# Patient Record
Sex: Female | Born: 1950
Health system: Southern US, Community
[De-identification: ages and names within clinical notes are randomized; demographics above are authoritative.]

## PROBLEM LIST (undated history)

## (undated) DIAGNOSIS — K5792 Diverticulitis of intestine, part unspecified, without perforation or abscess without bleeding: Secondary | ICD-10-CM

## (undated) DIAGNOSIS — M1990 Unspecified osteoarthritis, unspecified site: Secondary | ICD-10-CM

## (undated) DIAGNOSIS — R079 Chest pain, unspecified: Secondary | ICD-10-CM

## (undated) DIAGNOSIS — I447 Left bundle-branch block, unspecified: Secondary | ICD-10-CM

## (undated) DIAGNOSIS — T7840XA Allergy, unspecified, initial encounter: Secondary | ICD-10-CM

## (undated) DIAGNOSIS — M549 Dorsalgia, unspecified: Secondary | ICD-10-CM

## (undated) DIAGNOSIS — I2510 Atherosclerotic heart disease of native coronary artery without angina pectoris: Secondary | ICD-10-CM

## (undated) DIAGNOSIS — M160 Bilateral primary osteoarthritis of hip: Secondary | ICD-10-CM

## (undated) DIAGNOSIS — M2550 Pain in unspecified joint: Secondary | ICD-10-CM

## (undated) DIAGNOSIS — E7800 Pure hypercholesterolemia, unspecified: Secondary | ICD-10-CM

## (undated) DIAGNOSIS — I10 Essential (primary) hypertension: Secondary | ICD-10-CM

## (undated) DIAGNOSIS — Z8719 Personal history of other diseases of the digestive system: Secondary | ICD-10-CM

## (undated) HISTORY — PX: PARTIAL HYSTERECTOMY: SHX80

## (undated) HISTORY — DX: Allergy, unspecified, initial encounter: T78.40XA

## (undated) HISTORY — DX: Pure hypercholesterolemia, unspecified: E78.00

## (undated) HISTORY — DX: Atherosclerotic heart disease of native coronary artery without angina pectoris: I25.10

## (undated) HISTORY — PX: ROTATOR CUFF REPAIR: SHX139

## (undated) HISTORY — PX: FRACTURE SURGERY: SHX138

## (undated) HISTORY — DX: Chest pain, unspecified: R07.9

## (undated) HISTORY — PX: CHOLECYSTECTOMY: SHX55

## (undated) HISTORY — DX: Essential (primary) hypertension: I10

## (undated) HISTORY — DX: Pain in unspecified joint: M25.50

## (undated) HISTORY — DX: Unspecified osteoarthritis, unspecified site: M19.90

## (undated) HISTORY — DX: Dorsalgia, unspecified: M54.9

## (undated) HISTORY — PX: TONSILLECTOMY: SUR1361

## (undated) HISTORY — DX: Diverticulitis of intestine, part unspecified, without perforation or abscess without bleeding: K57.92

---

## 2000-12-26 ENCOUNTER — Encounter: Admission: RE | Admit: 2000-12-26 | Discharge: 2001-03-26 | Payer: Self-pay | Admitting: Family Medicine

## 2004-04-06 ENCOUNTER — Encounter: Admission: RE | Admit: 2004-04-06 | Discharge: 2004-04-06 | Payer: Self-pay | Admitting: Family Medicine

## 2005-02-05 ENCOUNTER — Ambulatory Visit: Payer: Self-pay | Admitting: Internal Medicine

## 2005-02-09 ENCOUNTER — Ambulatory Visit: Payer: Self-pay | Admitting: Family Medicine

## 2005-02-14 ENCOUNTER — Ambulatory Visit: Payer: Self-pay | Admitting: Family Medicine

## 2007-03-13 ENCOUNTER — Ambulatory Visit: Payer: Self-pay | Admitting: Family Medicine

## 2007-03-14 ENCOUNTER — Ambulatory Visit: Payer: Self-pay | Admitting: Internal Medicine

## 2007-03-14 ENCOUNTER — Ambulatory Visit: Payer: Self-pay | Admitting: Family Medicine

## 2007-03-14 LAB — CONVERTED CEMR LAB
ALT: 21 units/L (ref 0–35)
AST: 20 units/L (ref 0–37)
CO2: 30 meq/L (ref 19–32)
Chloride: 107 meq/L (ref 96–112)
Cholesterol: 180 mg/dL (ref 0–200)
Creatinine, Ser: 0.6 mg/dL (ref 0.4–1.2)
Eosinophils Absolute: 0.1 10*3/uL (ref 0.0–0.6)
Hemoglobin: 13.4 g/dL (ref 12.0–15.0)
Ketones, ur: NEGATIVE mg/dL
MCHC: 34.7 g/dL (ref 30.0–36.0)
Monocytes Absolute: 0.7 10*3/uL (ref 0.2–0.7)
Monocytes Relative: 13.9 % — ABNORMAL HIGH (ref 3.0–11.0)
Nitrite: NEGATIVE
Platelets: 163 10*3/uL (ref 150–400)
Potassium: 3.9 meq/L (ref 3.5–5.1)
RDW: 12 % (ref 11.5–14.6)
Specific Gravity, Urine: 1.01 (ref 1.000–1.03)
TSH: 0.96 microintl units/mL (ref 0.35–5.50)
Total Bilirubin: 1.2 mg/dL (ref 0.3–1.2)
Total Protein, Urine: NEGATIVE mg/dL
Triglycerides: 69 mg/dL (ref 0–149)
Urobilinogen, UA: 0.2 (ref 0.0–1.0)
WBC: 5.2 10*3/uL (ref 4.5–10.5)

## 2007-03-17 ENCOUNTER — Ambulatory Visit: Payer: Self-pay | Admitting: Family Medicine

## 2007-03-17 DIAGNOSIS — K5730 Diverticulosis of large intestine without perforation or abscess without bleeding: Secondary | ICD-10-CM

## 2007-03-24 DIAGNOSIS — I10 Essential (primary) hypertension: Secondary | ICD-10-CM | POA: Insufficient documentation

## 2007-04-01 ENCOUNTER — Ambulatory Visit: Payer: Self-pay | Admitting: Family Medicine

## 2007-04-01 DIAGNOSIS — R1011 Right upper quadrant pain: Secondary | ICD-10-CM

## 2007-04-16 ENCOUNTER — Telehealth (INDEPENDENT_AMBULATORY_CARE_PROVIDER_SITE_OTHER): Payer: Self-pay | Admitting: *Deleted

## 2007-04-16 ENCOUNTER — Telehealth: Payer: Self-pay | Admitting: Family Medicine

## 2007-04-21 ENCOUNTER — Telehealth: Payer: Self-pay | Admitting: Family Medicine

## 2007-06-27 ENCOUNTER — Encounter: Payer: Self-pay | Admitting: Family Medicine

## 2007-12-01 ENCOUNTER — Telehealth (INDEPENDENT_AMBULATORY_CARE_PROVIDER_SITE_OTHER): Payer: Self-pay | Admitting: *Deleted

## 2008-02-25 ENCOUNTER — Ambulatory Visit: Payer: Self-pay | Admitting: Family Medicine

## 2008-02-25 LAB — CONVERTED CEMR LAB
ALT: 22 units/L (ref 0–35)
AST: 20 units/L (ref 0–37)
Albumin: 4.3 g/dL (ref 3.5–5.2)
Alkaline Phosphatase: 107 units/L (ref 39–117)
BUN: 19 mg/dL (ref 6–23)
Basophils Relative: 0.3 % (ref 0.0–1.0)
Bilirubin, Direct: 0.1 mg/dL (ref 0.0–0.3)
CO2: 28 meq/L (ref 19–32)
Calcium: 9.4 mg/dL (ref 8.4–10.5)
Creatinine, Ser: 0.8 mg/dL (ref 0.4–1.2)
Eosinophils Absolute: 0 10*3/uL (ref 0.0–0.7)
GFR calc non Af Amer: 79 mL/min
HDL: 47.8 mg/dL (ref >39.0)
Hemoglobin: 13.9 g/dL (ref 12.0–15.0)
Nitrite: NEGATIVE
Platelets: 160 10*3/uL (ref 150–400)
Protein, U semiquant: NEGATIVE
RBC: 4.41 M/uL (ref 3.87–5.11)
Specific Gravity, Urine: 1.025
Total Bilirubin: 1 mg/dL (ref 0.3–1.2)
Urobilinogen, UA: 0.2
VLDL: 13 mg/dL (ref 0–40)
pH: 5

## 2008-03-09 ENCOUNTER — Ambulatory Visit: Payer: Self-pay | Admitting: Family Medicine

## 2008-07-28 ENCOUNTER — Telehealth: Payer: Self-pay | Admitting: Family Medicine

## 2009-01-06 ENCOUNTER — Telehealth: Payer: Self-pay | Admitting: Family Medicine

## 2009-03-04 ENCOUNTER — Ambulatory Visit: Payer: Self-pay | Admitting: Family Medicine

## 2009-03-04 LAB — CONVERTED CEMR LAB
ALT: 20 units/L (ref 0–35)
Albumin: 4 g/dL (ref 3.5–5.2)
BUN: 13 mg/dL (ref 6–23)
Basophils Relative: 0.4 % (ref 0.0–3.0)
Bilirubin Urine: NEGATIVE
Blood in Urine, dipstick: NEGATIVE
Direct LDL: 127.3 mg/dL
Eosinophils Absolute: 0 10*3/uL (ref 0.0–0.7)
Eosinophils Relative: 1.1 % (ref 0.0–5.0)
GFR calc non Af Amer: 91.58 mL/min (ref >60)
Glucose, Bld: 89 mg/dL (ref 70–99)
Glucose, Urine, Semiquant: NEGATIVE
HDL: 53.4 mg/dL (ref >39.00)
Lymphocytes Relative: 48.6 % — ABNORMAL HIGH (ref 12.0–46.0)
Lymphs Abs: 1.8 10*3/uL (ref 0.7–4.0)
MCHC: 35 g/dL (ref 30.0–36.0)
Monocytes Relative: 12.7 % — ABNORMAL HIGH (ref 3.0–12.0)
Neutrophils Relative %: 37.2 % — ABNORMAL LOW (ref 43.0–77.0)
Nitrite: NEGATIVE
RDW: 11.7 % (ref 11.5–14.6)
TSH: 0.59 microintl units/mL (ref 0.35–5.50)
Total Bilirubin: 0.9 mg/dL (ref 0.3–1.2)
Total CHOL/HDL Ratio: 4
Urobilinogen, UA: 0.2

## 2009-03-11 ENCOUNTER — Ambulatory Visit: Payer: Self-pay | Admitting: Family Medicine

## 2009-03-28 ENCOUNTER — Encounter: Payer: Self-pay | Admitting: Family Medicine

## 2010-03-10 ENCOUNTER — Ambulatory Visit: Payer: Self-pay | Admitting: Family Medicine

## 2010-03-10 LAB — CONVERTED CEMR LAB
ALT: 20 units/L (ref 0–35)
Albumin: 4.3 g/dL (ref 3.5–5.2)
Bilirubin, Direct: 0.1 mg/dL (ref 0.0–0.3)
CO2: 29 meq/L (ref 19–32)
Calcium: 9.2 mg/dL (ref 8.4–10.5)
Cholesterol: 203 mg/dL — ABNORMAL HIGH (ref 0–200)
Eosinophils Absolute: 0.1 10*3/uL (ref 0.0–0.7)
GFR calc non Af Amer: 115.67 mL/min (ref >60)
Glucose, Bld: 92 mg/dL (ref 70–99)
Ketones, urine, test strip: NEGATIVE
Lymphs Abs: 2.3 10*3/uL (ref 0.7–4.0)
MCV: 91.2 fL (ref 78.0–100.0)
Monocytes Absolute: 0.5 10*3/uL (ref 0.1–1.0)
Platelets: 146 10*3/uL — ABNORMAL LOW (ref 150.0–400.0)
Potassium: 4.3 meq/L (ref 3.5–5.1)
Total Bilirubin: 0.6 mg/dL (ref 0.3–1.2)
Total CHOL/HDL Ratio: 4
Total Protein: 7 g/dL (ref 6.0–8.3)
VLDL: 28.6 mg/dL (ref 0.0–40.0)
WBC: 4.4 10*3/uL — ABNORMAL LOW (ref 4.5–10.5)
pH: 6.5

## 2010-03-17 ENCOUNTER — Ambulatory Visit: Payer: Self-pay | Admitting: Family Medicine

## 2010-03-23 ENCOUNTER — Ambulatory Visit: Payer: Self-pay | Admitting: Internal Medicine

## 2010-03-23 ENCOUNTER — Encounter: Payer: Self-pay | Admitting: Family Medicine

## 2010-04-18 ENCOUNTER — Encounter: Payer: Self-pay | Admitting: Family Medicine

## 2010-04-20 ENCOUNTER — Encounter: Payer: Self-pay | Admitting: Family Medicine

## 2010-06-06 ENCOUNTER — Telehealth: Payer: Self-pay | Admitting: Family Medicine

## 2010-06-14 ENCOUNTER — Telehealth: Payer: Self-pay | Admitting: Family Medicine

## 2010-09-17 ENCOUNTER — Encounter: Payer: Self-pay | Admitting: Family Medicine

## 2010-09-26 NOTE — Progress Notes (Signed)
Summary: Refills?  Phone Note Outgoing Call Call back at [facility] [phone] Call back at Work Phone [phone]   Call placed by: Mervin Hack CMA Duncan Dull),  June 06, 2010 2:21 PM Call placed to: Patient Summary of Call: Calling pt to advise that she got refills on 04/10/10 and shouldn't need refills. Rec'd [facility] refills today for Amlodipine Besylate, Ramipril, & potassium.  Initial call taken by: Mervin Hack CMA Duncan Dull),  June 06, 2010 2:23 PM  Follow-up for Phone Call        left message on machine at home for patient to return my call. left message at work number for patient to return my call  DeShannon Katrinka Blazing CMA Duncan Dull)  June 06, 2010 2:23 PM    spoke with pt and per her insurance, she must get her long term medication refills thru [facility], only short term medications can go locally. Ok to refill? DeShannon Katrinka Blazing CMA Duncan Dull)  June 06, 2010 5:17 PM  Follow-up by: Roderick Pee MD,  June 06, 2010 5:36 PM  Additional Follow-up for Phone Call Additional follow up Details #1::        ok Additional Follow-up by: Roderick Pee MD,  June 06, 2010 5:36 PM    Prescriptions: POTASSIUM CHLORIDE 20 MEQ  PACK (POTASSIUM CHLORIDE) Take 1 tablet by mouth every morning  #100 Each x 2   Entered by:   Lynann Beaver CMA   Authorized by:   Roderick Pee MD   Signed by:   Lynann Beaver CMA on 06/08/2010   Method used:   Faxed to ...       [facility] (mail-order)             , [place]         Ph: [phone]       Fax: [phone]   RxID:   [id number] NORVASC 5 MG  TABS (AMLODIPINE BESYLATE) Take one tablet by mouth daily  #100 Each x 2   Entered by:   Lynann Beaver CMA   Authorized by:   Roderick Pee MD   Signed by:   Lynann Beaver CMA on 06/08/2010   Method used:   Faxed to ...       [facility] (mail-order)             , [place]         Ph: [phone]       Fax: [phone]   RxID:   [id number] RAMIPRIL 10 MG CAPS (RAMIPRIL) take one tab by mouth once daily   #100 x 3   Entered by:   Lynann Beaver CMA   Authorized by:   Roderick Pee MD   Signed by:   Lynann Beaver CMA on 06/08/2010   Method used:   Faxed to ...       [facility] (mail-order)             , [place]         Ph: [phone]       Fax: [phone]   RxID:   [id number]

## 2010-09-26 NOTE — Assessment & Plan Note (Signed)
Summary: cpx/pap/njr   Vital Signs:  Patient profile:   60 year old female Menstrual status:  hysterectomy Height:      60.75 inches Weight:      156 pounds BMI:     29.83 Temp:     98.3 degrees F oral BP sitting:   124 / 80  (left arm) Cuff size:   regular  Vitals Entered By: Kern Reap CMA Duncan Dull) (March 17, 2010 11:24 AM) CC: cpx   CC:  cpx.  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  Her hypertension is treated with Norvasc 5 mg daily, Altace, 10 mg daily, potassium 20 mEq daily, BP 124/80.  She uses Premarin vaginal cream p.r.n. for vaginal dryness.  Advised her to use it once or twice weekly.  She also takes Zovirax p.r.n. for flareup of HSV.  She gets routine eye care, dental care, does BSE monthly, annual mammography, colonoscopy, normal, tetanus, 2009, seasonal flu 2010.  She had surgery on her left shoulder last year at rotator cuff doing well.  Allergies: 1)  ! Pcn  Past History:  Past medical, surgical, family and social histories (including risk factors) reviewed, and no changes noted (except as noted below).  Past Medical History: Reviewed history from 03/09/2008 and no changes required. Hypertension C-sections x 2 uterus removed, and right ovary removed for nonmalignant reasons .  Septoplastyas an outpatient migraine headaches gone now seasonal allergic rhinitis stress fracture, right foot, 2008  Past Surgical History: Reviewed history from 03/24/2007 and no changes required. CB x 2 TAH-Rt overy Colonoscopy-2005  Family History: Reviewed history from 03/24/2007 and no changes required. Family History Depression Family History Diabetes 1st degree relative Family History Hypertension Family History Other cancer-Breast  Social History: Reviewed history from 03/09/2008 and no changes required. Never  Smoked Occupation:  Charity fundraiser Married Alcohol use-no Drug use-no Regular exercise-yes  Review of Systems      See HPI  Physical Exam  General:  Well-developed,well-nourished,in no acute distress; alert,appropriate and cooperative throughout examination Head:  Normocephalic and atraumatic without obvious abnormalities. No apparent alopecia or balding. Eyes:  No corneal or conjunctival inflammation noted. EOMI. Perrla. Funduscopic exam benign, without hemorrhages, exudates or papilledema. Vision grossly normal. Ears:  External ear exam shows no significant lesions or deformities.  Otoscopic examination reveals clear canals, tympanic membranes are intact bilaterally without bulging, retraction, inflammation or discharge. Hearing is grossly normal bilaterally. Nose:  External nasal examination shows no deformity or inflammation. Nasal mucosa are pink and moist without lesions or exudates. Mouth:  Oral mucosa and oropharynx without lesions or exudates.  Teeth in good repair. Neck:  No deformities, masses, or tenderness noted. Chest Wall:  No deformities, masses, or tenderness noted. Breasts:  No mass, nodules, thickening, tenderness, bulging, retraction, inflamation, nipple discharge or skin changes noted.   Lungs:  Normal respiratory effort, chest expands symmetrically. Lungs are clear to auscultation, no crackles or wheezes. Heart:  Normal rate and regular rhythm. S1 and S2 normal without gallop, murmur, click, rub or other extra sounds. Abdomen:  Bowel sounds positive,abdomen soft and non-tender without masses, organomegaly or hernias noted. Rectal:  No external abnormalities noted. Normal sphincter tone. No rectal masses or tenderness. Genitalia:  Pelvic Exam:        External: normal female genitalia without lesions or masses        Vagina: normal without lesions or masses  Cervix: normal without lesions or masses        Adnexa: normal bimanual exam without masses or fullness         Uterus: normal by palpation        Pap smear: not performed Msk:  No deformity or scoliosis noted of thoracic or lumbar spine.   Pulses:  R and L carotid,radial,femoral,dorsalis pedis and posterior tibial pulses are full and equal bilaterally Extremities:  No clubbing, cyanosis, edema, or deformity noted with normal full range of motion of all joints.   Neurologic:  No cranial nerve deficits noted. Station and gait are normal. Plantar reflexes are down-going bilaterally. DTRs are symmetrical throughout. Sensory, motor and coordinative functions appear intact. Skin:  Intact without suspicious lesions or rashes Cervical Nodes:  No lymphadenopathy noted Axillary Nodes:  No palpable lymphadenopathy Inguinal Nodes:  No significant adenopathy Psych:  Cognition and judgment appear intact. Alert and cooperative with normal attention span and concentration. No apparent delusions, illusions, hallucinations   Impression & Recommendations:  Problem # 1:  PHYSICAL EXAMINATION (ICD-V70.0) Assessment Unchanged  Orders: Prescription Created Electronically [phone]) EKG w/ Interpretation (93000)  Problem # 2:  HYPERTENSION (ICD-401.9) Assessment: Improved  Her updated medication list for this problem includes:    Norvasc 5 Mg Tabs (Amlodipine besylate) .Marland Kitchen... Take one tablet by mouth daily    Altace 10 Mg Tabs (Ramipril) .Marland Kitchen... Takeone tablet by mouth daily  Orders: Prescription Created Electronically [phone]) EKG w/ Interpretation (93000)  Complete Medication List: 1)  Norvasc 5 Mg Tabs (Amlodipine besylate) .... Take one tablet by mouth daily 2)  Altace 10 Mg Tabs (Ramipril) .... Takeone tablet by mouth daily 3)  Adult Aspirin Ec Low Strength 81 Mg Tbec (Aspirin) .... Take one tablet by mouth daily 4)  Zyrtec Allergy 10 Mg Tabs (Cetirizine hcl) .... Take one tablet by mouth q 12 hours prn 5)  Calcium 600/vitamin D 600-200 Mg-unit Tabs (Calcium carbonate-vitamin d) .... One by mouth daily 6)   Omega-3 350 Mg Caps (Omega-3 fatty acids) .... Once daily 7)  Premarin 0.625 Mg/gm Crea (Estrogens, conjugated) .... Apply weekly 8)  Potassium Chloride 20 Meq Pack (Potassium chloride) .... Take 1 tablet by mouth every morning 9)  Valtrex 1 Gm Tabs (Valacyclovir hcl) .... Take one tab every morning or as directed by your doctor 10)  Zovirax 200 Mg Caps (Acyclovir) .... 3 in am, 2 in pm as needed  Other Orders: T-Bone Densitometry (62130)  Patient Instructions: 1)  Please schedule a follow-up appointment in 1 year. 2)  It is important that you exercise regularly at least 20 minutes 5 times a week. If you develop chest pain, have severe difficulty breathing, or feel very tired , stop exercising immediately and seek medical attention. 3)  Schedule your mammogram. 4)  Schedule a colonoscopy/sigmoidoscopy to help detect colon cancer. 5)  Take calcium +Vitamin D daily. 6)  Take an Aspirin every day. Prescriptions: PREMARIN 0.625 MG/GM  CREA (ESTROGENS, CONJUGATED) apply weekly  #3 tubes x 6   Entered and Authorized by:   Roderick Pee MD   Signed by:   Roderick Pee MD on 03/17/2010   Method used:   Electronically to        [facility] [address] [phone]* (retail)       [address]       [place], [place]  [address]       Ph: [phone] or [phone]  Fax: [phone]   RxID:   [id number] ALTACE 10 MG  TABS (RAMIPRIL) Takeone tablet by mouth daily  #100.0 Each x 3   Entered and Authorized by:   Roderick Pee MD   Signed by:   Roderick Pee MD on 03/17/2010   Method used:   Electronically to        [facility] [address] [phone]* (retail)       [address]       [place], [place]  [address]       Ph: [phone] or [phone]       Fax: [phone]   RxID:   [id number] NORVASC 5 MG  TABS (AMLODIPINE BESYLATE) Take one tablet by mouth daily  #100.0 Each x 3   Entered and Authorized by:   Roderick Pee MD   Signed by:    Roderick Pee MD on 03/17/2010   Method used:   Electronically to        [facility] [address] [phone]* (retail)       [address]       [place], [place]  [address]       Ph: [phone] or [phone]       Fax: [phone]   RxID:   [id number]    Immunization History:  Influenza Immunization History:    Influenza:  historical (05/27/2009)

## 2010-09-26 NOTE — Miscellaneous (Signed)
Summary: BONE DENSITY  Clinical Lists Changes  Orders: Added new Test order of T-Lumbar Vertebral Assessment (77082) - Signed 

## 2010-09-26 NOTE — Miscellaneous (Signed)
Summary: mammogram update   Clinical Lists Changes  Observations: Added new observation of MAMMO DUE: 04/2011 (04/20/2010 11:22) Added new observation of MAMMOGRAM: normal (04/18/2010 11:23)      Preventive Care Screening  Mammogram:    Date:  04/18/2010    Next Due:  04/2011    Results:  normal

## 2010-09-26 NOTE — Progress Notes (Signed)
Summary: please resend rxs to [facility]  Phone Note Refill Request Call back at Work Phone [phone] Message from:  Cheryl Barnes---voice mail  Refills Requested: Medication #1:  POTASSIUM CHLORIDE 20 MEQ  PACK Take 1 tablet by mouth every morning  Medication #2:  RAMIPRIL 10 MG CAPS take one tab by mouth once daily.   Brand Name Necessary? Yes  Medication #3:  NORVASC 5 MG  TABS Take one tablet by mouth daily   Brand Name Necessary? Yes  resend to [facility].  Initial call taken by: Warnell Forester,  June 14, 2010 10:22 AM Caller: Cheryl Barnes---voice mail    Prescriptions: RAMIPRIL 10 MG CAPS (RAMIPRIL) take one tab by mouth once daily  #100 x 2   Entered by:   Kern Reap CMA (AAMA)   Authorized by:   Roderick Pee MD   Signed by:   Kern Reap CMA (AAMA) on 06/14/2010   Method used:   Faxed to ...       [facility] (mail-[facility]             , [place]         Ph: [phone]       Fax: [phone]   RxID:   [id number] POTASSIUM CHLORIDE 20 MEQ  PACK (POTASSIUM CHLORIDE) Take 1 tablet by mouth every morning  #100 Each x 2   Entered by:   Kern Reap CMA (AAMA)   Authorized by:   Roderick Pee MD   Signed by:   Kern Reap CMA (AAMA) on 06/14/2010   Method used:   Faxed to ...       [facility] (mail-order)             , [place]         Ph: [phone]       Fax: [phone]   RxID:   [id number] NORVASC 5 MG  TABS (AMLODIPINE BESYLATE) Take one tablet by mouth daily  #100 Each x 2   Entered by:   Kern Reap CMA (AAMA)   Authorized by:   Roderick Pee MD   Signed by:   Kern Reap CMA (AAMA) on 06/14/2010   Method used:   Faxed to ...       [facility] (mail-order)             , [place]         Ph: [phone]       Fax: [phone]   RxID:   [id number]

## 2011-03-31 ENCOUNTER — Other Ambulatory Visit: Payer: Self-pay | Admitting: Family Medicine

## 2011-08-13 ENCOUNTER — Telehealth: Payer: Self-pay | Admitting: Family Medicine

## 2011-08-13 NOTE — Telephone Encounter (Signed)
Left message on machine for patient to let us know what labs she is requesting

## 2011-08-13 NOTE — Telephone Encounter (Signed)
Does pt need to come in one week prior for lab work for her med check scheduled for 09/04/11? Please advise. If so what blood work

## 2011-08-13 NOTE — Telephone Encounter (Signed)
Cheryl Barnes please call to find out what the issue is

## 2011-08-14 NOTE — Telephone Encounter (Signed)
Pt will come in for a cpx instead. Thanks.

## 2011-08-17 ENCOUNTER — Other Ambulatory Visit: Payer: Self-pay | Admitting: Family Medicine

## 2011-08-22 ENCOUNTER — Other Ambulatory Visit (INDEPENDENT_AMBULATORY_CARE_PROVIDER_SITE_OTHER): Payer: BC Managed Care – PPO

## 2011-08-22 DIAGNOSIS — Z0000 Encounter for general adult medical examination without abnormal findings: Secondary | ICD-10-CM

## 2011-08-22 LAB — CBC WITH DIFFERENTIAL/PLATELET
Basophils Absolute: 0 10*3/uL (ref 0.0–0.1)
Eosinophils Absolute: 0 10*3/uL (ref 0.0–0.7)
Eosinophils Relative: 0.9 % (ref 0.0–5.0)
Lymphocytes Relative: 53.1 % — ABNORMAL HIGH (ref 12.0–46.0)
MCHC: 34.5 g/dL (ref 30.0–36.0)
MCV: 90.4 fl (ref 78.0–100.0)
RBC: 4.51 Mil/uL (ref 3.87–5.11)
WBC: 4.7 10*3/uL (ref 4.5–10.5)

## 2011-08-22 LAB — HEPATIC FUNCTION PANEL
AST: 20 U/L (ref 0–37)
Alkaline Phosphatase: 93 U/L (ref 39–117)
Bilirubin, Direct: 0 mg/dL (ref 0.0–0.3)
Total Bilirubin: 0.6 mg/dL (ref 0.3–1.2)

## 2011-08-22 LAB — POCT URINALYSIS DIPSTICK
Blood, UA: NEGATIVE
Ketones, UA: NEGATIVE
Protein, UA: NEGATIVE

## 2011-08-22 LAB — LIPID PANEL
HDL: 58.6 mg/dL (ref >39.00)
VLDL: 16.8 mg/dL (ref 0.0–40.0)

## 2011-08-22 LAB — LDL CHOLESTEROL, DIRECT: Direct LDL: 120.6 mg/dL

## 2011-08-22 LAB — BASIC METABOLIC PANEL
BUN: 15 mg/dL (ref 6–23)
Calcium: 9.1 mg/dL (ref 8.4–10.5)
Creatinine, Ser: 0.7 mg/dL (ref 0.4–1.2)
Potassium: 4 mEq/L (ref 3.5–5.1)
Sodium: 143 mEq/L (ref 135–145)

## 2011-08-22 LAB — TSH: TSH: 0.37 u[IU]/mL (ref 0.35–5.50)

## 2011-09-01 ENCOUNTER — Other Ambulatory Visit: Payer: Self-pay | Admitting: Family Medicine

## 2011-09-04 ENCOUNTER — Ambulatory Visit: Payer: Self-pay | Admitting: Family Medicine

## 2011-09-06 ENCOUNTER — Other Ambulatory Visit: Payer: Self-pay | Admitting: Family Medicine

## 2011-10-09 ENCOUNTER — Encounter: Payer: Self-pay | Admitting: Family Medicine

## 2011-10-19 ENCOUNTER — Encounter: Payer: Self-pay | Admitting: Family Medicine

## 2011-10-22 ENCOUNTER — Other Ambulatory Visit (HOSPITAL_COMMUNITY)
Admission: RE | Admit: 2011-10-22 | Discharge: 2011-10-22 | Disposition: A | Discharge status: Home / Self Care | Payer: BC Managed Care – PPO | Source: Ambulatory Visit | Attending: Family Medicine | Admitting: Family Medicine

## 2011-10-22 ENCOUNTER — Ambulatory Visit (INDEPENDENT_AMBULATORY_CARE_PROVIDER_SITE_OTHER): Payer: BC Managed Care – PPO | Admitting: Family Medicine

## 2011-10-22 ENCOUNTER — Encounter: Payer: Self-pay | Admitting: Family Medicine

## 2011-10-22 DIAGNOSIS — Z124 Encounter for screening for malignant neoplasm of cervix: Secondary | ICD-10-CM | POA: Insufficient documentation

## 2011-10-22 DIAGNOSIS — Z01419 Encounter for gynecological examination (general) (routine) without abnormal findings: Secondary | ICD-10-CM

## 2011-10-22 DIAGNOSIS — I10 Essential (primary) hypertension: Secondary | ICD-10-CM

## 2011-10-22 MED ORDER — AMLODIPINE BESYLATE 5 MG PO TABS: 5.0000 mg | ORAL_TABLET | Freq: Every day | ORAL | Status: DC

## 2011-10-22 MED ORDER — POTASSIUM CHLORIDE CRYS ER 20 MEQ PO TBCR: 20.0000 meq | EXTENDED_RELEASE_TABLET | Freq: Every day | ORAL | Status: DC

## 2011-10-22 MED ORDER — RAMIPRIL 10 MG PO CAPS: 10.0000 mg | ORAL_CAPSULE | Freq: Every day | ORAL | Status: DC

## 2011-10-22 NOTE — Patient Instructions (Signed)
Continue your current medication  Followup in 1 year sooner if any problems 

## 2011-10-22 NOTE — Progress Notes (Signed)
  Subjective:    Patient ID: Cheryl Barnes, female    DOB: 1950-09-21, 61 y.o.   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 She does have light skin and light eyes and will do a thorough skin exam. Seasonal flu shot 2012    Review of Systems  Constitutional: Negative.   HENT: Negative.   Eyes: Negative.   Respiratory: Negative.   Cardiovascular: Negative.   Gastrointestinal: Negative.   Genitourinary: Negative.   Musculoskeletal: Negative.   Neurological: Negative.   Hematological: Negative.   Psychiatric/Behavioral: Negative.        Objective:   Physical Exam  Constitutional: She appears well-developed and well-nourished.  HENT:  Head: Normocephalic and atraumatic.  Right Ear: External ear normal.  Left Ear: External ear normal.  Nose: Nose normal.  Mouth/Throat: Oropharynx is clear and moist.  Eyes: EOM are normal. Pupils are equal, round, and reactive to light.  Neck: Normal range of motion. Neck supple. No thyromegaly present.  Cardiovascular: Normal rate, regular rhythm, normal heart sounds and intact distal pulses.  Exam reveals no gallop and no friction rub.   No murmur heard. Pulmonary/Chest: Effort normal and breath sounds normal.  Abdominal: Soft. Bowel sounds are normal. She exhibits no distension and no mass. There is no tenderness. There is no rebound.  Genitourinary: Vagina normal and uterus normal. Guaiac negative stool. No vaginal discharge found.       Bilateral breast exam normal  Musculoskeletal: Normal range of motion.  Lymphadenopathy:    She has no cervical adenopathy.  Neurological: She is alert. She  has normal reflexes. No cranial nerve deficit. She exhibits normal muscle tone. Coordination normal.  Skin: Skin is warm and dry.       Total body skin exam normal except for a red lesion in her left groin. She thinks it's a hair follicle the got inflamed return if the redness does not abate  Psychiatric: She has a normal mood and affect. Her behavior is normal. Judgment and thought content normal.          Assessment & Plan:  Healthy female  Hypertension continue current meds followup in 1 year sooner if any problems

## 2012-01-11 ENCOUNTER — Other Ambulatory Visit: Payer: Self-pay | Admitting: Family Medicine

## 2012-04-26 ENCOUNTER — Other Ambulatory Visit: Payer: Self-pay | Admitting: Family Medicine

## 2012-04-29 ENCOUNTER — Other Ambulatory Visit: Payer: Self-pay | Admitting: *Deleted

## 2012-04-29 DIAGNOSIS — I10 Essential (primary) hypertension: Secondary | ICD-10-CM

## 2012-04-29 MED ORDER — POTASSIUM CHLORIDE CRYS ER 20 MEQ PO TBCR: 20.0000 meq | EXTENDED_RELEASE_TABLET | Freq: Every day | ORAL | Status: DC

## 2012-12-10 ENCOUNTER — Telehealth: Payer: Self-pay | Admitting: Family Medicine

## 2012-12-10 DIAGNOSIS — I10 Essential (primary) hypertension: Secondary | ICD-10-CM

## 2012-12-10 MED ORDER — POTASSIUM CHLORIDE CRYS ER 20 MEQ PO TBCR: EXTENDED_RELEASE_TABLET | ORAL | Status: DC

## 2012-12-10 MED ORDER — RAMIPRIL 10 MG PO CAPS: 10.0000 mg | ORAL_CAPSULE | Freq: Every day | ORAL | Status: DC

## 2012-12-10 MED ORDER — AMLODIPINE BESYLATE 5 MG PO TABS: 5.0000 mg | ORAL_TABLET | Freq: Every day | ORAL | Status: DC

## 2012-12-10 MED ORDER — VALACYCLOVIR HCL 1 G PO TABS: ORAL_TABLET | ORAL | Status: DC

## 2012-12-10 NOTE — Telephone Encounter (Signed)
Pt has cpx labs and cpx on 6/30 & 7/7. She needs refills, however. She will be out of her medicines within 1 week. Please refill the following:  amLODipine (NORVASC) 5 MG tablet ramipril (ALTACE) 10 MG capsule valACYclovir (VALTREX) 1000 MG tablet  Pt has enough of the K+ to last until cpx.  Please send above rx to [facility] on [address]. When she comes in for cpx, will resume mail order pharm at that time.

## 2012-12-10 NOTE — Telephone Encounter (Signed)
Rx sent to pharmacy   

## 2013-02-23 ENCOUNTER — Other Ambulatory Visit (INDEPENDENT_AMBULATORY_CARE_PROVIDER_SITE_OTHER): Payer: BC Managed Care – PPO

## 2013-02-23 ENCOUNTER — Encounter: Payer: Self-pay | Admitting: Family Medicine

## 2013-02-23 DIAGNOSIS — Z0000 Encounter for general adult medical examination without abnormal findings: Secondary | ICD-10-CM

## 2013-02-23 LAB — LIPID PANEL
HDL: 48.7 mg/dL (ref >39.00)
LDL Cholesterol: 116 mg/dL — ABNORMAL HIGH (ref 0–99)
Total CHOL/HDL Ratio: 4
Triglycerides: 103 mg/dL (ref 0.0–149.0)

## 2013-02-23 LAB — CBC WITH DIFFERENTIAL/PLATELET
Basophils Absolute: 0 10*3/uL (ref 0.0–0.1)
Hemoglobin: 13.9 g/dL (ref 12.0–15.0)
Lymphocytes Relative: 14.4 % (ref 12.0–46.0)
Monocytes Relative: 9 % (ref 3.0–12.0)
Neutro Abs: 6.9 10*3/uL (ref 1.4–7.7)
RBC: 4.43 Mil/uL (ref 3.87–5.11)
RDW: 12.9 % (ref 11.5–14.6)
WBC: 9.2 10*3/uL (ref 4.5–10.5)

## 2013-02-23 LAB — BASIC METABOLIC PANEL
CO2: 26 mEq/L (ref 19–32)
Chloride: 112 mEq/L (ref 96–112)
Creatinine, Ser: 0.6 mg/dL (ref 0.4–1.2)

## 2013-02-23 LAB — HEPATIC FUNCTION PANEL
Albumin: 4.1 g/dL (ref 3.5–5.2)
Total Bilirubin: 0.7 mg/dL (ref 0.3–1.2)

## 2013-02-23 LAB — POCT URINALYSIS DIPSTICK
Blood, UA: NEGATIVE
Glucose, UA: NEGATIVE
Nitrite, UA: NEGATIVE
Protein, UA: NEGATIVE
Urobilinogen, UA: 0.2
pH, UA: 5

## 2013-02-23 LAB — TSH: TSH: 0.54 u[IU]/mL (ref 0.35–5.50)

## 2013-03-02 ENCOUNTER — Ambulatory Visit (INDEPENDENT_AMBULATORY_CARE_PROVIDER_SITE_OTHER): Payer: BC Managed Care – PPO | Admitting: Family Medicine

## 2013-03-02 ENCOUNTER — Encounter: Payer: Self-pay | Admitting: Family Medicine

## 2013-03-02 VITALS — BP 120/80 | Temp 98.2°F | Ht 61.0 in | Wt 159.0 lb

## 2013-03-02 DIAGNOSIS — B009 Herpesviral infection, unspecified: Secondary | ICD-10-CM

## 2013-03-02 DIAGNOSIS — R32 Unspecified urinary incontinence: Secondary | ICD-10-CM | POA: Insufficient documentation

## 2013-03-02 DIAGNOSIS — I10 Essential (primary) hypertension: Secondary | ICD-10-CM

## 2013-03-02 DIAGNOSIS — K5732 Diverticulitis of large intestine without perforation or abscess without bleeding: Secondary | ICD-10-CM

## 2013-03-02 DIAGNOSIS — N952 Postmenopausal atrophic vaginitis: Secondary | ICD-10-CM

## 2013-03-02 DIAGNOSIS — R1011 Right upper quadrant pain: Secondary | ICD-10-CM

## 2013-03-02 MED ORDER — POTASSIUM CHLORIDE CRYS ER 20 MEQ PO TBCR: EXTENDED_RELEASE_TABLET | ORAL | Status: DC

## 2013-03-02 MED ORDER — ESTRADIOL 0.1 MG/GM VA CREA: 2.0000 g | TOPICAL_CREAM | Freq: Every day | VAGINAL | Status: DC

## 2013-03-02 MED ORDER — VALACYCLOVIR HCL 1 G PO TABS: ORAL_TABLET | ORAL | Status: DC

## 2013-03-02 MED ORDER — AMLODIPINE BESYLATE 5 MG PO TABS: 5.0000 mg | ORAL_TABLET | Freq: Every day | ORAL | Status: DC

## 2013-03-02 MED ORDER — RAMIPRIL 10 MG PO CAPS: 10.0000 mg | ORAL_CAPSULE | Freq: Every day | ORAL | Status: DC

## 2013-03-02 NOTE — Progress Notes (Signed)
  Subjective:    Patient ID: Cheryl Barnes, female    DOB: April 26, 1951, 62 y.o.   MRN: [id number]  HPI  Cheryl Barnes is a 62 year old married female nonsmoker who comes in today for general physical examination because of a history of hypertension, occasional HSV 1,,,,,,,, only once this year,,,,,,, and a new problem of increasing urinary incontinence  She gets routine eye care, dental care, BSE monthly, and you mammography, colonoscopy and GI shows diverticuli.  Vaccinations up-to-date except she still shingles vexing  Her urinary continence seems to be getting worse. She had her uterus removed mid 30s for bleeding and one ovary. The left ovary which was left intact has served her well through the years. She has no menopausal symptoms when she went through menopause around age 11. She's noticed increasing vaginal dryness and urinary leakage. At one time she did use some hormonal cream it helped but she was concerned about using it because her mother had a history of breast cancer. However her mother had been on long-term oral hormones for many many years  Review of Systems  Constitutional: Negative.   HENT: Negative.   Eyes: Negative.   Respiratory: Negative.   Cardiovascular: Negative.   Gastrointestinal: Negative.   Genitourinary: Negative.   Musculoskeletal: Negative.   Neurological: Negative.   Psychiatric/Behavioral: Negative.        Objective:   Physical Exam  Constitutional: She appears well-developed and well-nourished.  HENT:  Head: Normocephalic and atraumatic.  Right Ear: External ear normal.  Left Ear: External ear normal.  Nose: Nose normal.  Mouth/Throat: Oropharynx is clear and moist.  Eyes: EOM are normal. Pupils are equal, round, and reactive to light.  Neck: Normal range of motion. Neck supple. No thyromegaly present.  Cardiovascular: Normal rate, regular rhythm, normal heart sounds and intact distal pulses.  Exam reveals no gallop and no friction rub.   No murmur  heard. No carotid or aortic bruits peripheral pulses 2+ and symmetrical  Pulmonary/Chest: Effort normal and breath sounds normal.  Abdominal: Soft. Bowel sounds are normal. She exhibits no distension and no mass. There is no tenderness. There is no rebound.  Genitourinary: Vagina normal. Guaiac negative stool. No vaginal discharge found.  Bilateral breast exam normal no evidence of any bladder prolapse no palpable pelvic masses  Musculoskeletal: Normal range of motion.  Lymphadenopathy:    She has no cervical adenopathy.  Neurological: She is alert. She has normal reflexes. No cranial nerve deficit. She exhibits normal muscle tone. Coordination normal.  Skin: Skin is warm and dry.  Total body skin exam normal  Psychiatric: She has a normal mood and affect. Her behavior is normal. Judgment and thought content normal.          Assessment & Plan:  Healthy female  Hypertension at goal continue current therapy  6 history of occasional HSV 1 Valtrex when necessary  Urinary incontinence begin exercise program and hormonal cream

## 2013-03-02 NOTE — Patient Instructions (Signed)
Use small amounts of the hormonal cream 3 times weekly  Cagle exercises 50 twice daily  Continue other medications  Return in one year sooner if any problems

## 2013-05-26 ENCOUNTER — Other Ambulatory Visit: Payer: Self-pay | Admitting: Family Medicine

## 2013-07-02 ENCOUNTER — Other Ambulatory Visit: Payer: Self-pay

## 2013-09-30 ENCOUNTER — Emergency Department (HOSPITAL_COMMUNITY)
Admission: EM | Admit: 2013-09-30 | Discharge: 2013-09-30 | Disposition: A | Discharge status: Home / Self Care | Payer: BC Managed Care – PPO | Attending: Emergency Medicine | Admitting: Emergency Medicine

## 2013-09-30 ENCOUNTER — Emergency Department (HOSPITAL_COMMUNITY): Payer: BC Managed Care – PPO

## 2013-09-30 ENCOUNTER — Encounter (HOSPITAL_COMMUNITY): Payer: Self-pay | Admitting: Emergency Medicine

## 2013-09-30 DIAGNOSIS — Z7982 Long term (current) use of aspirin: Secondary | ICD-10-CM | POA: Insufficient documentation

## 2013-09-30 DIAGNOSIS — R0602 Shortness of breath: Secondary | ICD-10-CM | POA: Insufficient documentation

## 2013-09-30 DIAGNOSIS — R1011 Right upper quadrant pain: Secondary | ICD-10-CM | POA: Insufficient documentation

## 2013-09-30 DIAGNOSIS — R195 Other fecal abnormalities: Secondary | ICD-10-CM | POA: Insufficient documentation

## 2013-09-30 DIAGNOSIS — R110 Nausea: Secondary | ICD-10-CM | POA: Insufficient documentation

## 2013-09-30 DIAGNOSIS — I10 Essential (primary) hypertension: Secondary | ICD-10-CM | POA: Insufficient documentation

## 2013-09-30 DIAGNOSIS — Z91040 Latex allergy status: Secondary | ICD-10-CM | POA: Insufficient documentation

## 2013-09-30 DIAGNOSIS — R109 Unspecified abdominal pain: Secondary | ICD-10-CM

## 2013-09-30 DIAGNOSIS — Z880 Allergy status to penicillin: Secondary | ICD-10-CM | POA: Insufficient documentation

## 2013-09-30 DIAGNOSIS — Z79899 Other long term (current) drug therapy: Secondary | ICD-10-CM | POA: Insufficient documentation

## 2013-09-30 LAB — CBC WITH DIFFERENTIAL/PLATELET
BASOS ABS: 0 10*3/uL (ref 0.0–0.1)
Basophils Relative: 0 % (ref 0–1)
Eosinophils Absolute: 0 10*3/uL (ref 0.0–0.7)
Eosinophils Relative: 1 % (ref 0–5)
HCT: 39.6 % (ref 36.0–46.0)
HEMOGLOBIN: 13.4 g/dL (ref 12.0–15.0)
Lymphocytes Relative: 44 % (ref 12–46)
Lymphs Abs: 2.3 10*3/uL (ref 0.7–4.0)
MCH: 30.6 pg (ref 26.0–34.0)
MCHC: 33.8 g/dL (ref 30.0–36.0)
MCV: 90.4 fL (ref 78.0–100.0)
Monocytes Absolute: 0.8 10*3/uL (ref 0.1–1.0)
Monocytes Relative: 15 % — ABNORMAL HIGH (ref 3–12)
NEUTROS ABS: 2 10*3/uL (ref 1.7–7.7)
Neutrophils Relative %: 40 % — ABNORMAL LOW (ref 43–77)
PLATELETS: 162 10*3/uL (ref 150–400)
RBC: 4.38 MIL/uL (ref 3.87–5.11)
RDW: 13 % (ref 11.5–15.5)
WBC: 5.1 10*3/uL (ref 4.0–10.5)

## 2013-09-30 LAB — BASIC METABOLIC PANEL
BUN: 17 mg/dL (ref 6–23)
CALCIUM: 9 mg/dL (ref 8.4–10.5)
CO2: 24 mEq/L (ref 19–32)
CREATININE: 0.63 mg/dL (ref 0.50–1.10)
Chloride: 106 mEq/L (ref 96–112)
GLUCOSE: 91 mg/dL (ref 70–99)
POTASSIUM: 3.7 meq/L (ref 3.7–5.3)
Sodium: 142 mEq/L (ref 137–147)

## 2013-09-30 LAB — HEPATIC FUNCTION PANEL
ALBUMIN: 3.9 g/dL (ref 3.5–5.2)
ALK PHOS: 114 U/L (ref 39–117)
ALT: 18 U/L (ref 0–35)
AST: 18 U/L (ref 0–37)
Bilirubin, Direct: <0.2 mg/dL (ref 0.0–0.3)
TOTAL PROTEIN: 7.3 g/dL (ref 6.0–8.3)
Total Bilirubin: 0.2 mg/dL — ABNORMAL LOW (ref 0.3–1.2)

## 2013-09-30 LAB — LIPASE, BLOOD: LIPASE: 41 U/L (ref 11–59)

## 2013-09-30 MED ORDER — HYDROMORPHONE HCL PF 1 MG/ML IJ SOLN
0.5000 mg | Freq: Once | INTRAMUSCULAR | Status: AC
  Administered 2013-09-30: 0.5 mg via INTRAVENOUS
  Filled 2013-09-30: qty 1

## 2013-09-30 MED ORDER — HYDROCODONE-ACETAMINOPHEN 5-325 MG PO TABS: 1.0000 | ORAL_TABLET | Freq: Four times a day (QID) | ORAL | Status: DC | PRN

## 2013-09-30 MED ORDER — ONDANSETRON HCL 4 MG/2ML IJ SOLN
4.0000 mg | Freq: Once | INTRAMUSCULAR | Status: AC
  Administered 2013-09-30: 4 mg via INTRAVENOUS
  Filled 2013-09-30: qty 2

## 2013-09-30 MED ORDER — ONDANSETRON HCL 4 MG PO TABS: 4.0000 mg | ORAL_TABLET | Freq: Three times a day (TID) | ORAL | Status: DC | PRN

## 2013-09-30 NOTE — ED Notes (Signed)
Patient transported to Ultrasound 

## 2013-09-30 NOTE — Discharge Instructions (Signed)
Your pain is likely from your gallbladder, there is no evidence of infection currently. Please see your doctor in two days. If you have worsening symptoms return to the ED. Do not drive or work while taking Norco  Abdominal Pain, Adult Many things can cause abdominal pain. Usually, abdominal pain is not caused by a disease and will improve without treatment. It can often be observed and treated at home. Your health care provider will do a physical exam and possibly order blood tests and X-rays to help determine the seriousness of your pain. However, in many cases, more time must pass before a clear cause of the pain can be found. Before that point, your health care provider may not know if you need more testing or further treatment. HOME CARE INSTRUCTIONS  Monitor your abdominal pain for any changes. The following actions may help to alleviate any discomfort you are experiencing:  Only take over-the-counter or prescription medicines as directed by your health care provider.  Do not take laxatives unless directed to do so by your health care provider.  Try a clear liquid diet (broth, tea, or water) as directed by your health care provider. Slowly move to a bland diet as tolerated. SEEK MEDICAL CARE IF:  You have unexplained abdominal pain.  You have abdominal pain associated with nausea or diarrhea.  You have pain when you urinate or have a bowel movement.  You experience abdominal pain that wakes you in the night.  You have abdominal pain that is worsened or improved by eating food.  You have abdominal pain that is worsened with eating fatty foods. SEEK IMMEDIATE MEDICAL CARE IF:   Your pain does not go away within 2 hours.  You have a fever.  You keep throwing up (vomiting).  Your pain is felt only in portions of the abdomen, such as the right side or the left lower portion of the abdomen.  You pass bloody or black tarry stools. MAKE SURE YOU:  Understand these instructions.    Will watch your condition.   Will get help right away if you are not doing well or get worse.  Document Released: 05/23/2005 Document Revised: 06/03/2013 Document Reviewed: 04/22/2013 [facility] Patient Information 2014 [place].

## 2013-09-30 NOTE — ED Provider Notes (Signed)
CSN: [id number]     Arrival date & time 09/30/13  1354 History   First MD Initiated Contact with Patient 09/30/13 1617     Chief Complaint  Patient presents with  . Abdominal Pain    HPI: Cheryl Barnes is a 63 yo F with history of HTN who presents with RUQ pain for the last week. Her pain was initially intermittent, occuring after meals, associated with bloating. Over the last 3 days her pain has become more constant. Her pain is described as cramping and burning, radiates to her back and right shoulder. Associated with nausea but no vomiting or diarrhea. Her stools have been lighter. Pain currently 5/10. No SOB, lower abdominal pain, vaginal bleeding, discharge, urinary symptoms, cough or respiratory symptoms. She has not had fever. She had similar pain 7-8 months ago, was worked up with [place] and HIDA scan which was normal. Her pain resolved and did not return until one week ago.    Past Medical History  Diagnosis Date  . Hypertension   . Migraine     history  . Allergy    Past Surgical History  Procedure Laterality Date  . Cesarean section    . Fracture surgery     Family History  Problem Relation Age of Onset  . Depression Other   . Diabetes Other   . Hypertension Other   . Cancer Other     breast   History  Substance Use Topics  . Smoking status: Never Smoker   . Smokeless tobacco: Not on file  . Alcohol Use: No   OB History   Grav Para Term Preterm Abortions TAB SAB Ect Mult Living                 Review of Systems  Constitutional: Negative for fever, chills, appetite change and fatigue.  Eyes: Negative for photophobia and visual disturbance.  Respiratory: Positive for shortness of breath. Negative for cough.   Cardiovascular: Negative for chest pain and leg swelling.  Gastrointestinal: Positive for nausea. Negative for vomiting, abdominal pain, diarrhea and constipation.       + Light colored stools   Genitourinary: Negative for dysuria, frequency and decreased urine  volume.  Musculoskeletal: Negative for arthralgias, back pain, gait problem and myalgias.  Skin: Negative for color change and wound.  Neurological: Negative for dizziness, syncope, light-headedness and headaches.  Psychiatric/Behavioral: Negative for confusion and agitation.  All other systems reviewed and are negative.    Allergies  Latex and Penicillins  Home Medications   Current Outpatient Rx  Name  Route  Sig  Dispense  Refill  . acetaminophen (TYLENOL) 500 MG tablet   Oral   Take 1,000 mg by mouth every 6 (six) hours as needed for moderate pain.         Marland Kitchen amLODipine (NORVASC) 5 MG tablet   Oral   Take 1 tablet (5 mg total) by mouth daily.   100 tablet   3   . aspirin 81 MG tablet   Oral   Take 81 mg by mouth daily.         . calcium-vitamin D 250-100 MG-UNIT per tablet   Oral   Take 1 tablet by mouth daily.         Marland Kitchen estradiol (ESTRACE VAGINAL) 0.1 MG/GM vaginal cream   Vaginal   Place [id number] Applicatorfuls vaginally daily.   42.5 g   12   . fish oil-omega-3 fatty acids 1000 MG capsule   Oral   Take  1 g by mouth daily.         . potassium chloride SA (K-DUR,KLOR-CON) 20 MEQ tablet   Oral   Take 20 mEq by mouth daily.         . ramipril (ALTACE) 10 MG capsule   Oral   Take 1 capsule (10 mg total) by mouth daily.   100 capsule   3   . valACYclovir (VALTREX) 1000 MG tablet   Oral   Take 1,000 mg by mouth as needed (for flareup).          BP 108/69  Pulse 72  Temp(Src) 97.9 F (36.6 C) (Oral)  Resp 18  SpO2 99% Physical Exam  Nursing note and vitals reviewed. Constitutional: She is oriented to person, place, and time. She appears well-developed and well-nourished. No distress.  HENT:  Head: Normocephalic and atraumatic.  Mouth/Throat: Oropharynx is clear and moist.  Eyes: Conjunctivae and EOM are normal. Pupils are equal, round, and reactive to light.  Neck: Normal range of motion. Neck supple.  Cardiovascular: Normal rate, regular  rhythm, normal heart sounds and intact distal pulses.   Pulmonary/Chest: Effort normal and breath sounds normal. No respiratory distress.  Abdominal: Soft. Bowel sounds are normal. There is tenderness (RUQ, negative Murphy's). There is no rebound and no guarding.  Musculoskeletal: Normal range of motion. She exhibits no edema and no tenderness.  Neurological: She is alert and oriented to person, place, and time. No cranial nerve deficit. Coordination normal.  Skin: Skin is warm and dry. No rash noted.  Psychiatric: She has a normal mood and affect. Her behavior is normal.    ED Course  Procedures (including critical care time) Labs Review Labs Reviewed  CBC WITH DIFFERENTIAL - Abnormal; Notable for the following:    Neutrophils Relative % 40 (*)    Monocytes Relative 15 (*)    All other components within normal limits  HEPATIC FUNCTION PANEL - Abnormal; Notable for the following:    Total Bilirubin 0.2 (*)    All other components within normal limits  BASIC METABOLIC PANEL  LIPASE, BLOOD   Imaging Review US Abdomen Limited  09/30/2013   CLINICAL DATA:  Right upper abdominal pain  EXAM: US ABDOMEN LIMITED - RIGHT UPPER QUADRANT  COMPARISON:  CT 03/14/2007  FINDINGS: Gallbladder:  There is no evidence for gallstones, gallbladder wall thickening or pericholecystic fluid. The sonographer reports no sonographic Murphy's sign.  Common bile duct:  Diameter: 3.1 mm diameter, unremarkable  Liver:  Homogeneous hepatic echotexture. No discrete hepatic lesions. No definite evidence of intrahepatic biliary ductal dilatation. No ascites.  IMPRESSION: Negative.  Normal gallbladder.   Electronically Signed   By: Arne Cleveland M.D.   On: 09/30/2013 17:35    EKG Interpretation   None       MDM  63 yo F with history of HTN who presents with RUQ pain and nausea. Similar symptoms several months ago, negative biliary w/u at that time. Pain is typical of biliary colic. Low suspicion for ACS given  location of pain, no SOB, pain with meals, pain reproducible on exam. Doubt PE as she is not SOB, not tachycardic or hypoxic. No evidence of biliary obstruction on labs. Lipase normal. CBC normal. Obtained RUQ [place] which was negative for stones or cholecystitis. Unsure what is causing her pain, doubt an emergent or urgent cause. Will provide small amount of pain medication. Advised PCP f/u in two days. Return sooner for new or worsening symptoms. The patient was in agreement  with plan,.   Reviewed imaging, labs and previous medical records, utilized in MDM  Discussed case with Dr. Tomi Bamberger  Clinical Impression 1. Abdominal pain      Louretta Shorten, MD 09/30/13 1840

## 2013-09-30 NOTE — ED Provider Notes (Signed)
I saw and evaluated the patient, reviewed the resident's note and I agree with the findings and plan.  Pt with recurrent upper abdominal pain.   LAbs and US unremarkable.    Pt comfortable with outpatient follow up.  Kathalene Frames, MD 09/30/13 [phone]

## 2013-09-30 NOTE — ED Notes (Signed)
Pt reports RUQ pain radiating to back. States pain worse after eating. States had pain 7-8 months ago, pain resolved, had testing. The last 4 days pain returned, not going away.

## 2013-10-06 ENCOUNTER — Encounter: Payer: Self-pay | Admitting: Family Medicine

## 2013-10-06 ENCOUNTER — Ambulatory Visit (INDEPENDENT_AMBULATORY_CARE_PROVIDER_SITE_OTHER): Payer: BC Managed Care – PPO | Admitting: Family Medicine

## 2013-10-06 VITALS — BP 130/80 | Temp 98.5°F | Wt 162.0 lb

## 2013-10-06 DIAGNOSIS — J189 Pneumonia, unspecified organism: Secondary | ICD-10-CM

## 2013-10-06 DIAGNOSIS — J181 Lobar pneumonia, unspecified organism: Principal | ICD-10-CM

## 2013-10-06 MED ORDER — CLARITHROMYCIN 500 MG PO TABS: 500.0000 mg | ORAL_TABLET | Freq: Two times a day (BID) | ORAL | Status: DC

## 2013-10-06 MED ORDER — HYDROCODONE-HOMATROPINE 5-1.5 MG/5ML PO SYRP: 5.0000 mL | ORAL_SOLUTION | Freq: Three times a day (TID) | ORAL | Status: DC | PRN

## 2013-10-06 NOTE — Progress Notes (Signed)
   Subjective:    Patient ID: Cheryl Barnes, female    DOB: [birth year], 63 y.o.   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 She's had this problem in the past and had a negative workup for GI disease and gallbladder disease. She said the pain can be provoked from Jell-O or fatty foods it doesn't seem to make a lot of difference. In the emergency room her studies were normal. She's seen GI in the past and I recommend she see them again for reconsultation  Since she was in the emergency room 6 days ago she's had a fever headache and cough. Fever last Friday return when away. The cough has persisted. No vomiting diarrhea etc.   Review of Systems Review of systems negative    Objective:   Physical Exam  Well-developed and nourished female no acute distress vital signs stable she is afebrile ENT negative neck was supple no adenopathy lungs shows crackles left base      Assessment & Plan:  Left lower lobe pneumonia probable mycoplasma versus viral treat with erythromycin

## 2013-10-06 NOTE — Patient Instructions (Signed)
Drink lots of water  Biaxin 500 mg............ one twice daily  Hydromet.......Marland Kitchen 1/2-1 teaspoon 3 times daily. For cough  Return when necessary

## 2013-10-06 NOTE — Progress Notes (Signed)
Pre visit review using our clinic review tool, if applicable. No additional management support is needed unless otherwise documented below in the visit note. 

## 2013-10-13 ENCOUNTER — Other Ambulatory Visit: Payer: Self-pay | Admitting: Family Medicine

## 2013-11-18 ENCOUNTER — Encounter: Payer: Self-pay | Admitting: Internal Medicine

## 2014-02-16 ENCOUNTER — Other Ambulatory Visit: Payer: Self-pay | Admitting: Family Medicine

## 2014-06-04 ENCOUNTER — Telehealth: Payer: Self-pay | Admitting: Family Medicine

## 2014-06-04 DIAGNOSIS — J984 Other disorders of lung: Secondary | ICD-10-CM

## 2014-06-04 NOTE — Telephone Encounter (Signed)
Pt had a ct scan and they found a spot on ler lung. Dr Rainey Pines will be sending dr todd a report. They want to know who pt will be referred to.

## 2014-06-07 NOTE — Telephone Encounter (Signed)
Per Dr Sherren Mocha refer to pulmonary.  Left message for pt to call back

## 2014-06-08 NOTE — Telephone Encounter (Signed)
Referral placed.

## 2014-06-09 ENCOUNTER — Telehealth: Payer: Self-pay | Admitting: Pulmonary Disease

## 2014-06-09 ENCOUNTER — Ambulatory Visit (INDEPENDENT_AMBULATORY_CARE_PROVIDER_SITE_OTHER): Payer: BC Managed Care – PPO | Admitting: Pulmonary Disease

## 2014-06-09 ENCOUNTER — Ambulatory Visit (INDEPENDENT_AMBULATORY_CARE_PROVIDER_SITE_OTHER)
Admission: RE | Admit: 2014-06-09 | Discharge: 2014-06-09 | Disposition: A | Discharge status: Home / Self Care | Payer: BC Managed Care – PPO | Source: Ambulatory Visit | Attending: Pulmonary Disease | Admitting: Pulmonary Disease

## 2014-06-09 ENCOUNTER — Encounter: Payer: Self-pay | Admitting: Pulmonary Disease

## 2014-06-09 VITALS — BP 122/82 | HR 79 | Temp 99.1°F | Ht 61.0 in | Wt 164.6 lb

## 2014-06-09 DIAGNOSIS — R911 Solitary pulmonary nodule: Secondary | ICD-10-CM | POA: Insufficient documentation

## 2014-06-09 NOTE — Patient Instructions (Signed)
Will check a chest xray to make sure no other abnormalities in the rest of the chest. You are very low risk for cancer, and would recommend a followup chest scan in one year.  Please call in a year so we can set up.

## 2014-06-09 NOTE — Assessment & Plan Note (Signed)
The patient has been found to have an incidental 5 mm pulmonary nodule in the right lower lobe. She has no history of cancer, has never lived in areas known for fungal diseases, and has no history of tuberculosis.  She is a total nonsmoker, and tells me that her health maintenance is up-to-date. She is in a very low risk group, and by Fleischner criteria will need a followup scan in one year. If this is unchanged, no further followup is required. She has not had a complete chest CT, but because she is at very low-risk, will do a plain chest radiograph to make sure there are no other significant abnormalities in the upper lung fields. The patient is agreeable to this approach.

## 2014-06-09 NOTE — Progress Notes (Signed)
   Subjective:    Patient ID: Cheryl Barnes, female    DOB: 1950/12/24, 63 y.o.   MRN: [id number]  HPI The patient is a 63 year old female who I've been asked to see for management of a solitary pulmonary nodule. It was incidentally found on a CT abdomen and pelvis, and is 5 mm in the right lower lobe. No other abnormalities were found in the lower lung fields. The patient has never smoked, and has no personal history of cancer. She has never lived in the [place] or [facility], and has always had a negative PPD. She denies having direct exposure to a person with tuberculosis. However, she does work in [employer]. She denies any shortness of breath, cough, or mucus production. She tells me that her health maintenance is completely up-to-date.   Review of Systems  Constitutional: Negative for fever and unexpected weight change.  HENT: Negative for congestion, dental problem, ear pain, nosebleeds, postnasal drip, rhinorrhea, sinus pressure, sneezing, sore throat and trouble swallowing.        Allergies- treated with Zyrtec  Eyes: Negative for redness and itching.  Respiratory: Positive for wheezing ( ocassional). Negative for cough, chest tightness and shortness of breath.   Cardiovascular: Positive for chest pain ( ocassional with exertion). Negative for palpitations and leg swelling.  Gastrointestinal: Positive for nausea and vomiting ( rare-- RUQ pain).  Genitourinary: Negative for dysuria.  Musculoskeletal: Negative for joint swelling.  Skin: Negative for rash.  Neurological: Negative for headaches.  Hematological: Does not bruise/bleed easily.  Psychiatric/Behavioral: Negative for dysphoric mood. The patient is not nervous/anxious.        Objective:   Physical Exam Constitutional:  Well developed, no acute distress  HENT:  Nares patent without discharge  Oropharynx without exudate, palate and uvula are normal  Eyes:  Perrla, eomi, no scleral icterus  Neck:  No  JVD, no TMG  Cardiovascular:  Normal rate, regular rhythm, no rubs or gallops.  No murmurs        Intact distal pulses  Pulmonary :  Normal breath sounds, no stridor or respiratory distress   No rales, rhonchi, or wheezing  Abdominal:  Soft, nondistended, bowel sounds present.  No tenderness noted.   Musculoskeletal:  No lower extremity edema noted.  Lymph Nodes:  No cervical lymphadenopathy noted  Skin:  No cyanosis noted  Neurologic:  Alert, appropriate, moves all 4 extremities without obvious deficit.         Assessment & Plan:

## 2014-06-10 NOTE — Telephone Encounter (Signed)
I spoke with patient about results and she verbalized understanding and had no questions 

## 2014-06-10 NOTE — Telephone Encounter (Signed)
lmtcb x2 

## 2014-06-10 NOTE — Telephone Encounter (Signed)
Result Note     Let pt know that her cxr is clear. No change from plan discussed   Called pt and LMTCB x1

## 2014-06-19 ENCOUNTER — Emergency Department (HOSPITAL_COMMUNITY): Payer: BC Managed Care – PPO

## 2014-06-19 ENCOUNTER — Emergency Department (HOSPITAL_COMMUNITY)
Admission: EM | Admit: 2014-06-19 | Discharge: 2014-06-19 | Disposition: A | Discharge status: Home / Self Care | Payer: BC Managed Care – PPO | Attending: Emergency Medicine | Admitting: Emergency Medicine

## 2014-06-19 ENCOUNTER — Encounter (HOSPITAL_COMMUNITY): Payer: Self-pay | Admitting: Emergency Medicine

## 2014-06-19 DIAGNOSIS — G43909 Migraine, unspecified, not intractable, without status migrainosus: Secondary | ICD-10-CM | POA: Insufficient documentation

## 2014-06-19 DIAGNOSIS — Z79899 Other long term (current) drug therapy: Secondary | ICD-10-CM | POA: Insufficient documentation

## 2014-06-19 DIAGNOSIS — R1011 Right upper quadrant pain: Secondary | ICD-10-CM | POA: Insufficient documentation

## 2014-06-19 DIAGNOSIS — R197 Diarrhea, unspecified: Secondary | ICD-10-CM | POA: Insufficient documentation

## 2014-06-19 DIAGNOSIS — Z9889 Other specified postprocedural states: Secondary | ICD-10-CM | POA: Insufficient documentation

## 2014-06-19 DIAGNOSIS — Z90710 Acquired absence of both cervix and uterus: Secondary | ICD-10-CM | POA: Diagnosis not present

## 2014-06-19 DIAGNOSIS — R000 Tachycardia, unspecified: Secondary | ICD-10-CM | POA: Insufficient documentation

## 2014-06-19 DIAGNOSIS — Z880 Allergy status to penicillin: Secondary | ICD-10-CM | POA: Insufficient documentation

## 2014-06-19 DIAGNOSIS — R509 Fever, unspecified: Secondary | ICD-10-CM | POA: Insufficient documentation

## 2014-06-19 DIAGNOSIS — I10 Essential (primary) hypertension: Secondary | ICD-10-CM | POA: Diagnosis not present

## 2014-06-19 DIAGNOSIS — Z9049 Acquired absence of other specified parts of digestive tract: Secondary | ICD-10-CM | POA: Diagnosis not present

## 2014-06-19 DIAGNOSIS — K5792 Diverticulitis of intestine, part unspecified, without perforation or abscess without bleeding: Secondary | ICD-10-CM

## 2014-06-19 DIAGNOSIS — Z8719 Personal history of other diseases of the digestive system: Secondary | ICD-10-CM | POA: Insufficient documentation

## 2014-06-19 DIAGNOSIS — Z91040 Latex allergy status: Secondary | ICD-10-CM | POA: Diagnosis not present

## 2014-06-19 DIAGNOSIS — Z7982 Long term (current) use of aspirin: Secondary | ICD-10-CM | POA: Diagnosis not present

## 2014-06-19 DIAGNOSIS — R1032 Left lower quadrant pain: Secondary | ICD-10-CM | POA: Insufficient documentation

## 2014-06-19 DIAGNOSIS — R1031 Right lower quadrant pain: Secondary | ICD-10-CM | POA: Diagnosis not present

## 2014-06-19 DIAGNOSIS — R112 Nausea with vomiting, unspecified: Secondary | ICD-10-CM | POA: Insufficient documentation

## 2014-06-19 LAB — COMPREHENSIVE METABOLIC PANEL
ALBUMIN: 4.1 g/dL (ref 3.5–5.2)
ALK PHOS: 115 U/L (ref 39–117)
ALT: 21 U/L (ref 0–35)
ANION GAP: 12 (ref 5–15)
AST: 17 U/L (ref 0–37)
BUN: 13 mg/dL (ref 6–23)
CALCIUM: 9.3 mg/dL (ref 8.4–10.5)
CO2: 23 mEq/L (ref 19–32)
CREATININE: 0.74 mg/dL (ref 0.50–1.10)
Chloride: 102 mEq/L (ref 96–112)
GFR calc Af Amer: >90 mL/min (ref >90)
GFR calc non Af Amer: 89 mL/min — ABNORMAL LOW (ref >90)
Glucose, Bld: 122 mg/dL — ABNORMAL HIGH (ref 70–99)
POTASSIUM: 4.1 meq/L (ref 3.7–5.3)
Sodium: 137 mEq/L (ref 137–147)
TOTAL PROTEIN: 7.6 g/dL (ref 6.0–8.3)
Total Bilirubin: 0.5 mg/dL (ref 0.3–1.2)

## 2014-06-19 LAB — CBC WITH DIFFERENTIAL/PLATELET
BASOS PCT: 0 % (ref 0–1)
Basophils Absolute: 0 10*3/uL (ref 0.0–0.1)
EOS ABS: 0 10*3/uL (ref 0.0–0.7)
Eosinophils Relative: 0 % (ref 0–5)
HCT: 42.4 % (ref 36.0–46.0)
HEMOGLOBIN: 14.7 g/dL (ref 12.0–15.0)
Lymphocytes Relative: 13 % (ref 12–46)
Lymphs Abs: 1.2 10*3/uL (ref 0.7–4.0)
MCH: 31.1 pg (ref 26.0–34.0)
MCHC: 34.7 g/dL (ref 30.0–36.0)
MCV: 89.6 fL (ref 78.0–100.0)
MONOS PCT: 9 % (ref 3–12)
Monocytes Absolute: 0.8 10*3/uL (ref 0.1–1.0)
NEUTROS PCT: 78 % — AB (ref 43–77)
Neutro Abs: 7.3 10*3/uL (ref 1.7–7.7)
PLATELETS: 168 10*3/uL (ref 150–400)
RBC: 4.73 MIL/uL (ref 3.87–5.11)
RDW: 12.8 % (ref 11.5–15.5)
WBC: 9.4 10*3/uL (ref 4.0–10.5)

## 2014-06-19 LAB — LIPASE, BLOOD: LIPASE: 34 U/L (ref 11–59)

## 2014-06-19 MED ORDER — SODIUM CHLORIDE 0.9 % IV BOLUS (SEPSIS)
500.0000 mL | Freq: Once | INTRAVENOUS | Status: AC
  Administered 2014-06-19: 500 mL via INTRAVENOUS

## 2014-06-19 MED ORDER — IOHEXOL 300 MG/ML  SOLN
100.0000 mL | Freq: Once | INTRAMUSCULAR | Status: AC | PRN
  Administered 2014-06-19: 100 mL via INTRAVENOUS

## 2014-06-19 MED ORDER — MORPHINE SULFATE 4 MG/ML IJ SOLN
4.0000 mg | Freq: Once | INTRAMUSCULAR | Status: AC
  Administered 2014-06-19: 4 mg via INTRAVENOUS
  Filled 2014-06-19: qty 1

## 2014-06-19 MED ORDER — ONDANSETRON HCL 4 MG/2ML IJ SOLN
4.0000 mg | Freq: Once | INTRAMUSCULAR | Status: AC
  Administered 2014-06-19: 4 mg via INTRAVENOUS
  Filled 2014-06-19: qty 2

## 2014-06-19 MED ORDER — ONDANSETRON 8 MG PO TBDP: 8.0000 mg | ORAL_TABLET | Freq: Three times a day (TID) | ORAL | Status: DC | PRN

## 2014-06-19 MED ORDER — IOHEXOL 300 MG/ML  SOLN
25.0000 mL | INTRAMUSCULAR | Status: DC | PRN
  Administered 2014-06-19: 25 mL via ORAL

## 2014-06-19 MED ORDER — HYDROMORPHONE HCL 1 MG/ML IJ SOLN
0.5000 mg | Freq: Once | INTRAMUSCULAR | Status: AC
  Administered 2014-06-19: 0.5 mg via INTRAMUSCULAR
  Filled 2014-06-19: qty 1

## 2014-06-19 MED ORDER — OXYCODONE-ACETAMINOPHEN 5-325 MG PO TABS: 1.0000 | ORAL_TABLET | Freq: Four times a day (QID) | ORAL | Status: DC | PRN

## 2014-06-19 NOTE — Discharge Instructions (Signed)

## 2014-06-19 NOTE — ED Notes (Signed)
Patient returned from CT

## 2014-06-19 NOTE — ED Provider Notes (Signed)
CSN: [id number]     Arrival date & time 06/19/14  0940 History   First MD Initiated Contact with Patient 06/19/14 [phone]     Chief Complaint  Patient presents with  . Abdominal Pain     (Consider location/radiation/quality/duration/timing/severity/associated sxs/prior Treatment) Patient is a 63 y.o. female presenting with abdominal pain. The history is provided by the patient.  Abdominal Pain Associated symptoms: diarrhea, fever, nausea and vomiting   Associated symptoms: no chest pain and no shortness of breath   patient presents with worsening left-sided lower abdominal pain. She had a CT scan 18 days ago showing diverticulitis. She has been on Cipro and Flagyl. She states she finished up around 4 days ago. She states she was doing well for 2 days and began to have nausea vomiting diarrhea and fevers again. She states the pain has returned and is more severe. She states she has an appetite but when she eats she throws up again. no dysuria.she states initially she had right upper quadrant pain that then became left lower quadrant. She says she now feels as if she is having pain on the right side also.  Past Medical History  Diagnosis Date  . Hypertension   . Migraine     history  . Allergy   . Diverticulitis    Past Surgical History  Procedure Laterality Date  . Cesarean section    . Fracture surgery    . Partial hysterectomy      Abdominal  . Cholecystectomy    . Rotator cuff repair Left    Family History  Problem Relation Age of Onset  . Hypertension Father   . Diabetes Father     adult onset  . Hypertension Mother   . Breast cancer Mother     breast  . Colon cancer Father   . Asthma Maternal Grandfather    History  Substance Use Topics  . Smoking status: Never Smoker   . Smokeless tobacco: Not on file  . Alcohol Use: No   OB History   Grav Para Term Preterm Abortions TAB SAB Ect Mult Living                 Review of Systems  Constitutional: Positive for  fever. Negative for activity change and appetite change.  Eyes: Negative for pain.  Respiratory: Negative for chest tightness and shortness of breath.   Cardiovascular: Negative for chest pain and leg swelling.  Gastrointestinal: Positive for nausea, vomiting, abdominal pain and diarrhea.  Genitourinary: Negative for flank pain.  Musculoskeletal: Negative for back pain and neck stiffness.  Skin: Negative for rash.  Neurological: Negative for weakness, numbness and headaches.  Psychiatric/Behavioral: Negative for behavioral problems.      Allergies  Latex and Penicillins  Home Medications   Prior to Admission medications   Medication Sig Start Date End Date Taking? Authorizing Provider  amLODipine (NORVASC) 5 MG tablet TAKE 1 TABLET DAILY 02/16/14  Yes Dorena Cookey, MD  aspirin 81 MG tablet Take 81 mg by mouth daily.   Yes Historical Provider, MD  ibuprofen (ADVIL,MOTRIN) 200 MG tablet Take 400 mg by mouth every 6 (six) hours as needed (for pain).   Yes Historical Provider, MD  KLOR-CON M20 20 MEQ tablet TAKE 1 TABLET EVERY MORNING 02/16/14  Yes Dorena Cookey, MD  MELATONIN PO Take 1 tablet by mouth at bedtime as needed (for pain).   Yes Historical Provider, MD  ramipril (ALTACE) 10 MG capsule TAKE 1 CAPSULE DAILY 02/16/14  Yes Dorena Cookey, MD  ondansetron (ZOFRAN-ODT) 8 MG disintegrating tablet Take 1 tablet (8 mg total) by mouth every 8 (eight) hours as needed for nausea or vomiting. 06/19/14   Jasper Riling. Annalina Needles, MD  oxyCODONE-acetaminophen (PERCOCET/ROXICET) 5-325 MG per tablet Take 1-2 tablets by mouth every 6 (six) hours as needed for severe pain. 06/19/14   Jasper Riling. Nizhoni Parlow, MD   BP 114/69  Pulse 95  Temp(Src) 97.8 F (36.6 C) (Oral)  Resp 20  SpO2 100% Physical Exam  Nursing note and vitals reviewed. Constitutional: She is oriented to person, place, and time. She appears well-developed.  Patient appears uncomfortable  HENT:  Head: Normocephalic and atraumatic.   Eyes: EOM are normal. Pupils are equal, round, and reactive to light.  Neck: Normal range of motion. Neck supple.  Cardiovascular: Regular rhythm and normal heart sounds.   No murmur heard. Mild tachycardia.  Pulmonary/Chest: Effort normal and breath sounds normal. No respiratory distress. She has no wheezes. She has no rales.  Abdominal: Soft. She exhibits no distension. There is tenderness. There is no rebound and no guarding.  On moderate tenderness left lower quadrant with some rebound. mild tenderness in right lower quadrant, but states the pain is more in the left lower abdomen. some less severe tenderness in right upper quadrant.  Musculoskeletal: Normal range of motion.  Neurological: She is alert and oriented to person, place, and time. No cranial nerve deficit.  Skin: Skin is warm and dry.  Psychiatric: She has a normal mood and affect. Her speech is normal.    ED Course  Procedures (including critical care time) Labs Review Labs Reviewed  CBC WITH DIFFERENTIAL - Abnormal; Notable for the following:    Neutrophils Relative % 78 (*)    All other components within normal limits  COMPREHENSIVE METABOLIC PANEL - Abnormal; Notable for the following:    Glucose, Bld 122 (*)    GFR calc non Af Amer 89 (*)    All other components within normal limits  LIPASE, BLOOD    Imaging Review Ct Abdomen Pelvis W Contrast  06/19/2014   CLINICAL DATA:  Recent diagnosis of diverticulitis, treated with antibiotics. Patient has become acutely worse with more severe left lower quadrant pain and left lower pelvic pain. Acutely nauseated. Multiple episodes of vomiting.  EXAM: CT ABDOMEN AND PELVIS WITH CONTRAST  TECHNIQUE: Multidetector CT imaging of the abdomen and pelvis was performed using the standard protocol following bolus administration of intravenous contrast.  CONTRAST:  166mL OMNIPAQUE IOHEXOL 300 MG/ML  SOLN  COMPARISON:  03/14/2007.  FINDINGS: There are multiple sigmoid and lower  descending colon diverticula, but no CT evidence of diverticulitis. There are no colonic inflammatory changes.  Normal small bowel.  Normal appendix is visualized.  3.9 cm cyst arises from the medial left kidney. No other renal masses. No stones. No hydronephrosis. Ureters are normal course and in caliber. Normal bladder.  Uterus is surgically absent.  No pelvic masses  Minor subsegmental atelectasis at the lung bases. Heart is normal in size.  Mild fatty infiltration of the liver. Liver otherwise unremarkable. Spleen and pancreas are unremarkable. No bile duct dilation. Status post cholecystectomy. No adrenal masses.  No pathologically enlarged lymph nodes. No abnormal fluid collections. No mesenteric inflammation.  No osteoblastic or osteolytic lesions.  IMPRESSION: 1. No CT evidence of diverticulitis. There are multiple lower descending colon and sigmoid colon diverticula. No evidence of an abscess. No extraluminal or free air. 2. No acute findings. No findings to explain  patient's severe left lower quadrant pain. 3. Left renal cyst, increased in size from the prior study, but still with simple cyst imaging characteristics. 4. Mild hepatic steatosis.   Electronically Signed   By: Lajean Manes M.D.   On: 06/19/2014 12:36     EKG Interpretation None      MDM   Final diagnoses:  Left lower quadrant pain  Nausea vomiting and diarrhea    Patient with abdominal pain. Recent CT proven diverticultis. Recently finished abx. CT redone without diverticulitis or abscess. Has tolerated orals. Will give antiemetics and pain control. C-diff considered, but felt less likely. Will d/c to follow with PCP    Jasper Riling. Alvino Chapel, MD 06/19/14 1346

## 2014-06-19 NOTE — ED Notes (Signed)
Patient transported to CT 

## 2014-06-19 NOTE — ED Notes (Signed)
Pt in c/o lower abd pain with n/v/d, states she just finished a course of antibiotics for diverticulitis, states the symptoms feel the same and are not improving

## 2014-06-19 NOTE — ED Notes (Signed)
Notified CT finished with PO contrast

## 2014-07-23 ENCOUNTER — Other Ambulatory Visit: Payer: Self-pay | Admitting: Family Medicine

## 2014-08-18 LAB — HM MAMMOGRAPHY: HM MAMMO: NORMAL

## 2014-08-19 ENCOUNTER — Encounter: Payer: Self-pay | Admitting: Family Medicine

## 2014-08-23 ENCOUNTER — Encounter: Payer: Self-pay | Admitting: Family Medicine

## 2014-08-23 LAB — HM DEXA SCAN

## 2014-10-21 ENCOUNTER — Other Ambulatory Visit: Payer: Self-pay | Admitting: Family Medicine

## 2014-10-29 ENCOUNTER — Other Ambulatory Visit: Payer: Self-pay | Admitting: Family Medicine

## 2014-12-18 ENCOUNTER — Inpatient Hospital Stay (HOSPITAL_COMMUNITY)
Admission: EM | Admit: 2014-12-18 | Discharge: 2014-12-23 | DRG: 392 | Disposition: A | Discharge status: Home / Self Care | Payer: BLUE CROSS/BLUE SHIELD | Attending: Internal Medicine | Admitting: Internal Medicine

## 2014-12-18 ENCOUNTER — Encounter (HOSPITAL_COMMUNITY): Payer: Self-pay | Admitting: *Deleted

## 2014-12-18 ENCOUNTER — Emergency Department (HOSPITAL_COMMUNITY): Payer: BLUE CROSS/BLUE SHIELD

## 2014-12-18 DIAGNOSIS — R1032 Left lower quadrant pain: Secondary | ICD-10-CM | POA: Diagnosis not present

## 2014-12-18 DIAGNOSIS — Z8249 Family history of ischemic heart disease and other diseases of the circulatory system: Secondary | ICD-10-CM | POA: Diagnosis not present

## 2014-12-18 DIAGNOSIS — G43909 Migraine, unspecified, not intractable, without status migrainosus: Secondary | ICD-10-CM | POA: Diagnosis present

## 2014-12-18 DIAGNOSIS — E876 Hypokalemia: Secondary | ICD-10-CM | POA: Diagnosis not present

## 2014-12-18 DIAGNOSIS — R079 Chest pain, unspecified: Secondary | ICD-10-CM | POA: Diagnosis present

## 2014-12-18 DIAGNOSIS — K921 Melena: Secondary | ICD-10-CM | POA: Diagnosis present

## 2014-12-18 DIAGNOSIS — Z7982 Long term (current) use of aspirin: Secondary | ICD-10-CM | POA: Diagnosis not present

## 2014-12-18 DIAGNOSIS — R109 Unspecified abdominal pain: Secondary | ICD-10-CM | POA: Diagnosis present

## 2014-12-18 DIAGNOSIS — K5792 Diverticulitis of intestine, part unspecified, without perforation or abscess without bleeding: Secondary | ICD-10-CM | POA: Diagnosis present

## 2014-12-18 DIAGNOSIS — I209 Angina pectoris, unspecified: Secondary | ICD-10-CM | POA: Diagnosis not present

## 2014-12-18 DIAGNOSIS — Z90711 Acquired absence of uterus with remaining cervical stump: Secondary | ICD-10-CM | POA: Diagnosis present

## 2014-12-18 DIAGNOSIS — K5730 Diverticulosis of large intestine without perforation or abscess without bleeding: Secondary | ICD-10-CM | POA: Diagnosis present

## 2014-12-18 DIAGNOSIS — Z880 Allergy status to penicillin: Secondary | ICD-10-CM

## 2014-12-18 DIAGNOSIS — I447 Left bundle-branch block, unspecified: Secondary | ICD-10-CM | POA: Diagnosis present

## 2014-12-18 DIAGNOSIS — K5732 Diverticulitis of large intestine without perforation or abscess without bleeding: Secondary | ICD-10-CM | POA: Diagnosis present

## 2014-12-18 DIAGNOSIS — Z91040 Latex allergy status: Secondary | ICD-10-CM

## 2014-12-18 DIAGNOSIS — Z9049 Acquired absence of other specified parts of digestive tract: Secondary | ICD-10-CM | POA: Diagnosis present

## 2014-12-18 DIAGNOSIS — R0789 Other chest pain: Secondary | ICD-10-CM | POA: Diagnosis not present

## 2014-12-18 DIAGNOSIS — I10 Essential (primary) hypertension: Secondary | ICD-10-CM | POA: Diagnosis present

## 2014-12-18 LAB — COMPREHENSIVE METABOLIC PANEL
ALT: 18 U/L (ref 0–35)
AST: 20 U/L (ref 0–37)
Albumin: 4.6 g/dL (ref 3.5–5.2)
Alkaline Phosphatase: 92 U/L (ref 39–117)
Anion gap: 8 (ref 5–15)
BUN: 11 mg/dL (ref 6–23)
CALCIUM: 9.2 mg/dL (ref 8.4–10.5)
CO2: 24 mmol/L (ref 19–32)
CREATININE: 0.63 mg/dL (ref 0.50–1.10)
Chloride: 106 mmol/L (ref 96–112)
GFR calc non Af Amer: >90 mL/min (ref >90)
GLUCOSE: 122 mg/dL — AB (ref 70–99)
Potassium: 4 mmol/L (ref 3.5–5.1)
SODIUM: 138 mmol/L (ref 135–145)
Total Bilirubin: 0.8 mg/dL (ref 0.3–1.2)
Total Protein: 8 g/dL (ref 6.0–8.3)

## 2014-12-18 LAB — CBC WITH DIFFERENTIAL/PLATELET
BASOS PCT: 0 % (ref 0–1)
Basophils Absolute: 0 10*3/uL (ref 0.0–0.1)
EOS ABS: 0 10*3/uL (ref 0.0–0.7)
Eosinophils Relative: 0 % (ref 0–5)
HCT: 42.1 % (ref 36.0–46.0)
Hemoglobin: 14.1 g/dL (ref 12.0–15.0)
LYMPHS ABS: 1.7 10*3/uL (ref 0.7–4.0)
Lymphocytes Relative: 16 % (ref 12–46)
MCH: 30.9 pg (ref 26.0–34.0)
MCHC: 33.5 g/dL (ref 30.0–36.0)
MCV: 92.3 fL (ref 78.0–100.0)
Monocytes Absolute: 1.2 10*3/uL — ABNORMAL HIGH (ref 0.1–1.0)
Monocytes Relative: 11 % (ref 3–12)
NEUTROS PCT: 73 % (ref 43–77)
Neutro Abs: 7.7 10*3/uL (ref 1.7–7.7)
PLATELETS: 164 10*3/uL (ref 150–400)
RBC: 4.56 MIL/uL (ref 3.87–5.11)
RDW: 12.9 % (ref 11.5–15.5)
WBC: 10.6 10*3/uL — ABNORMAL HIGH (ref 4.0–10.5)

## 2014-12-18 LAB — URINALYSIS, ROUTINE W REFLEX MICROSCOPIC
Bilirubin Urine: NEGATIVE
GLUCOSE, UA: NEGATIVE mg/dL
Hgb urine dipstick: NEGATIVE
KETONES UR: 15 mg/dL — AB
Leukocytes, UA: NEGATIVE
Nitrite: NEGATIVE
PH: 7.5 (ref 5.0–8.0)
Protein, ur: NEGATIVE mg/dL
SPECIFIC GRAVITY, URINE: 1.015 (ref 1.005–1.030)
Urobilinogen, UA: 0.2 mg/dL (ref 0.0–1.0)

## 2014-12-18 LAB — LIPASE, BLOOD: Lipase: 25 U/L (ref 11–59)

## 2014-12-18 MED ORDER — SODIUM CHLORIDE 0.9 % IV BOLUS (SEPSIS)
1000.0000 mL | Freq: Once | INTRAVENOUS | Status: AC
  Administered 2014-12-18: 1000 mL via INTRAVENOUS

## 2014-12-18 MED ORDER — ONDANSETRON HCL 4 MG/2ML IJ SOLN
4.0000 mg | Freq: Once | INTRAMUSCULAR | Status: AC
  Administered 2014-12-18: 4 mg via INTRAVENOUS
  Filled 2014-12-18: qty 2

## 2014-12-18 MED ORDER — MORPHINE SULFATE 4 MG/ML IJ SOLN
4.0000 mg | Freq: Once | INTRAMUSCULAR | Status: AC
  Administered 2014-12-18: 4 mg via INTRAVENOUS
  Filled 2014-12-18: qty 1

## 2014-12-18 MED ORDER — PROMETHAZINE HCL 25 MG/ML IJ SOLN
25.0000 mg | Freq: Once | INTRAMUSCULAR | Status: AC
  Administered 2014-12-18: 25 mg via INTRAVENOUS
  Filled 2014-12-18: qty 1

## 2014-12-18 MED ORDER — METRONIDAZOLE IN NACL 5-0.79 MG/ML-% IV SOLN
500.0000 mg | Freq: Once | INTRAVENOUS | Status: AC
  Administered 2014-12-18: 500 mg via INTRAVENOUS
  Filled 2014-12-18: qty 100

## 2014-12-18 MED ORDER — HYDROMORPHONE HCL 1 MG/ML IJ SOLN
1.0000 mg | Freq: Once | INTRAMUSCULAR | Status: AC
  Administered 2014-12-18: 1 mg via INTRAVENOUS
  Filled 2014-12-18: qty 1

## 2014-12-18 MED ORDER — IOHEXOL 300 MG/ML  SOLN
100.0000 mL | Freq: Once | INTRAMUSCULAR | Status: AC | PRN
  Administered 2014-12-18: 100 mL via INTRAVENOUS

## 2014-12-18 MED ORDER — IOHEXOL 300 MG/ML  SOLN
50.0000 mL | Freq: Once | INTRAMUSCULAR | Status: AC | PRN
  Administered 2014-12-18: 50 mL via ORAL

## 2014-12-18 MED ORDER — CIPROFLOXACIN IN D5W 400 MG/200ML IV SOLN
400.0000 mg | Freq: Once | INTRAVENOUS | Status: AC
  Administered 2014-12-18: 400 mg via INTRAVENOUS
  Filled 2014-12-18: qty 200

## 2014-12-18 NOTE — ED Provider Notes (Signed)
CSN: [id number]     Arrival date & time 12/18/14  1531 History   First MD Initiated Contact with Patient 12/18/14 1558     Chief Complaint  Patient presents with  . Abdominal Pain     (Consider location/radiation/quality/duration/timing/severity/associated sxs/prior Treatment) HPI Patient presents to the emergency department with left-sided lower abdominal pain that started 5 days ago..  The patient states that she started having abdominal pain that was worsening over the last 5 days.  Patient, states she has also had nausea and vomiting.  Patient states she has had a history of diverticulitis and this feels similar.  The patient states over the last several months.  She has had intermittent chest heaviness but is currently not having any chest pain.  Patient denies headache, blurred vision, weakness, numbness, dizziness, back pain, neck pain, dysuria, incontinence, fever, shortness of breath, diaphoresis, or syncope.  Patient states she did not take any medications prior to arrival.  Palpation makes the pain worse Past Medical History  Diagnosis Date  . Hypertension   . Migraine     history  . Allergy   . Diverticulitis    Past Surgical History  Procedure Laterality Date  . Cesarean section    . Fracture surgery    . Partial hysterectomy      Abdominal  . Cholecystectomy    . Rotator cuff repair Left    Family History  Problem Relation Age of Onset  . Hypertension Father   . Diabetes Father     adult onset  . Hypertension Mother   . Breast cancer Mother     breast  . Colon cancer Father   . Asthma Maternal Grandfather    History  Substance Use Topics  . Smoking status: Never Smoker   . Smokeless tobacco: Not on file  . Alcohol Use: No   OB History    No data available     Review of Systems All other systems negative except as documented in the HPI. All pertinent positives and negatives as reviewed in the HPI.  Allergies  Latex and Penicillins  Home Medications    Prior to Admission medications   Medication Sig Start Date End Date Taking? Authorizing Provider  amLODipine (NORVASC) 5 MG tablet TAKE 1 TABLET DAILY 10/21/14   Dorena Cookey, MD  aspirin 81 MG tablet Take 81 mg by mouth daily.    Historical Provider, MD  ibuprofen (ADVIL,MOTRIN) 200 MG tablet Take 400 mg by mouth every 6 (six) hours as needed (for pain).    Historical Provider, MD  MELATONIN PO Take 1 tablet by mouth at bedtime as needed (for pain).    Historical Provider, MD  ondansetron (ZOFRAN-ODT) 8 MG disintegrating tablet Take 1 tablet (8 mg total) by mouth every 8 (eight) hours as needed for nausea or vomiting. 06/19/14   Davonna Belling, MD  oxyCODONE-acetaminophen (PERCOCET/ROXICET) 5-325 MG per tablet Take 1-2 tablets by mouth every 6 (six) hours as needed for severe pain. 06/19/14   Davonna Belling, MD  potassium chloride SA (K-DUR,KLOR-CON) 20 MEQ tablet TAKE 1 TABLET EVERY MORNING 10/21/14   Dorena Cookey, MD  ramipril (ALTACE) 10 MG capsule TAKE 1 CAPSULE DAILY 10/21/14   Dorena Cookey, MD   BP 131/80 mmHg  Pulse 102  Temp(Src) 99.3 F (37.4 C) (Oral)  Resp 16  SpO2 93% Physical Exam  Constitutional: She is oriented to person, place, and time. She appears well-developed and well-nourished. No distress.  HENT:  Head: Normocephalic and  atraumatic.  Mouth/Throat: Oropharynx is clear and moist.  Eyes: Pupils are equal, round, and reactive to light.  Neck: Normal range of motion. Neck supple.  Cardiovascular: Normal rate, regular rhythm and normal heart sounds.  Exam reveals no gallop and no friction rub.   No murmur heard. Pulmonary/Chest: Effort normal and breath sounds normal. No respiratory distress.  Abdominal: Soft. Bowel sounds are normal. She exhibits no distension. There is tenderness. There is guarding. There is no rebound.  Musculoskeletal: She exhibits no edema.  Neurological: She is alert and oriented to person, place, and time. She exhibits normal muscle  tone. Coordination normal.  Skin: Skin is warm and dry. No rash noted. No erythema.  Nursing note and vitals reviewed.   ED Course  Procedures (including critical care time) Labs Review Labs Reviewed  CBC WITH DIFFERENTIAL/PLATELET - Abnormal; Notable for the following:    WBC 10.6 (*)    Monocytes Absolute 1.2 (*)    All other components within normal limits  COMPREHENSIVE METABOLIC PANEL - Abnormal; Notable for the following:    Glucose, Bld 122 (*)    All other components within normal limits  URINALYSIS, ROUTINE W REFLEX MICROSCOPIC - Abnormal; Notable for the following:    Ketones, ur 15 (*)    All other components within normal limits  LIPASE, BLOOD  TROPONIN I  TROPONIN I  TROPONIN I  MAGNESIUM  PHOSPHORUS  TSH  COMPREHENSIVE METABOLIC PANEL  CBC  TROPONIN I  TROPONIN I  HEMOGLOBIN A1C    Imaging Review Ct Abdomen Pelvis W Contrast  12/18/2014   CLINICAL DATA:  Mid abdominal pain and nausea for 5 days  EXAM: CT ABDOMEN AND PELVIS WITH CONTRAST  TECHNIQUE: Multidetector CT imaging of the abdomen and pelvis was performed using the standard protocol following bolus administration of intravenous contrast.  CONTRAST:  45mL OMNIPAQUE IOHEXOL 300 MG/ML SOLN, 154mL OMNIPAQUE IOHEXOL 300 MG/ML SOLN  COMPARISON:  06/19/2014  FINDINGS: Lower chest: Minimal dependent bibasilar atelectasis. Lung bases are otherwise clear.  Hepatobiliary: Evidence of presumed cholecystectomy. Mild central intrahepatic ductal prominence noted. Liver is unremarkable otherwise.  Pancreas: Normal  Spleen: 5 mm hypodense lesion at the mid spleen image 21 is reidentified, statistically a benign finding such as lymphangioma or hemangioma.  Adrenals/Urinary Tract: Adrenal glands are unremarkable. Left upper renal pole cortical cyst measuring 3.9 cm image 31 reidentified. No hydroureteronephrosis. No radiopaque renal, ureteral, or bladder calculus.  Stomach/Bowel: Stomach appears normal. There is short segmental  descending colonic wall thickening image 59 centered around a diverticulum with surrounding pericolonic stranding but no rim enhancing fluid collection to suggest abscess formation. Trace fluid in the left pericolic gutter is noted.  Appendix appears normal.  Small bowel is unremarkable.  Vascular/Lymphatic: No lymphadenopathy.  No aortic aneurysm.  Reproductive: Left ovary appears normal. Uterus presumed surgically absent. Right ovary not visualized, no adnexal mass is seen.  Other: No free air.  Musculoskeletal: No acute osseous abnormality.  IMPRESSION: Short segmental mid descending colonic wall thickening centered around a diverticulum, most compatible with diverticulitis. Trace pericolonic stranding but no fluid collection to suggest abscess formation. No free air.  If the patient has not had routine recent screening colonoscopy, this would be recommended when the patient symptoms resolve to exclude a possible underlying neoplasm but could appear similar.   Electronically Signed   By: Conchita Paris M.D.   On: 12/18/2014 18:29  patient be admitted to the hospital for further evaluation and care of her diverticulitis.  The patient  has had continued persistent significant pain with nausea and vomiting here in the emergency department.  She is given IV antibiotics.    Dalia Heading, PA-C 12/19/14 0130  Lacretia Leigh, MD 12/19/14 [phone]

## 2014-12-18 NOTE — ED Notes (Signed)
Pt reports abd pain with nausea since Monday.  Pt reports hx of diverticulitis.  Reports pain has gotten worse gradually.  Pt also reports chest heaviness while waiting in triage.  States "I think it's anxiety."  Has had this same chest heaviness earlier today as well.

## 2014-12-18 NOTE — H&P (Signed)
PCP: Joycelyn Man, MD  [facility]  Referring provider Irena Cords   Chief Complaint:   Abdominal pain  HPI: Cheryl Barnes is a 64 y.o. female   has a past medical history of Hypertension; Migraine; Allergy; and Diverticulitis.   Presented with  A she apparently recently was diagnosed with diverticulitis by [place] doctor in [facility] and has finished her antibiotic course 4 days ago. She has been treated recently with ciprofloxacin and Flagyl. Patient's symptoms of nausea vomiting, diarrhea and abdominal pain has persisted. She presented to emergency department. Patient denies any chest pain no shortness of breath. She reports some fevers at home. Decreased appetite. In emergency department she had a CT scan done showing short segmental mid descending colonic wall thickening compatible with diverticulitis no abscess.  Patient reports having some intermittent Chest pain first episode was 1.5 weeks ago lasting 2 min, today she felt some chest pressure as well and felt slight chest pain lasting about 5 min. She is concerned given that her mother had an MI at he age of 75.  Patient reports sometimes when she does yard work she gets some chest pain.  ECG showing LBBB nw from prior, no current chest pain  Of note she has hx of pulmonary nodule followed by Dr. Gwenette Greet  Hospitalist was called for admission for diverticulitis  Review of Systems:    Pertinent positives include: Fevers, chills, fatigue,  abdominal pain, nausea, vomiting, diarrhea, chest pain  Constitutional:  No weight loss, night sweats, weight loss  HEENT:  No headaches, Difficulty swallowing,Tooth/dental problems,Sore throat,  No sneezing, itching, ear ache, nasal congestion, post nasal drip,  Cardio-vascular:    Orthopnea, PND, anasarca, dizziness, palpitations.no Bilateral lower extremity swelling  GI:  No heartburn, indigestion,, change in bowel  habits, loss of appetite, melena, blood in stool, hematemesis Resp:  no shortness of breath at rest. No dyspnea on exertion, No excess mucus, no productive cough, No non-productive cough, No coughing up of blood.No change in color of mucus.No wheezing. Skin:  no rash or lesions. No jaundice GU:  no dysuria, change in color of urine, no urgency or frequency. No straining to urinate.  No flank pain.  Musculoskeletal:  No joint pain or no joint swelling. No decreased range of motion. No back pain.  Psych:  No change in mood or affect. No depression or anxiety. No memory loss.  Neuro: no localizing neurological complaints, no tingling, no weakness, no double vision, no gait abnormality, no slurred speech, no confusion  Otherwise ROS are negative except for above, 10 systems were reviewed  Past Medical History: Past Medical History  Diagnosis Date  . Hypertension   . Migraine     history  . Allergy   . Diverticulitis    Past Surgical History  Procedure Laterality Date  . Cesarean section    . Fracture surgery    . Partial hysterectomy      Abdominal  . Cholecystectomy    . Rotator cuff repair Left      Medications: Prior to Admission medications   Medication Sig Start Date End Date Taking? Authorizing Provider  amLODipine (NORVASC) 5 MG tablet TAKE 1 TABLET DAILY 10/21/14  Yes Dorena Cookey, MD  MAGNESIUM PO Take 1 tablet by mouth daily.   Yes Historical Provider, MD  MELATONIN PO Take 1 tablet by mouth at bedtime as needed (for pain).   Yes Historical Provider, MD  potassium chloride SA (  K-DUR,KLOR-CON) 20 MEQ tablet TAKE 1 TABLET EVERY MORNING 10/21/14  Yes Dorena Cookey, MD  ramipril (ALTACE) 10 MG capsule TAKE 1 CAPSULE DAILY 10/21/14  Yes Dorena Cookey, MD  VITAMIN E PO Take 1 tablet by mouth daily.   Yes Historical Provider, MD  ondansetron (ZOFRAN-ODT) 8 MG disintegrating tablet Take 1 tablet (8 mg total) by mouth every 8 (eight) hours as needed for nausea or  vomiting. Patient not taking: Reported on 12/18/2014 06/19/14   Davonna Belling, MD  oxyCODONE-acetaminophen (PERCOCET/ROXICET) 5-325 MG per tablet Take 1-2 tablets by mouth every 6 (six) hours as needed for severe pain. Patient not taking: Reported on 12/18/2014 06/19/14   Davonna Belling, MD    Allergies:   Allergies  Allergen Reactions  . Latex Itching and Rash  . Penicillins Itching and Rash    Social History:  Ambulatory  Independently  Lives at home alone,       reports that she has never smoked. She does not have any smokeless tobacco history on file. She reports that she does not drink alcohol or use illicit drugs.    Family History: family history includes Asthma in her maternal grandfather; Breast cancer in her mother; Colon cancer in her father; Diabetes in her father; Hypertension in her father and mother.    Physical Exam: Patient Vitals for the past 24 hrs:  BP Temp Temp src Pulse Resp SpO2  12/18/14 2200 - - - 74 21 96 %  12/18/14 2130 - - - 72 18 100 %  12/18/14 2115 - - - 78 17 98 %  12/18/14 2100 - - - 81 17 96 %  12/18/14 2045 - - - 83 17 96 %  12/18/14 2030 - - - 83 16 97 %  12/18/14 2015 - - - 87 20 96 %  12/18/14 2000 - - - 79 17 97 %  12/18/14 1945 - - - 86 18 97 %  12/18/14 1930 - - - 79 20 97 %  12/18/14 1918 126/67 mmHg - - 84 23 93 %  12/18/14 1915 - - - 85 21 93 %  12/18/14 1649 126/66 mmHg - - 84 26 96 %  12/18/14 1538 131/80 mmHg 99.3 F (37.4 C) Oral 102 16 93 %    1. General:  in No Acute distress 2. Psychological: Alert and Oriented 3. Head/ENT:  Dry Mucous Membranes                          Head Non traumatic, neck supple                          Normal Dentition 4. SKIN:  decreased Skin turgor,  Skin clean Dry and intact no rash 5. Heart: Regular rate and rhythm no Murmur, Rub or gallop 6. Lungs: Clear to auscultation bilaterally, no wheezes or crackles   7. Abdomen: Soft,  tender, Non distended 8. Lower extremities: no  clubbing, cyanosis, or edema 9. Neurologically Grossly intact, moving all 4 extremities equally 10. MSK: Normal range of motion  body mass index is unknown because there is no weight on file.   Labs on Admission:   Results for orders placed or performed during the hospital encounter of 12/18/14 (from the past 24 hour(s))  CBC with Differential     Status: Abnormal   Collection Time: 12/18/14  4:05 PM  Result Value Ref Range   WBC 10.6 (H)  4.0 - 10.5 K/uL   RBC 4.56 3.87 - 5.11 MIL/uL   Hemoglobin 14.1 12.0 - 15.0 g/dL   HCT 42.1 36.0 - 46.0 %   MCV 92.3 78.0 - 100.0 fL   MCH 30.9 26.0 - 34.0 pg   MCHC 33.5 30.0 - 36.0 g/dL   RDW 12.9 11.5 - 15.5 %   Platelets 164 150 - 400 K/uL   Neutrophils Relative % 73 43 - 77 %   Neutro Abs 7.7 1.7 - 7.7 K/uL   Lymphocytes Relative 16 12 - 46 %   Lymphs Abs 1.7 0.7 - 4.0 K/uL   Monocytes Relative 11 3 - 12 %   Monocytes Absolute 1.2 (H) 0.1 - 1.0 K/uL   Eosinophils Relative 0 0 - 5 %   Eosinophils Absolute 0.0 0.0 - 0.7 K/uL   Basophils Relative 0 0 - 1 %   Basophils Absolute 0.0 0.0 - 0.1 K/uL  Comprehensive metabolic panel     Status: Abnormal   Collection Time: 12/18/14  4:05 PM  Result Value Ref Range   Sodium 138 135 - 145 mmol/L   Potassium 4.0 3.5 - 5.1 mmol/L   Chloride 106 96 - 112 mmol/L   CO2 24 19 - 32 mmol/L   Glucose, Bld 122 (H) 70 - 99 mg/dL   BUN 11 6 - 23 mg/dL   Creatinine, Ser 0.63 0.50 - 1.10 mg/dL   Calcium 9.2 8.4 - 10.5 mg/dL   Total Protein 8.0 6.0 - 8.3 g/dL   Albumin 4.6 3.5 - 5.2 g/dL   AST 20 0 - 37 U/L   ALT 18 0 - 35 U/L   Alkaline Phosphatase 92 39 - 117 U/L   Total Bilirubin 0.8 0.3 - 1.2 mg/dL   GFR calc non Af Amer >90 >90 mL/min   GFR calc Af Amer >90 >90 mL/min   Anion gap 8 5 - 15  Lipase, blood     Status: None   Collection Time: 12/18/14  4:05 PM  Result Value Ref Range   Lipase 25 11 - 59 U/L  Urinalysis, Routine w reflex microscopic     Status: Abnormal   Collection Time: 12/18/14   5:08 PM  Result Value Ref Range   Color, Urine YELLOW YELLOW   APPearance CLEAR CLEAR   Specific Gravity, Urine 1.015 1.005 - 1.030   pH 7.5 5.0 - 8.0   Glucose, UA NEGATIVE NEGATIVE mg/dL   Hgb urine dipstick NEGATIVE NEGATIVE   Bilirubin Urine NEGATIVE NEGATIVE   Ketones, ur 15 (A) NEGATIVE mg/dL   Protein, ur NEGATIVE NEGATIVE mg/dL   Urobilinogen, UA 0.2 0.0 - 1.0 mg/dL   Nitrite NEGATIVE NEGATIVE   Leukocytes, UA NEGATIVE NEGATIVE    UA 15 ketones otherwise no evidence of UTI  No results found for: HGBA1C  CrCl cannot be calculated (Unknown ideal weight.).  BNP (last 3 results) No results for input(s): PROBNP in the last 8760 hours.  Other results:  I have pearsonaly reviewed this: ECG REPORT  Rate: 91  Rhythm: Left bundle-branch block ST&T Change: N/A QTC 487  There were no vitals filed for this visit.   Cultures: No results found for: SDES, SPECREQUEST, CULT, REPTSTATUS   Radiological Exams on Admission: Ct Abdomen Pelvis W Contrast  12/18/2014   CLINICAL DATA:  Mid abdominal pain and nausea for 5 days  EXAM: CT ABDOMEN AND PELVIS WITH CONTRAST  TECHNIQUE: Multidetector CT imaging of the abdomen and pelvis was performed using the standard protocol  following bolus administration of intravenous contrast.  CONTRAST:  39mL OMNIPAQUE IOHEXOL 300 MG/ML SOLN, 111mL OMNIPAQUE IOHEXOL 300 MG/ML SOLN  COMPARISON:  06/19/2014  FINDINGS: Lower chest: Minimal dependent bibasilar atelectasis. Lung bases are otherwise clear.  Hepatobiliary: Evidence of presumed cholecystectomy. Mild central intrahepatic ductal prominence noted. Liver is unremarkable otherwise.  Pancreas: Normal  Spleen: 5 mm hypodense lesion at the mid spleen image 21 is reidentified, statistically a benign finding such as lymphangioma or hemangioma.  Adrenals/Urinary Tract: Adrenal glands are unremarkable. Left upper renal pole cortical cyst measuring 3.9 cm image 31 reidentified. No hydroureteronephrosis. No  radiopaque renal, ureteral, or bladder calculus.  Stomach/Bowel: Stomach appears normal. There is short segmental descending colonic wall thickening image 59 centered around a diverticulum with surrounding pericolonic stranding but no rim enhancing fluid collection to suggest abscess formation. Trace fluid in the left pericolic gutter is noted.  Appendix appears normal.  Small bowel is unremarkable.  Vascular/Lymphatic: No lymphadenopathy.  No aortic aneurysm.  Reproductive: Left ovary appears normal. Uterus presumed surgically absent. Right ovary not visualized, no adnexal mass is seen.  Other: No free air.  Musculoskeletal: No acute osseous abnormality.  IMPRESSION: Short segmental mid descending colonic wall thickening centered around a diverticulum, most compatible with diverticulitis. Trace pericolonic stranding but no fluid collection to suggest abscess formation. No free air.  If the patient has not had routine recent screening colonoscopy, this would be recommended when the patient symptoms resolve to exclude a possible underlying neoplasm but could appear similar.   Electronically Signed   By: Conchita Paris M.D.   On: 12/18/2014 18:29    Chart has been reviewed  Family  at  Bedside  plan of care was discussed with  Kaydence Menard [phone] Assessment/Plan  64 yo F with hx of HTN here with diverticulitis and atypical chest pain with new LBBB   Present on Admission:  . Diverticulitis of colon - IV flagyl and cipro . Essential hypertension, soft BP in ER down to 80's likely due to morphine but will  Hold home meds Atypical chest pain will cycle CE, LBBB  Prophylaxis:   Lovenox   CODE STATUS:  FULL CODE   as per patient    Disposition:  To home once workup is complete and patient is stable  Other plan as per orders.  I have spent a total of 55 min on this admission  Eira Alpert 12/18/2014, 11:09 PM  [facility]  Pager [phone]   after 2 AM please page floor  coverage PA If 7AM-7PM, please contact the day team taking care of the patient  [email or web address]  Password [email or web address]

## 2014-12-19 DIAGNOSIS — R0789 Other chest pain: Secondary | ICD-10-CM

## 2014-12-19 DIAGNOSIS — R079 Chest pain, unspecified: Secondary | ICD-10-CM | POA: Diagnosis present

## 2014-12-19 LAB — MAGNESIUM: Magnesium: 2 mg/dL (ref 1.5–2.5)

## 2014-12-19 LAB — CBC
HCT: 35.4 % — ABNORMAL LOW (ref 36.0–46.0)
HEMOGLOBIN: 11.7 g/dL — AB (ref 12.0–15.0)
MCH: 30.8 pg (ref 26.0–34.0)
MCHC: 33.1 g/dL (ref 30.0–36.0)
MCV: 93.2 fL (ref 78.0–100.0)
PLATELETS: 140 10*3/uL — AB (ref 150–400)
RBC: 3.8 MIL/uL — AB (ref 3.87–5.11)
RDW: 13.2 % (ref 11.5–15.5)
WBC: 6.5 10*3/uL (ref 4.0–10.5)

## 2014-12-19 LAB — COMPREHENSIVE METABOLIC PANEL
ALBUMIN: 3.5 g/dL (ref 3.5–5.2)
ALT: 15 U/L (ref 0–35)
AST: 14 U/L (ref 0–37)
Alkaline Phosphatase: 65 U/L (ref 39–117)
Anion gap: 4 — ABNORMAL LOW (ref 5–15)
BUN: 11 mg/dL (ref 6–23)
CO2: 26 mmol/L (ref 19–32)
CREATININE: 0.7 mg/dL (ref 0.50–1.10)
Calcium: 8.1 mg/dL — ABNORMAL LOW (ref 8.4–10.5)
Chloride: 110 mmol/L (ref 96–112)
Glucose, Bld: 90 mg/dL (ref 70–99)
Potassium: 3.7 mmol/L (ref 3.5–5.1)
Sodium: 140 mmol/L (ref 135–145)
Total Bilirubin: 0.7 mg/dL (ref 0.3–1.2)
Total Protein: 6.3 g/dL (ref 6.0–8.3)

## 2014-12-19 LAB — TSH: TSH: 0.918 u[IU]/mL (ref 0.350–4.500)

## 2014-12-19 LAB — TROPONIN I
Troponin I: <0.03 ng/mL (ref <0.031)
Troponin I: <0.03 ng/mL (ref <0.031)

## 2014-12-19 LAB — PHOSPHORUS: Phosphorus: 2.4 mg/dL (ref 2.3–4.6)

## 2014-12-19 MED ORDER — SODIUM CHLORIDE 0.9 % IJ SOLN
3.0000 mL | Freq: Two times a day (BID) | INTRAMUSCULAR | Status: DC
  Administered 2014-12-19 – 2014-12-22 (×5): 3 mL via INTRAVENOUS

## 2014-12-19 MED ORDER — CETYLPYRIDINIUM CHLORIDE 0.05 % MT LIQD
7.0000 mL | Freq: Two times a day (BID) | OROMUCOSAL | Status: DC
  Administered 2014-12-19 – 2014-12-20 (×2): 7 mL via OROMUCOSAL

## 2014-12-19 MED ORDER — CIPROFLOXACIN IN D5W 400 MG/200ML IV SOLN
400.0000 mg | Freq: Two times a day (BID) | INTRAVENOUS | Status: DC
  Administered 2014-12-19 – 2014-12-22 (×7): 400 mg via INTRAVENOUS
  Filled 2014-12-19 (×8): qty 200

## 2014-12-19 MED ORDER — ENOXAPARIN SODIUM 40 MG/0.4ML ~~LOC~~ SOLN
40.0000 mg | Freq: Every day | SUBCUTANEOUS | Status: DC
  Administered 2014-12-19 – 2014-12-23 (×5): 40 mg via SUBCUTANEOUS
  Filled 2014-12-19 (×5): qty 0.4

## 2014-12-19 MED ORDER — SODIUM CHLORIDE 0.9 % IV SOLN
INTRAVENOUS | Status: AC
  Administered 2014-12-19 – 2014-12-20 (×2): via INTRAVENOUS

## 2014-12-19 MED ORDER — METRONIDAZOLE IN NACL 5-0.79 MG/ML-% IV SOLN
500.0000 mg | Freq: Three times a day (TID) | INTRAVENOUS | Status: DC
  Administered 2014-12-19 – 2014-12-22 (×11): 500 mg via INTRAVENOUS
  Filled 2014-12-19 (×12): qty 100

## 2014-12-19 MED ORDER — ONDANSETRON HCL 4 MG/2ML IJ SOLN
4.0000 mg | Freq: Four times a day (QID) | INTRAMUSCULAR | Status: DC | PRN
  Administered 2014-12-19 – 2014-12-20 (×3): 4 mg via INTRAVENOUS
  Filled 2014-12-19 (×3): qty 2

## 2014-12-19 MED ORDER — HYDROMORPHONE HCL 1 MG/ML IJ SOLN
1.0000 mg | INTRAMUSCULAR | Status: DC | PRN
  Administered 2014-12-19 – 2014-12-20 (×6): 1 mg via INTRAVENOUS
  Filled 2014-12-19 (×6): qty 1

## 2014-12-19 MED ORDER — POTASSIUM CHLORIDE CRYS ER 20 MEQ PO TBCR
20.0000 meq | EXTENDED_RELEASE_TABLET | Freq: Every morning | ORAL | Status: DC
  Administered 2014-12-19 – 2014-12-23 (×5): 20 meq via ORAL
  Filled 2014-12-19 (×5): qty 1

## 2014-12-19 MED ORDER — CHLORHEXIDINE GLUCONATE 0.12 % MT SOLN
15.0000 mL | Freq: Two times a day (BID) | OROMUCOSAL | Status: DC
  Administered 2014-12-19 – 2014-12-23 (×6): 15 mL via OROMUCOSAL
  Filled 2014-12-19 (×10): qty 15

## 2014-12-19 MED ORDER — ACETAMINOPHEN 650 MG RE SUPP: 650.0000 mg | Freq: Four times a day (QID) | RECTAL | Status: DC | PRN

## 2014-12-19 MED ORDER — HYDROCODONE-ACETAMINOPHEN 5-325 MG PO TABS
1.0000 | ORAL_TABLET | ORAL | Status: DC | PRN
  Administered 2014-12-20 (×2): 1 via ORAL
  Administered 2014-12-21 – 2014-12-22 (×2): 2 via ORAL
  Filled 2014-12-19 (×2): qty 2
  Filled 2014-12-19 (×2): qty 1

## 2014-12-19 MED ORDER — ACETAMINOPHEN 325 MG PO TABS
650.0000 mg | ORAL_TABLET | Freq: Four times a day (QID) | ORAL | Status: DC | PRN
  Filled 2014-12-19: qty 2

## 2014-12-19 MED ORDER — ONDANSETRON HCL 4 MG PO TABS
4.0000 mg | ORAL_TABLET | Freq: Four times a day (QID) | ORAL | Status: DC | PRN
  Administered 2014-12-21: 4 mg via ORAL
  Filled 2014-12-19: qty 1

## 2014-12-19 MED ORDER — ONDANSETRON HCL 4 MG/2ML IJ SOLN
4.0000 mg | Freq: Once | INTRAMUSCULAR | Status: AC
  Administered 2014-12-19: 4 mg via INTRAVENOUS
  Filled 2014-12-19: qty 2

## 2014-12-19 NOTE — Progress Notes (Signed)
Pt arrived to floor room 1515 via stretcher/ alert and oriented X4, gait steady, general weakness. VS  Taken and pt oriented to room and callbell with no complications. Pain 5/10.initial assessment completed. Will continue to monitor throughout shift.

## 2014-12-19 NOTE — Progress Notes (Signed)
[facility] PROGRESS NOTE  Cheryl Barnes [id number]:[id number] DOB: [birth year] DOA: 12/18/2014  PCP: Joycelyn Man, MD  Brief HPI: 64 year old Caucasian female recently finished a course of antibiotics for diverticulitis. She presented with abdominal pain, nausea, vomiting. She underwent a CT scan which shows diverticulitis in the descending colon.  Past medical history:  Past Medical History  Diagnosis Date  . Hypertension   . Migraine     history  . Allergy   . Diverticulitis     Consultants: None  Procedures: None  Antibiotics: Ciprofloxacin and Flagyl 4/23  Subjective: Patient continues to have abdominal pain. 6 out of 10 in intensity. Mostly in the left lower side. No nausea or vomiting this morning. Denies any chest pain.    Objective: Vital Signs  Filed Vitals:   12/18/14 2130 12/18/14 2200 12/19/14 0035 12/19/14 0545  BP:   115/66 112/68  Pulse: 72 74 75 78  Temp:   98.1 F (36.7 C) 98 F (36.7 C)  TempSrc:   Oral Oral  Resp: 18 21 18 18   Height:   5\' 1"  (1.549 m)   Weight:   70.7 kg (155 lb 13.8 oz)   SpO2: 100% 96% 96% 98%    Intake/Output Summary (Last 24 hours) at 12/19/14 3664 Last data filed at 12/19/14 4034  Gross per 24 hour  Intake      0 ml  Output      0 ml  Net      0 ml   Filed Weights   12/19/14 0035  Weight: 70.7 kg (155 lb 13.8 oz)    General appearance: alert, cooperative, appears stated age and no distress Resp: clear to auscultation bilaterally Cardio: regular rate and rhythm, S1, S2 normal, no murmur, click, rub or gallop GI: Abdomen is soft. Tender diffusely, but mainly in the left lower quadrant with some guarding but no rebound, rigidity. Bowel sounds present. No masses or organomegaly. Extremities: extremities normal, atraumatic, no cyanosis or edema Skin: Skin color, texture, turgor normal. No rashes or lesions Neurologic: No focal deficits.  Lab Results:  Basic Metabolic Panel:  Recent Labs Lab  12/18/14 1605 12/19/14 0636  NA 138 140  K 4.0 3.7  CL 106 110  CO2 24 26  GLUCOSE 122* 90  BUN 11 11  CREATININE 0.63 0.70  CALCIUM 9.2 8.1*  MG  --  2.0  PHOS  --  2.4   Liver Function Tests:  Recent Labs Lab 12/18/14 1605 12/19/14 0636  AST 20 14  ALT 18 15  ALKPHOS 92 65  BILITOT 0.8 0.7  PROT 8.0 6.3  ALBUMIN 4.6 3.5    Recent Labs Lab 12/18/14 1605  LIPASE 25   CBC:  Recent Labs Lab 12/18/14 1605 12/19/14 0636  WBC 10.6* 6.5  NEUTROABS 7.7  --   HGB 14.1 11.7*  HCT 42.1 35.4*  MCV 92.3 93.2  PLT 164 140*   Cardiac Enzymes:  Recent Labs Lab 12/18/14 2353 12/19/14 0636  TROPONINI <0.03 <0.03    Studies/Results: Ct Abdomen Pelvis W Contrast  12/18/2014   CLINICAL DATA:  Mid abdominal pain and nausea for 5 days  EXAM: CT ABDOMEN AND PELVIS WITH CONTRAST  TECHNIQUE: Multidetector CT imaging of the abdomen and pelvis was performed using the standard protocol following bolus administration of intravenous contrast.  CONTRAST:  86mL OMNIPAQUE IOHEXOL 300 MG/ML SOLN, 12mL OMNIPAQUE IOHEXOL 300 MG/ML SOLN  COMPARISON:  06/19/2014  FINDINGS: Lower chest: Minimal dependent bibasilar atelectasis. Lung bases are otherwise  clear.  Hepatobiliary: Evidence of presumed cholecystectomy. Mild central intrahepatic ductal prominence noted. Liver is unremarkable otherwise.  Pancreas: Normal  Spleen: 5 mm hypodense lesion at the mid spleen image 21 is reidentified, statistically a benign finding such as lymphangioma or hemangioma.  Adrenals/Urinary Tract: Adrenal glands are unremarkable. Left upper renal pole cortical cyst measuring 3.9 cm image 31 reidentified. No hydroureteronephrosis. No radiopaque renal, ureteral, or bladder calculus.  Stomach/Bowel: Stomach appears normal. There is short segmental descending colonic wall thickening image 59 centered around a diverticulum with surrounding pericolonic stranding but no rim enhancing fluid collection to suggest abscess  formation. Trace fluid in the left pericolic gutter is noted.  Appendix appears normal.  Small bowel is unremarkable.  Vascular/Lymphatic: No lymphadenopathy.  No aortic aneurysm.  Reproductive: Left ovary appears normal. Uterus presumed surgically absent. Right ovary not visualized, no adnexal mass is seen.  Other: No free air.  Musculoskeletal: No acute osseous abnormality.  IMPRESSION: Short segmental mid descending colonic wall thickening centered around a diverticulum, most compatible with diverticulitis. Trace pericolonic stranding but no fluid collection to suggest abscess formation. No free air.  If the patient has not had routine recent screening colonoscopy, this would be recommended when the patient symptoms resolve to exclude a possible underlying neoplasm but could appear similar.   Electronically Signed   By: Conchita Paris M.D.   On: 12/18/2014 18:29    Medications:  Scheduled: . ciprofloxacin  400 mg Intravenous Q12H  . enoxaparin (LOVENOX) injection  40 mg Subcutaneous Daily  . metronidazole  500 mg Intravenous Q8H  . potassium chloride SA  20 mEq Oral q morning - 10a  . sodium chloride  3 mL Intravenous Q12H   Continuous: . sodium chloride 75 mL/hr at 12/19/14 0120   [id number] **OR** acetaminophen, HYDROcodone-acetaminophen, HYDROmorphone (DILAUDID) injection, ondansetron **OR** ondansetron (ZOFRAN) IV  Assessment/Plan:  Active Problems:   Essential hypertension   Diverticulitis of colon   Diverticulitis   Pain in the chest    Acute diverticulitis involving the descending colon Continue with Cipro and Flagyl. Keep her nothing by mouth. This is apparently her third episode within the last few months. She is followed by a gastroenterologist in [place], Dr. Sherrell Puller. Pain management.  Chest pain with new LBBB on EKG Troponins are negative. Chest pain has resolved. Await echocardiogram. She will require cardiac workup, but timing will depend on echocardiogram  report.  History of essential hypertension Blood pressure is reasonably well controlled. Continue to monitor  DVT Prophylaxis: Lovenox    Code Status: Full code  Family Communication: Discussed with the patient  Disposition Plan: Not ready for discharge.   Follow-up Appointment?: Will need to follow-up with her gastroenterologist and with cardiology depending on echocardiogram report.    LOS: 1 day   [facility] Pager [phone] 12/19/2014, 9:29 AM  If 7PM-7AM, please contact night-coverage at [email or web address], password [facility]

## 2014-12-20 DIAGNOSIS — I10 Essential (primary) hypertension: Secondary | ICD-10-CM

## 2014-12-20 LAB — BASIC METABOLIC PANEL
ANION GAP: 6 (ref 5–15)
BUN: 10 mg/dL (ref 6–23)
CHLORIDE: 108 mmol/L (ref 96–112)
CO2: 25 mmol/L (ref 19–32)
Calcium: 8.3 mg/dL — ABNORMAL LOW (ref 8.4–10.5)
Creatinine, Ser: 0.69 mg/dL (ref 0.50–1.10)
GFR calc Af Amer: >90 mL/min (ref >90)
GFR calc non Af Amer: >90 mL/min (ref >90)
Glucose, Bld: 76 mg/dL (ref 70–99)
Potassium: 4.1 mmol/L (ref 3.5–5.1)
SODIUM: 139 mmol/L (ref 135–145)

## 2014-12-20 LAB — HEMOGLOBIN A1C
Hgb A1c MFr Bld: 5.5 % (ref 4.8–5.6)
Mean Plasma Glucose: 111 mg/dL

## 2014-12-20 LAB — CBC
HCT: 39.3 % (ref 36.0–46.0)
HEMATOCRIT: 35.3 % — AB (ref 36.0–46.0)
HEMOGLOBIN: 12.9 g/dL (ref 12.0–15.0)
Hemoglobin: 11.8 g/dL — ABNORMAL LOW (ref 12.0–15.0)
MCH: 31.1 pg (ref 26.0–34.0)
MCH: 30.1 pg (ref 26.0–34.0)
MCHC: 33.4 g/dL (ref 30.0–36.0)
MCHC: 32.8 g/dL (ref 30.0–36.0)
MCV: 93.1 fL (ref 78.0–100.0)
MCV: 91.6 fL (ref 78.0–100.0)
Platelets: 142 10*3/uL — ABNORMAL LOW (ref 150–400)
Platelets: 151 10*3/uL (ref 150–400)
RBC: 3.79 MIL/uL — ABNORMAL LOW (ref 3.87–5.11)
RBC: 4.29 MIL/uL (ref 3.87–5.11)
RDW: 12.7 % (ref 11.5–15.5)
RDW: 12.4 % (ref 11.5–15.5)
WBC: 5.2 10*3/uL (ref 4.0–10.5)
WBC: 5.3 10*3/uL (ref 4.0–10.5)

## 2014-12-20 LAB — OCCULT BLOOD X 1 CARD TO LAB, STOOL: Fecal Occult Bld: NEGATIVE

## 2014-12-20 MED ORDER — PROMETHAZINE HCL 25 MG/ML IJ SOLN
12.5000 mg | Freq: Once | INTRAMUSCULAR | Status: AC
  Administered 2014-12-20: 12.5 mg via INTRAVENOUS
  Filled 2014-12-20: qty 1

## 2014-12-20 NOTE — Progress Notes (Signed)
  Echocardiogram 2D Echocardiogram has been performed.  Cheryl Barnes 12/20/2014, 2:51 PM

## 2014-12-20 NOTE — Progress Notes (Signed)
[facility] PROGRESS NOTE  Cheryl Barnes HLK:[id number] DOB: [birth year] DOA: 12/18/2014  PCP: Joycelyn Man, MD  Brief HPI: 64 year old Caucasian female recently finished a course of antibiotics for diverticulitis. She presented with abdominal pain, nausea, vomiting. She underwent a CT scan which showed diverticulitis in the descending colon.  Past medical history:  Past Medical History  Diagnosis Date  . Hypertension   . Migraine     history  . Allergy   . Diverticulitis     Consultants: None  Procedures: None  Antibiotics: Ciprofloxacin and Flagyl 4/23  Subjective: Patient feels better this morning. Pain is present but improved. No nausea, vomiting. Denies any diarrhea. No chest pain. Requesting something to eat.   Objective: Vital Signs  Filed Vitals:   12/19/14 1328 12/19/14 1939 12/19/14 2354 12/20/14 0325  BP: 96/62 112/64 113/58 109/57  Pulse: 68 77 81 71  Temp: 98 F (36.7 C) 99.3 F (37.4 C) 99.1 F (37.3 C) 99.1 F (37.3 C)  TempSrc: Oral Oral Oral Oral  Resp: 18 17 19 16   Height:      Weight:      SpO2: 94% 97% 92% 91%    Intake/Output Summary (Last 24 hours) at 12/20/14 0820 Last data filed at 12/20/14 0148  Gross per 24 hour  Intake   1250 ml  Output    125 ml  Net   1125 ml   Filed Weights   12/19/14 0035  Weight: 70.7 kg (155 lb 13.8 oz)    General appearance: alert, cooperative, appears stated age and no distress Resp: clear to auscultation bilaterally Cardio: regular rate and rhythm, S1, S2 normal, no murmur, click, rub or gallop GI: Abdomen is soft. Continues to be tender in the left lower quadrant. No rebound, rigidity. No masses or organomegaly. Bowel sounds present.  Extremities: extremities normal, atraumatic, no cyanosis or edema Neurologic: No focal deficits.  Lab Results:  Basic Metabolic Panel:  Recent Labs Lab 12/18/14 1605 12/19/14 0636 12/20/14 0558  NA 138 140 139  K 4.0 3.7 4.1  CL 106 110 108    CO2 24 26 25   GLUCOSE 122* 90 76  BUN 11 11 10   CREATININE 0.63 0.70 0.69  CALCIUM 9.2 8.1* 8.3*  MG  --  2.0  --   PHOS  --  2.4  --    Liver Function Tests:  Recent Labs Lab 12/18/14 1605 12/19/14 0636  AST 20 14  ALT 18 15  ALKPHOS 92 65  BILITOT 0.8 0.7  PROT 8.0 6.3  ALBUMIN 4.6 3.5    Recent Labs Lab 12/18/14 1605  LIPASE 25   CBC:  Recent Labs Lab 12/18/14 1605 12/19/14 0636 12/20/14 0558  WBC 10.6* 6.5 5.2  NEUTROABS 7.7  --   --   HGB 14.1 11.7* 11.8*  HCT 42.1 35.4* 35.3*  MCV 92.3 93.2 93.1  PLT 164 140* 142*   Cardiac Enzymes:  Recent Labs Lab 12/18/14 2353 12/19/14 0636 12/19/14 1150  TROPONINI <0.03 <0.03 <0.03    Studies/Results: Ct Abdomen Pelvis W Contrast  12/18/2014   CLINICAL DATA:  Mid abdominal pain and nausea for 5 days  EXAM: CT ABDOMEN AND PELVIS WITH CONTRAST  TECHNIQUE: Multidetector CT imaging of the abdomen and pelvis was performed using the standard protocol following bolus administration of intravenous contrast.  CONTRAST:  35mL OMNIPAQUE IOHEXOL 300 MG/ML SOLN, 151mL OMNIPAQUE IOHEXOL 300 MG/ML SOLN  COMPARISON:  06/19/2014  FINDINGS: Lower chest: Minimal dependent bibasilar atelectasis. Lung bases are  otherwise clear.  Hepatobiliary: Evidence of presumed cholecystectomy. Mild central intrahepatic ductal prominence noted. Liver is unremarkable otherwise.  Pancreas: Normal  Spleen: 5 mm hypodense lesion at the mid spleen image 21 is reidentified, statistically a benign finding such as lymphangioma or hemangioma.  Adrenals/Urinary Tract: Adrenal glands are unremarkable. Left upper renal pole cortical cyst measuring 3.9 cm image 31 reidentified. No hydroureteronephrosis. No radiopaque renal, ureteral, or bladder calculus.  Stomach/Bowel: Stomach appears normal. There is short segmental descending colonic wall thickening image 59 centered around a diverticulum with surrounding pericolonic stranding but no rim enhancing fluid  collection to suggest abscess formation. Trace fluid in the left pericolic gutter is noted.  Appendix appears normal.  Small bowel is unremarkable.  Vascular/Lymphatic: No lymphadenopathy.  No aortic aneurysm.  Reproductive: Left ovary appears normal. Uterus presumed surgically absent. Right ovary not visualized, no adnexal mass is seen.  Other: No free air.  Musculoskeletal: No acute osseous abnormality.  IMPRESSION: Short segmental mid descending colonic wall thickening centered around a diverticulum, most compatible with diverticulitis. Trace pericolonic stranding but no fluid collection to suggest abscess formation. No free air.  If the patient has not had routine recent screening colonoscopy, this would be recommended when the patient symptoms resolve to exclude a possible underlying neoplasm but could appear similar.   Electronically Signed   By: Conchita Paris M.D.   On: 12/18/2014 18:29    Medications:  Scheduled: . antiseptic oral rinse  7 mL Mouth Rinse q12n4p  . chlorhexidine  15 mL Mouth Rinse BID  . ciprofloxacin  400 mg Intravenous Q12H  . enoxaparin (LOVENOX) injection  40 mg Subcutaneous Daily  . metronidazole  500 mg Intravenous Q8H  . potassium chloride SA  20 mEq Oral q morning - 10a  . sodium chloride  3 mL Intravenous Q12H   Continuous: . sodium chloride 75 mL/hr at 12/20/14 0227   [id number] **OR** acetaminophen, HYDROcodone-acetaminophen, HYDROmorphone (DILAUDID) injection, ondansetron **OR** ondansetron (ZOFRAN) IV  Assessment/Plan:  Active Problems:   Essential hypertension   Diverticulitis of colon   Diverticulitis   Pain in the chest    Acute diverticulitis involving the descending colon Slowly improving. Start clear liquids. If her symptoms get worse, we may have to back off. CT scan was done which did not show any complicating features. Continue with Cipro and Flagyl. This is apparently her third episode within the last few months. She is followed by  a gastroenterologist in [place], Dr. Sherrell Puller. He will need to see him in follow-up. Pain management.  Chest pain with new LBBB on EKG Troponins are negative. Chest pain has resolved. Await echocardiogram. She will require cardiac workup, but timing will depend on echocardiogram report.  History of essential hypertension Blood pressure is reasonably well controlled. Continue to monitor  DVT Prophylaxis: Lovenox    Code Status: Full code  Family Communication: Discussed with the patient  Disposition Plan: Not ready for discharge.   Follow-up Appointment?: Will need to follow-up with her gastroenterologist and with cardiology depending on echocardiogram report.    LOS: 2 days   [facility] Pager [phone] 12/20/2014, 8:20 AM  If 7PM-7AM, please contact night-coverage at [email or web address], password [facility]

## 2014-12-21 ENCOUNTER — Inpatient Hospital Stay (HOSPITAL_COMMUNITY): Payer: BLUE CROSS/BLUE SHIELD

## 2014-12-21 DIAGNOSIS — I447 Left bundle-branch block, unspecified: Secondary | ICD-10-CM

## 2014-12-21 DIAGNOSIS — I209 Angina pectoris, unspecified: Secondary | ICD-10-CM

## 2014-12-21 DIAGNOSIS — Z8249 Family history of ischemic heart disease and other diseases of the circulatory system: Secondary | ICD-10-CM

## 2014-12-21 LAB — BASIC METABOLIC PANEL
Anion gap: 8 (ref 5–15)
BUN: 6 mg/dL (ref 6–23)
CALCIUM: 8.8 mg/dL (ref 8.4–10.5)
CHLORIDE: 108 mmol/L (ref 96–112)
CO2: 25 mmol/L (ref 19–32)
CREATININE: 0.72 mg/dL (ref 0.50–1.10)
GFR, EST NON AFRICAN AMERICAN: 89 mL/min — AB (ref >90)
Glucose, Bld: 92 mg/dL (ref 70–99)
Potassium: 3.6 mmol/L (ref 3.5–5.1)
SODIUM: 141 mmol/L (ref 135–145)

## 2014-12-21 LAB — CBC
HCT: 40.1 % (ref 36.0–46.0)
HEMOGLOBIN: 13.3 g/dL (ref 12.0–15.0)
MCH: 30.1 pg (ref 26.0–34.0)
MCHC: 33.2 g/dL (ref 30.0–36.0)
MCV: 90.7 fL (ref 78.0–100.0)
PLATELETS: 159 10*3/uL (ref 150–400)
RBC: 4.42 MIL/uL (ref 3.87–5.11)
RDW: 12.3 % (ref 11.5–15.5)
WBC: 4.2 10*3/uL (ref 4.0–10.5)

## 2014-12-21 LAB — TROPONIN I: Troponin I: <0.03 ng/mL (ref <0.031)

## 2014-12-21 MED ORDER — SODIUM CHLORIDE 0.9 % IV SOLN
INTRAVENOUS | Status: DC
  Administered 2014-12-21: 09:00:00 via INTRAVENOUS

## 2014-12-21 MED ORDER — NITROGLYCERIN 0.3 MG SL SUBL: 0.3000 mg | SUBLINGUAL_TABLET | SUBLINGUAL | Status: DC | PRN

## 2014-12-21 MED ORDER — NITROGLYCERIN 0.4 MG SL SUBL: 0.4000 mg | SUBLINGUAL_TABLET | SUBLINGUAL | Status: DC | PRN

## 2014-12-21 MED ORDER — CARVEDILOL 3.125 MG PO TABS
3.1250 mg | ORAL_TABLET | Freq: Two times a day (BID) | ORAL | Status: DC
Start: 2014-12-21 — End: 2014-12-23
  Filled 2014-12-21 (×6): qty 1

## 2014-12-21 NOTE — Progress Notes (Addendum)
[facility] PROGRESS NOTE  Cheryl Barnes LZJ:[id number] DOB: 04-15-1951 DOA: 12/18/2014  PCP: Joycelyn Man, MD  Brief HPI: 64 year old Caucasian female recently finished a course of antibiotics for diverticulitis. She presented with abdominal pain, nausea, vomiting. She underwent a CT scan which showed diverticulitis in the descending colon. Her chest pain had resolved, but then she had a brief episode this morning.  Past medical history:  Past Medical History  Diagnosis Date  . Hypertension   . Migraine     history  . Allergy   . Diverticulitis     Consultants: Cardiology  Procedures:  2D ECHO Study Conclusions - Left ventricle: The cavity size was normal. Wall thickness wasnormal. Systolic function was normal. The estimated ejection fraction was in the range of 50% to 55%. Wall motion was normal;there were no regional wall motion abnormalities. Doppler parameters are consistent with abnormal left ventricularrelaxation (grade 1 diastolic dysfunction). - Ventricular septum: Septal motion showed abnormal function anddyssynergy. Impressions: - Technically difficult; septal dyssynergy makes EF difficult toquantitate; overall low normal with apical hypokinesis; grade 1diastolic dysfunction; trace MR and TR.  Antibiotics: Ciprofloxacin and Flagyl 4/23  Subjective: Patient states that her pain is better. She tolerated a liquid diet. But feels weak this morning. She had a few episodes 'blood' in her stool yesterday. RN wasn't completely sure if it was blood or not. She had one small episode this morning. Had a transient episode of chest tightness this morning, which resolved after a few seconds.  Objective: Vital Signs  Filed Vitals:   12/20/14 0325 12/20/14 1515 12/20/14 2150 12/21/14 0551  BP: 109/57 142/69 144/71 139/75  Pulse: 71 74 80 65  Temp: 99.1 F (37.3 C) 98.4 F (36.9 C) 98.1 F (36.7 C) 98.1 F (36.7 C)  TempSrc: Oral Oral  Oral  Resp:  16 18 16 16   Height:      Weight:      SpO2: 91% 95% 96% 95%    Intake/Output Summary (Last 24 hours) at 12/21/14 0911 Last data filed at 12/20/14 2100  Gross per 24 hour  Intake   1640 ml  Output      0 ml  Net   1640 ml   Filed Weights   12/19/14 0035  Weight: 70.7 kg (155 lb 13.8 oz)    General appearance: alert, cooperative, appears stated age and no distress Resp: clear to auscultation bilaterally Cardio: regular rate and rhythm, S1, S2 normal, no murmur, click, rub or gallop GI: Abdomen is soft. Remains quite tender in the left lower quadrant. But no rebound, rigidity. No masses or organomegaly. Bowel sounds present.  Extremities: extremities normal, atraumatic, no cyanosis or edema Neurologic: No focal deficits.  Lab Results:  Basic Metabolic Panel:  Recent Labs Lab 12/18/14 1605 12/19/14 0636 12/20/14 0558 12/21/14 0540  NA 138 140 139 141  K 4.0 3.7 4.1 3.6  CL 106 110 108 108  CO2 24 26 25 25   GLUCOSE 122* 90 76 92  BUN 11 11 10 6   CREATININE 0.63 0.70 0.69 0.72  CALCIUM 9.2 8.1* 8.3* 8.8  MG  --  2.0  --   --   PHOS  --  2.4  --   --    Liver Function Tests:  Recent Labs Lab 12/18/14 1605 12/19/14 0636  AST 20 14  ALT 18 15  ALKPHOS 92 65  BILITOT 0.8 0.7  PROT 8.0 6.3  ALBUMIN 4.6 3.5    Recent Labs Lab 12/18/14 1605  LIPASE  25   CBC:  Recent Labs Lab 12/18/14 1605 12/19/14 0636 12/20/14 0558 12/20/14 1539 12/21/14 0540  WBC 10.6* 6.5 5.2 5.3 4.2  NEUTROABS 7.7  --   --   --   --   HGB 14.1 11.7* 11.8* 12.9 13.3  HCT 42.1 35.4* 35.3* 39.3 40.1  MCV 92.3 93.2 93.1 91.6 90.7  PLT 164 140* 142* 151 159   Cardiac Enzymes:  Recent Labs Lab 12/18/14 2353 12/19/14 0636 12/19/14 1150  TROPONINI <0.03 <0.03 <0.03    Studies/Results: No results found.  Medications:  Scheduled: . antiseptic oral rinse  7 mL Mouth Rinse q12n4p  . chlorhexidine  15 mL Mouth Rinse BID  . ciprofloxacin  400 mg Intravenous Q12H  .  enoxaparin (LOVENOX) injection  40 mg Subcutaneous Daily  . metronidazole  500 mg Intravenous Q8H  . potassium chloride SA  20 mEq Oral q morning - 10a  . sodium chloride  3 mL Intravenous Q12H   Continuous: . sodium chloride     RXY:[id number] **OR** acetaminophen, HYDROcodone-acetaminophen, HYDROmorphone (DILAUDID) injection, ondansetron **OR** ondansetron (ZOFRAN) IV  Assessment/Plan:  Active Problems:   Essential hypertension   Diverticulitis of colon   Diverticulitis   Pain in the chest    Acute diverticulitis involving the descending colon/questionable hematochezia Remains quite tender although pain is better. We will order an abdominal x-ray. No advancement in diet today. CT scan was done which did not show any complicating features. Continue with Cipro and Flagyl. This is apparently her third episode within the last few months. She is followed by a gastroenterologist in [place], Dr. Sherrell Puller. She will need to see him in follow-up. Pain management. She reports blood in the stool. Hemoccult was negative. Hemoglobin is stable. Continue to monitor.  Chest pain with new LBBB on EKG Troponins are negative. Chest pain had resolved, but had a brief episode this morning. Troponin will be repeated. Echocardiogram report has been reviewed. Apical hypokinesis was seen. Since she had another episode of chest tightness we will go ahead and consult cardiology. She will need ischemic workup at some point.  History of essential hypertension Blood pressure is reasonably well controlled. Continue to monitor  DVT Prophylaxis: Lovenox    Code Status: Full code  Family Communication: Discussed with the patient  Disposition Plan: Not ready for discharge.   Follow-up Appointment?: Will need to follow-up with her gastroenterologist    LOS: 3 days   [facility] Pager [phone] 12/21/2014, 9:11 AM  If 7PM-7AM, please contact night-coverage at [email or web address],  password [facility]

## 2014-12-21 NOTE — Consult Note (Addendum)
CARDIOLOGY CONSULT NOTE   Patient ID: BIJOU EASLER MRN: [id number], DOB/AGE: 1950-11-21   Admit date: 12/18/2014 Date of Consult: 12/21/2014  Primary Physician: Joycelyn Man, MD Primary Cardiologist: None  Reason for consult:  Chest pain  Problem List  Past Medical History  Diagnosis Date  . Hypertension   . Migraine     history  . Allergy   . Diverticulitis     Past Surgical History  Procedure Laterality Date  . Cesarean section    . Fracture surgery    . Partial hysterectomy      Abdominal  . Cholecystectomy    . Rotator cuff repair Left      Allergies  Allergies  Allergen Reactions  . Latex Itching and Rash  . Penicillins Itching and Rash    HPI   64 year old female with admitted with diverticulitis, treated recently with ciprofloxacin and Flagy as outpatient and with worsening symptoms.  Patient's symptoms of nausea vomiting, diarrhea and abdominal pain has persisted. She presented to emergency department. In emergency department she had a CT scan done showing short segmental mid descending colonic wall thickening compatible with diverticulitis no abscess.  Patient reports having exertional chest pain with moderate activity in the last 7 months, the last week she had a significant pressure like retrosternal chest pain associated with diaphoresis and SOB.  While she is in the hospital she has had chest pain, the last episode yesterday, radiating to her left arm.   Inpatient Medications  . antiseptic oral rinse  7 mL Mouth Rinse q12n4p  . chlorhexidine  15 mL Mouth Rinse BID  . ciprofloxacin  400 mg Intravenous Q12H  . enoxaparin (LOVENOX) injection  40 mg Subcutaneous Daily  . metronidazole  500 mg Intravenous Q8H  . potassium chloride SA  20 mEq Oral q morning - 10a  . sodium chloride  3 mL Intravenous Q12H    Family History Family History  Problem Relation Age of Onset  . Hypertension Father   . Diabetes Father     adult onset  .  Hypertension Mother   . Breast cancer Mother     breast  . Colon cancer Father   . Asthma Maternal Grandfather      Social History History   Social History  . Marital Status: Single    Spouse Name: N/A  . Number of Children: N/A  . Years of Education: N/A   Occupational History  . ophthalmology @ [employer]    Social History Main Topics  . Smoking status: Never Smoker   . Smokeless tobacco: Not on file  . Alcohol Use: No  . Drug Use: No  . Sexual Activity: Not on file   Other Topics Concern  . Not on file   Social History Narrative     Review of Systems  General:  No chills, fever, night sweats or weight changes.  Cardiovascular:  No chest pain, dyspnea on exertion, edema, orthopnea, palpitations, paroxysmal nocturnal dyspnea. Dermatological: No rash, lesions/masses Respiratory: No cough, dyspnea Urologic: No hematuria, dysuria Abdominal:   No nausea, vomiting, diarrhea, bright red blood per rectum, melena, or hematemesis Neurologic:  No visual changes, wkns, changes in mental status. All other systems reviewed and are otherwise negative except as noted above.  Physical Exam  Blood pressure 139/75, pulse 65, temperature 98.1 F (36.7 C), temperature source Oral, resp. rate 16, height 5\' 1"  (1.549 m), weight 155 lb 13.8 oz (70.7 kg), SpO2 95 %.  General: Pleasant, NAD Psych:  Normal affect. Neuro: Alert and oriented X 3. Moves all extremities spontaneously. HEENT: Normal  Neck: Supple without bruits or JVD. Lungs:  Resp regular and unlabored, CTA. Heart: RRR no s3, s4, or murmurs. Abdomen: Soft, non-tender, non-distended, BS + x 4.  Extremities: No clubbing, cyanosis or edema. DP/PT/Radials 2+ and equal bilaterally.  Labs   Recent Labs  12/18/14 2353 12/19/14 0636 12/19/14 1150  TROPONINI <0.03 <0.03 <0.03   Lab Results  Component Value Date   WBC 4.2 12/21/2014   HGB 13.3 12/21/2014   HCT 40.1 12/21/2014   MCV 90.7 12/21/2014   PLT 159 12/21/2014      Recent Labs Lab 12/19/14 0636  12/21/14 0540  NA 140  < > 141  K 3.7  < > 3.6  CL 110  < > 108  CO2 26  < > 25  BUN 11  < > 6  CREATININE 0.70  < > 0.72  CALCIUM 8.1*  < > 8.8  PROT 6.3  --   --   BILITOT 0.7  --   --   ALKPHOS 65  --   --   ALT 15  --   --   AST 14  --   --   GLUCOSE 90  < > 92  < > = values in this interval not displayed. Lab Results  Component Value Date   CHOL 185 02/23/2013   HDL 48.70 02/23/2013   LDLCALC 116* 02/23/2013   TRIG 103.0 02/23/2013   Radiology/Studies  Ct Abdomen Pelvis W Contrast  12/18/2014   CLINICAL DATA:  Mid abdominal pain and nausea for 5 days   IMPRESSION: Short segmental mid descending colonic wall thickening centered around a diverticulum, most compatible with diverticulitis. Trace pericolonic stranding but no fluid collection to suggest abscess formation. No free air.  If the patient has not had routine recent screening colonoscopy, this would be recommended when the patient symptoms resolve to exclude a possible underlying neoplasm but could appear similar.     Echocardiogram - 12/20/2014  Left ventricle: The cavity size was normal. Wall thickness was normal. Systolic function was normal. The estimated ejection fraction was in the range of 50% to 55%. Wall motion was normal; there were no regional wall motion abnormalities. Doppler parameters are consistent with abnormal left ventricular relaxation (grade 1 diastolic dysfunction). - Ventricular septum: Septal motion showed abnormal function and dyssynergy.  Impressions: - Technically difficult; septal dyssynergy makes EF difficult to quantitate; overall low normal with apical hypokinesis; grade 1 diastolic dysfunction; trace MR and TR.  ECG: SR, LBBB    ASSESSMENT AND PLAN  64 year old female  1. Typical exertional chest pain - with findings of new LBBB on ECG, previous ECG from 2013 showed findings suspicious for anterior ischemia.  This patient  also has significant family h/o premature CAD (in her father), she is considered a high risk. I would proceed directly with left cardiac catheterization instead of stress test. LVEF is preserved on echo performed yesterday.  Timing for the procedure will need to be discussed with the primary team, we don't want to it at the time of an acute infection, but don't want to wait too long considering the patient is symptomatic with on and off chest pain with minimal activities.  I would suggest to do cath on Thursday if ok with the primary team. Discussed with Dr Maryland Pink, she hasn't been improving as fast as expected and are planning to do repeat abdominal X ray. She has  had some blood in the stool, so this needs to be resolved if we are considering cath.  In case she is not improving we would consider a stress test to rule out obstructive CAD prior to discharge.   I would start low dose aspirin 81 mg po daily, hold beta-blockers as she is bradycardic, and hold statins in the settings of acute diverticulitis. If evidence of CAD on cath, we will start statins.   Signed, Dorothy Spark, MD, [facility] 12/21/2014, 10:04 AM

## 2014-12-22 DIAGNOSIS — R109 Unspecified abdominal pain: Secondary | ICD-10-CM | POA: Diagnosis present

## 2014-12-22 DIAGNOSIS — R1032 Left lower quadrant pain: Secondary | ICD-10-CM

## 2014-12-22 DIAGNOSIS — K5732 Diverticulitis of large intestine without perforation or abscess without bleeding: Secondary | ICD-10-CM | POA: Diagnosis present

## 2014-12-22 LAB — CBC
HCT: 40.9 % (ref 36.0–46.0)
Hemoglobin: 13.7 g/dL (ref 12.0–15.0)
MCH: 30.2 pg (ref 26.0–34.0)
MCHC: 33.5 g/dL (ref 30.0–36.0)
MCV: 90.1 fL (ref 78.0–100.0)
PLATELETS: 184 10*3/uL (ref 150–400)
RBC: 4.54 MIL/uL (ref 3.87–5.11)
RDW: 12.5 % (ref 11.5–15.5)
WBC: 4.2 10*3/uL (ref 4.0–10.5)

## 2014-12-22 LAB — BASIC METABOLIC PANEL
ANION GAP: 10 (ref 5–15)
BUN: 9 mg/dL (ref 6–23)
CO2: 24 mmol/L (ref 19–32)
Calcium: 9 mg/dL (ref 8.4–10.5)
Chloride: 107 mmol/L (ref 96–112)
Creatinine, Ser: 0.74 mg/dL (ref 0.50–1.10)
GFR calc non Af Amer: 89 mL/min — ABNORMAL LOW (ref >90)
Glucose, Bld: 97 mg/dL (ref 70–99)
POTASSIUM: 3.4 mmol/L — AB (ref 3.5–5.1)
Sodium: 141 mmol/L (ref 135–145)

## 2014-12-22 LAB — LIPID PANEL
Cholesterol: 131 mg/dL (ref 0–200)
HDL: 37 mg/dL — ABNORMAL LOW (ref >39)
LDL Cholesterol: 75 mg/dL (ref 0–99)
Total CHOL/HDL Ratio: 3.5 RATIO
Triglycerides: 96 mg/dL (ref <150)
VLDL: 19 mg/dL (ref 0–40)

## 2014-12-22 MED ORDER — METRONIDAZOLE 500 MG PO TABS
500.0000 mg | ORAL_TABLET | Freq: Three times a day (TID) | ORAL | Status: DC
  Administered 2014-12-22 – 2014-12-23 (×2): 500 mg via ORAL
  Filled 2014-12-22 (×5): qty 1

## 2014-12-22 MED ORDER — CIPROFLOXACIN HCL 500 MG PO TABS
500.0000 mg | ORAL_TABLET | Freq: Two times a day (BID) | ORAL | Status: DC
  Administered 2014-12-22 – 2014-12-23 (×2): 500 mg via ORAL
  Filled 2014-12-22 (×4): qty 1

## 2014-12-22 NOTE — Progress Notes (Signed)
[facility] PROGRESS NOTE   DONNICA JARNAGIN EYC:[id number] DOB: 1951/01/18 DOA: 12/18/2014 PCP: Joycelyn Man, MD  HPI/Subjective: Pt is doing well this morning. Continuing to have L sided abdominal pain but denies any nausea or vomiting currently. Had a small episode of CP yesterday while at rest, lasted only a few seconds and has not had any since. No other complaints at this time.   Assessment/Plan: Active Problems:   Essential hypertension   Diverticulitis of colon   Diverticulitis   Pain in the chest  Primary Problem: Diverticulitis of Descending Colon: Chronic problem for this pt, finished course of abx 4 days prior to admission but states during that time she continued to have abdominal pain, nausea and vomiting. Pt continues to have L sided abdominal pain. CT abdomen shows diverticulitis of the descending colon, no other complications. Abdominal films are negative for any obstruction. Pt is on IV Cipro and Flagyl currently. Pt has been having acute episodes more frequently recently, 3rd episode within the last few months. Her gastroenterologist is Dr. Sherrell Puller in [facility]. Pt is on clear liquids currently, will transition her to thin fluids tonight with PO abx. If she is able to tolerate this then possibly discharge tomorrow pending approval from cardiology. Regardless of when she is discharged, will have her follow up with Dr. Sherrell Puller regarding her worsening condition.   Active Problems: CP, new LBBB on EKG: Pt reports worsening exertional cp that has now become intermittent cp while at rest. States the pain will only last for a few seconds when she does feel it. Upon admission she was found to have a new LBBB per EKG that was not there on last EKG in 2014. Pt has no cardiac hx prior to this other than the previously stated CP. Troponins have been negative and echo shows EF of 50-55% with grade 1 diastolic dysfunction. Cardiology was consulted and recommends cardiac cath  once diverticulitis stabilizes as pt's condition does not seem acute. See their note for further details.   Hematochezia: Pt reports small amount of what she thought was blood in her stool, no frank blood. Hemoccult was negative and Hgb levels have been stable. Will continue to monitor.   Essential HTN: BP's stable, pt taking coreg. Continue.   Hypokalemia: Pt's K WNL upon arrival and now 3.4, likely due to significant IV hydration. Pt given PO KCl, continue to monitor.   Code Status: Full Code Family Communication: Plan discussed with the patient. Disposition Plan: Remains inpatient, possible discharge tomorrow pending cardiology approval and ability to tolerate increased diet.  Diet: Diet Heart Room service appropriate?: Yes; Fluid consistency:: Thin  Consultants:  Cardiology  Procedures:  None  Antibiotics: Ciprofloxacin and Flagyl IV, start 4/24  Objective: Filed Vitals:   12/22/14 0530  BP: 105/69  Pulse: 78  Temp: 98 F (36.7 C)  Resp: 16    Intake/Output Summary (Last 24 hours) at 12/22/14 1208 Last data filed at 12/22/14 0600  Gross per 24 hour  Intake 1708.33 ml  Output      0 ml  Net 1708.33 ml   Filed Weights   12/19/14 0035  Weight: 70.7 kg (155 lb 13.8 oz)    Exam: General: Alert and awake, oriented x3, not in any acute distress. HEENT: anicteric sclera,  EOMI CVS: S1-S2 clear, no murmur rubs or gallops Chest: clear to auscultation bilaterally, no wheezing, rales or rhonchi Abdomen: exquisite tenderness of L side and suprapubic region. +guarding. soft, nondistended, normal bowel sounds, no organomegaly Extremities:  no cyanosis, clubbing or edema noted bilaterally Neuro: Cranial nerves II-XII intact, no focal neurological deficits  Data Reviewed: Basic Metabolic Panel:  Recent Labs Lab 12/18/14 1605 12/19/14 0636 12/20/14 0558 12/21/14 0540 12/22/14 0545  NA 138 140 139 141 141  K 4.0 3.7 4.1 3.6 3.4*  CL 106 110 108 108 107  CO2 24 26  25 25 24   GLUCOSE 122* 90 76 92 97  BUN 11 11 10 6 9   CREATININE 0.63 0.70 0.69 0.72 0.74  CALCIUM 9.2 8.1* 8.3* 8.8 9.0  MG  --  2.0  --   --   --   PHOS  --  2.4  --   --   --    Liver Function Tests:  Recent Labs Lab 12/18/14 1605 12/19/14 0636  AST 20 14  ALT 18 15  ALKPHOS 92 65  BILITOT 0.8 0.7  PROT 8.0 6.3  ALBUMIN 4.6 3.5    Recent Labs Lab 12/18/14 1605  LIPASE 25   No results for input(s): AMMONIA in the last 168 hours. CBC:  Recent Labs Lab 12/18/14 1605 12/19/14 0636 12/20/14 0558 12/20/14 1539 12/21/14 0540 12/22/14 0545  WBC 10.6* 6.5 5.2 5.3 4.2 4.2  NEUTROABS 7.7  --   --   --   --   --   HGB 14.1 11.7* 11.8* 12.9 13.3 13.7  HCT 42.1 35.4* 35.3* 39.3 40.1 40.9  MCV 92.3 93.2 93.1 91.6 90.7 90.1  PLT 164 140* 142* 151 159 184   Cardiac Enzymes:  Recent Labs Lab 12/18/14 2353 12/19/14 0636 12/19/14 1150 12/21/14 1040  TROPONINI <0.03 <0.03 <0.03 <0.03   BNP (last 3 results) No results for input(s): BNP in the last 8760 hours.  ProBNP (last 3 results) No results for input(s): PROBNP in the last 8760 hours.  CBG: No results for input(s): GLUCAP in the last 168 hours.  Micro No results found for this or any previous visit (from the past 240 hour(s)).   Studies: Dg Abd 2 Views  12/21/2014   CLINICAL DATA:  Right upper and left lower quadrant pain. Nausea. Blood in stool. History of diverticulitis.  EXAM: ABDOMEN - 2 VIEW  COMPARISON:  CT 12/18/2014  FINDINGS: Air is present throughout the colon. There are a few scattered air-fluid levels predominately over the colon. No dilated small bowel loops. No free peritoneal air. Minimal degenerative change of the spine and hips.  IMPRESSION: Nonobstructive bowel gas pattern.   Electronically Signed   By: Marin Olp M.D.   On: 12/21/2014 14:42    Scheduled Meds: . antiseptic oral rinse  7 mL Mouth Rinse q12n4p  . carvedilol  3.125 mg Oral BID WC  . chlorhexidine  15 mL Mouth Rinse BID  .  ciprofloxacin  400 mg Intravenous Q12H  . enoxaparin (LOVENOX) injection  40 mg Subcutaneous Daily  . metronidazole  500 mg Intravenous Q8H  . potassium chloride SA  20 mEq Oral q morning - 10a  . sodium chloride  3 mL Intravenous Q12H   Continuous Infusions: . sodium chloride 50 mL/hr at 12/21/14 0915   Time spent: 35 minutes  Damaris Hippo, PA-S Verlee Monte, MD  [facility] Pager [phone] If 7PM-7AM, please contact night-coverage at [email or web address], password [email or web address] 12/22/2014, 12:08 PM  LOS: 4 days      Addendum  Patient seen and examined, chart and data base reviewed.  I agree with the above assessment and plan.  For full details please see Mr. Damaris Hippo, PA-S note.  I reviewed and amended the above note as appropriate.   Birdie Hopes, MD [facility] Pager: [phone] 12/22/2014, 3:59 PM

## 2014-12-22 NOTE — Progress Notes (Signed)
Patient Name: Cheryl Barnes Date of Encounter: 12/22/2014  Active Problems:   Essential hypertension   Diverticulitis of colon   Diverticulitis   Pain in the chest   Length of Stay: 4  SUBJECTIVE  She feels better today, had another retrosternal chest pain yesterday, continues to have blood in every bowel movement.   CURRENT MEDS . antiseptic oral rinse  7 mL Mouth Rinse q12n4p  . carvedilol  3.125 mg Oral BID WC  . chlorhexidine  15 mL Mouth Rinse BID  . ciprofloxacin  400 mg Intravenous Q12H  . enoxaparin (LOVENOX) injection  40 mg Subcutaneous Daily  . metronidazole  500 mg Intravenous Q8H  . potassium chloride SA  20 mEq Oral q morning - 10a  . sodium chloride  3 mL Intravenous Q12H    OBJECTIVE  Filed Vitals:   12/21/14 0551 12/21/14 1430 12/21/14 2122 12/22/14 0530  BP: 139/75 140/74 117/69 105/69  Pulse: 65 70 79 78  Temp: 98.1 F (36.7 C) 98.3 F (36.8 C) 98.4 F (36.9 C) 98 F (36.7 C)  TempSrc: Oral Oral Oral Oral  Resp: 16 18 18 16   Height:      Weight:      SpO2: 95% 98% 96% 98%    Intake/Output Summary (Last 24 hours) at 12/22/14 0836 Last data filed at 12/22/14 0600  Gross per 24 hour  Intake 1708.33 ml  Output      0 ml  Net 1708.33 ml   Filed Weights   12/19/14 0035  Weight: 155 lb 13.8 oz (70.7 kg)    PHYSICAL EXAM  General: Pleasant, NAD. Neuro: Alert and oriented X 3. Moves all extremities spontaneously. Psych: Normal affect. HEENT:  Normal  Neck: Supple without bruits or JVD. Lungs:  Resp regular and unlabored, CTA. Heart: RRR no s3, s4, or murmurs. Abdomen: Soft, non-tender, non-distended, BS + x 4.  Extremities: No clubbing, cyanosis or edema. DP/PT/Radials 2+ and equal bilaterally.  Accessory Clinical Findings  CBC  Recent Labs  12/21/14 0540 12/22/14 0545  WBC 4.2 4.2  HGB 13.3 13.7  HCT 40.1 40.9  MCV 90.7 90.1  PLT 159 361   Basic Metabolic Panel  Recent Labs  12/21/14 0540 12/22/14 0545  NA 141  141  K 3.6 3.4*  CL 108 107  CO2 25 24  GLUCOSE 92 97  BUN 6 9  CREATININE 0.72 0.74  CALCIUM 8.8 9.0   Cardiac Enzymes  Recent Labs  12/19/14 1150 12/21/14 1040  TROPONINI <0.03 <0.03    Recent Labs  12/22/14 0005  CHOL 131  HDL 37*  LDLCALC 75  TRIG 96  CHOLHDL 3.5    Radiology/Studies  Dg Abd 2 Views  12/21/2014   CLINICAL DATA:  Right upper and left lower quadrant pain. Nausea. Blood in stool. History of diverticulitis.  EXAM: ABDOMEN - 2 VIEW  COMPARISON:  CT 12/18/2014  FINDINGS: Air is present throughout the colon. There are a few scattered air-fluid levels predominately over the colon. No dilated small bowel loops. No free peritoneal air. Minimal degenerative change of the spine and hips.  IMPRESSION: Nonobstructive bowel gas pattern.   Electronically Signed   By: Marin Olp M.D.   On: 12/21/2014 14:42    TELE:     ASSESSMENT AND PLAN  64 year old female  1. Typical exertional chest pain - with findings of new LBBB on ECG, previous ECG from 2013 showed findings suspicious for anterior ischemia.  This patient also has  significant family h/o premature CAD (in her father), she is considered a high risk. I would proceed directly with left cardiac catheterization instead of stress test. LVEF is preserved on echo performed yesterday.  Timing for the procedure will need to be discussed with the primary team, we don't want to it at the time of an acute infection, but don't want to wait too long considering the patient is symptomatic with on and off chest pain with minimal activities.  I would suggest to do cath on Thursday if ok with the primary team. Discussed with Dr Maryland Pink, she hasn't been improving as fast as expected, repeat abdominal X ray showed no obstruction and no perforation. She has had some blood in the stool, so this needs to be resolved if we are considering cath.  In case she is not improving we would consider a stress test to rule out  obstructive CAD prior to discharge.   I would start low dose aspirin 81 mg po daily, hold beta-blockers as she is bradycardic, and hold statins in the settings of acute diverticulitis. If evidence of CAD on cath, we will start statins.   Telemetry can be discontinued.  Signed, Dorothy Spark MD, [facility] 12/22/2014

## 2014-12-23 ENCOUNTER — Telehealth: Payer: Self-pay | Admitting: Physician Assistant

## 2014-12-23 DIAGNOSIS — R109 Unspecified abdominal pain: Secondary | ICD-10-CM

## 2014-12-23 DIAGNOSIS — E876 Hypokalemia: Secondary | ICD-10-CM | POA: Diagnosis not present

## 2014-12-23 DIAGNOSIS — I447 Left bundle-branch block, unspecified: Secondary | ICD-10-CM | POA: Diagnosis present

## 2014-12-23 DIAGNOSIS — K921 Melena: Secondary | ICD-10-CM

## 2014-12-23 MED ORDER — HYDROCODONE-ACETAMINOPHEN 5-325 MG PO TABS: 1.0000 | ORAL_TABLET | Freq: Four times a day (QID) | ORAL | Status: DC | PRN

## 2014-12-23 MED ORDER — METRONIDAZOLE 500 MG PO TABS: 500.0000 mg | ORAL_TABLET | Freq: Three times a day (TID) | ORAL | Status: DC

## 2014-12-23 MED ORDER — CIPROFLOXACIN HCL 500 MG PO TABS: 500.0000 mg | ORAL_TABLET | Freq: Two times a day (BID) | ORAL | Status: DC

## 2014-12-23 MED ORDER — CARVEDILOL 3.125 MG PO TABS: 3.1250 mg | ORAL_TABLET | Freq: Two times a day (BID) | ORAL | Status: DC

## 2014-12-23 NOTE — Progress Notes (Signed)
Patient Name: Cheryl Barnes Date of Encounter: 12/23/2014  Principal Problem:   Diverticulitis of large intestine without perforation or abscess without bleeding Active Problems:   Essential hypertension   Diverticulitis of colon   Diverticulitis   Pain in the chest   Abdominal pain   LBBB (left bundle branch block)   Hypokalemia   Hematochezia   Length of Stay: 5  SUBJECTIVE  She feels better today, had another retrosternal chest pain yesterday, continues to have blood in every bowel movement.   CURRENT MEDS   OBJECTIVE  Filed Vitals:   12/22/14 0530 12/22/14 1400 12/22/14 2120 12/23/14 0546  BP: 105/69 119/72 110/70 123/74  Pulse: 78 75 73 72  Temp: 98 F (36.7 C) 97.9 F (36.6 C) 98.3 F (36.8 C) 97.9 F (36.6 C)  TempSrc: Oral Oral Oral Oral  Resp: 16 18 18 16   Height:      Weight:      SpO2: 98% 100% 100% 95%    Intake/Output Summary (Last 24 hours) at 12/23/14 1435 Last data filed at 12/23/14 0931  Gross per 24 hour  Intake    480 ml  Output      0 ml  Net    480 ml   Filed Weights   12/19/14 0035  Weight: 155 lb 13.8 oz (70.7 kg)    PHYSICAL EXAM  General: Pleasant, NAD. Neuro: Alert and oriented X 3. Moves all extremities spontaneously. Psych: Normal affect. HEENT:  Normal  Neck: Supple without bruits or JVD. Lungs:  Resp regular and unlabored, CTA. Heart: RRR no s3, s4, or murmurs. Abdomen: Soft, non-tender, non-distended, BS + x 4.  Extremities: No clubbing, cyanosis or edema. DP/PT/Radials 2+ and equal bilaterally.  Accessory Clinical Findings  CBC  Recent Labs  12/21/14 0540 12/22/14 0545  WBC 4.2 4.2  HGB 13.3 13.7  HCT 40.1 40.9  MCV 90.7 90.1  PLT 159 937   Basic Metabolic Panel  Recent Labs  12/21/14 0540 12/22/14 0545  NA 141 141  K 3.6 3.4*  CL 108 107  CO2 25 24  GLUCOSE 92 97  BUN 6 9  CREATININE 0.72 0.74  CALCIUM 8.8 9.0   Cardiac Enzymes  Recent Labs  12/21/14 1040  TROPONINI <0.03     Recent Labs  12/22/14 0005  CHOL 131  HDL 37*  LDLCALC 75  TRIG 96  CHOLHDL 3.5    Radiology/Studies  Dg Abd 2 Views  12/21/2014   CLINICAL DATA:  Right upper and left lower quadrant pain. Nausea. Blood in stool. History of diverticulitis.  EXAM: ABDOMEN - 2 VIEW  COMPARISON:  CT 12/18/2014  FINDINGS: Air is present throughout the colon. There are a few scattered air-fluid levels predominately over the colon. No dilated small bowel loops. No free peritoneal air. Minimal degenerative change of the spine and hips.  IMPRESSION: Nonobstructive bowel gas pattern.   Electronically Signed   By: Marin Olp M.D.   On: 12/21/2014 14:42    TELE:     ASSESSMENT AND PLAN  64 year old female  1. Typical exertional chest pain - with findings of new LBBB on ECG, previous ECG from 2013 showed findings suspicious for anterior ischemia.  This patient also has significant family h/o premature CAD (in her father), she is considered a high risk. I would proceed directly with left cardiac catheterization instead of stress test. LVEF is preserved on echo performed yesterday.  Timing for the procedure was discussed with the primary  team, acute diverticulitis is resolving, however prolonged course with ongoing blood in the stool.  We will discharge with ASA 81 mg po daily, no BB as she is bradycardic, follow up in the clinic in 1 week, if resolved bleeding, we will schedule for a left sided cath.     Signed, Dorothy Spark MD, [facility] 12/23/2014

## 2014-12-23 NOTE — Telephone Encounter (Signed)
New message      TCM appt on 12-31-14 per [facility].

## 2014-12-23 NOTE — Discharge Summary (Signed)
HOSPITAL DISCHARGE SUMMARY  Cheryl Barnes  MRN: [id number]  DOB:August 12, 1951  Date of Admission: 12/18/2014 Date of Discharge: 12/23/2014         LOS: 5 days   Attending Physician:  Verlee Monte A  Patient's PCP:  Joycelyn Man, MD  Consults:   Cardiology  Discharge Diagnoses:  Diverticulitis of colon Essential hypertension LBBB Grade 1 diastolic dysfunction Hypokalemia Hematochezia  Present on Admission:  . Diverticulitis of colon . Essential hypertension . Diverticulitis . Pain in the chest . Abdominal pain . Diverticulitis of large intestine without perforation or abscess without bleeding . LBBB (left bundle branch block) . Hematochezia     Medication List    STOP taking these medications        amLODipine 5 MG tablet  Commonly known as:  NORVASC     ondansetron 8 MG disintegrating tablet  Commonly known as:  ZOFRAN-ODT     oxyCODONE-acetaminophen 5-325 MG per tablet  Commonly known as:  PERCOCET/ROXICET      TAKE these medications        carvedilol 3.125 MG tablet  Commonly known as:  COREG  Take 1 tablet (3.125 mg total) by mouth 2 (two) times daily with a meal.     ciprofloxacin 500 MG tablet  Commonly known as:  CIPRO  Take 1 tablet (500 mg total) by mouth 2 (two) times daily.     HYDROcodone-acetaminophen 5-325 MG per tablet  Commonly known as:  NORCO/VICODIN  Take 1-2 tablets by mouth every 6 (six) hours as needed for moderate pain.     MAGNESIUM PO  Take 1 tablet by mouth daily.     MELATONIN PO  Take 1 tablet by mouth at bedtime as needed (for pain).     metroNIDAZOLE 500 MG tablet  Commonly known as:  FLAGYL  Take 1 tablet (500 mg total) by mouth every 8 (eight) hours.     potassium chloride SA 20 MEQ tablet  Commonly known as:  K-DUR,KLOR-CON  TAKE 1 TABLET EVERY MORNING     ramipril 10 MG capsule  Commonly known as:  ALTACE  TAKE 1 CAPSULE DAILY     VITAMIN E PO  Take 1 tablet by mouth daily.         Brief  Admission History: Pt presented to the ED on 4/23 with 5 day hx of LLQ abdominal pain, nausea and vomiting. She has a significant hx for diverticulitis and that this feels the same as that. She also has had intermittent chest pain for the past few weeks that was beginning to come on at rest, episodes would only last a few seconds.   Hospital Course: Diverticulitis: Long standing hx of diverticulitis for pt. She has a GI doctor, Dr. Willa Rough, in [place] that she regularly sees. Had recently been prescribed cipro and flagyl for this and had finished the course 4 days prior to admission. CT scan of abdomen showed short segmental mid descending colonic wall thickening compatible with diverticulitis and no abscess. Pt was started on IV cipro and flagyl and a clear liquids diet. Pt's nausea and vomiting have improved since her admission, as has her abdominal pain. She is now able to tolerate a regular diet and po medication. Has an appointment in two weeks with Dr. Willa Rough already scheduled. Pt has now had two episodes of diverticulitis in the past 6 months and discussed with her that if her condition continues to worsen Dr. Willa Rough may consider surgery.  Discharged on oral Cipro and Flagyl  for 7 more days.  LBBB and Chest Pain: Intermittent hx of exertional chest pain prior to arrival for 1-2 weeks. Pt only had 1 episode of chest pain while admitted. She was at rest and states it only lasted 1-2 seconds. Significant family hx of mother having an MI at 41yo. EKG on arrival showed new LBBB, EKG in 2014 did not show this. Cardiology was consulted and serial troponins were negative. ECHO shows EF of 50% with grade 1 diastolic dysfunction. Cardiology recommends once Diverticulitis has cleared and pt has finished abx they would like to do a L cardiac cath to investigate cause of new LBBB. Cardiology has cleared pt for discharge.   Grade 1 Diastolic Dysfunction: No hx of this and see on echo. Cardiology is aware. See  above for plan.  Essential HTN: Pt's BPs have been stable during admission. Continue home medication.   Hypokalemia: At lowest 3.4, believe to be due to significant IV hydration during admission. Pt continued on home KCl during admission.   Hematochezia: New onset during admission. Hemoccult was negative. Pt states she first noticed bright red blood on her stool and when she wiped but it has since become darker and is not as prevalent. Abdominal xray negative and hemoglobin stable throughout admission. Will have her follow up with her GI doctor regarding this.   Present on Admission:  . Diverticulitis of colon . Essential hypertension . Diverticulitis . Pain in the chest . Abdominal pain . Diverticulitis of large intestine without perforation or abscess without bleeding . LBBB (left bundle branch block) . Hematochezia  Day of Discharge BP 123/74 mmHg  Pulse 72  Temp(Src) 97.9 F (36.6 C) (Oral)  Resp 16  Ht 5\' 1"  (1.549 m)  Wt 70.7 kg (155 lb 13.8 oz)  BMI 29.47 kg/m2  SpO2 95% Physical Exam:  General: Alert, and oriented not in any acute distress.  HEENT: anicteric sclera, pupils equal reactive to light and accommodation  CVS: S1-S2 heard, no murmur rubs or gallops  Chest: clear to auscultation bilaterally, no wheezing rales or rhonchi  Abdomen: normal bowel sounds, soft, nontender, nondistended, no organomegaly  Neuro: Cranial nerves II-XII intact, no focal neurological deficits  Extremities: no cyanosis, no clubbing or edema noted bilaterally  No results found for this or any previous visit (from the past 24 hour(s)).  Disposition: Discharge home with 7 days of po cipro and flagyl. F/u with GI and cardiology. Return to work on 5/2.   Follow-up Appts: Discharge Instructions    Diet - low sodium heart healthy    Complete by:  As directed      Increase activity slowly    Complete by:  As directed            Follow-up Information    Follow up with Worthy Keeler IV,  MD In 2 weeks.   Specialties:  Internal Medicine, Gastroenterology   Contact information:   [address] [place] [address] [phone]       I spent 40 minutes completing paperwork and coordinating discharge efforts.  Signed: Damaris Hippo, PA-S Verlee Monte, MD 12/23/2014, 2:24 PM    Addendum  Patient seen and examined, chart and data base reviewed.  I agree with the above assessment and plan.  For full details please see Mr. Damaris Hippo, PA-S note.  I reviewed and amended the above note as appropriate.   Birdie Hopes, MD [facility] Pager: [phone] 12/23/2014, 2:24 PM

## 2014-12-24 NOTE — Telephone Encounter (Signed)
Patient contacted regarding discharge from [facility] on 12/23/14.  Patient understands to follow up with provider Tarri Fuller on 12/31/14 at 10:30 at [facility].. Patient understands discharge instructions? yes Patient understands medications and regiment? yes Patient understands to bring all medications to this visit? yes  States she is feeling good.  Had a little dizziness and feeling tired this AM.  States she just got all of her BP meds and started them this AM.  States she isn't taking Coreg and states she did let Dr. Meda Coffee know that she wouldn't be taking until she got her other problems worked out.  States she is taking the Norvasc.  Reminded of app 5/6 with Gaspar Bidding.  States she is not sure she will keep appointment because when she returns to work she may not be able to get that day off.  She will call if can not keep appointment.

## 2014-12-31 ENCOUNTER — Other Ambulatory Visit: Payer: Self-pay | Admitting: Family Medicine

## 2014-12-31 ENCOUNTER — Encounter: Payer: BLUE CROSS/BLUE SHIELD | Admitting: Physician Assistant

## 2015-01-27 DIAGNOSIS — K5732 Diverticulitis of large intestine without perforation or abscess without bleeding: Secondary | ICD-10-CM | POA: Insufficient documentation

## 2015-02-08 ENCOUNTER — Other Ambulatory Visit: Payer: Self-pay | Admitting: General Surgery

## 2015-03-01 ENCOUNTER — Other Ambulatory Visit: Payer: Self-pay | Admitting: Family Medicine

## 2015-03-30 ENCOUNTER — Other Ambulatory Visit: Payer: Self-pay | Admitting: Family Medicine

## 2015-04-18 ENCOUNTER — Telehealth: Payer: Self-pay | Admitting: Family Medicine

## 2015-04-18 NOTE — Telephone Encounter (Signed)
Pt wanted to get refills. Then when we got to this point, she does not Remember what she needs/ will cb

## 2015-07-17 DIAGNOSIS — I10 Essential (primary) hypertension: Secondary | ICD-10-CM | POA: Insufficient documentation

## 2015-08-25 ENCOUNTER — Other Ambulatory Visit (INDEPENDENT_AMBULATORY_CARE_PROVIDER_SITE_OTHER): Payer: BLUE CROSS/BLUE SHIELD

## 2015-08-25 DIAGNOSIS — Z0000 Encounter for general adult medical examination without abnormal findings: Secondary | ICD-10-CM

## 2015-08-25 LAB — CBC WITH DIFFERENTIAL/PLATELET
BASOS ABS: 0 10*3/uL (ref 0.0–0.1)
Basophils Relative: 0.5 % (ref 0.0–3.0)
Eosinophils Absolute: 0 10*3/uL (ref 0.0–0.7)
Eosinophils Relative: 0.9 % (ref 0.0–5.0)
HEMATOCRIT: 40.9 % (ref 36.0–46.0)
Hemoglobin: 13.8 g/dL (ref 12.0–15.0)
LYMPHS PCT: 50.3 % — AB (ref 12.0–46.0)
Lymphs Abs: 2.2 10*3/uL (ref 0.7–4.0)
MCHC: 33.7 g/dL (ref 30.0–36.0)
MCV: 91.5 fl (ref 78.0–100.0)
Monocytes Absolute: 0.6 10*3/uL (ref 0.1–1.0)
Monocytes Relative: 14 % — ABNORMAL HIGH (ref 3.0–12.0)
NEUTROS PCT: 34.3 % — AB (ref 43.0–77.0)
Neutro Abs: 1.5 10*3/uL (ref 1.4–7.7)
PLATELETS: 168 10*3/uL (ref 150.0–400.0)
RBC: 4.47 Mil/uL (ref 3.87–5.11)
RDW: 12.8 % (ref 11.5–15.5)
WBC: 4.4 10*3/uL (ref 4.0–10.5)

## 2015-08-25 LAB — HEPATIC FUNCTION PANEL
ALK PHOS: 87 U/L (ref 39–117)
ALT: 16 U/L (ref 0–35)
AST: 17 U/L (ref 0–37)
Albumin: 4.4 g/dL (ref 3.5–5.2)
BILIRUBIN DIRECT: 0.1 mg/dL (ref 0.0–0.3)
BILIRUBIN TOTAL: 0.6 mg/dL (ref 0.2–1.2)
Total Protein: 7.3 g/dL (ref 6.0–8.3)

## 2015-08-25 LAB — POCT URINALYSIS DIPSTICK
BILIRUBIN UA: NEGATIVE
GLUCOSE UA: NEGATIVE
Ketones, UA: NEGATIVE
Nitrite, UA: NEGATIVE
Protein, UA: NEGATIVE
Urobilinogen, UA: 0.2
pH, UA: 5

## 2015-08-25 LAB — LIPID PANEL
CHOL/HDL RATIO: 4
Cholesterol: 215 mg/dL — ABNORMAL HIGH (ref 0–200)
HDL: 54.6 mg/dL (ref >39.00)
LDL CALC: 131 mg/dL — AB (ref 0–99)
NONHDL: 160.04
TRIGLYCERIDES: 146 mg/dL (ref 0.0–149.0)
VLDL: 29.2 mg/dL (ref 0.0–40.0)

## 2015-08-25 LAB — BASIC METABOLIC PANEL
BUN: 14 mg/dL (ref 6–23)
CALCIUM: 9.7 mg/dL (ref 8.4–10.5)
CO2: 26 meq/L (ref 19–32)
CREATININE: 0.79 mg/dL (ref 0.40–1.20)
Chloride: 111 mEq/L (ref 96–112)
GFR: 77.94 mL/min (ref >60.00)
Glucose, Bld: 91 mg/dL (ref 70–99)
Potassium: 4.4 mEq/L (ref 3.5–5.1)
Sodium: 147 mEq/L — ABNORMAL HIGH (ref 135–145)

## 2015-08-25 LAB — TSH: TSH: 1.33 u[IU]/mL (ref 0.35–4.50)

## 2015-08-30 ENCOUNTER — Encounter: Payer: BLUE CROSS/BLUE SHIELD | Admitting: Family Medicine

## 2015-09-14 ENCOUNTER — Ambulatory Visit (INDEPENDENT_AMBULATORY_CARE_PROVIDER_SITE_OTHER): Payer: BLUE CROSS/BLUE SHIELD | Admitting: Family Medicine

## 2015-09-14 ENCOUNTER — Ambulatory Visit (INDEPENDENT_AMBULATORY_CARE_PROVIDER_SITE_OTHER)
Admission: RE | Admit: 2015-09-14 | Discharge: 2015-09-14 | Disposition: A | Discharge status: Home / Self Care | Payer: BLUE CROSS/BLUE SHIELD | Source: Ambulatory Visit | Attending: Family Medicine | Admitting: Family Medicine

## 2015-09-14 ENCOUNTER — Encounter: Payer: Self-pay | Admitting: Family Medicine

## 2015-09-14 VITALS — BP 124/84 | Temp 98.2°F | Ht 62.0 in | Wt 162.0 lb

## 2015-09-14 DIAGNOSIS — Z0000 Encounter for general adult medical examination without abnormal findings: Secondary | ICD-10-CM | POA: Insufficient documentation

## 2015-09-14 DIAGNOSIS — N952 Postmenopausal atrophic vaginitis: Secondary | ICD-10-CM | POA: Diagnosis not present

## 2015-09-14 DIAGNOSIS — M25552 Pain in left hip: Secondary | ICD-10-CM | POA: Diagnosis not present

## 2015-09-14 DIAGNOSIS — I10 Essential (primary) hypertension: Secondary | ICD-10-CM | POA: Diagnosis not present

## 2015-09-14 DIAGNOSIS — M25551 Pain in right hip: Secondary | ICD-10-CM | POA: Diagnosis not present

## 2015-09-14 MED ORDER — CARVEDILOL 3.125 MG PO TABS: 3.1250 mg | ORAL_TABLET | Freq: Two times a day (BID) | ORAL | Status: DC

## 2015-09-14 MED ORDER — AMLODIPINE BESYLATE 5 MG PO TABS: ORAL_TABLET | ORAL | Status: DC

## 2015-09-14 MED ORDER — ESTROGENS, CONJUGATED 0.625 MG/GM VA CREA: 1.0000 | TOPICAL_CREAM | Freq: Every day | VAGINAL | Status: DC

## 2015-09-14 MED ORDER — VALACYCLOVIR HCL 1 G PO TABS: ORAL_TABLET | ORAL | Status: DC

## 2015-09-14 MED ORDER — POTASSIUM CHLORIDE CRYS ER 20 MEQ PO TBCR
EXTENDED_RELEASE_TABLET | ORAL | Status: DC
Start: 2015-09-14 — End: 2016-03-20

## 2015-09-14 NOTE — Progress Notes (Signed)
Subjective:    Patient ID: Cheryl Barnes, female    DOB: Feb 18, 1951, 65 y.o.   MRN: [id number]  HPI Cheryl Barnes is a 65 year old married female nonsmoker retired Marine scientist from [employer] who comes in today for general physical examination because of some medical problems  She has a history of underlying hypertension. She takes Norvasc 5 mg daily and Coreg 3.125 mg twice a day. 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 She was to with IV antibiotics for cover with no sequelae. During her hospitalization she was noted to have a left bundle branch block. Cardiac evaluation including exercise test were normal. The Altace was discontinued and she was started on a beta blocker.  She's also complaining of bilateral hip pain for about a year left forced in the right. No history of trauma that she can recall  She gets routine eye care, dental care, BSE monthly, mammography 2015. 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   HENT: Negative.   Eyes: Negative.   Respiratory: Negative.   Cardiovascular: Negative.   Gastrointestinal: Negative.   Endocrine: Negative.   Genitourinary: Negative.   Musculoskeletal: Negative.   Skin: Negative.   Allergic/Immunologic: Negative.   Neurological: Negative.   Hematological: Negative.   Psychiatric/Behavioral: Negative.        Objective:   Physical Exam  Constitutional: She appears well-developed and well-nourished.  HENT:  Head: Normocephalic and atraumatic.  Right Ear: External ear normal.  Left Ear: External ear normal.  Nose: Nose normal.  Mouth/Throat: Oropharynx is clear and moist.  Eyes: EOM are normal.  Pupils are equal, round, and reactive to light.  Neck: Normal range of motion. Neck supple. No JVD present. No tracheal deviation present. No thyromegaly present.  Cardiovascular: Normal rate, regular rhythm, normal heart sounds and intact distal pulses.  Exam reveals no gallop and no friction rub.   No murmur heard. No carotid neurologic bruits peripheral pulses 2+ and symmetrical  Pulmonary/Chest: Effort normal and breath sounds normal. No stridor. No respiratory distress. She has no wheezes. She has no rales. She exhibits no tenderness.  Abdominal: Soft. Bowel sounds are normal. She exhibits no distension and no mass. There is no tenderness. There is no rebound and no guarding.  Genitourinary:  Bilateral breast exam normal  Musculoskeletal: Normal range of motion.  Lymphadenopathy:    She has no cervical adenopathy.  Neurological: She is alert. She has normal reflexes. No cranial nerve deficit. She exhibits normal muscle tone. Coordination normal.  Skin: Skin is warm and dry. No rash noted. No erythema. No pallor.  Total body skin exam normal numerous seborrheic keratosis  Psychiatric: She has a normal mood and affect. Her behavior is normal. Judgment and thought content normal.  Nursing note and vitals reviewed.  The right hip is lacking 61 of external rotation left hip I can only external rotate about 15       Assessment & Plan:  Hypertension at goal............ continue current therapy  New problem of bilateral hip pain left worse in the right........... x-ray hips physical therapy.........Marland Kitchen Motrin 400 twice a day  History of low potassium ........ potassium 20 mEq  daily  Recent hospitalization for diverticulitis .....Marland Kitchen currently asymptomatic  Status post TAH and one ovary removed in her mid 64s for dysfunction uterine bleeding no cancer ////// therefore pelvics and Paps not indicated .....Marland KitchenMarland Kitchen

## 2015-09-14 NOTE — Patient Instructions (Signed)
Go to the main office now for x-rays of your hips  Motrin 400 mg twice daily with food  We'll begin physical therapy  If the above does not help or your pain gets worse then I would recommend a consult with Dr. Pilar Plate a or Dr. Catalina Antigua o  Continue other medications  Follow-up in one year sooner if any problem  Hormonal cream............. Cosco...........Marland Kitchen or [email or web address]

## 2015-09-14 NOTE — Progress Notes (Signed)
Pre visit review using our clinic review tool, if applicable. No additional management support is needed unless otherwise documented below in the visit note. 

## 2015-09-20 ENCOUNTER — Ambulatory Visit: Payer: BLUE CROSS/BLUE SHIELD | Attending: Family Medicine | Admitting: Physical Therapy

## 2015-09-20 DIAGNOSIS — R29898 Other symptoms and signs involving the musculoskeletal system: Secondary | ICD-10-CM

## 2015-09-20 DIAGNOSIS — M25551 Pain in right hip: Secondary | ICD-10-CM | POA: Diagnosis present

## 2015-09-20 DIAGNOSIS — M25552 Pain in left hip: Secondary | ICD-10-CM | POA: Diagnosis present

## 2015-09-20 DIAGNOSIS — M25652 Stiffness of left hip, not elsewhere classified: Secondary | ICD-10-CM | POA: Diagnosis present

## 2015-09-20 DIAGNOSIS — M25651 Stiffness of right hip, not elsewhere classified: Secondary | ICD-10-CM | POA: Insufficient documentation

## 2015-09-20 DIAGNOSIS — R262 Difficulty in walking, not elsewhere classified: Secondary | ICD-10-CM | POA: Diagnosis present

## 2015-09-20 NOTE — Therapy (Signed)
[facility] [address], [place], [place], [address] Phone: [phone]   Fax:  [phone]  Physical Therapy Evaluation  Patient Details  Name: Cheryl Barnes MRN: [id number] Date of Birth: 1951-05-12 Referring Provider: Dr. Sherren Mocha  Encounter Date: 09/20/2015      PT End of Session - 09/20/15 1946    Visit Number 1   Number of Visits 16   Date for PT Re-Evaluation 11/15/15   Authorization Type BCBS   PT Start Time [id number]   PT Stop Time 1230   PT Time Calculation (min) 45 min   Activity Tolerance Patient tolerated treatment well      Past Medical History  Diagnosis Date  . Hypertension   . Migraine     history  . Allergy   . Diverticulitis     Past Surgical History  Procedure Laterality Date  . Cesarean section    . Fracture surgery    . Partial hysterectomy      Abdominal  . Cholecystectomy    . Rotator cuff repair Left     There were no vitals filed for this visit.  Visit Diagnosis:  Bilateral hip pain - Plan: PT plan of care cert/re-cert  Hip stiffness, left - Plan: PT plan of care cert/re-cert  Hip stiffness, right - Plan: PT plan of care cert/re-cert  Weakness of both lower extremities - Plan: PT plan of care cert/re-cert  Difficulty in walking - Plan: PT plan of care cert/re-cert      Subjective Assessment - 09/20/15 1150    Subjective Bilataral left worse than right.  Can't stand on one leg to put on pants.  Worse in the AM.  Better with movement.  3 month history.  No trauma.   Worse standing on concrete floor.     Limitations Standing;House hold activities   How long can you sit comfortably? 20 min    How long can you walk comfortably? 5-10 min   Diagnostic tests x-ray OA/degeneration   Patient Stated Goals be able to dress without hurting,  wake up without pain, walk and sit longer   Currently in Pain? Yes   Pain Score 2    Pain Location Hip   Pain Orientation Right;Left;Lateral    Pain Descriptors / Indicators Aching   Pain Type Chronic pain   Pain Onset More than a month ago   Pain Frequency Intermittent   Aggravating Factors  standing on one leg to dress, walking, standing, sitting    Pain Relieving Factors ibuprofen, warmth, cold            [facility] PT Assessment - 09/20/15 1156    Assessment   Medical Diagnosis B hip pain   Referring Provider Dr. Sherren Mocha   Onset Date/Surgical Date --  3 months   Hand Dominance Right   Next MD Visit not scheduled   Prior Therapy PT for RTC    Precautions   Precautions None   Restrictions   Weight Bearing Restrictions No   Balance Screen   Has the patient fallen in the past 6 months Yes   How many times? 1  hanging art on 3 step ladder, bruises on legs   Has the patient had a decrease in activity level because of a fear of falling?  No   Is the patient reluctant to leave their home because of a fear of falling?  No   Home Environment   Living Environment Private residence   Type of  Home House   Home Access Stairs to enter   Entrance Stairs-Number of Steps [place] One level   Prior Function   Level of Independence Independent   Vocation Retired   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   Observation/Other Assessments   Focus on Therapeutic Outcomes (FOTO)  56% limitation   Posture/Postural Control   Posture Comments decreased lumbar lordosis   ROM / Strength   AROM / PROM / Strength AROM;Strength   AROM   AROM Assessment Site Hip;Lumbar   Right/Left Hip Right;Left   Right Hip Extension 5   Right Hip Flexion 90   Right Hip External Rotation  30   Right Hip Internal Rotation  10   Left Hip Extension 0   Left Hip Flexion 85   Left Hip External Rotation  10  painful   Left Hip Internal Rotation  5  painful   Lumbar Flexion WFLs   Lumbar Extension 5   Lumbar - Right Side Bend WFLs    Lumbar - Left Side Bend WFLs   Strength   Overall Strength Comments Able to rise from standard chair without UE use   Strength Assessment Site Hip;Lumbar   Right/Left Hip Right;Left   Right Hip Flexion 4+/5   Right Hip Extension 4-/5   Right Hip External Rotation  4/5   Right Hip ABduction 4-/5   Left Hip Flexion 4+/5   Left Hip Extension 4-/5   Left Hip External Rotation 4-/5   Left Hip ABduction 4-/5   Flexibility   Soft Tissue Assessment /Muscle Length yes   Hamstrings 50 degrees B   Quadriceps left > right  decreased hip flexor lengths   Palpation   Palpation comment tenderess left trochanteric bursa, gluteals and piriformis                           PT Education - 09/20/15 1945    Education provided Yes   Education Details use of heat in the morning;  standing hip swings for pain control   Person(s) Educated Patient   Methods Explanation;Handout   Comprehension Verbalized understanding          PT Short Term Goals - 09/20/15 2001    PT SHORT TERM GOAL #1   Title Patient will express understanding of basic self care (use of heat,ice)  and ROM   10/18/15   Time 4   Period Weeks   Status New   PT SHORT TERM GOAL #2   Title Improved hip flexion bilaterally to 95 degrees for greater ease going up and down steps and putting on pants   Time 4   Period Weeks   Status New   PT SHORT TERM GOAL #3   Title Patient will be able to single leg balance 8 sec on R/L needed for putting on/taking of pants   Time 4   Period Weeks   Status New   PT SHORT TERM GOAL #4   Title The patient will report a 25% improvement in pain with sitting  and walking   Time 4   Period Weeks           PT Long Term Goals - 09/20/15 2005    PT LONG TERM GOAL #1   Title The patient will be independent in HEP for further improvements  in ROM, strength and function  11/15/15   Time 8   Period Weeks   Status New   PT LONG TERM GOAL #2   Title The patient will have improved  bilateral HS length to 60 degrees and hip flexor lengths to 10 degees needed for walking longer periods of time.   Time 8   Period Weeks   Status New   PT LONG TERM GOAL #3   Title Improved bilateral hip extension and hip abd strength improved to grossly 4/5 needed for ascending and descending steps with greater ease   Time 8   Period Weeks   Status New   PT LONG TERM GOAL #4   Title The patient will report an overall improvement in sitting and walking tolerance by 50%   Time 8   Period Weeks   Status New   PT LONG TERM GOAL #5   Title FOTO functional outcome score improved from 56% to 42% indicating improved function with less pain   Time 8   Period Weeks   Status New               Plan - 09/20/15 1947    Clinical Impression Statement The patient presents for low complexity evaluation with complaints of Bilateral lateral hip pain (left worse than right.).  The patient complains of AM pain and stiffness which improves as the day goes on.  The pain is also aggravated with standing on one leg to put her pants on, walking > 5-10 min especially on a concrete floor, sitting too long and sometimes going up and down stairs.  Decreased bilateral HS lengths, hip flexor lengths.  Decreased hip flexion bilterally 85 on left, 90 on right.  Decreased hip IR and ER B and painful on left.  Painful left > right hip scour.  Decreased  hip extension and hip abduction strength 4-/5.  She would benefit from PT to address these deficits.     Pt will benefit from skilled therapeutic intervention in order to improve on the following deficits Decreased range of motion;Decreased endurance;Decreased strength;Hypomobility;Increased muscle spasms;Pain;Difficulty walking   Rehab Potential Good   PT Frequency 2x / week   PT Duration 8 weeks   PT Treatment/Interventions ADLs/Self Care Home Management;Cryotherapy;Electrical Stimulation;Moist Heat;Ultrasound;Iontophoresis 4mg /ml Dexamethasone;Therapeutic  exercise;Patient/family education;Manual techniques;Dry needling;Taping   PT Next Visit Plan ionto patch if cert signed;  possible dry needling left gluteals, piriformis, gentle hip mobs;  hip ROM, stretch HS, hip flexors; modalities for pain control   PT Home Exercise Plan standing leg swing         Problem List Patient Active Problem List   Diagnosis Date Noted  . Bilateral hip pain 09/14/2015  . Routine general medical examination at a health care facility 09/14/2015  . LBBB (left bundle branch block) 12/23/2014  . Hypokalemia 12/23/2014  . Hematochezia 12/23/2014  . Abdominal pain   . Diverticulitis of large intestine without perforation or abscess without bleeding   . Pain in the chest   . Diverticulitis 12/18/2014  . Solitary pulmonary nodule 06/09/2014  . Left lower lobe pneumonia 10/06/2013  . Urinary incontinence in female 03/02/2013  . Postmenopause atrophic vaginitis 03/02/2013  . RUQ PAIN 04/01/2007  . Essential hypertension 03/24/2007  . Diverticulitis of colon 03/17/2007    [name] 09/20/2015, 8:12 PM  [facility] [address] [place], [place], [address] Phone: [phone]   Fax:  [phone]  Name: Cheryl Barnes MRN: [id number]  Date of Birth: May 17, 1951   Ruben Im, PT 09/20/2015 8:12 PM Phone: [phone] Fax: [phone]

## 2015-09-22 ENCOUNTER — Ambulatory Visit: Payer: BLUE CROSS/BLUE SHIELD | Admitting: Physical Therapy

## 2015-09-22 DIAGNOSIS — M25551 Pain in right hip: Secondary | ICD-10-CM

## 2015-09-22 DIAGNOSIS — M25651 Stiffness of right hip, not elsewhere classified: Secondary | ICD-10-CM

## 2015-09-22 DIAGNOSIS — M25652 Stiffness of left hip, not elsewhere classified: Secondary | ICD-10-CM

## 2015-09-22 DIAGNOSIS — M25552 Pain in left hip: Principal | ICD-10-CM

## 2015-09-22 DIAGNOSIS — R29898 Other symptoms and signs involving the musculoskeletal system: Secondary | ICD-10-CM

## 2015-09-22 DIAGNOSIS — R262 Difficulty in walking, not elsewhere classified: Secondary | ICD-10-CM

## 2015-09-22 NOTE — Patient Instructions (Addendum)
Abduction: Clam (Eccentric) - Side-Lying   Lie on side with knees bent. Lift top knee, keeping feet together. Keep trunk steady. Slowly lower for 3-5 seconds. _15__ reps per set, __1_ sets per day, __7_ days per week.   Copyright  VHI. All rights reserved.    IONTOPHORESIS PATIENT PRECAUTIONS & CONTRAINDICATIONS:  . Redness under one or both electrodes can occur.  This characterized by a uniform redness that usually disappears within 12 hours of treatment. . Small pinhead size blisters may result in response to the drug.  Contact your physician if the problem persists more than 24 hours. . On rare occasions, iontophoresis therapy can result in temporary skin reactions such as rash, inflammation, irritation or burns.  The skin reactions may be the result of individual sensitivity to the ionic solution used, the condition of the skin at the start of treatment, reaction to the materials in the electrodes, allergies or sensitivity to dexamethasone, or a poor connection between the patch and your skin.  Discontinue using iontophoresis if you have any of these reactions and report to your therapist. . Remove the Patch or electrodes if you have any undue sensation of pain or burning during the treatment and report discomfort to your therapist. . Tell your Therapist if you have had known adverse reactions to the application of electrical current. . If using the Patch, the LED light will turn off when treatment is complete and the patch can be removed.  Approximate treatment time is 1-3 hours.  Remove the patch when light goes off or after 6 hours. . The Patch can be worn during normal activity, however excessive motion where the electrodes have been placed can cause poor contact between the skin and the electrode or uneven electrical current resulting in greater risk of skin irritation. Marland Kitchen Keep out of the reach of children.   . DO NOT use if you have a cardiac pacemaker or any other electrically  sensitive implanted device. . DO NOT use if you have a known sensitivity to dexamethasone. . DO NOT use during Magnetic Resonance Imaging (MRI). . DO NOT use over broken or compromised skin (e.g. sunburn, cuts, or acne) due to the increased risk of skin reaction. . DO NOT SHAVE over the area to be treated:  To establish good contact between the Patch and the skin, excessive hair may be clipped. . DO NOT place the Patch or electrodes on or over your eyes, directly over your heart, or brain. . DO NOT reuse the Patch or electrodes as this may cause burns to occur.  [facility] [address], [place], [place] [address] Phone # [phone] Fax [phone]

## 2015-09-22 NOTE — Therapy (Signed)
[facility] [address], [place], [place], [address] Phone: [phone]   Fax:  [phone]  Physical Therapy Treatment  Patient Details  Name: Cheryl Barnes MRN: [id number] Date of Birth: 29-Aug-1950 Referring Provider: Dr. Sherren Mocha  Encounter Date: 09/22/2015      PT End of Session - 09/22/15 0917    Visit Number 2   Number of Visits 16   Date for PT Re-Evaluation 11/15/15   Authorization Type BCBS   PT Start Time 0845   PT Stop Time 0925   PT Time Calculation (min) 40 min   Activity Tolerance Patient tolerated treatment well      Past Medical History  Diagnosis Date  . Hypertension   . Migraine     history  . Allergy   . Diverticulitis     Past Surgical History  Procedure Laterality Date  . Cesarean section    . Fracture surgery    . Partial hysterectomy      Abdominal  . Cholecystectomy    . Rotator cuff repair Left     There were no vitals filed for this visit.  Visit Diagnosis:  Bilateral hip pain  Hip stiffness, left  Hip stiffness, right  Weakness of both lower extremities  Difficulty in walking      Subjective Assessment - 09/22/15 0847    Subjective Denies soreness from evaluation.  No pain at present.  Been doing leg swings.    Currently in Pain? No/denies   Pain Score 0-No pain   Pain Orientation Right;Left   Pain Type Chronic pain   Pain Onset More than a month ago   Aggravating Factors  going through cabinets and drawers;                           [facility] Adult PT Treatment/Exercise - 09/22/15 0001    Knee/Hip Exercises: Stretches   Hip Flexor Stretch Right;Left;3 reps;30 seconds   Knee/Hip Exercises: Aerobic   Nustep L1 6 min   Knee/Hip Exercises: Supine   Bridges Strengthening;Both;1 set;15 reps  over red ball   Other Supine Knee/Hip Exercises hip/knee flexion with LEs on ball 20x   Knee/Hip Exercises: Sidelying   Clams 10x B    Modalities   Modalities Iontophoresis   Iontophoresis   Type of Iontophoresis Dexamethasone   Location left hip    Dose 1cc 4mg /ml   Time patch 4-6 hours   Manual Therapy   Manual Therapy Joint mobilization;Passive ROM   Joint Mobilization B long axis distraction; inferior hip mobs grade 3 30s each   Passive ROM B HS stretch 1x 30 sec                PT Education - 09/22/15 0918    Education provided Yes   Education Details clams, stand hip flexor stretch and ionto instructions   Person(s) Educated Patient   Methods Explanation;Demonstration;Handout   Comprehension Verbalized understanding;Returned demonstration          PT Short Term Goals - 09/22/15 0927    PT SHORT TERM GOAL #1   Title Patient will express understanding of basic self care (use of heat,ice)  and ROM   10/18/15   Period Weeks   Status Achieved   PT SHORT TERM GOAL #2   Title Improved hip flexion bilaterally to 95 degrees for greater ease going up and down steps and putting on pants   Time 4  Period Weeks   Status On-going   PT SHORT TERM GOAL #3   Title Patient will be able to single leg balance 8 sec on R/L needed for putting on/taking of pants   Time 4   Period Weeks   Status On-going   PT SHORT TERM GOAL #4   Title The patient will report a 25% improvement in pain with sitting  and walking   Time 4   Period Weeks   Status On-going           PT Long Term Goals - 09/22/15 [id number]    PT LONG TERM GOAL #1   Title The patient will be independent in HEP for further improvements in ROM, strength and function  11/15/15   Time 8   Period Weeks   Status On-going   PT LONG TERM GOAL #2   Title The patient will have improved bilateral HS length to 60 degrees and hip flexor lengths to 10 degees needed for walking longer periods of time.   Time 8   Period Weeks   Status On-going   PT LONG TERM GOAL #3   Title Improved bilateral hip extension and hip abd strength improved to grossly 4/5 needed for ascending  and descending steps with greater ease   Time 8   Period Weeks   Status On-going   PT LONG TERM GOAL #4   Title The patient will report an overall improvement in sitting and walking tolerance by 50%   Time 8   Period Weeks   Status On-going   PT LONG TERM GOAL #5   Title FOTO functional outcome score improved from 56% to 42% indicating improved function with less pain   Time 8   Period Weeks   Status On-going               Plan - 09/22/15 [id number]    Clinical Impression Statement The patient denies pain in either hip today.  Has mild left hip catching with distraction of the hip.  Responded well to low level ROM and strengthening B hips.  Therapist closely monitoring pain and cueing for proper technique.  Patient would like to try other interventions before dry needling.     PT Next Visit Plan assess response to ionto patch #1; gentle hip mobs and soft tissue B;  hip AROM and low level strengthening, continue Nu-Step        Problem List Patient Active Problem List   Diagnosis Date Noted  . Bilateral hip pain 09/14/2015  . Routine general medical examination at a health care facility 09/14/2015  . LBBB (left bundle branch block) 12/23/2014  . Hypokalemia 12/23/2014  . Hematochezia 12/23/2014  . Abdominal pain   . Diverticulitis of large intestine without perforation or abscess without bleeding   . Pain in the chest   . Diverticulitis 12/18/2014  . Solitary pulmonary nodule 06/09/2014  . Left lower lobe pneumonia 10/06/2013  . Urinary incontinence in female 03/02/2013  . Postmenopause atrophic vaginitis 03/02/2013  . RUQ PAIN 04/01/2007  . Essential hypertension 03/24/2007  . Diverticulitis of colon 03/17/2007    [name] 09/22/2015, 9:29 AM  [facility] [address], [place], [place], [address] Phone: [phone]   Fax:  [phone]  Name: Cheryl Barnes MRN: [id number] Date of Birth:  [birth year]    Ruben Im, PT 09/22/2015 9:30 AM Phone: [phone] Fax: [phone]

## 2015-09-27 ENCOUNTER — Ambulatory Visit: Payer: BLUE CROSS/BLUE SHIELD | Admitting: Physical Therapy

## 2015-09-27 DIAGNOSIS — M25551 Pain in right hip: Secondary | ICD-10-CM

## 2015-09-27 DIAGNOSIS — R262 Difficulty in walking, not elsewhere classified: Secondary | ICD-10-CM

## 2015-09-27 DIAGNOSIS — M25552 Pain in left hip: Principal | ICD-10-CM

## 2015-09-27 DIAGNOSIS — R29898 Other symptoms and signs involving the musculoskeletal system: Secondary | ICD-10-CM

## 2015-09-27 DIAGNOSIS — M25652 Stiffness of left hip, not elsewhere classified: Secondary | ICD-10-CM

## 2015-09-27 DIAGNOSIS — M25651 Stiffness of right hip, not elsewhere classified: Secondary | ICD-10-CM

## 2015-09-27 NOTE — Therapy (Signed)
[facility] [address] [place], [place], [address] Phone: [phone]   Fax:  [phone]  Physical Therapy Treatment  Patient Details  Name: Cheryl Barnes MRN: [id number] Date of Birth: Nov 20, 1950 Referring Provider: Dr. Sherren Mocha  Encounter Date: 09/27/2015      PT End of Session - 09/27/15 1214    Visit Number 3   Number of Visits 16   Date for PT Re-Evaluation 11/15/15   Authorization Type BCBS   PT Start Time [id number]   PT Stop Time 1230   PT Time Calculation (min) 45 min   Activity Tolerance Patient tolerated treatment well      Past Medical History  Diagnosis Date  . Hypertension   . Migraine     history  . Allergy   . Diverticulitis     Past Surgical History  Procedure Laterality Date  . Cesarean section    . Fracture surgery    . Partial hysterectomy      Abdominal  . Cholecystectomy    . Rotator cuff repair Left     There were no vitals filed for this visit.  Visit Diagnosis:  Bilateral hip pain  Hip stiffness, left  Hip stiffness, right  Weakness of both lower extremities  Difficulty in walking      Subjective Assessment - 09/27/15 1148    Subjective I think the ionto patch helped but the last couple days I have been in a lot of pain.  Pain over left trochanteric bursa.   Patient states she thinks the cold weather aggravates as well as rolling onto her left side.  Morning is worse.  I do the exercises later in the afternoon.  The exercises are helping.     Currently in Pain? Yes   Pain Score 8    Pain Location Hip   Pain Orientation Left   Pain Type Chronic pain   Pain Onset More than a month ago   Pain Frequency Intermittent                         [facility] Adult PT Treatment/Exercise - 09/27/15 0001    Lumbar Exercises: Supine   Ab Set 10 reps   Isometric Hip Flexion 5 reps   Knee/Hip Exercises: Aerobic   Nustep L1 6 min   Knee/Hip Exercises: Seated   Long  Arc Quad Strengthening;Right;Left;1 set;15 reps   Long Arc Quad Weight --  red band    Marching Strengthening;Right;Left;1 set;15 reps   Sit to [employer] 10 reps;without UE support   Knee/Hip Exercises: Supine   Bridges Strengthening;Both;1 set;15 reps  over red ball   Other Supine Knee/Hip Exercises hip/knee flexion with LEs on ball 20x   Knee/Hip Exercises: Sidelying   Clams 15x R/L   Iontophoresis   Type of Iontophoresis Dexamethasone   Location left hip    Dose 1cc 4mg /ml   Time patch 4-6 hours                  PT Short Term Goals - 09/27/15 1222    PT SHORT TERM GOAL #1   Title Patient will express understanding of basic self care (use of heat,ice)  and ROM   10/18/15   Status Achieved   PT SHORT TERM GOAL #2   Title Improved hip flexion bilaterally to 95 degrees for greater ease going up and down steps and putting on pants   Time 4   Period Weeks  Status On-going   PT SHORT TERM GOAL #3   Title Patient will be able to single leg balance 8 sec on R/L needed for putting on/taking of pants   Time 4   Period Weeks   Status On-going   PT SHORT TERM GOAL #4   Title The patient will report a 25% improvement in pain with sitting  and walking   Time 4   Period Weeks   Status On-going           PT Long Term Goals - 09/27/15 2021    PT LONG TERM GOAL #1   Title The patient will be independent in HEP for further improvements in ROM, strength and function  11/15/15   Time 8   Period Weeks   Status On-going   PT LONG TERM GOAL #2   Title The patient will have improved bilateral HS length to 60 degrees and hip flexor lengths to 10 degees needed for walking longer periods of time.   Time 8   Period Weeks   Status On-going   PT LONG TERM GOAL #3   Title Improved bilateral hip extension and hip abd strength improved to grossly 4/5 needed for ascending and descending steps with greater ease   Time 8   Period Weeks   Status On-going   PT LONG TERM GOAL #4   Title  The patient will report an overall improvement in sitting and walking tolerance by 50%   Time 8   Period Weeks   Status On-going   PT LONG TERM GOAL #5   Title FOTO functional outcome score improved from 56% to 42% indicating improved function with less pain   Time 8   Period Weeks   Status On-going               Plan - 09/27/15 1215    Clinical Impression Statement Muscular fatigue with low level hip ROM and strengthening exercises.  No complaint of pain during exercises but point tender over left trochanteric bursa.  Therapist closely monitoring response and modifying as needed.     PT Next Visit Plan continue ionto trial #2 today, Nu-step;  try hip isometrics and/or band hip;  add band to clams        Problem List Patient Active Problem List   Diagnosis Date Noted  . Bilateral hip pain 09/14/2015  . Routine general medical examination at a health care facility 09/14/2015  . LBBB (left bundle branch block) 12/23/2014  . Hypokalemia 12/23/2014  . Hematochezia 12/23/2014  . Abdominal pain   . Diverticulitis of large intestine without perforation or abscess without bleeding   . Pain in the chest   . Diverticulitis 12/18/2014  . Solitary pulmonary nodule 06/09/2014  . Left lower lobe pneumonia 10/06/2013  . Urinary incontinence in female 03/02/2013  . Postmenopause atrophic vaginitis 03/02/2013  . RUQ PAIN 04/01/2007  . Essential hypertension 03/24/2007  . Diverticulitis of colon 03/17/2007    [name] 09/27/2015, 8:22 PM  [facility] [address] [place], [place], [address] Phone: [phone]   Fax:  [phone]  Name: RHODES BORUNDA MRN: [id number] Date of Birth: February 23, 1951    Ruben Im, PT 09/27/2015 8:22 PM Phone: [phone] Fax: [phone]

## 2015-09-30 ENCOUNTER — Ambulatory Visit: Payer: BLUE CROSS/BLUE SHIELD | Attending: Family Medicine | Admitting: Physical Therapy

## 2015-09-30 DIAGNOSIS — R29898 Other symptoms and signs involving the musculoskeletal system: Secondary | ICD-10-CM | POA: Insufficient documentation

## 2015-09-30 DIAGNOSIS — R262 Difficulty in walking, not elsewhere classified: Secondary | ICD-10-CM | POA: Diagnosis present

## 2015-09-30 DIAGNOSIS — M25652 Stiffness of left hip, not elsewhere classified: Secondary | ICD-10-CM | POA: Diagnosis present

## 2015-09-30 DIAGNOSIS — M25551 Pain in right hip: Secondary | ICD-10-CM | POA: Diagnosis present

## 2015-09-30 DIAGNOSIS — M25651 Stiffness of right hip, not elsewhere classified: Secondary | ICD-10-CM | POA: Insufficient documentation

## 2015-09-30 DIAGNOSIS — M25552 Pain in left hip: Secondary | ICD-10-CM | POA: Insufficient documentation

## 2015-09-30 NOTE — Therapy (Signed)
[facility] [address], [place], [place], [address] Phone: [phone]   Fax:  [phone]  Physical Therapy Treatment  Patient Details  Name: Cheryl Barnes MRN: [id number] Date of Birth: 1951/07/07 Referring Provider: Dr. Sherren Mocha  Encounter Date: 09/30/2015      PT End of Session - 09/30/15 1031    Visit Number 3   Number of Visits 16   Date for PT Re-Evaluation 11/15/15   Authorization Type BCBS   PT Start Time 1015   PT Stop Time 1100   PT Time Calculation (min) 45 min   Activity Tolerance Patient tolerated treatment well      Past Medical History  Diagnosis Date  . Hypertension   . Migraine     history  . Allergy   . Diverticulitis     Past Surgical History  Procedure Laterality Date  . Cesarean section    . Fracture surgery    . Partial hysterectomy      Abdominal  . Cholecystectomy    . Rotator cuff repair Left     There were no vitals filed for this visit.  Visit Diagnosis:  Bilateral hip pain  Hip stiffness, left  Hip stiffness, right  Weakness of both lower extremities  Difficulty in walking      Subjective Assessment - 09/30/15 1013    Subjective I don't think the patch didn't help as much.  3/10 pain at present.  No excessive soreness after last visit.     Currently in Pain? Yes   Pain Score 3    Pain Location Hip   Pain Orientation Left   Pain Type Chronic pain   Aggravating Factors  AM dressing, stooping down to reach lower cabinets            [facility] PT Assessment - 09/30/15 0001    AROM   Left Hip Flexion 85   Balance   Balance Assessed --  Left SLS 10 sec                     [facility] Adult PT Treatment/Exercise - 09/30/15 0001    Lumbar Exercises: Supine   Ab Set 10 reps   Bridge 10 reps   Bridge Limitations with red ball   Isometric Hip Flexion 5 reps   Other Supine Lumbar Exercises red ball hip/knee flexion and lumbar rotation 15x each   Knee/Hip Exercises: Aerobic   Nustep L2 5 min   Knee/Hip Exercises: Standing   Other Standing Knee Exercises hip isometric hip abd and extension 5 sec 5 sec holds   Knee/Hip Exercises: Sidelying   Clams 10x red band   Iontophoresis   Type of Iontophoresis Dexamethasone   Location left hip    Dose 1cc 4mg /ml   Time patch 4-6 hours                  PT Short Term Goals - 09/30/15 1036    PT SHORT TERM GOAL #1   Title Patient will express understanding of basic self care (use of heat,ice)  and ROM   10/18/15   Status Achieved   PT SHORT TERM GOAL #2   Title Improved hip flexion bilaterally to 95 degrees for greater ease going up and down steps and putting on pants   Time 4   Period Weeks   Status On-going   PT SHORT TERM GOAL #3   Title Patient will be able to single leg balance 8  sec on R/L needed for putting on/taking of pants   Status Achieved   PT SHORT TERM GOAL #4   Title The patient will report a 25% improvement in pain with sitting  and walking   Time 4   Period Weeks   Status Achieved           PT Long Term Goals - 09/30/15 1042    PT LONG TERM GOAL #1   Title The patient will be independent in HEP for further improvements in ROM, strength and function  11/15/15   Time 8   Period Weeks   Status On-going   PT LONG TERM GOAL #2   Title The patient will have improved bilateral HS length to 60 degrees and hip flexor lengths to 10 degees needed for walking longer periods of time.   Time 8   Period Weeks   Status On-going   PT LONG TERM GOAL #3   Title Improved bilateral hip extension and hip abd strength improved to grossly 4/5 needed for ascending and descending steps with greater ease   Time 8   Period Weeks   Status On-going   PT LONG TERM GOAL #4   Title The patient will report an overall improvement in sitting and walking tolerance by 50%   Time 8   Period Weeks   Status On-going   PT LONG TERM GOAL #5   Title FOTO functional outcome score  improved from 56% to 42% indicating improved function with less pain   Time 8   Period Weeks   Status On-going               Plan - 09/30/15 1031    Clinical Impression Statement Patient denies pain during exerices but has muscular fatigue in bilateral hips.  Point tenderness over bursa.  Patient notes overall improvement at 70-75%.  Able to SLS on left 10 sec.  No change in hip flexion.  Improved FABER but still limited.    Therapist closely monitoring response.     PT Next Visit Plan continue ionto trial #3 today, Nu-step;  continue hip isometrics and/or band hip;  red band to clams; lateral step ups;  add piriformis/FABER stretch with ball         Problem List Patient Active Problem List   Diagnosis Date Noted  . Bilateral hip pain 09/14/2015  . Routine general medical examination at a health care facility 09/14/2015  . LBBB (left bundle branch block) 12/23/2014  . Hypokalemia 12/23/2014  . Hematochezia 12/23/2014  . Abdominal pain   . Diverticulitis of large intestine without perforation or abscess without bleeding   . Pain in the chest   . Diverticulitis 12/18/2014  . Solitary pulmonary nodule 06/09/2014  . Left lower lobe pneumonia 10/06/2013  . Urinary incontinence in female 03/02/2013  . Postmenopause atrophic vaginitis 03/02/2013  . RUQ PAIN 04/01/2007  . Essential hypertension 03/24/2007  . Diverticulitis of colon 03/17/2007    [name] 09/30/2015, 10:53 AM  [facility] [address], [place], [place], [address] Phone: [phone]   Fax:  [phone]  Name: Cheryl Barnes MRN: [id number] Date of Birth: 17-Mar-1951    Ruben Im, PT 09/30/2015 10:54 AM Phone: [phone] Fax: [phone]

## 2015-10-04 ENCOUNTER — Ambulatory Visit: Payer: BLUE CROSS/BLUE SHIELD | Admitting: Physical Therapy

## 2015-10-04 DIAGNOSIS — M25552 Pain in left hip: Principal | ICD-10-CM

## 2015-10-04 DIAGNOSIS — R29898 Other symptoms and signs involving the musculoskeletal system: Secondary | ICD-10-CM

## 2015-10-04 DIAGNOSIS — M25651 Stiffness of right hip, not elsewhere classified: Secondary | ICD-10-CM

## 2015-10-04 DIAGNOSIS — M25652 Stiffness of left hip, not elsewhere classified: Secondary | ICD-10-CM

## 2015-10-04 DIAGNOSIS — M25551 Pain in right hip: Secondary | ICD-10-CM | POA: Diagnosis not present

## 2015-10-04 DIAGNOSIS — R262 Difficulty in walking, not elsewhere classified: Secondary | ICD-10-CM

## 2015-10-04 NOTE — Patient Instructions (Signed)

## 2015-10-04 NOTE — Therapy (Signed)
[facility] [address], [place], [place], [address] Phone: [phone]   Fax:  [phone]  Physical Therapy Treatment  Patient Details  Name: Cheryl Barnes MRN: [id number] Date of Birth: May 23, 1951 Referring Provider: Dr. Sherren Mocha  Encounter Date: 10/04/2015      PT End of Session - 10/04/15 1054    Visit Number 4   Number of Visits 16   Date for PT Re-Evaluation 11/15/15   Authorization Type BCBS   PT Start Time 1015   PT Stop Time 1103   PT Time Calculation (min) 48 min   Activity Tolerance Patient tolerated treatment well      Past Medical History  Diagnosis Date  . Hypertension   . Migraine     history  . Allergy   . Diverticulitis     Past Surgical History  Procedure Laterality Date  . Cesarean section    . Fracture surgery    . Partial hysterectomy      Abdominal  . Cholecystectomy    . Rotator cuff repair Left     There were no vitals filed for this visit.  Visit Diagnosis:  Bilateral hip pain  Hip stiffness, left  Hip stiffness, right  Weakness of both lower extremities  Difficulty in walking      Subjective Assessment - 10/04/15 1014    Subjective Hurting pretty badly yesterday and some lingering some today.  Thinks it may be from diverticulitis flare up.  Reports minimal improvement with the patch.     Currently in Pain? Yes   Pain Score 4    Pain Location Hip   Pain Orientation Left   Pain Type Chronic pain   Pain Onset More than a month ago   Pain Frequency Intermittent                         [facility] Adult PT Treatment/Exercise - 10/04/15 0001    Lumbar Exercises: Supine   Ab Set 5 reps   Bridge 10 reps   Bridge Limitations with red ball   Isometric Hip Flexion 5 reps   Other Supine Lumbar Exercises red ball hip/knee flexion and lumbar rotation 15x each   Knee/Hip Exercises: Stretches   Piriformis Stretch Right;Left;3 reps;20 seconds   Knee/Hip  Exercises: Aerobic   Stationary Bike 5 min   Modalities   Modalities Moist Heat   Moist Heat Therapy   Number Minutes Moist Heat 10 Minutes   Moist Heat Location Hip   Manual Therapy   Manual Therapy Soft tissue mobilization   Joint Mobilization B long axis distraction; inferior hip mobs grade 3 30s each  sidelying pelvic distraction and flexion/rotation grade 3   Soft tissue mobilization left gluteals, piriformis          Trigger Point Dry Needling - 10/04/15 1027    Consent Given? Yes   Education Handout Provided Yes   Muscles Treated Lower Body Gluteus maximus;Piriformis   Gluteus Maximus Response Twitch response elicited;Palpable increased muscle length   Piriformis Response Palpable increased muscle length              PT Education - 10/04/15 1029    Education provided Yes   Education Details dry needling after care   Person(s) Educated Patient   Methods Explanation;Demonstration   Comprehension Verbalized understanding          PT Short Term Goals - 10/04/15 1057    PT SHORT TERM GOAL #  1   Title Patient will express understanding of basic self care (use of heat,ice)  and ROM   10/18/15   Status Achieved   PT SHORT TERM GOAL #2   Title Improved hip flexion bilaterally to 95 degrees for greater ease going up and down steps and putting on pants   Time 4   Period Weeks   Status On-going   PT SHORT TERM GOAL #3   Title Patient will be able to single leg balance 8 sec on R/L needed for putting on/taking of pants   Status Achieved   PT SHORT TERM GOAL #4   Title The patient will report a 25% improvement in pain with sitting  and walking   Status Achieved           PT Long Term Goals - 10/04/15 1058    PT LONG TERM GOAL #1   Title The patient will be independent in HEP for further improvements in ROM, strength and function  11/15/15   Time 8   Period Weeks   Status On-going   PT LONG TERM GOAL #2   Title The patient will have improved bilateral HS  length to 60 degrees and hip flexor lengths to 10 degees needed for walking longer periods of time.   Time 8   Period Weeks   Status On-going   PT LONG TERM GOAL #3   Title Improved bilateral hip extension and hip abd strength improved to grossly 4/5 needed for ascending and descending steps with greater ease   Time 8   Period Weeks   Status On-going   PT LONG TERM GOAL #4   Title The patient will report an overall improvement in sitting and walking tolerance by 50%   Time 8   Period Weeks   Status On-going   PT LONG TERM GOAL #5   Title FOTO functional outcome score improved from 56% to 42% indicating improved function with less pain   Time 8   Period Weeks   Status On-going               Plan - 10/04/15 1055    Clinical Impression Statement The patient reports minimal recent change in her left lateral hip pain.  Tender points identified in gluteals and piriformis and patient is receptive to dry needling.  Improved muscle length and hip ROM following treatment session.  Therapist closely monitoring response throughout all interventions.     PT Next Visit Plan assess response to first DN;   Nu-step;  continue hip isometrics and/or band hip;  red band to clams; lateral step ups;  continue piriformis/FABER stretch with ball         Problem List Patient Active Problem List   Diagnosis Date Noted  . Bilateral hip pain 09/14/2015  . Routine general medical examination at a health care facility 09/14/2015  . LBBB (left bundle branch block) 12/23/2014  . Hypokalemia 12/23/2014  . Hematochezia 12/23/2014  . Abdominal pain   . Diverticulitis of large intestine without perforation or abscess without bleeding   . Pain in the chest   . Diverticulitis 12/18/2014  . Solitary pulmonary nodule 06/09/2014  . Left lower lobe pneumonia 10/06/2013  . Urinary incontinence in female 03/02/2013  . Postmenopause atrophic vaginitis 03/02/2013  . RUQ PAIN 04/01/2007  . Essential  hypertension 03/24/2007  . Diverticulitis of colon 03/17/2007    [name] 10/04/2015, 10:59 AM  [facility] [address], [address]  [place], [place], [address] Phone: [phone]   Fax:  [phone]  Name: KRISTYE BRUHL MRN: [id number] Date of Birth: [birth year]    Ruben Im, PT 10/04/2015 10:59 AM Phone: [phone] Fax: [phone]

## 2015-10-06 ENCOUNTER — Ambulatory Visit: Payer: BLUE CROSS/BLUE SHIELD

## 2015-10-06 DIAGNOSIS — M25651 Stiffness of right hip, not elsewhere classified: Secondary | ICD-10-CM

## 2015-10-06 DIAGNOSIS — M25551 Pain in right hip: Secondary | ICD-10-CM | POA: Diagnosis not present

## 2015-10-06 DIAGNOSIS — R29898 Other symptoms and signs involving the musculoskeletal system: Secondary | ICD-10-CM

## 2015-10-06 DIAGNOSIS — M25552 Pain in left hip: Principal | ICD-10-CM

## 2015-10-06 DIAGNOSIS — M25652 Stiffness of left hip, not elsewhere classified: Secondary | ICD-10-CM

## 2015-10-06 DIAGNOSIS — R262 Difficulty in walking, not elsewhere classified: Secondary | ICD-10-CM

## 2015-10-06 NOTE — Therapy (Signed)
[facility] [address], [place], [place], [address] Phone: [phone]   Fax:  [phone]  Physical Therapy Treatment  Patient Details  Name: Cheryl Barnes MRN: [id number] Date of Birth: September 28, 1950 Referring Provider: Dr. Sherren Mocha  Encounter Date: 10/06/2015      PT End of Session - 10/06/15 1059    Visit Number 5   Date for PT Re-Evaluation 11/15/15   PT Start Time 1014   PT Stop Time 1059   PT Time Calculation (min) 45 min   Activity Tolerance Patient tolerated treatment well   Behavior During Therapy [facility] for tasks assessed/performed      Past Medical History  Diagnosis Date  . Hypertension   . Migraine     history  . Allergy   . Diverticulitis     Past Surgical History  Procedure Laterality Date  . Cesarean section    . Fracture surgery    . Partial hysterectomy      Abdominal  . Cholecystectomy    . Rotator cuff repair Left     There were no vitals filed for this visit.  Visit Diagnosis:  Bilateral hip pain  Hip stiffness, left  Hip stiffness, right  Weakness of both lower extremities  Difficulty in walking      Subjective Assessment - 10/06/15 1020    Subjective Feeling so much better after dry needling.    Diagnostic tests x-ray OA/degeneration   Patient Stated Goals be able to dress without hurting,  wake up without pain, walk and sit longer   Currently in Pain? Yes   Pain Score 2    Pain Location Hip   Pain Orientation Left   Pain Descriptors / Indicators Aching   Pain Type Chronic pain   Pain Onset More than a month ago   Pain Frequency Intermittent   Aggravating Factors  morning hours   Pain Relieving Factors dry needling, heat, cold                         [facility] Adult PT Treatment/Exercise - 10/06/15 0001    Lumbar Exercises: Supine   Bridge 20 reps   Bridge Limitations with red ball   Other Supine Lumbar Exercises red ball hip/knee flexion and  lumbar rotation 15x each   Knee/Hip Exercises: Stretches   Active Hamstring Stretch Both;3 reps;20 seconds   Hip Flexor Stretch Both;3 reps;20 seconds  edge of mat   Piriformis Stretch Right;Left;3 reps;20 seconds  diagonal knee to chest   Other Knee/Hip Stretches single knee to chest 3x20 seconds with strap to assist   Knee/Hip Exercises: Aerobic   Stationary Bike Level 2x 5 minutes   Iontophoresis   Type of Iontophoresis Dexamethasone   Location Lt greater trochanter   Dose 1cc   #4   Time 6 hour wear   Manual Therapy   Manual Therapy Soft tissue mobilization   Soft tissue mobilization left gluteals, piriformis  orange roller                  PT Short Term Goals - 10/04/15 1057    PT SHORT TERM GOAL #1   Title Patient will express understanding of basic self care (use of heat,ice)  and ROM   10/18/15   Status Achieved   PT SHORT TERM GOAL #2   Title Improved hip flexion bilaterally to 95 degrees for greater ease going up and down steps and putting on pants  Time 4   Period Weeks   Status On-going   PT SHORT TERM GOAL #3   Title Patient will be able to single leg balance 8 sec on R/L needed for putting on/taking of pants   Status Achieved   PT SHORT TERM GOAL #4   Title The patient will report a 25% improvement in pain with sitting  and walking   Status Achieved           PT Long Term Goals - 10/04/15 1058    PT LONG TERM GOAL #1   Title The patient will be independent in HEP for further improvements in ROM, strength and function  11/15/15   Time 8   Period Weeks   Status On-going   PT LONG TERM GOAL #2   Title The patient will have improved bilateral HS length to 60 degrees and hip flexor lengths to 10 degees needed for walking longer periods of time.   Time 8   Period Weeks   Status On-going   PT LONG TERM GOAL #3   Title Improved bilateral hip extension and hip abd strength improved to grossly 4/5 needed for ascending and descending steps with  greater ease   Time 8   Period Weeks   Status On-going   PT LONG TERM GOAL #4   Title The patient will report an overall improvement in sitting and walking tolerance by 50%   Time 8   Period Weeks   Status On-going   PT LONG TERM GOAL #5   Title FOTO functional outcome score improved from 56% to 42% indicating improved function with less pain   Time 8   Period Weeks   Status On-going               Plan - 10/06/15 1029    Clinical Impression Statement Pt reports 75% overall improvement in pain in Lt buttock since the start of care and especially after needling last session.  Pt with improved tolerance for activity and exercises this session.  Pt with continued stiffness and weakness in Lt>Rt gluteals.  Pt will continue to benefit from skilled PT for dry needling, ionto, manual, flexibility and strength.     Pt will benefit from skilled therapeutic intervention in order to improve on the following deficits Decreased range of motion;Decreased endurance;Decreased strength;Hypomobility;Increased muscle spasms;Pain;Difficulty walking   Rehab Potential Good   PT Frequency 2x / week   PT Duration 8 weeks   PT Treatment/Interventions ADLs/Self Care Home Management;Cryotherapy;Electrical Stimulation;Moist Heat;Ultrasound;Iontophoresis 4mg /ml Dexamethasone;Therapeutic exercise;Patient/family education;Manual techniques;Dry needling;Taping   PT Next Visit Plan   Nu-step/bike;  continue hip isometrics and/or band hip;  red band to clams; lateral step ups;  continue piriformis flexibility.     Consulted and Agree with Plan of Care Patient        Problem List Patient Active Problem List   Diagnosis Date Noted  . Bilateral hip pain 09/14/2015  . Routine general medical examination at a health care facility 09/14/2015  . LBBB (left bundle branch block) 12/23/2014  . Hypokalemia 12/23/2014  . Hematochezia 12/23/2014  . Abdominal pain   . Diverticulitis of large intestine without  perforation or abscess without bleeding   . Pain in the chest   . Diverticulitis 12/18/2014  . Solitary pulmonary nodule 06/09/2014  . Left lower lobe pneumonia 10/06/2013  . Urinary incontinence in female 03/02/2013  . Postmenopause atrophic vaginitis 03/02/2013  . RUQ PAIN 04/01/2007  . Essential hypertension 03/24/2007  . Diverticulitis of colon 03/17/2007  Xavier Munger, PT 10/06/2015, 11:01 AM  [facility] [address] [place], [place], [address] Phone: [phone]   Fax:  [phone]  Name: Cheryl Barnes MRN: [id number] Date of Birth: Sep 30, 1950

## 2015-10-11 ENCOUNTER — Ambulatory Visit: Payer: BLUE CROSS/BLUE SHIELD | Admitting: Physical Therapy

## 2015-10-11 DIAGNOSIS — R29898 Other symptoms and signs involving the musculoskeletal system: Secondary | ICD-10-CM

## 2015-10-11 DIAGNOSIS — M25652 Stiffness of left hip, not elsewhere classified: Secondary | ICD-10-CM

## 2015-10-11 DIAGNOSIS — M25551 Pain in right hip: Secondary | ICD-10-CM | POA: Diagnosis not present

## 2015-10-11 DIAGNOSIS — M25651 Stiffness of right hip, not elsewhere classified: Secondary | ICD-10-CM

## 2015-10-11 DIAGNOSIS — R262 Difficulty in walking, not elsewhere classified: Secondary | ICD-10-CM

## 2015-10-11 DIAGNOSIS — M25552 Pain in left hip: Principal | ICD-10-CM

## 2015-10-11 NOTE — Therapy (Signed)
[facility] [address] [place], [place], [address] Phone: [phone]   Fax:  [phone]  Physical Therapy Treatment  Patient Details  Name: Cheryl Barnes MRN: [id number] Date of Birth: [birth year] Referring Provider: Dr. Sherren Mocha  Encounter Date: 10/11/2015      PT End of Session - 10/11/15 1714    Visit Number 6   Number of Visits 16   Date for PT Re-Evaluation 11/15/15   Authorization Type BCBS   PT Start Time 1015   PT Stop Time 1105   PT Time Calculation (min) 50 min   Activity Tolerance Patient tolerated treatment well      Past Medical History  Diagnosis Date  . Hypertension   . Migraine     history  . Allergy   . Diverticulitis     Past Surgical History  Procedure Laterality Date  . Cesarean section    . Fracture surgery    . Partial hysterectomy      Abdominal  . Cholecystectomy    . Rotator cuff repair Left     There were no vitals filed for this visit.  Visit Diagnosis:  Bilateral hip pain  Hip stiffness, left  Hip stiffness, right  Weakness of both lower extremities  Difficulty in walking      Subjective Assessment - 10/11/15 1012    Subjective Feeling good. Did some yardwork on Sunday.  No pain upon awakening.  Mornings have been better since "injections."  Has lateral trochanteric hip pain on bike but not Nu-Step.  Doing stretches and strengthening once a day.    Currently in Pain? Yes   Pain Score 6   with bike   Pain Location Hip   Pain Orientation Left   Aggravating Factors  sidelying on right on the couch;  some right hip hip pain with yard work (left affected side)                         [facility] Adult PT Treatment/Exercise - 10/11/15 0001    Knee/Hip Exercises: Aerobic   Nustep L2 5 min   Knee/Hip Exercises: Standing   Hip ADduction AROM;Strengthening;Left;1 set;5 reps   Lateral Step Up Right;Left;1 set;10 reps;Hand Hold: 2;Step Height: 6"   Other Standing Knee Exercises hip isometric hip abd and extension 5 sec 5 sec holds   Moist Heat Therapy   Number Minutes Moist Heat 10 Minutes   Moist Heat Location Hip   Manual Therapy   Joint Mobilization B long axis distraction; inferior hip mobs grade 3 30s each  sidelying pelvic distraction and flexion/rotation grade 3          Trigger Point Dry Needling - 10/11/15 1713    Consent Given? Yes   Gluteus Maximus Response Palpable increased muscle length   Piriformis Response Twitch response elicited;Palpable increased muscle length   Tensor Fascia Lata Response Palpable increased muscle length              PT Education - 10/11/15 1100    Education provided Yes   Education Details wall isometrics   Person(s) Educated Patient   Methods Explanation;Demonstration;Handout   Comprehension Verbalized understanding;Returned demonstration          PT Short Term Goals - 10/11/15 1718    PT SHORT TERM GOAL #1   Status Achieved   PT SHORT TERM GOAL #2   Time 4   Period Weeks   Status On-going   PT  SHORT TERM GOAL #3   Title Patient will be able to single leg balance 8 sec on R/L needed for putting on/taking of pants   Status Achieved   PT SHORT TERM GOAL #4   Title The patient will report a 25% improvement in pain with sitting  and walking   Status Achieved           PT Long Term Goals - 10/11/15 1718    PT LONG TERM GOAL #1   Title The patient will be independent in HEP for further improvements in ROM, strength and function  11/15/15   Time 8   Period Weeks   Status On-going   PT LONG TERM GOAL #2   Title The patient will have improved bilateral HS length to 60 degrees and hip flexor lengths to 10 degees needed for walking longer periods of time.   Time 8   Period Weeks   Status On-going   PT LONG TERM GOAL #3   Title Improved bilateral hip extension and hip abd strength improved to grossly 4/5 needed for ascending and descending steps with greater ease    Time 8   Period Weeks   Status On-going   PT LONG TERM GOAL #4   Title The patient will report an overall improvement in sitting and walking tolerance by 50%   Time 8   Period Weeks   Status On-going   PT LONG TERM GOAL #5   Title FOTO functional outcome score improved from 56% to 42% indicating improved function with less pain   Time 8   Period Weeks   Status On-going               Plan - 10/11/15 1715    Clinical Impression Statement The patient has point tender over left trochanteric bursa.  Decreased trigger point size and number in gluteals and piriformis.  Improving muscle length and ROM.  Hip abduction painful actively but able to do isometrics painlessly.  Therapist closely monitoring response throughout treatment session.     PT Next Visit Plan   Nu-step (no bike secondary to hip pain);  assess response to DN #2;  ionto patch as needed;   continue piriformis flexibility;  hip extension strengthening with band but hold hip abduction strengthening        Problem List Patient Active Problem List   Diagnosis Date Noted  . Bilateral hip pain 09/14/2015  . Routine general medical examination at a health care facility 09/14/2015  . LBBB (left bundle branch block) 12/23/2014  . Hypokalemia 12/23/2014  . Hematochezia 12/23/2014  . Abdominal pain   . Diverticulitis of large intestine without perforation or abscess without bleeding   . Pain in the chest   . Diverticulitis 12/18/2014  . Solitary pulmonary nodule 06/09/2014  . Left lower lobe pneumonia 10/06/2013  . Urinary incontinence in female 03/02/2013  . Postmenopause atrophic vaginitis 03/02/2013  . RUQ PAIN 04/01/2007  . Essential hypertension 03/24/2007  . Diverticulitis of colon 03/17/2007    [name] 10/11/2015, 5:19 PM  [facility] [address], [place], [place], [address] Phone: [phone]   Fax:  [phone]  Name: Cheryl Barnes MRN: [id number] Date of Birth: [birth year]    Ruben Im, PT 10/11/2015 5:20 PM Phone: [phone] Fax: [phone]

## 2015-10-11 NOTE — Patient Instructions (Signed)
Hip ext and abd isometrics against wall per handout 5x 5 sec holds

## 2015-10-13 ENCOUNTER — Ambulatory Visit: Payer: BLUE CROSS/BLUE SHIELD | Admitting: Physical Therapy

## 2015-10-13 DIAGNOSIS — M25652 Stiffness of left hip, not elsewhere classified: Secondary | ICD-10-CM

## 2015-10-13 DIAGNOSIS — M25551 Pain in right hip: Secondary | ICD-10-CM | POA: Diagnosis not present

## 2015-10-13 DIAGNOSIS — R262 Difficulty in walking, not elsewhere classified: Secondary | ICD-10-CM

## 2015-10-13 DIAGNOSIS — M25552 Pain in left hip: Principal | ICD-10-CM

## 2015-10-13 DIAGNOSIS — R29898 Other symptoms and signs involving the musculoskeletal system: Secondary | ICD-10-CM

## 2015-10-13 NOTE — Therapy (Signed)
[facility] [address], [place], [place], [address] Phone: [phone]   Fax:  [phone]  Physical Therapy Treatment  Patient Details  Name: Cheryl Barnes MRN: [id number] Date of Birth: March 29, 1951 Referring Provider: Dr. Sherren Mocha  Encounter Date: 10/13/2015      PT End of Session - 10/13/15 1033    Visit Number 7   Number of Visits 16   Date for PT Re-Evaluation 11/15/15   Authorization Type BCBS   PT Start Time 1015   PT Stop Time 1100   PT Time Calculation (min) 45 min   Activity Tolerance Patient tolerated treatment well      Past Medical History  Diagnosis Date  . Hypertension   . Migraine     history  . Allergy   . Diverticulitis     Past Surgical History  Procedure Laterality Date  . Cesarean section    . Fracture surgery    . Partial hysterectomy      Abdominal  . Cholecystectomy    . Rotator cuff repair Left     There were no vitals filed for this visit.  Visit Diagnosis:  Bilateral hip pain  Hip stiffness, left  Weakness of both lower extremities  Difficulty in walking      Subjective Assessment - 10/13/15 1020    Subjective I'm OK.  No major soreness after last visit.     Currently in Pain? Yes   Pain Score 2    Pain Location Hip   Pain Orientation Left   Pain Type Chronic pain            [facility] PT Assessment - 10/13/15 0001    AROM   Left Hip Flexion 90   Strength   Right Hip Extension 4/5   Right Hip ABduction 4-/5   Left Hip Extension 4/5   Left Hip ABduction 4-/5                     [facility] Adult PT Treatment/Exercise - 10/13/15 0001    Knee/Hip Exercises: Stretches   Other Knee/Hip Stretches single knee to chest 3x20 seconds with strap to assist   Knee/Hip Exercises: Aerobic   Nustep L2 5 min   Knee/Hip Exercises: Standing   Hip Extension Stengthening;Right;Left;1 set;15 reps  R/L red band   Knee/Hip Exercises: Supine   Bridges  Strengthening;Both;1 set;15 reps  over red ball   Moist Heat Therapy   Number Minutes Moist Heat 10 Minutes   Moist Heat Location Hip   Manual Therapy   Joint Mobilization B long axis distraction; inferior hip mobs grade 3 30s each  sidelying pelvic distraction and flexion/rotation grade 3   Soft tissue mobilization left gluteals, piriformis  orange roller          Trigger Point Dry Needling - 10/13/15 1029    Consent Given? Yes   Muscles Treated Lower Body Gluteus minimus;Gluteus maximus;Piriformis;Tensor fascia lata   Gluteus Maximus Response Palpable increased muscle length   Gluteus Minimus Response Palpable increased muscle length   Piriformis Response Palpable increased muscle length   Tensor Fascia Lata Response Palpable increased muscle length       Left only.         PT Short Term Goals - 10/13/15 1034    PT SHORT TERM GOAL #1   Title Patient will express understanding of basic self care (use of heat,ice)  and ROM   10/18/15   Status Achieved  PT SHORT TERM GOAL #2   Title Improved hip flexion bilaterally to 95 degrees for greater ease going up and down steps and putting on pants   Time 4   Period Weeks   Status Partially Met   PT SHORT TERM GOAL #3   Title Patient will be able to single leg balance 8 sec on R/L needed for putting on/taking of pants   Status Achieved   PT SHORT TERM GOAL #4   Title The patient will report a 25% improvement in pain with sitting  and walking   Status Achieved           PT Long Term Goals - 10/13/15 1034    PT LONG TERM GOAL #1   Title The patient will be independent in HEP for further improvements in ROM, strength and function  11/15/15   Time 8   Period Weeks   Status On-going   PT LONG TERM GOAL #2   Title The patient will have improved bilateral HS length to 60 degrees and hip flexor lengths to 10 degees needed for walking longer periods of time.   Time 8   Period Weeks   Status On-going   PT LONG TERM GOAL #3    Title Improved bilateral hip extension and hip abd strength improved to grossly 4/5 needed for ascending and descending steps with greater ease   Time 8   Period Weeks   Status On-going   PT LONG TERM GOAL #4   Title The patient will report an overall improvement in sitting and walking tolerance by 50%   Time 8   Period Weeks   Status On-going   PT LONG TERM GOAL #5   Title FOTO functional outcome score improved from 56% to 42% indicating improved function with less pain   Time 8   Period Weeks   Status On-going               Plan - 10/13/15 1033    Clinical Impression Statement The patient is progressing with hip ROM and hip extensor strength but hip abduction is still painful.  Decreased tender points in gluteals and piriformis and along TFL.  She feels the best relief with dry needling.  Progressing with goals.     PT Next Visit Plan   Nu-step;  assess response to DN #3;  ionto patch as needed;   continue piriformis flexibility;  hip extension strengthening with band but hold hip abduction strengthening        Problem List Patient Active Problem List   Diagnosis Date Noted  . Bilateral hip pain 09/14/2015  . Routine general medical examination at a health care facility 09/14/2015  . LBBB (left bundle branch block) 12/23/2014  . Hypokalemia 12/23/2014  . Hematochezia 12/23/2014  . Abdominal pain   . Diverticulitis of large intestine without perforation or abscess without bleeding   . Pain in the chest   . Diverticulitis 12/18/2014  . Solitary pulmonary nodule 06/09/2014  . Left lower lobe pneumonia 10/06/2013  . Urinary incontinence in female 03/02/2013  . Postmenopause atrophic vaginitis 03/02/2013  . RUQ PAIN 04/01/2007  . Essential hypertension 03/24/2007  . Diverticulitis of colon 03/17/2007    [name] 10/13/2015, 11:04 AM   [facility] [address] [place], [place],  [address] Phone: [phone]   Fax:  [phone]  Name: Cheryl Barnes MRN: [id number] Date of Birth: 08-23-1951    Ruben Im, PT 10/13/2015 11:08 AM  Phone: [phone] Fax: [phone]

## 2015-10-18 ENCOUNTER — Encounter: Payer: BLUE CROSS/BLUE SHIELD | Admitting: Physical Therapy

## 2015-10-20 ENCOUNTER — Ambulatory Visit: Payer: BLUE CROSS/BLUE SHIELD | Admitting: Physical Therapy

## 2015-10-20 DIAGNOSIS — R29898 Other symptoms and signs involving the musculoskeletal system: Secondary | ICD-10-CM

## 2015-10-20 DIAGNOSIS — M25551 Pain in right hip: Secondary | ICD-10-CM

## 2015-10-20 DIAGNOSIS — M25651 Stiffness of right hip, not elsewhere classified: Secondary | ICD-10-CM

## 2015-10-20 DIAGNOSIS — M25552 Pain in left hip: Principal | ICD-10-CM

## 2015-10-20 DIAGNOSIS — M25652 Stiffness of left hip, not elsewhere classified: Secondary | ICD-10-CM

## 2015-10-20 DIAGNOSIS — R262 Difficulty in walking, not elsewhere classified: Secondary | ICD-10-CM

## 2015-10-20 NOTE — Therapy (Signed)
[facility] [address] [place], [place], [address] Phone: [phone]   Fax:  [phone]  Physical Therapy Treatment/Discharge Summary  Patient Details  Name: Cheryl Barnes MRN: [id number] Date of Birth: 1951/06/16 Referring Provider: Dr. Sherren Mocha  Encounter Date: 10/20/2015      PT End of Session - 10/20/15 1031    Visit Number 8   Number of Visits 16   Date for PT Re-Evaluation 11/15/15   Authorization Type [employer]   PT Start Time 1013   PT Stop Time 1100   PT Time Calculation (min) 47 min   Activity Tolerance Patient tolerated treatment well      Past Medical History  Diagnosis Date  . Hypertension   . Migraine     history  . Allergy   . Diverticulitis     Past Surgical History  Procedure Laterality Date  . Cesarean section    . Fracture surgery    . Partial hysterectomy      Abdominal  . Cholecystectomy    . Rotator cuff repair Left     There were no vitals filed for this visit.  Visit Diagnosis:  Bilateral hip pain  Hip stiffness, left  Weakness of both lower extremities  Difficulty in walking  Hip stiffness, right      Subjective Assessment - 10/20/15 1013    Subjective It's good.  Morning stiffness.  I usually stretch on the side of the bed.  Did some yardwork and was sore but I wouldn't have been able to do it before I came here.     Currently in Pain? Yes   Pain Score 1    Pain Location Hip   Pain Orientation Left            [facility] PT Assessment - 10/20/15 0001    Observation/Other Assessments   Focus on Therapeutic Outcomes (FOTO)  43% limitation   AROM   Left Hip Flexion 95   Strength   Right Hip Extension 4+/5   Right Hip ABduction 4/5   Left Hip Extension 4/5   Left Hip ABduction 4/5   Flexibility   Hamstrings 75 degrees left                     [facility] Adult PT Treatment/Exercise - 10/20/15 0001    Knee/Hip Exercises: Aerobic   Nustep L2 6 min   Knee/Hip Exercises: Standing   Hip Extension Stengthening;Right;Left;1 set;15 reps  R/L red band   Forward Step Up Right;Left;1 set;15 reps   Other Standing Knee Exercises resisted walk FW/BW and sidestep R/L 20# 5x each   Moist Heat Therapy   Number Minutes Moist Heat --   Moist Heat Location --                PT Education - 10/20/15 1135    Education provided Yes   Education Details Discussion of HEP and safe, self progression;  osteoporosis, OA discussion; bursitis discussion   Person(s) Educated Patient   Methods Explanation   Comprehension Verbalized understanding          PT Short Term Goals - 10/20/15 1019    PT SHORT TERM GOAL #1   Title Patient will express understanding of basic self care (use of heat,ice)  and ROM   10/18/15   Status Achieved   PT SHORT TERM GOAL #2   Title Improved hip flexion bilaterally to 95 degrees for greater ease going up and down steps  and putting on pants   Time 4   Period Weeks   Status Partially Met   PT SHORT TERM GOAL #3   Title Patient will be able to single leg balance 8 sec on R/L needed for putting on/taking of pants   Status Achieved   PT SHORT TERM GOAL #4   Title The patient will report a 25% improvement in pain with sitting  and walking   Status Achieved           PT Long Term Goals - 10/20/15 1020    PT LONG TERM GOAL #1   Title The patient will be independent in HEP for further improvements in ROM, strength and function  11/15/15   PT LONG TERM GOAL #2   Title The patient will have improved bilateral HS length to 60 degrees and hip flexor lengths to 10 degees needed for walking longer periods of time.   Time 8   Period Weeks   Status Achieved   PT LONG TERM GOAL #3   Title Improved bilateral hip extension and hip abd strength improved to grossly 4/5 needed for ascending and descending steps with greater ease   Time 8   Period Weeks   Status Achieved   PT LONG TERM GOAL #4   Title The patient will report  an overall improvement in sitting and walking tolerance by 50%   Baseline 75% 10/19/14   Status Achieved   PT LONG TERM GOAL #5   Title FOTO functional outcome score improved from 56% to 42% indicating improved function with less pain   Baseline 43%   Time 8   Period Weeks   Status Partially Met               Plan - 10/20/15 1128    Clinical Impression Statement The patient reports an overall improvement at 75%.  Her hip ROM has improved significantly since initial evaluation.  She reports independence in her HEP and in safe self progression.  Discussed self care strategies and appropriate ex for osteoporosis.  Her FOTO functional outcome score has improved from 56% to 43%. The majority of goals have been met.  Recommend discharge from PT.          Problem List Patient Active Problem List   Diagnosis Date Noted  . Bilateral hip pain 09/14/2015  . Routine general medical examination at a health care facility 09/14/2015  . LBBB (left bundle branch block) 12/23/2014  . Hypokalemia 12/23/2014  . Hematochezia 12/23/2014  . Abdominal pain   . Diverticulitis of large intestine without perforation or abscess without bleeding   . Pain in the chest   . Diverticulitis 12/18/2014  . Solitary pulmonary nodule 06/09/2014  . Left lower lobe pneumonia 10/06/2013  . Urinary incontinence in female 03/02/2013  . Postmenopause atrophic vaginitis 03/02/2013  . RUQ PAIN 04/01/2007  . Essential hypertension 03/24/2007  . Diverticulitis of colon 03/17/2007    [name] 10/20/2015, 11:38 AM  [facility] [address], [place], [place], [address] Phone: [phone]   Fax:  [phone]  Name: Cheryl Barnes MRN: [id number] Date of Birth: 03/05/1951    PHYSICAL THERAPY DISCHARGE SUMMARY  Visits from Start of Care: 8  Current functional level related to goals / functional outcomes: Patient has met majority of  goals.  She expresses readiness for discharge from PT at this time.   Remaining deficits: See clinical impressions above   Education / Equipment: Comprehensive  HEP and safe self progression Plan: Patient agrees to discharge.  Patient goals were partially met. Patient is being discharged due to meeting the stated rehab goals.  ?????    Ruben Im, PT 10/20/2015 11:40 AM Phone: [phone] Fax: [phone]

## 2015-10-21 ENCOUNTER — Ambulatory Visit: Payer: BLUE CROSS/BLUE SHIELD | Admitting: Physical Therapy

## 2016-02-07 ENCOUNTER — Encounter: Payer: Self-pay | Admitting: Podiatry

## 2016-02-07 ENCOUNTER — Ambulatory Visit (INDEPENDENT_AMBULATORY_CARE_PROVIDER_SITE_OTHER): Payer: BLUE CROSS/BLUE SHIELD | Admitting: Podiatry

## 2016-02-07 DIAGNOSIS — L600 Ingrowing nail: Secondary | ICD-10-CM | POA: Diagnosis not present

## 2016-02-07 DIAGNOSIS — B351 Tinea unguium: Secondary | ICD-10-CM

## 2016-02-07 DIAGNOSIS — L03031 Cellulitis of right toe: Secondary | ICD-10-CM | POA: Diagnosis not present

## 2016-02-07 NOTE — Progress Notes (Signed)
Subjective:     Patient ID: Cheryl Barnes, female   DOB: February 13, 1951, 65 y.o.   MRN: [id number]  HPI this patient presents to the office with chief complaint of a painful great toe right foot states that 2 weeks ago she received a pedicure and proceeded to go to [place] while she was in [place]. Her great toe right foot became infected and painful. She proceeded to soak her infected toe daily and now returns to the office today 2 weeks after the pedicure for an evaluation and treatment of her nail. She states that there is intense pain noted on the inside and outside borders of the big toe, right foot walking and wearing her shoes   Review of Systems     Objective:   Physical Exam GENERAL APPEARANCE: Alert, conversant. Appropriately groomed. No acute distress.  VASCULAR: Pedal pulses are  palpable at  [facility] and PT bilateral.  Capillary refill time is immediate to all digits,  Normal temperature gradient.  Digital hair growth is present bilateral  NEUROLOGIC: sensation is normal to 5.07 monofilament at 5/5 sites bilateral.  Light touch is intact bilateral, Muscle strength normal.  MUSCULOSKELETAL: acceptable muscle strength, tone and stability bilateral.  Intrinsic muscluature intact bilateral.  Rectus appearance of foot and digits noted bilateral.   DERMATOLOGIC: skin color, texture, and turgor are within normal limits.  No preulcerative lesions or ulcers  are seen, no interdigital maceration noted.  No open lesions present.  . No drainage noted.  NAILS  Thick disfigured discolored nails both great toes.  Marked incurvation medial and lateral borders right hallux.  Pain and redness medial and lateral border right hallux.      Assessment:     Onychomycosis Hallux B/L  Ingrown toenail right hallux.  Paronychia medial and lateral border right hallux.    Plan:     IE  Nail surgery.  Treatment options and alternatives discussed.  Recommended permanent phenol matrixectomy and patient agreed.  Right  hallux  was prepped with alcohol and a toe block of 3cc of 2% lidocaine plain was administered in  a digital toe block. .  The toe was then prepped with betadine solution .  The offending nail border medial and lateral borders right hallux was then excised and matrix tissue exposed.  Phenol was then applied to the matrix tissue followed by an alcohol wash.  Antibiotic ointment and a dry sterile dressing was applied.  The patient was dispensed instructions for aftercare. RTC 1 week.   Gardiner Barefoot DPM     Gardiner Barefoot DPM

## 2016-02-16 ENCOUNTER — Encounter: Payer: Self-pay | Admitting: Podiatry

## 2016-02-16 ENCOUNTER — Ambulatory Visit (INDEPENDENT_AMBULATORY_CARE_PROVIDER_SITE_OTHER): Payer: BLUE CROSS/BLUE SHIELD | Admitting: Podiatry

## 2016-02-16 VITALS — BP 136/81 | HR 67 | Resp 14

## 2016-02-16 DIAGNOSIS — Z09 Encounter for follow-up examination after completed treatment for conditions other than malignant neoplasm: Secondary | ICD-10-CM

## 2016-02-16 NOTE — Progress Notes (Signed)
Subjective:     Patient ID: Cheryl Barnes, female   DOB: 10-06-1950, 65 y.o.   MRN: [id number]  HPI this patient returns to the office 1 week status post nail surgery of the right great toe. She had nail surgery for the removal of the medial and lateral borders of the right hallux excised and the matrix cauterized last visit. She states that she had pain following the surgery on the outside border of the right big toe. She states that the nail appears to have changed color from white to gray. She is having more pain upon exam then expected and she does have necrotic tissue noted along both nail grooves. She denies any drainage or pus at this time. She presents the office for an evaluation and treatment   Review of Systems     Objective:   Physical Exam GENERAL APPEARANCE: Alert, conversant. Appropriately groomed. No acute distress.  VASCULAR: Pedal pulses are  palpable at  [facility] and PT bilateral.  Capillary refill time is immediate to all digits,  Normal temperature gradient.  Digital hair growth is present bilateral  NEUROLOGIC: sensation is normal to 5.07 monofilament at 5/5 sites bilateral.  Light touch is intact bilateral, Muscle strength normal.  MUSCULOSKELETAL: acceptable muscle strength, tone and stability bilateral.  Intrinsic muscluature intact bilateral.  Rectus appearance of foot and digits noted bilateral.   DERMATOLOGIC: skin color, texture, and turgor are within normal limits.  No preulcerative lesions or ulcers  are seen, no interdigital maceration noted.  No open lesions present. . No drainage noted  NAILS  necrotic tissue noted along the nail grooves of the right hallux. There is evidence of a chemical burn at the base of the nail borders right great toe. Patient does have increased redness, but desquamation is noted at long the lateral border of the right great toe      Assessment:     S/p nail surgery     Plan:      ROV   the necrotic tissue noted along the medial and  lateral right great toe was removed at the proximal end of the nail border. There is healing noted at the base of the nail borders due to a phenol reaction the surgical site is healing. The patient is having pain out of proportion to what is normally seen. Patient was instructed to continue to soak it in Epsom salts and bandaged her toe. She is to return the office in one week for further evaluation and treatment   Gardiner Barefoot DPM

## 2016-02-23 ENCOUNTER — Ambulatory Visit (INDEPENDENT_AMBULATORY_CARE_PROVIDER_SITE_OTHER): Payer: BLUE CROSS/BLUE SHIELD | Admitting: Podiatry

## 2016-02-23 ENCOUNTER — Encounter: Payer: Self-pay | Admitting: Podiatry

## 2016-02-23 DIAGNOSIS — Z09 Encounter for follow-up examination after completed treatment for conditions other than malignant neoplasm: Secondary | ICD-10-CM

## 2016-02-23 NOTE — Progress Notes (Signed)
Patient ID: TICEY COTTLE, female   DOB: 09-25-1950, 65 y.o.   MRN: [id number] This patient returns to the office following nail surgery two weeks ago.  The patient says toe has been soaked and bandaged as directed.  There has been improvement of the toe since the surgery has been performed. The patient presents for continued evaluation and treatment.  GENERAL APPEARANCE: Alert, conversant. Appropriately groomed. No acute distress.  VASCULAR: Pedal pulses palpable at  [facility] and PT bilateral.  Capillary refill time is immediate to all digits,  Normal temperature gradient.    NEUROLOGIC: sensation is normal to 5.07 monofilament at 5/5 sites bilateral.  Light touch is intact bilateral, Muscle strength normal.  MUSCULOSKELETAL: acceptable muscle strength, tone and stability bilateral.  Intrinsic muscluature intact bilateral.  Rectus appearance of foot and digits noted bilateral.   DERMATOLOGIC: skin color, texture, and turgor are within normal limits.  No preulcerative lesions or ulcers  are seen, no interdigital maceration noted.   NAILS  There is necrotic tissue along the nail groove  In the absence of redness swelling and pain.  DX  S/p nail surgery  ROV  Home instructions were discussed.  Patient to call the office if there are any questions or concerns.   Gardiner Barefoot DPM

## 2016-03-20 ENCOUNTER — Ambulatory Visit (INDEPENDENT_AMBULATORY_CARE_PROVIDER_SITE_OTHER): Payer: BLUE CROSS/BLUE SHIELD | Admitting: Family Medicine

## 2016-03-20 VITALS — BP 150/90 | Temp 98.7°F | Ht 62.0 in | Wt 163.0 lb

## 2016-03-20 DIAGNOSIS — I447 Left bundle-branch block, unspecified: Secondary | ICD-10-CM

## 2016-03-20 DIAGNOSIS — I10 Essential (primary) hypertension: Secondary | ICD-10-CM

## 2016-03-20 NOTE — Patient Instructions (Signed)
Continue to monitor blood pressure at home and be in touch if consistently > 150/90.   Consider d/c potassium and repeat labs at physical.

## 2016-03-20 NOTE — Progress Notes (Signed)
Subjective:     Patient ID: Cheryl Barnes, female   DOB: 09/04/1950, 65 y.o.   MRN: [id number]  HPI Patient seen to have DMV papers completed. 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 She has history of hypertension which has been controlled very well by home readings on current regimen of amlodipine and Coreg. Compliant with therapy. No recent headaches, dizziness, or chest pains. She had hospitalization back in 2015 for acute diverticulitis. It was noted at that point that she had left bundle branch block. She has no history of CAD. She had cardiology evaluation with nuclear stress test that was unremarkable. She's had no chest pains whatsoever. Nonsmoker. No hx of syncope.  No seizure hx.  After reviewing medications, noted that she is taking potassium and she has never taken diuretics. Her potassiums have been consistently well controlled.  Past Medical History:  Diagnosis Date  . Allergy   . Diverticulitis   . Hypertension   . Migraine    history   Past Surgical History:  Procedure Laterality Date  . CESAREAN SECTION    . CHOLECYSTECTOMY    . FRACTURE SURGERY    . PARTIAL HYSTERECTOMY     Abdominal  . ROTATOR CUFF REPAIR Left     reports that she has never smoked. She does not have any smokeless tobacco history on file. She reports that she does not drink alcohol or use drugs. family history includes Asthma in her maternal grandfather; Breast cancer in her mother; Colon cancer in her father; Diabetes in her father; Hypertension in her father and mother. Allergies  Allergen Reactions  . Seasonal Ic [Cholestatin]   . Latex Itching and Rash  . Penicillins Itching and Rash      Review of Systems  Constitutional: Negative for fatigue.  Eyes: Negative for visual disturbance.  Respiratory: Negative for cough, chest tightness, shortness of breath and wheezing.   Cardiovascular: Negative for chest pain, palpitations and leg swelling.  Neurological:  Negative for dizziness, seizures, syncope, weakness, light-headedness and headaches.       Objective:   Physical Exam  Constitutional: She appears well-developed and well-nourished.  Eyes: Pupils are equal, round, and reactive to light.  Neck: Neck supple. No JVD present. No thyromegaly present.  Cardiovascular: Normal rate and regular rhythm.  Exam reveals no gallop.   Pulmonary/Chest: Effort normal and breath sounds normal. No respiratory distress. She has no wheezes. She has no rales.  Musculoskeletal: She exhibits no edema.  Neurological: She is alert.       Assessment:     #1 hypertension. Very well controlled by home readings. Borderline elevation today  #2 left bundle branch block. Previous nuclear stress testing normal    Plan:     -Monitor blood pressure closely at home. Be in touch if consistently greater than 150/90 -Discontinue potassium and will recheck basic metabolic panel at follow-up for physical this winter -Forms completed for DMV. No contraindications for driving at this time  Over 25 minutes were spent of which > 50% spent in direct interaction with patient reviewing her history, examining her, reviewing medications, and completing forms.  Eulas Post MD [facility]

## 2016-03-20 NOTE — Progress Notes (Signed)
Pre visit review using our clinic review tool, if applicable. No additional management support is needed unless otherwise documented below in the visit note. 

## 2016-03-21 ENCOUNTER — Ambulatory Visit: Payer: BLUE CROSS/BLUE SHIELD | Admitting: Family Medicine

## 2016-04-09 ENCOUNTER — Telehealth: Payer: Self-pay | Admitting: Family Medicine

## 2016-04-09 NOTE — Telephone Encounter (Signed)
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  Pt states she got notice she needs a recent BP check with a letter stating that. Is it ok to bring pt in for a BP check/nurse visit only?  Pt needs asap

## 2016-04-09 NOTE — Telephone Encounter (Signed)
Please advise pt to bring letter with her and need a visit with Dr. Elease Barnes for documentation purposes.

## 2016-04-09 NOTE — Telephone Encounter (Signed)
Pt has been scheduled.  °

## 2016-04-10 ENCOUNTER — Ambulatory Visit (INDEPENDENT_AMBULATORY_CARE_PROVIDER_SITE_OTHER): Payer: BLUE CROSS/BLUE SHIELD | Admitting: Family Medicine

## 2016-04-10 VITALS — BP 140/100 | HR 75 | Temp 98.1°F | Ht 62.0 in | Wt 165.4 lb

## 2016-04-10 DIAGNOSIS — I10 Essential (primary) hypertension: Secondary | ICD-10-CM

## 2016-04-10 NOTE — Progress Notes (Signed)
Subjective:     Patient ID: Cheryl Barnes, female   DOB: [birth year], 65 y.o.   MRN: [id number]  HPI Patient seen for follow-up hypertension. She plans to establish with new primary soon. Had recent DMV papers completed. They send back request for recent blood pressure documentation. Suspected white coat syndrome. She brings in a long long of recent readings from home and they've been consistently less than [id number] systolic and less than 80 diastolic. She takes amlodipine and carvedilol. Good compliance with therapy. No dizziness. No headaches. No chest pains.  Past Medical History:  Diagnosis Date  . Allergy   . Diverticulitis   . Hypertension   . Migraine    history   Past Surgical History:  Procedure Laterality Date  . CESAREAN SECTION    . CHOLECYSTECTOMY    . FRACTURE SURGERY    . PARTIAL HYSTERECTOMY     Abdominal  . ROTATOR CUFF REPAIR Left     reports that she has never smoked. She does not have any smokeless tobacco history on file. She reports that she does not drink alcohol or use drugs. family history includes Asthma in her maternal grandfather; Breast cancer in her mother; Colon cancer in her father; Diabetes in her father; Hypertension in her father and mother. Allergies  Allergen Reactions  . Seasonal Ic [Cholestatin]   . Latex Itching and Rash  . Penicillins Itching and Rash     Review of Systems  Constitutional: Negative for fatigue.  Eyes: Negative for visual disturbance.  Respiratory: Negative for cough, chest tightness, shortness of breath and wheezing.   Cardiovascular: Negative for chest pain, palpitations and leg swelling.  Neurological: Negative for dizziness, seizures, syncope, weakness, light-headedness and headaches.       Objective:   Physical Exam  Constitutional: She appears well-developed and well-nourished.  Neck: Neck supple. No thyromegaly present.  Cardiovascular: Normal rate and regular rhythm.   Pulmonary/Chest: Effort normal and  breath sounds normal. No respiratory distress. She has no wheezes. She has no rales.  Musculoskeletal: She exhibits no edema.       Assessment:     Hypertension. Probable white coat syndrome. Very well controlled blood pressure by home readings. We checked blood pressure repeat left arm seated at rest by me 156/84 and by her cuff 164/88.  Our assessment is that her blood pressure has been very well controlled by home readings    Plan:     -Continue current blood pressure medication regimen -We'll send a letter today to [facility] regarding good control blood pressure by home readings -We do not have any concern regarding her abilities to drive at this time  Eulas Post MD [facility]

## 2016-04-10 NOTE — Progress Notes (Signed)
Pre visit review using our clinic review tool, if applicable. No additional management support is needed unless otherwise documented below in the visit note. 

## 2016-04-10 NOTE — Patient Instructions (Signed)
Monitor blood pressure and be in touch if consistently > 140/90 We will send in your letter to the [facility] today.

## 2016-05-08 ENCOUNTER — Emergency Department (HOSPITAL_COMMUNITY): Payer: BLUE CROSS/BLUE SHIELD

## 2016-05-08 ENCOUNTER — Encounter (HOSPITAL_COMMUNITY): Payer: Self-pay | Admitting: Emergency Medicine

## 2016-05-08 ENCOUNTER — Emergency Department (HOSPITAL_COMMUNITY)
Admission: EM | Admit: 2016-05-08 | Discharge: 2016-05-08 | Disposition: A | Discharge status: Home / Self Care | Payer: BLUE CROSS/BLUE SHIELD | Attending: Emergency Medicine | Admitting: Emergency Medicine

## 2016-05-08 DIAGNOSIS — K5713 Diverticulitis of small intestine without perforation or abscess with bleeding: Secondary | ICD-10-CM | POA: Insufficient documentation

## 2016-05-08 DIAGNOSIS — Z91040 Latex allergy status: Secondary | ICD-10-CM | POA: Insufficient documentation

## 2016-05-08 DIAGNOSIS — I10 Essential (primary) hypertension: Secondary | ICD-10-CM | POA: Insufficient documentation

## 2016-05-08 DIAGNOSIS — R1032 Left lower quadrant pain: Secondary | ICD-10-CM | POA: Diagnosis present

## 2016-05-08 HISTORY — DX: Left bundle-branch block, unspecified: I44.7

## 2016-05-08 LAB — COMPREHENSIVE METABOLIC PANEL
ALT: 15 U/L (ref 14–54)
AST: 16 U/L (ref 15–41)
Albumin: 4.1 g/dL (ref 3.5–5.0)
Alkaline Phosphatase: 80 U/L (ref 38–126)
Anion gap: 7 (ref 5–15)
BILIRUBIN TOTAL: 0.5 mg/dL (ref 0.3–1.2)
BUN: 10 mg/dL (ref 6–20)
CO2: 26 mmol/L (ref 22–32)
Calcium: 9.6 mg/dL (ref 8.9–10.3)
Chloride: 108 mmol/L (ref 101–111)
Creatinine, Ser: 0.66 mg/dL (ref 0.44–1.00)
GFR calc Af Amer: >60 mL/min (ref >60)
Glucose, Bld: 100 mg/dL — ABNORMAL HIGH (ref 65–99)
Potassium: 3.3 mmol/L — ABNORMAL LOW (ref 3.5–5.1)
Sodium: 141 mmol/L (ref 135–145)
TOTAL PROTEIN: 7.5 g/dL (ref 6.5–8.1)

## 2016-05-08 LAB — URINALYSIS, ROUTINE W REFLEX MICROSCOPIC
Bilirubin Urine: NEGATIVE
GLUCOSE, UA: NEGATIVE mg/dL
Hgb urine dipstick: NEGATIVE
KETONES UR: 15 mg/dL — AB
Nitrite: NEGATIVE
PH: 6 (ref 5.0–8.0)
Protein, ur: NEGATIVE mg/dL
Specific Gravity, Urine: 1.015 (ref 1.005–1.030)

## 2016-05-08 LAB — URINE MICROSCOPIC-ADD ON

## 2016-05-08 LAB — CBC
HCT: 40 % (ref 36.0–46.0)
Hemoglobin: 13.3 g/dL (ref 12.0–15.0)
MCH: 29.8 pg (ref 26.0–34.0)
MCHC: 33.3 g/dL (ref 30.0–36.0)
MCV: 89.7 fL (ref 78.0–100.0)
PLATELETS: 193 10*3/uL (ref 150–400)
RBC: 4.46 MIL/uL (ref 3.87–5.11)
RDW: 13 % (ref 11.5–15.5)
WBC: 8.1 10*3/uL (ref 4.0–10.5)

## 2016-05-08 LAB — LIPASE, BLOOD: Lipase: 29 U/L (ref 11–51)

## 2016-05-08 MED ORDER — IOPAMIDOL (ISOVUE-300) INJECTION 61%
100.0000 mL | Freq: Once | INTRAVENOUS | Status: AC | PRN
  Administered 2016-05-08: 100 mL via INTRAVENOUS

## 2016-05-08 MED ORDER — CIPROFLOXACIN HCL 500 MG PO TABS: 500.0000 mg | ORAL_TABLET | Freq: Two times a day (BID) | ORAL | 0 refills | Status: DC

## 2016-05-08 MED ORDER — ONDANSETRON HCL 4 MG PO TABS
4.0000 mg | ORAL_TABLET | ORAL | Status: DC
  Filled 2016-05-08: qty 1

## 2016-05-08 MED ORDER — ONDANSETRON HCL 4 MG PO TABS: 4.0000 mg | ORAL_TABLET | Freq: Once | ORAL | Status: DC

## 2016-05-08 MED ORDER — METRONIDAZOLE 500 MG PO TABS: 500.0000 mg | ORAL_TABLET | Freq: Three times a day (TID) | ORAL | 0 refills | Status: DC

## 2016-05-08 MED ORDER — HYDROCODONE-ACETAMINOPHEN 5-325 MG PO TABS: 1.0000 | ORAL_TABLET | Freq: Four times a day (QID) | ORAL | 0 refills | Status: DC | PRN

## 2016-05-08 MED ORDER — DOCUSATE SODIUM 100 MG PO CAPS: 100.0000 mg | ORAL_CAPSULE | Freq: Two times a day (BID) | ORAL | 0 refills | Status: DC

## 2016-05-08 NOTE — Discharge Instructions (Signed)
You were seen for diverticulitis. You will be given antibiotics, ciprofloxacin and flagyl to take for 7 days. You will be given Norco for pain. Please take colace with this to prevent constipation

## 2016-05-08 NOTE — ED Provider Notes (Signed)
[facility] DEPT Provider Note   CSN: [id number] Arrival date & time: 05/08/16  1343     History   Chief Complaint Chief Complaint  Patient presents with  . Abdominal Pain       HPI   Cheryl Barnes is a 65 y.o. Female With significant history of diverticulosis and recurrent diverticulitis presents for left lower quadrant abdominal pain for the last 2 days. Patient has been sharp in nature worse with movement, and bearing down and felt with this. She denies rectal pain. Typically she reports low-grade fevers that started yesterday MAXIMUM TEMPERATURE 100.0. She reports some nausea over the last 2 days but denies emesis, diarrhea. Last bowel movement was this morning and was normal. She reports reduced by mouth intake during this time. She feels her abdomen is distended when feeling it with her hands, but does not note it when standing, nor does she note any discomfort from it. Otherwise she reports an upper respiratory tract infection that she's had for the last week with cough, increased phlegm production. She otherwise denies shortness of breath, chest pain, dysuria  Past Medical History:  Diagnosis Date  . Allergy   . Diverticulitis   . Hypertension   . LBBB (left bundle branch block)   . Migraine    history    Patient Active Problem List   Diagnosis Date Noted  . Bilateral hip pain 09/14/2015  . Routine general medical examination at a health care facility 09/14/2015  . LBBB (left bundle branch block) 12/23/2014  . Hypokalemia 12/23/2014  . Hematochezia 12/23/2014  . Abdominal pain   . Diverticulitis of large intestine without perforation or abscess without bleeding   . Pain in the chest   . Diverticulitis 12/18/2014  . Solitary pulmonary nodule 06/09/2014  . Left lower lobe pneumonia 10/06/2013  . Urinary incontinence in female 03/02/2013  . Postmenopause atrophic vaginitis 03/02/2013  . RUQ PAIN 04/01/2007  . Essential hypertension 03/24/2007  .  Diverticulitis of colon 03/17/2007    Past Surgical History:  Procedure Laterality Date  . CESAREAN SECTION    . CHOLECYSTECTOMY    . FRACTURE SURGERY    . PARTIAL HYSTERECTOMY     Abdominal  . ROTATOR CUFF REPAIR Left     OB History    No data available       Home Medications    Prior to Admission medications   Medication Sig Start Date End Date Taking? Authorizing Provider  acetaminophen (TYLENOL) 325 MG tablet Take 325-650 mg by mouth every 6 (six) hours as needed for mild pain or headache (or relief of upper respiratory symptoms).   Yes Historical Provider, MD  amLODipine (NORVASC) 5 MG tablet TAKE 1 TABLET DAILY (OFFICE VISIT FOR MORE REFILLS) Patient taking differently: Take 5 mg by mouth daily.  09/14/15  Yes Dorena Cookey, MD  Calcium Citrate-Vitamin D (CALCIUM + D PO) Take 600 mg by mouth 2 (two) times daily.   Yes Historical Provider, MD  carvedilol (COREG) 3.125 MG tablet Take 1 tablet (3.125 mg total) by mouth 2 (two) times daily with a meal. 09/14/15  Yes Dorena Cookey, MD  [facility]an[facility] DM) 30-600 MG 12hr tablet Take 1 tablet by mouth 2 (two) times daily as needed for cough.   Yes Historical Provider, MD  Docusate Sodium (COLACE PO) Take 100 mg by mouth daily as needed (to prevent hard stools).    Yes Historical Provider, MD  ibuprofen (ADVIL,MOTRIN) 200 MG tablet Take 400 mg by mouth  every 6 (six) hours as needed for headache or mild pain (or upper respiratory symptoms).   Yes Historical Provider, MD  loratadine (CLARITIN) 10 MG tablet Take 10 mg by mouth daily as needed for allergies.   Yes Historical Provider, MD  valACYclovir (VALTREX) 1000 MG tablet TAKE 1 TABLET BY MOUTH EVERY MORNING OR AS DIRECTED BY DOCTOR 09/14/15  Yes Dorena Cookey, MD    Family History Family History  Problem Relation Age of Onset  . Hypertension Father   . Diabetes Father     adult onset  . Colon cancer Father   . Hypertension Mother   . Breast cancer  Mother     breast  . Asthma Maternal Grandfather     Social History Social History  Substance Use Topics  . Smoking status: Never Smoker  . Smokeless tobacco: Never Used  . Alcohol use No     Allergies   Seasonal ic [cholestatin]; Tape; Latex; and Penicillins   Review of Systems Review of Systems  Constitutional: Positive for fever.  HENT: Positive for congestion.   Eyes: Negative.   Respiratory: Positive for cough.   Cardiovascular: Negative.   Gastrointestinal: Positive for abdominal distention and abdominal pain.  Genitourinary: Negative.   Musculoskeletal: Negative.   Neurological: Negative.      Physical Exam Updated Vital Signs BP 135/96 (BP Location: Right Arm)   Pulse 72   Temp 98.6 F (37 C) (Oral)   Resp 18   SpO2 96%   Physical Exam  Constitutional: She is oriented to person, place, and time. She appears well-developed and well-nourished.  HENT:  Head: Normocephalic and atraumatic.  Eyes: EOM are normal. Pupils are equal, round, and reactive to light.  Neck: Neck supple.  Cardiovascular: Normal rate, regular rhythm and normal heart sounds.   No murmur heard. Pulmonary/Chest: Effort normal and breath sounds normal. No respiratory distress. She has no wheezes.  Abdominal: Soft. Bowel sounds are normal. She exhibits no distension. There is tenderness. There is no guarding.  Left lower quadrant tenderness to palpation  Neurological: She is alert and oriented to person, place, and time.  Skin: Skin is warm and dry.  Psychiatric: She has a normal mood and affect.     ED Treatments / Results  Labs (all labs ordered are listed, but only abnormal results are displayed) Labs Reviewed  COMPREHENSIVE METABOLIC PANEL - Abnormal; Notable for the following:       Result Value   Potassium 3.3 (*)    Glucose, Bld 100 (*)    All other components within normal limits  LIPASE, BLOOD  CBC  URINALYSIS, ROUTINE W REFLEX MICROSCOPIC (NOT AT [facility])    EKG   EKG Interpretation None       Radiology No results found.  Procedures Procedures (including critical care time)  Medications Ordered in ED Medications - No data to display   Initial Impression / Assessment and Plan / ED Course  I have reviewed the triage vital signs and the nursing notes.  Pertinent labs & imaging results that were available during my care of the patient were reviewed by me and considered in my medical decision making (see chart for details).  Clinical Course    Uncomplicated clinical course  Final Clinical Impressions(s) / ED Diagnoses   Final diagnoses:  None   65 year old female with significant history of peptic ecchymosis, recurrent diverticulitis presenting for left lower quadrant pain. Differential includes diverticulitis, mesenteric adenitis, colitis, gastroenteritis. Patient remained afebrile in the emergency room,  white count was within normal limits. CT abd pelvis showed uncomplicated diverticulitis and she was discharged to complete a 7 day course of ciprofloxacin and metronidazole and follow with her PCP. She did not require pain medication in the ED and remained comfortable. She was able to tolerate PO prior to discharge. ED return precautins were reviewed and she expressed her understanding and acceptance of the plan   New Prescriptions New Prescriptions   No medications on file    Creasie Lacosse A. Lincoln Brigham MD, [facility] Resident PGY3 Pager [phone]    Veatrice Bourbon, MD 05/08/16 [id number]    Tanna Furry, MD 05/14/16 Dyann Kief

## 2016-05-08 NOTE — ED Triage Notes (Addendum)
Pt reports cough x 3 weeks worse when she lays down.  Hx LBBB.

## 2016-05-08 NOTE — ED Triage Notes (Signed)
Pt from home with c/o LLQ pain starting yesterday.  Pt reports decreased appetite and nausea.  Hx of diverticulitis in the same location but pt reports this doesn't feel the same.  Pain is worse with coughing.  NAD, A&O.

## 2016-05-08 NOTE — ED Provider Notes (Signed)
Patient seen and evaluated. Discussed with Dr. Lincoln Brigham. Agree with her assessment and plan as outlined above. Patient reports a history of prior episodes of diverticulitis. Had one episode where she was admitted for treatment. On several other episodes has been treated outpatient at home. Current symptoms started yesterday. States her symptoms progressed and she became painful more quickly than in the past. Otherwise, she states her symptoms are very similar. Pain in her left lower quadrant reading to her suprapubic abdomen. No fevers. Takes her children was up to 100 yesterday but no sweats shakes or chills. No urinary symptoms. Normal bowel movements without constipation or diarrhea.  On exam she has tenderness left lower quadrant without peritoneal irritation. CT scan shows uncommon gait is a 1 diverticulitis. Patient has declined pain medication here. Plan will be Cipro and Flagyl. Home treatment. Zofran when necessary, Vicodin when necessary. She states that she has gotten constipated in the past with pain medication. We'll give her a softener to use to play avoid this. I discussed return precautions including fever, worsening pain, inability to tolerate medications or symptoms at home.   Tanna Furry, MD 05/08/16 2016

## 2016-08-21 ENCOUNTER — Other Ambulatory Visit: Payer: Self-pay | Admitting: Family Medicine

## 2016-08-29 ENCOUNTER — Other Ambulatory Visit: Payer: BLUE CROSS/BLUE SHIELD

## 2016-08-29 ENCOUNTER — Other Ambulatory Visit (INDEPENDENT_AMBULATORY_CARE_PROVIDER_SITE_OTHER): Payer: BLUE CROSS/BLUE SHIELD

## 2016-08-29 DIAGNOSIS — Z0000 Encounter for general adult medical examination without abnormal findings: Secondary | ICD-10-CM | POA: Diagnosis not present

## 2016-08-29 LAB — POC URINALSYSI DIPSTICK (AUTOMATED)
Bilirubin, UA: NEGATIVE
Blood, UA: NEGATIVE
Glucose, UA: NEGATIVE
KETONES UA: NEGATIVE
Nitrite, UA: NEGATIVE
PROTEIN UA: NEGATIVE
Spec Grav, UA: 1.02
UROBILINOGEN UA: 0.2
pH, UA: 5.5

## 2016-08-29 LAB — BASIC METABOLIC PANEL
BUN: 19 mg/dL (ref 6–23)
CHLORIDE: 104 meq/L (ref 96–112)
CO2: 28 mEq/L (ref 19–32)
Calcium: 9.4 mg/dL (ref 8.4–10.5)
Creatinine, Ser: 0.66 mg/dL (ref 0.40–1.20)
GFR: 95.6 mL/min (ref >60.00)
Glucose, Bld: 99 mg/dL (ref 70–99)
POTASSIUM: 4.1 meq/L (ref 3.5–5.1)
SODIUM: 140 meq/L (ref 135–145)

## 2016-08-29 LAB — HEPATIC FUNCTION PANEL
ALBUMIN: 4.5 g/dL (ref 3.5–5.2)
ALT: 15 U/L (ref 0–35)
AST: 16 U/L (ref 0–37)
Alkaline Phosphatase: 92 U/L (ref 39–117)
BILIRUBIN TOTAL: 0.4 mg/dL (ref 0.2–1.2)
Bilirubin, Direct: 0.1 mg/dL (ref 0.0–0.3)
TOTAL PROTEIN: 7.2 g/dL (ref 6.0–8.3)

## 2016-08-29 LAB — CBC WITH DIFFERENTIAL/PLATELET
BASOS PCT: 0.5 % (ref 0.0–3.0)
Basophils Absolute: 0 10*3/uL (ref 0.0–0.1)
EOS PCT: 1.6 % (ref 0.0–5.0)
Eosinophils Absolute: 0.1 10*3/uL (ref 0.0–0.7)
HEMATOCRIT: 41 % (ref 36.0–46.0)
HEMOGLOBIN: 14 g/dL (ref 12.0–15.0)
Lymphocytes Relative: 43.3 % (ref 12.0–46.0)
Lymphs Abs: 2.2 10*3/uL (ref 0.7–4.0)
MCHC: 34.1 g/dL (ref 30.0–36.0)
MCV: 88.3 fl (ref 78.0–100.0)
MONO ABS: 0.6 10*3/uL (ref 0.1–1.0)
MONOS PCT: 11.1 % (ref 3.0–12.0)
Neutro Abs: 2.2 10*3/uL (ref 1.4–7.7)
Neutrophils Relative %: 43.5 % (ref 43.0–77.0)
Platelets: 195 10*3/uL (ref 150.0–400.0)
RBC: 4.64 Mil/uL (ref 3.87–5.11)
RDW: 13.4 % (ref 11.5–15.5)
WBC: 5.1 10*3/uL (ref 4.0–10.5)

## 2016-08-29 LAB — LIPID PANEL
CHOLESTEROL: 203 mg/dL — AB (ref 0–200)
HDL: 49.3 mg/dL (ref >39.00)
LDL Cholesterol: 132 mg/dL — ABNORMAL HIGH (ref 0–99)
NonHDL: 153.31
TRIGLYCERIDES: 106 mg/dL (ref 0.0–149.0)
Total CHOL/HDL Ratio: 4
VLDL: 21.2 mg/dL (ref 0.0–40.0)

## 2016-08-29 LAB — TSH: TSH: 1.12 u[IU]/mL (ref 0.35–4.50)

## 2016-09-05 ENCOUNTER — Ambulatory Visit (INDEPENDENT_AMBULATORY_CARE_PROVIDER_SITE_OTHER): Payer: BLUE CROSS/BLUE SHIELD | Admitting: Family Medicine

## 2016-09-05 ENCOUNTER — Encounter: Payer: Self-pay | Admitting: Family Medicine

## 2016-09-05 VITALS — BP 128/80 | HR 73 | Resp 12 | Ht 62.0 in | Wt 164.1 lb

## 2016-09-05 DIAGNOSIS — I10 Essential (primary) hypertension: Secondary | ICD-10-CM

## 2016-09-05 DIAGNOSIS — R32 Unspecified urinary incontinence: Secondary | ICD-10-CM

## 2016-09-05 DIAGNOSIS — E7800 Pure hypercholesterolemia, unspecified: Secondary | ICD-10-CM | POA: Diagnosis not present

## 2016-09-05 DIAGNOSIS — Z1231 Encounter for screening mammogram for malignant neoplasm of breast: Secondary | ICD-10-CM | POA: Diagnosis not present

## 2016-09-05 DIAGNOSIS — Z6831 Body mass index (BMI) 31.0-31.9, adult: Secondary | ICD-10-CM | POA: Insufficient documentation

## 2016-09-05 DIAGNOSIS — Z6830 Body mass index (BMI) 30.0-30.9, adult: Secondary | ICD-10-CM | POA: Diagnosis not present

## 2016-09-05 DIAGNOSIS — Z1159 Encounter for screening for other viral diseases: Secondary | ICD-10-CM

## 2016-09-05 DIAGNOSIS — E66811 Obesity, class 1: Secondary | ICD-10-CM | POA: Insufficient documentation

## 2016-09-05 DIAGNOSIS — Z0000 Encounter for general adult medical examination without abnormal findings: Secondary | ICD-10-CM | POA: Diagnosis not present

## 2016-09-05 DIAGNOSIS — M19049 Primary osteoarthritis, unspecified hand: Secondary | ICD-10-CM

## 2016-09-05 DIAGNOSIS — Z1239 Encounter for other screening for malignant neoplasm of breast: Secondary | ICD-10-CM

## 2016-09-05 MED ORDER — CARVEDILOL 3.125 MG PO TABS: 3.1250 mg | ORAL_TABLET | Freq: Two times a day (BID) | ORAL | 1 refills | Status: DC

## 2016-09-05 MED ORDER — PHENTERMINE-TOPIRAMATE ER 3.75-23 MG PO CP24: 1.0000 | ORAL_CAPSULE | Freq: Every day | ORAL | 0 refills | Status: DC

## 2016-09-05 MED ORDER — AMLODIPINE BESYLATE 5 MG PO TABS: 5.0000 mg | ORAL_TABLET | Freq: Every day | ORAL | 1 refills | Status: DC

## 2016-09-05 NOTE — Progress Notes (Signed)
Pre visit review using our clinic review tool, if applicable. No additional management support is needed unless otherwise documented below in the visit note. 

## 2016-09-05 NOTE — Progress Notes (Signed)
HPI:   [name] is a 66 y.o. female, who is here today for her routine physical. She also has a few concerns she would lie to address today and following on some chronic medical problems. Former pt of Dr Sherren Mocha.     She exercises some regularly and follows a healthy diet.  She lives alone, family close by. She retired a year ago.  Pap smear 08/2015. S/P hysterectomy 30 years ago, performed because heavy menses and dysmenorrhea.  Hx of abnormal pap smears: Denies Hx of STD's : Denies She is not sexually active.  G:3 L2 A 1(miscarriage)  Mammogram: 07/2014 Colonoscopy 10/2013: diverticulosis. She has ahd episodes of diverticulitis.  DEXA: 2015, osteopenia.  FHx for gynecologic: Mother breast cancer. Father dies at 6 from colon cancer.      Hep C screening: Denies risk factors, she would like test done today.  Concerns:  She is c/o hand joint pain for the past couple months, bilateral MCP and right 3rd MCP, pain seems to be better now. Exacerbated by movement and alleviated by rest and Ibuprofen 800 mg. She has noted some constant joint swelling, no erythema, no limitation of ROM.She has not had injuries. She has Hx of other arthralgias: hips and shoulders.   -"Bladder leakage" for about 1-2 years. Symptoms exacerbated by sneezing, laughing, and coughing. + Urinary urgency.  Negative for nocturia, gross hematuria, or changes in frequency. Hx of atrophic vaginitis, she tried topical Premarin but did not help. She has not noted associated abdominal pain.   -Not able to lose wt: She states that she has tried to lose wt and she able to do so. Her niece takes Qsymia and she would like to try.  She is not counting calories. Exercises 3 times 15-20 minof joint movement exercises.   Hypertension:  Currently on Amlodipine 5 mg and Coreg 3.125 mg bid. Hx of LBBB, she used to follow with cardiologist and according to pt, she was released. She is taking  medications as instructed, no side effects reported.  She has not noted unusual headache, visual changes, exertional chest pain, dyspnea,  focal weakness, or edema.   Lab Results  Component Value Date   CREATININE 0.66 08/29/2016   BUN 19 08/29/2016   NA 140 08/29/2016   K 4.1 08/29/2016   CL 104 08/29/2016   CO2 28 08/29/2016    Recurrent labial herpes: She takes Valtrex as needed. Episodes are seldom.   Hyperlipidemia:  Currently on non pharmacologic treatment. Following a low fat diet: Yes.    Lab Results  Component Value Date   CHOL 203 (H) 08/29/2016   HDL 49.30 08/29/2016   LDLCALC 132 (H) 08/29/2016   LDLDIRECT 120.6 08/22/2011   TRIG 106.0 08/29/2016   CHOLHDL 4 08/29/2016      Review of Systems  Constitutional: Negative for appetite change, chills, fatigue, fever and unexpected weight change.  HENT: Negative for dental problem, hearing loss, mouth sores, trouble swallowing and voice change.   Eyes: Negative for photophobia, pain and visual disturbance.  Respiratory: Negative for cough, shortness of breath and wheezing.   Cardiovascular: Negative for chest pain, palpitations and leg swelling.  Gastrointestinal: Negative for abdominal pain, nausea and vomiting.       No changes in bowel habits.  Endocrine: Negative for cold intolerance, heat intolerance, polydipsia, polyphagia and polyuria.  Genitourinary: Positive for urgency. Negative for decreased urine volume, difficulty urinating, dysuria, frequency, hematuria, vaginal bleeding and vaginal discharge.  Musculoskeletal:  Positive for arthralgias and joint swelling. Negative for back pain, gait problem and neck pain.  Skin: Negative for color change and rash.  Neurological: Negative for syncope, weakness, numbness and headaches.  Hematological: Negative for adenopathy. Does not bruise/bleed easily.  Psychiatric/Behavioral: Negative for confusion and sleep disturbance. The patient is not nervous/anxious.     All other systems reviewed and are negative.     Current Outpatient Prescriptions on File Prior to Visit  Medication Sig Dispense Refill  . Calcium Citrate-Vitamin D (CALCIUM + D PO) Take 600 mg by mouth 2 (two) times daily.    Mariane Baumgarten Sodium (COLACE PO) Take 100 mg by mouth daily as needed (to prevent hard stools).     Marland Kitchen docusate sodium (COLACE) 100 MG capsule Take 1 capsule (100 mg total) by mouth 2 (two) times daily. 30 capsule 0  . loratadine (CLARITIN) 10 MG tablet Take 10 mg by mouth daily as needed for allergies.    . valACYclovir (VALTREX) 1000 MG tablet TAKE 1 TABLET BY MOUTH EVERY MORNING OR AS DIRECTED BY DOCTOR (Patient taking differently: 500 mg daily as needed. TAKE 1 TABLET BY MOUTH EVERY MORNING OR AS DIRECTED BY DOCTOR) 90 tablet 1   No current facility-administered medications on file prior to visit.      Past Medical History:  Diagnosis Date  . Allergy   . Diverticulitis   . Hypertension   . LBBB (left bundle branch block)   . Migraine    history    Allergies  Allergen Reactions  . Seasonal Ic [Cholestatin] Other (See Comments)    Upper resp, sore throat, sneezing, etc.  . Tape Other (See Comments)    Tape causes some redness & causes BLISTERS if left on for too long  . Latex Itching and Rash  . Penicillins Itching and Rash    Has patient had a PCN reaction causing immediate rash, facial/tongue/throat swelling, SOB or lightheadedness with hypotension: Yes Has patient had a PCN reaction causing severe rash involving mucus membranes or skin necrosis: No Has patient had a PCN reaction that required hospitalization No Has patient had a PCN reaction occurring within the last 10 years: No If all of the above answers are "NO", then may proceed with Cephalosporin use.     Family History  Problem Relation Age of Onset  . Hypertension Father   . Diabetes Father     adult onset  . Colon cancer Father   . Hypertension Mother   . Breast cancer Mother      breast  . Cancer Mother     breast  . Asthma Maternal Grandfather   . Hypertension Brother   . Diabetes Brother     Social History   Social History  . Marital status: Single    Spouse name: N/A  . Number of children: N/A  . Years of education: N/A   Occupational History  . ophthalmology @ [employer]  . Smoking status: Never Smoker  . Smokeless tobacco: Never Used  . Alcohol use No  . Drug use: No  . Sexual activity: No   Other Topics Concern  . None   Social History Narrative  . None     Vitals:   09/05/16 0916  BP: 128/80  Pulse: 73  Resp: 12   O2 sat 95% at RA.  Body mass index is 30.01 kg/m.    Wt Readings from Last 3 Encounters:  09/05/16 164 lb 1 oz (  74.4 kg)  04/10/16 165 lb 6.4 oz (75 kg)  03/20/16 163 lb (73.9 kg)     Physical Exam  Nursing note and vitals reviewed. Constitutional: She is oriented to person, place, and time. She appears well-developed. No distress.  HENT:  Head: Atraumatic.  Right Ear: Hearing, tympanic membrane, external ear and ear canal normal.  Left Ear: Hearing, tympanic membrane, external ear and ear canal normal.  Mouth/Throat: Uvula is midline, oropharynx is clear and moist and mucous membranes are normal.  Eyes: Conjunctivae and EOM are normal. Pupils are equal, round, and reactive to light.  Cardiovascular: Normal rate and regular rhythm.   No murmur heard. Pulses:      Dorsalis pedis pulses are 2+ on the right side, and 2+ on the left side.  Respiratory: Effort normal and breath sounds normal. No respiratory distress.  GI: Soft. She exhibits no mass. There is no hepatomegaly. There is no tenderness.  Genitourinary: No breast swelling or tenderness.  Genitourinary Comments: Breast no masses or nipple discharge appreciated.  Musculoskeletal: She exhibits no edema or tenderness.  No major deformity or sigs of synovitis appreciated. Normal ROM of joints in  general.  Tenderness upon palpation of 1st MCP bilateral and right 3rd right MCP. Pain is not elicited on radial styloid with Finkelstein maneuver.  Lymphadenopathy:    She has no cervical adenopathy.    She has no axillary adenopathy.       Right: No supraclavicular adenopathy present.       Left: No supraclavicular adenopathy present.  Neurological: She is alert and oriented to person, place, and time. She has normal strength. No cranial nerve deficit. Coordination and gait normal.  Reflex Scores:      Bicep reflexes are 2+ on the right side and 2+ on the left side.      Patellar reflexes are 2+ on the right side and 2+ on the left side. Skin: Skin is warm. No rash noted. No erythema.  Psychiatric: She has a normal mood and affect. Her speech is normal.  Well groomed, good eye contact.      ASSESSMENT AND PLAN:      Xiao was seen today for annual exam.  Diagnoses and all orders for this visit:  Routine general medical examination at a health care facility   We discussed the importance of regular physical activity and healthy diet for prevention of chronic illness and/or complications. Preventive guidelines reviewed. Vaccination up to date. Fall prevention. Ca++ and vit D supplementation recommended. Next CPE in 1-2 years.   Urinary incontinence in female  Mixed. We discussed treatment options. For stress incontinence Kegel exercises recommended. She is not interested in pharmacologic treatment for urinary urgency. Avoid constipation/straining.   Essential hypertension  Adequately controlled. No changes in current management. DASH diet recommended. Eye exam recommended annually. F/U in 6 months, before if needed.  -     carvedilol (COREG) 3.125 MG tablet; Take 1 tablet (3.125 mg total) by mouth 2 (two) times daily with a meal. -     amLODipine (NORVASC) 5 MG tablet; Take 1 tablet (5 mg total) by mouth daily.  Pure hypercholesterolemia  Continue non  pharmacologic treatment. F/U in 12 months.  BMI 30.0-30.9,adult  We discussed benefits of wt loss as well as adverse effects of obesity. Consistency with healthy diet and physical activity recommended. Weight Watchers is a good option as well as daily brisk walking for 15-30 min as tolerated. Wt loss clinic. She really would like to try  pharmacologic treatment, Rx for low dose Qsymia given, explained that I am not sure about insurance coverage, monitor BP. F/U in 4 weeks.   -     Phentermine-Topiramate 3.75-23 MG CP24; Take 1 capsule by mouth daily before breakfast.  Encounter for hepatitis C screening test for low risk patient -     Hepatitis C antibody screen  Breast cancer screening -     MM SCREENING BREAST TOMO BILATERAL; Future  Primary localized osteoarthrosis of hand, unspecified laterality  OTC Aleve, Ibuprofen or Tylenol. Some side effects of NSAID's discussed. F/U as needed.     Return in 4 weeks (on 10/03/2016) for obesity.      [name]. [name], MD  [facility]. [place] office.

## 2016-09-05 NOTE — Patient Instructions (Addendum)
A few things to remember from today's visit:   Routine general medical examination at a health care facility  Essential hypertension  Pure hypercholesterolemia  BMI 30.0-30.9,adult - Plan: Phentermine-Topiramate 3.75-23 MG CP24  Encounter for hepatitis C screening test for low risk patient - Plan: Hepatitis C antibody screen  Breast cancer screening - Plan: MM SCREENING BREAST TOMO BILATERAL  Primary localized osteoarthrosis of hand, unspecified laterality    At least 150 minutes of moderate exercise per week, daily brisk walking for 15-30 min is a good exercise option. Healthy diet low in saturated (animal) fats and sweets and consisting of fresh fruits and vegetables, lean meats such as fish and white chicken and whole grains.   - Vaccines:  Tdap vaccine every 10 years.  Shingles vaccine recommended at age 92, could be given after 65 years of age but not sure about insurance coverage.  Pneumonia vaccines:  Prevnar 13 at 65 and Pneumovax at 26.  Screening recommendations for low/normal risk women:  Screening for diabetes at age 31-45 and every 3 years.  Cervical cancer prevention:  Hysterectomy  -Breast cancer: Mammogram: There is disagreement between experts about when to start screening in low risk asymptomatic female but recent recommendations are to start screening at 71 and not later than 65 years old , every 1-2 years and after 65 yo q 2 years. Screening is recommended until 65 years old but some women can continue screening depending of healthy issues.   Colon cancer screening: starts at 65 years old until 65 years old.  Cholesterol disorder screening at age 40 and every 3 years.   Today we also addressed new problems and chronic problems.  Caution with medication wt loss medication, monitor blood pressure.  Urine leakage:  This exercise helps with mild urine leakage associated with cough, laughing, or sneezing. It may help with other types of urine  incontinence and even with mild fecal incontinence.   Tighten and relax the pelvic muscles intermittently during the day. Once you are familiar with exercise try to hold pelvic muscles contraction for about 8-10 seconds.in the beginning you may not be able to hold contraction for more than a second or 2 but eventually you will be able to hold contraction harder and for longer time. Perform  8-12 exercises 3 times per day and daily for 15-20 weeks. You will need to continue exercises indefinitely to have a lasting effect.       Please be sure medication list is accurate. If a new problem present, please set up appointment sooner than planned today.

## 2016-09-06 LAB — HEPATITIS C ANTIBODY: HCV AB: NEGATIVE

## 2016-09-07 ENCOUNTER — Encounter: Payer: Self-pay | Admitting: Family Medicine

## 2016-09-07 ENCOUNTER — Telehealth: Payer: Self-pay | Admitting: Family Medicine

## 2016-09-07 NOTE — Telephone Encounter (Signed)
Pt states the pharmacy advised her if dr would wite the Phentermine-Topiramate 3.75-23 MG CP24  In 2 separate scripts, one for each of the 2 meds, it would be more afforable for her.  [facility] [address]  Pt hopes to get today. Pt would like a call back

## 2016-09-07 NOTE — Telephone Encounter (Signed)
Please see message. °

## 2016-09-10 ENCOUNTER — Telehealth: Payer: Self-pay | Admitting: Family Medicine

## 2016-09-10 DIAGNOSIS — I10 Essential (primary) hypertension: Secondary | ICD-10-CM

## 2016-09-10 MED ORDER — AMLODIPINE BESYLATE 5 MG PO TABS: 5.0000 mg | ORAL_TABLET | Freq: Every day | ORAL | 1 refills | Status: DC

## 2016-09-10 MED ORDER — CARVEDILOL 3.125 MG PO TABS: 3.1250 mg | ORAL_TABLET | Freq: Two times a day (BID) | ORAL | 1 refills | Status: DC

## 2016-09-10 NOTE — Telephone Encounter (Signed)
[facility] 

## 2016-09-10 NOTE — Telephone Encounter (Signed)
Pt states she needs her Rx carvedilol (COREG) 3.125 MG tablet amLODipine (NORVASC) 5 MG tablet  [facility]  She had told the dr to [facility], but doesn't need them sent there after all.  [facility]?

## 2016-09-11 MED ORDER — TOPIRAMATE ER 25 MG PO CAP24: 1.0000 | ORAL_CAPSULE | Freq: Every day | ORAL | 0 refills | Status: DC

## 2016-09-11 MED ORDER — PHENTERMINE HCL 15 MG PO CAPS: 15.0000 mg | ORAL_CAPSULE | ORAL | 0 refills | Status: DC

## 2016-09-11 NOTE — Telephone Encounter (Signed)
Rxs sent

## 2016-09-11 NOTE — Telephone Encounter (Signed)
She could try Topamax XR 25 mg daily and Phentermine cap 15 mg; both are higher doses that original Rx for Qsymia. #30 of each Rx/0. Monitor for side effects, pharmacist will print information for each medication.  Appt 4 weeks after medication started, so she may need to move appt. Thanks, BJ

## 2016-09-17 ENCOUNTER — Telehealth: Payer: Self-pay

## 2016-09-17 NOTE — Telephone Encounter (Signed)
Received PA request from [facility] for Trokendi XR 25 mg capsules. PA submitted & is pending. Key: [id number]

## 2016-09-18 NOTE — Telephone Encounter (Signed)
PA approved, pharmacy aware & will fill.

## 2016-09-18 NOTE — Telephone Encounter (Signed)
Pharmacy has both rx's. Topiramate ER 25 mg needed a PA, the PA has been approved, pharmacy will fill both rx's for patient.

## 2016-09-18 NOTE — Telephone Encounter (Signed)
[name] please resend both medication to pharm per pt pharm only received one and she does not know whether one. Pt has not pick up medication

## 2016-09-20 ENCOUNTER — Telehealth: Payer: Self-pay | Admitting: Family Medicine

## 2016-09-20 NOTE — Telephone Encounter (Signed)
Pt request refill  valACYclovir (VALTREX) 1000 MG tablet  [facility] [address]

## 2016-09-20 NOTE — Telephone Encounter (Signed)
Okay to refill? 

## 2016-09-21 ENCOUNTER — Other Ambulatory Visit: Payer: Self-pay | Admitting: Family Medicine

## 2016-09-21 DIAGNOSIS — B001 Herpesviral vesicular dermatitis: Secondary | ICD-10-CM | POA: Insufficient documentation

## 2016-09-21 MED ORDER — VALACYCLOVIR HCL 1 G PO TABS: ORAL_TABLET | ORAL | 1 refills | Status: DC

## 2016-09-21 NOTE — Telephone Encounter (Signed)
Continue Valtrex 2 g po as needed upon acute onset and repeat in 12 hours (4 tabs per treatment) Rx for Valtrex was sent.   Thanks, BJ

## 2016-10-03 ENCOUNTER — Ambulatory Visit: Payer: BLUE CROSS/BLUE SHIELD | Admitting: Family Medicine

## 2016-10-04 ENCOUNTER — Ambulatory Visit: Payer: BLUE CROSS/BLUE SHIELD | Admitting: Family Medicine

## 2016-10-16 ENCOUNTER — Other Ambulatory Visit: Payer: Self-pay | Admitting: Family Medicine

## 2016-10-17 ENCOUNTER — Encounter: Payer: Self-pay | Admitting: Family Medicine

## 2016-10-17 ENCOUNTER — Ambulatory Visit (INDEPENDENT_AMBULATORY_CARE_PROVIDER_SITE_OTHER): Payer: BLUE CROSS/BLUE SHIELD | Admitting: Family Medicine

## 2016-10-17 VITALS — BP 122/80 | HR 75 | Resp 12 | Ht 62.0 in | Wt 155.1 lb

## 2016-10-17 DIAGNOSIS — E663 Overweight: Secondary | ICD-10-CM | POA: Diagnosis not present

## 2016-10-17 MED ORDER — TOPIRAMATE ER 25 MG PO CAP24: 1.0000 | ORAL_CAPSULE | Freq: Every day | ORAL | 1 refills | Status: DC

## 2016-10-17 MED ORDER — PHENTERMINE HCL 15 MG PO CAPS: 15.0000 mg | ORAL_CAPSULE | ORAL | 1 refills | Status: DC

## 2016-10-17 NOTE — Progress Notes (Signed)
Pre visit review using our clinic review tool, if applicable. No additional management support is needed unless otherwise documented below in the visit note. 

## 2016-10-17 NOTE — Progress Notes (Signed)
HPI:   [name] is a 66 y.o. female, who is here today to follow on recent OV.   She was seen on 09/05/16, when she recommended pharmacologic treatment for weight loss. Qsymia was prescribed upon request but she could not afford medication, so change to Topamax ER 25 mg and Phentermine 15 mg daily. She started medications 28 days ago, she is reporting cold tolerance, she denies any significant side effects. She noticed some  tingling and fingertips left hand, resolved after a few minutes and has not been recurrent. She denies mood changes.  She also made some dietary changes, increased vegetable and fruit intake. She has not been able to exercise because of weather but planning on doing so.  Hx of HTN, home BP 120's/80's.  -Last office visit mammogram was ordered. She has not had it done and wonders if she still needs mammogram given the fact she had a negative mammogram in 2015. Also inquiring about Prevnar 13 vaccine.   No new concerns today.   Review of Systems  Constitutional: Negative for appetite change, fatigue and fever.  Eyes: Negative for redness and visual disturbance.  Respiratory: Negative for cough, shortness of breath and wheezing.   Cardiovascular: Negative for chest pain, palpitations and leg swelling.  Gastrointestinal: Negative for abdominal pain, nausea and vomiting.       Negative for changes in bowel habits.  Skin: Negative for rash.  Neurological: Negative for syncope, weakness and headaches.  Psychiatric/Behavioral: Negative for confusion. The patient is nervous/anxious.       Current Outpatient Prescriptions on File Prior to Visit  Medication Sig Dispense Refill  . amLODipine (NORVASC) 5 MG tablet Take 1 tablet (5 mg total) by mouth daily. 90 tablet 1  . Calcium Citrate-Vitamin D (CALCIUM + D PO) Take 600 mg by mouth 2 (two) times daily.    . carvedilol (COREG) 3.125 MG tablet Take 1 tablet (3.125 mg total) by mouth 2 (two) times  daily with a meal. 180 tablet 1  . Docusate Sodium (COLACE PO) Take 100 mg by mouth daily as needed (to prevent hard stools).     Marland Kitchen docusate sodium (COLACE) 100 MG capsule Take 1 capsule (100 mg total) by mouth 2 (two) times daily. 30 capsule 0  . loratadine (CLARITIN) 10 MG tablet Take 10 mg by mouth daily as needed for allergies.    . valACYclovir (VALTREX) 1000 MG tablet TAKE 2 TABLET BY MOUTH as needed upon acute onset and asap and repeat dose in 12 hours. 16 tablet 1   No current facility-administered medications on file prior to visit.      Past Medical History:  Diagnosis Date  . Allergy   . Diverticulitis   . Hypertension   . LBBB (left bundle branch block)   . Migraine    history   Allergies  Allergen Reactions  . Seasonal Ic [Cholestatin] Other (See Comments)    Upper resp, sore throat, sneezing, etc.  . Tape Other (See Comments)    Tape causes some redness & causes BLISTERS if left on for too long  . Latex Itching and Rash  . Penicillins Itching and Rash    Has patient had a PCN reaction causing immediate rash, facial/tongue/throat swelling, SOB or lightheadedness with hypotension: Yes Has patient had a PCN reaction causing severe rash involving mucus membranes or skin necrosis: No Has patient had a PCN reaction that required hospitalization No Has patient had a PCN reaction occurring within the  last 10 years: No If all of the above answers are "NO", then may proceed with Cephalosporin use.     Social History   Social History  . Marital status: Single    Spouse name: N/A  . Number of children: N/A  . Years of education: N/A   Occupational History  . 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 Smoking status: Never Smoker  . Smokeless tobacco: Never Used  . Alcohol use No  . Drug use: No  . Sexual activity: No   Other Topics Concern  . None   Social History Narrative  . None    Vitals:   10/17/16 1017  BP: 122/80    Pulse: 75  Resp: 12   Body mass index is 28.37 kg/m.  Wt Readings from Last 3 Encounters:  10/17/16 155 lb 2 oz (70.4 kg)  09/05/16 164 lb 1 oz (74.4 kg)  04/10/16 165 lb 6.4 oz (75 kg)    Physical Exam  Nursing note and vitals reviewed. Constitutional: She is oriented to person, place, and time. She appears well-developed. No distress.  HENT:  Head: Atraumatic.  Mouth/Throat: Oropharynx is clear and moist and mucous membranes are normal.  Eyes: Conjunctivae and EOM are normal.  Cardiovascular: Normal rate and regular rhythm.   No murmur heard. Respiratory: Effort normal and breath sounds normal. No respiratory distress.  GI: Soft. She exhibits no mass. There is no hepatomegaly. There is no tenderness.  Musculoskeletal: She exhibits no edema or tenderness.  Neurological: She is alert and oriented to person, place, and time. She has normal strength. Coordination and gait normal.  Skin: Skin is warm. No erythema.  Psychiatric: She has a normal mood and affect.  Well groomed, good eye contact.      ASSESSMENT AND PLAN:     Cyrenity was seen today for follow-up.  Diagnoses and all orders for this visit:  Overweight (BMI 25.0-29.9) -     Topiramate ER 25 MG CP24; Take 1 tablet by mouth daily. -     phentermine 15 MG capsule; Take 1 capsule (15 mg total) by mouth every morning.  She lost about 9-10 pounds since started on mediation and dietary changes. BMI from 30 to 28. We discussed natural hx of obesity, plateau and wt gain could happen. We discussed some healthier dietary choices, calorie count, and food labels. Recommend engaging in regular physical activity. We also review some of medication side effects. Continue monitoring BP at home. We plan on continuing medications for 2 more months and start weaning them off.  Follow-up in 2 months.   -In regard to mammogram, we discussed current recommendations for breast cancer screening. -Prevnar 13 can be given next  office visit, when she will be 80.      Oveta Idris G. [name], MD  [facility]. [place] office.

## 2016-10-17 NOTE — Patient Instructions (Signed)
A few things to remember from today's visit:   Overweight (BMI 25.0-29.9) - Plan: Topiramate ER 25 MG CP24, phentermine 15 MG capsule  Doing well with current dose, so no changes.  What are some tips for weight loss? People become overweight for many reasons. Weight issues can run in families. They can be caused by unhealthy behaviors and a person's environment. Certain health problems and medicines can also lead to weight gain. There are some simple things you can do to reach and maintain a healthy weight:  Eat small more frequent healthy meals instead 3 bid meals. Also Weight Watchers is a good option. Avoid sweet drinks. These include regular soft drinks, fruit juices, fruit drinks, energy drinks, sweetened iced tea, and flavored milk. Avoid fast foods. Fast foods such as french fries, hamburgers, chicken nuggets, and pizza are high in calories and can cause weight gain. Eat a healthy breakfast. People who skip breakfast tend to weigh more. Don't watch more than two hours of television per day. Chew sugar-free gum between meals to cut down on snacking. Avoid grocery shopping when you're hungry. Pack a healthy lunch instead of eating out to control what and how much you eat. Eat a lot of fruits and vegetables. Aim for about 2 cups of fruit and 2 to 3 cups of vegetables per day. Aim for 150 minutes per week of moderate-intensity exercise (such as brisk walking), or 75 minutes per week of vigorous exercise (such as jogging or running). OR 15-30 min of daily brisk walking. Be more active. Small changes in physical activity can easily be added to your daily routine. For example, take the stairs instead of the elevator. Take a walk with your family. A daily walk is a great way to get exercise and to catch up on the day's events.    Please be sure medication list is accurate. If a new problem present, please set up appointment sooner than planned today.

## 2016-11-15 ENCOUNTER — Ambulatory Visit
Admission: RE | Admit: 2016-11-15 | Discharge: 2016-11-15 | Disposition: A | Discharge status: Home / Self Care | Payer: Medicare Other | Source: Ambulatory Visit | Attending: Family Medicine | Admitting: Family Medicine

## 2016-11-15 DIAGNOSIS — Z1239 Encounter for other screening for malignant neoplasm of breast: Secondary | ICD-10-CM

## 2016-11-15 DIAGNOSIS — Z1231 Encounter for screening mammogram for malignant neoplasm of breast: Secondary | ICD-10-CM | POA: Diagnosis not present

## 2016-12-17 NOTE — Progress Notes (Signed)
HPI:   Cheryl Barnes is a 66 y.o. female, who is here today to follow on wt loss and HTN.   She was seen on 10/17/16.  She was seen on 09/05/16, when she recommended pharmacologic treatment for weight loss. She was on Topamax ER 25 mg and Phentermine 15 mg daily, she had to wean medication and last taken 11/22/16 due to new insurance not covering medication. She is concerned because she has not been able to lose more weight but she has not gained weight since medication was discontinued either.   She is still following a healthy diet. Exercising:She is not doing so due to weather.    HTN:  Currently she is on Amlodipine 5 mg and Coreg 3.125 mg bid. Home BP's 115-120/80.  Denies severe/frequent headache, visual changes, chest pain, dyspnea, palpitation, claudication, focal weakness, or edema.  Lab Results  Component Value Date   CREATININE 0.66 08/29/2016   BUN 19 08/29/2016   NA 140 08/29/2016   K 4.1 08/29/2016   CL 104 08/29/2016   CO2 28 08/29/2016       Review of Systems  Constitutional: Negative for activity change, appetite change and fatigue.  HENT: Negative for mouth sores and nosebleeds.   Eyes: Negative for redness and visual disturbance.  Respiratory: Negative for cough, shortness of breath and wheezing.   Cardiovascular: Negative for chest pain, palpitations and leg swelling.  Gastrointestinal: Negative for abdominal pain, nausea and vomiting.       Negative for changes in bowel habits.  Genitourinary: Negative for decreased urine volume and hematuria.  Neurological: Negative for syncope, weakness and headaches.  Psychiatric/Behavioral: Negative for confusion. The patient is nervous/anxious.       Current Outpatient Prescriptions on File Prior to Visit  Medication Sig Dispense Refill  . amLODipine (NORVASC) 5 MG tablet Take 1 tablet (5 mg total) by mouth daily. 90 tablet 1  . Calcium Citrate-Vitamin D (CALCIUM + D PO) Take 600 mg by  mouth 2 (two) times daily.    . carvedilol (COREG) 3.125 MG tablet Take 1 tablet (3.125 mg total) by mouth 2 (two) times daily with a meal. 180 tablet 1  . Docusate Sodium (COLACE PO) Take 100 mg by mouth daily as needed (to prevent hard stools).     Marland Kitchen docusate sodium (COLACE) 100 MG capsule Take 1 capsule (100 mg total) by mouth 2 (two) times daily. 30 capsule 0  . loratadine (CLARITIN) 10 MG tablet Take 10 mg by mouth daily as needed for allergies.    . valACYclovir (VALTREX) 1000 MG tablet TAKE 2 TABLET BY MOUTH as needed upon acute onset and asap and repeat dose in 12 hours. 16 tablet 1   No current facility-administered medications on file prior to visit.      Past Medical History:  Diagnosis Date  . Allergy   . Diverticulitis   . Hypertension   . LBBB (left bundle branch block)   . Migraine    history   Allergies  Allergen Reactions  . Seasonal Ic [Cholestatin] Other (See Comments)    Upper resp, sore throat, sneezing, etc.  . Tape Other (See Comments)    Tape causes some redness & causes BLISTERS if left on for too long  . Latex Itching and Rash  . Penicillins Itching and Rash    Has patient had a PCN reaction causing immediate rash, facial/tongue/throat swelling, SOB or lightheadedness with hypotension: Yes Has patient had a PCN reaction causing  severe rash involving mucus membranes or skin necrosis: No Has patient had a PCN reaction that required hospitalization No Has patient had a PCN reaction occurring within the last 10 years: No If all of the above answers are "NO", then may proceed with Cephalosporin use.     Social History   Social History  . Marital status: Single    Spouse name: N/A  . Number of children: N/A  . Years of education: N/A   Occupational History  . ophthalmology @ [employer]  . Smoking status: Never Smoker  . Smokeless tobacco: Never Used  . Alcohol use No  . Drug use: No  . Sexual activity: No     Other Topics Concern  . None   Social History Narrative  . None    Vitals:   12/18/16 0932  BP: 120/80  Pulse: 63  Resp: 12   Body mass index is 27.53 kg/m.   Wt Readings from Last 3 Encounters:  12/18/16 150 lb 8 oz (68.3 kg)  10/17/16 155 lb 2 oz (70.4 kg)  09/05/16 164 lb 1 oz (74.4 kg)    Physical Exam  Nursing note and vitals reviewed. Constitutional: She is oriented to person, place, and time. She appears well-developed. No distress.  HENT:  Head: Atraumatic.  Mouth/Throat: Oropharynx is clear and moist and mucous membranes are normal.  Eyes: Conjunctivae and EOM are normal. Pupils are equal, round, and reactive to light.  Cardiovascular: Normal rate and regular rhythm.   No murmur heard. Respiratory: Effort normal and breath sounds normal. No respiratory distress.  GI: Soft. She exhibits no mass. There is no hepatomegaly. There is no tenderness.  Musculoskeletal: She exhibits edema (trace pitting edema LE bilateral.).  Lymphadenopathy:    She has no cervical adenopathy.  Neurological: She is alert and oriented to person, place, and time. She has normal strength. Gait normal.  Skin: Skin is warm. No erythema.  Psychiatric: Her mood appears anxious.  Well groomed, good eye contact.     ASSESSMENT AND PLAN:   Anelle was seen today for follow-up.  Diagnoses and all orders for this visit:  Essential hypertension  Adequately controlled. No changes in current management. DASH-low salt diet recommended. Eye exam recommended annually. F/U in 08/2017 , before if needed.  BMI 27.0-27.9,adult  She has lost about 14 pounds since medication was started, 08/2016. BMI down from 30 to 27. She would like to resume medication, at this time I do not recommend doing so. Continue following a healthy diet, explained natural history of obesity, not unusual to reach a plateau. She has not gained wt despite of stopping medication almost a month ago.  Recommend  engaging in regular physical activity.  Need for vaccination with 13-polyvalent pneumococcal conjugate vaccine -     Pneumococcal conjugate vaccine 13-valent      Kree Rafter G. [name], MD  [facility]. [place] office.

## 2016-12-18 ENCOUNTER — Ambulatory Visit (INDEPENDENT_AMBULATORY_CARE_PROVIDER_SITE_OTHER): Payer: Medicare Other | Admitting: Family Medicine

## 2016-12-18 ENCOUNTER — Encounter: Payer: Self-pay | Admitting: Family Medicine

## 2016-12-18 VITALS — BP 120/80 | HR 63 | Resp 12 | Ht 62.0 in | Wt 150.5 lb

## 2016-12-18 DIAGNOSIS — Z6827 Body mass index (BMI) 27.0-27.9, adult: Secondary | ICD-10-CM

## 2016-12-18 DIAGNOSIS — I10 Essential (primary) hypertension: Secondary | ICD-10-CM | POA: Diagnosis not present

## 2016-12-18 DIAGNOSIS — Z23 Encounter for immunization: Secondary | ICD-10-CM

## 2016-12-18 NOTE — Patient Instructions (Signed)
A few things to remember from today's visit:   Essential hypertension  BMI 27.0-27.9,adult  At this time continue healthy diet and try to engage in regular physical activity.  Monitor blood pressure at home.  DASH diet recommended: high in vegetables, fruits, low-fat dairy products, whole grains, poultry, fish, and nuts; and low in sweets, sugar-sweetened beverages, and red meats.   Please be sure medication list is accurate. If a new problem present, please set up appointment sooner than planned today.

## 2016-12-18 NOTE — Progress Notes (Signed)
Pre visit review using our clinic review tool, if applicable. No additional management support is needed unless otherwise documented below in the visit note. 

## 2016-12-20 ENCOUNTER — Ambulatory Visit: Payer: Medicare Other | Admitting: Family Medicine

## 2017-01-01 ENCOUNTER — Other Ambulatory Visit: Payer: Self-pay | Admitting: Family Medicine

## 2017-01-01 DIAGNOSIS — B001 Herpesviral vesicular dermatitis: Secondary | ICD-10-CM

## 2017-01-24 ENCOUNTER — Encounter (HOSPITAL_COMMUNITY): Payer: Self-pay | Admitting: Emergency Medicine

## 2017-01-24 ENCOUNTER — Emergency Department (HOSPITAL_COMMUNITY): Payer: Medicare Other

## 2017-01-24 ENCOUNTER — Inpatient Hospital Stay (HOSPITAL_COMMUNITY)
Admission: EM | Admit: 2017-01-24 | Discharge: 2017-01-27 | DRG: 392 | Disposition: A | Discharge status: Home / Self Care | Payer: Medicare Other | Attending: Internal Medicine | Admitting: Internal Medicine

## 2017-01-24 DIAGNOSIS — Z9049 Acquired absence of other specified parts of digestive tract: Secondary | ICD-10-CM | POA: Diagnosis not present

## 2017-01-24 DIAGNOSIS — Z888 Allergy status to other drugs, medicaments and biological substances status: Secondary | ICD-10-CM

## 2017-01-24 DIAGNOSIS — R079 Chest pain, unspecified: Secondary | ICD-10-CM

## 2017-01-24 DIAGNOSIS — Z91040 Latex allergy status: Secondary | ICD-10-CM | POA: Diagnosis not present

## 2017-01-24 DIAGNOSIS — K5732 Diverticulitis of large intestine without perforation or abscess without bleeding: Secondary | ICD-10-CM | POA: Diagnosis present

## 2017-01-24 DIAGNOSIS — Z880 Allergy status to penicillin: Secondary | ICD-10-CM | POA: Diagnosis not present

## 2017-01-24 DIAGNOSIS — D696 Thrombocytopenia, unspecified: Secondary | ICD-10-CM | POA: Diagnosis present

## 2017-01-24 DIAGNOSIS — R32 Unspecified urinary incontinence: Secondary | ICD-10-CM | POA: Diagnosis present

## 2017-01-24 DIAGNOSIS — E871 Hypo-osmolality and hyponatremia: Secondary | ICD-10-CM | POA: Diagnosis present

## 2017-01-24 DIAGNOSIS — I10 Essential (primary) hypertension: Secondary | ICD-10-CM | POA: Diagnosis present

## 2017-01-24 DIAGNOSIS — M899 Disorder of bone, unspecified: Secondary | ICD-10-CM | POA: Diagnosis present

## 2017-01-24 DIAGNOSIS — K5792 Diverticulitis of intestine, part unspecified, without perforation or abscess without bleeding: Secondary | ICD-10-CM

## 2017-01-24 DIAGNOSIS — I25119 Atherosclerotic heart disease of native coronary artery with unspecified angina pectoris: Secondary | ICD-10-CM | POA: Diagnosis present

## 2017-01-24 DIAGNOSIS — E041 Nontoxic single thyroid nodule: Secondary | ICD-10-CM

## 2017-01-24 DIAGNOSIS — Z91048 Other nonmedicinal substance allergy status: Secondary | ICD-10-CM | POA: Diagnosis not present

## 2017-01-24 DIAGNOSIS — E860 Dehydration: Secondary | ICD-10-CM | POA: Diagnosis present

## 2017-01-24 DIAGNOSIS — I447 Left bundle-branch block, unspecified: Secondary | ICD-10-CM | POA: Diagnosis present

## 2017-01-24 DIAGNOSIS — R0789 Other chest pain: Secondary | ICD-10-CM | POA: Diagnosis not present

## 2017-01-24 DIAGNOSIS — R109 Unspecified abdominal pain: Secondary | ICD-10-CM | POA: Diagnosis not present

## 2017-01-24 DIAGNOSIS — Z79899 Other long term (current) drug therapy: Secondary | ICD-10-CM

## 2017-01-24 DIAGNOSIS — R0902 Hypoxemia: Secondary | ICD-10-CM | POA: Diagnosis not present

## 2017-01-24 LAB — COMPREHENSIVE METABOLIC PANEL
ALK PHOS: 94 U/L (ref 38–126)
ALT: 20 U/L (ref 14–54)
AST: 19 U/L (ref 15–41)
Albumin: 4.4 g/dL (ref 3.5–5.0)
Anion gap: 8 (ref 5–15)
BILIRUBIN TOTAL: 0.7 mg/dL (ref 0.3–1.2)
BUN: 17 mg/dL (ref 6–20)
CO2: 24 mmol/L (ref 22–32)
CREATININE: 0.77 mg/dL (ref 0.44–1.00)
Calcium: 9.2 mg/dL (ref 8.9–10.3)
Chloride: 108 mmol/L (ref 101–111)
GFR calc Af Amer: >60 mL/min (ref >60)
GLUCOSE: 102 mg/dL — AB (ref 65–99)
Potassium: 3.8 mmol/L (ref 3.5–5.1)
Sodium: 140 mmol/L (ref 135–145)
TOTAL PROTEIN: 7.9 g/dL (ref 6.5–8.1)

## 2017-01-24 LAB — CBC
HEMATOCRIT: 41.3 % (ref 36.0–46.0)
Hemoglobin: 13.9 g/dL (ref 12.0–15.0)
MCH: 30.6 pg (ref 26.0–34.0)
MCHC: 33.7 g/dL (ref 30.0–36.0)
MCV: 91 fL (ref 78.0–100.0)
Platelets: 160 10*3/uL (ref 150–400)
RBC: 4.54 MIL/uL (ref 3.87–5.11)
RDW: 13.2 % (ref 11.5–15.5)
WBC: 10.2 10*3/uL (ref 4.0–10.5)

## 2017-01-24 LAB — URINALYSIS, ROUTINE W REFLEX MICROSCOPIC
BILIRUBIN URINE: NEGATIVE
GLUCOSE, UA: NEGATIVE mg/dL
HGB URINE DIPSTICK: NEGATIVE
Ketones, ur: 5 mg/dL — AB
Leukocytes, UA: NEGATIVE
Nitrite: NEGATIVE
PROTEIN: NEGATIVE mg/dL
Specific Gravity, Urine: 1.044 — ABNORMAL HIGH (ref 1.005–1.030)
pH: 6 (ref 5.0–8.0)

## 2017-01-24 LAB — I-STAT TROPONIN, ED: Troponin i, poc: 0 ng/mL (ref 0.00–0.08)

## 2017-01-24 MED ORDER — ONDANSETRON HCL 4 MG PO TABS: 4.0000 mg | ORAL_TABLET | Freq: Four times a day (QID) | ORAL | Status: DC | PRN

## 2017-01-24 MED ORDER — DOCUSATE SODIUM 100 MG PO CAPS
100.0000 mg | ORAL_CAPSULE | Freq: Every day | ORAL | Status: DC | PRN
Start: 2017-01-24 — End: 2017-01-26

## 2017-01-24 MED ORDER — ENOXAPARIN SODIUM 40 MG/0.4ML ~~LOC~~ SOLN
40.0000 mg | SUBCUTANEOUS | Status: DC
  Administered 2017-01-25 – 2017-01-26 (×2): 40 mg via SUBCUTANEOUS
  Filled 2017-01-24 (×2): qty 0.4

## 2017-01-24 MED ORDER — ACETAMINOPHEN 325 MG PO TABS
650.0000 mg | ORAL_TABLET | Freq: Four times a day (QID) | ORAL | Status: DC | PRN
  Administered 2017-01-26: 650 mg via ORAL
  Filled 2017-01-24: qty 2

## 2017-01-24 MED ORDER — CARVEDILOL 3.125 MG PO TABS
3.1250 mg | ORAL_TABLET | Freq: Two times a day (BID) | ORAL | Status: DC
  Administered 2017-01-25 – 2017-01-27 (×5): 3.125 mg via ORAL
  Filled 2017-01-24 (×5): qty 1

## 2017-01-24 MED ORDER — AMLODIPINE BESYLATE 5 MG PO TABS
5.0000 mg | ORAL_TABLET | Freq: Every day | ORAL | Status: DC
  Administered 2017-01-25 – 2017-01-27 (×3): 5 mg via ORAL
  Filled 2017-01-24 (×3): qty 1

## 2017-01-24 MED ORDER — ONDANSETRON HCL 4 MG/2ML IJ SOLN
4.0000 mg | Freq: Once | INTRAMUSCULAR | Status: AC
  Administered 2017-01-24: 4 mg via INTRAVENOUS
  Filled 2017-01-24: qty 2

## 2017-01-24 MED ORDER — HYDROMORPHONE HCL 1 MG/ML IJ SOLN
1.0000 mg | Freq: Once | INTRAMUSCULAR | Status: AC
  Administered 2017-01-24: 1 mg via INTRAVENOUS
  Filled 2017-01-24: qty 1

## 2017-01-24 MED ORDER — DOCUSATE SODIUM 100 MG PO CAPS
100.0000 mg | ORAL_CAPSULE | Freq: Two times a day (BID) | ORAL | Status: DC
  Administered 2017-01-25 (×2): 100 mg via ORAL
  Filled 2017-01-24 (×2): qty 1

## 2017-01-24 MED ORDER — SODIUM CHLORIDE 0.9 % IV BOLUS (SEPSIS)
500.0000 mL | Freq: Once | INTRAVENOUS | Status: AC
  Administered 2017-01-24: 500 mL via INTRAVENOUS

## 2017-01-24 MED ORDER — IOPAMIDOL (ISOVUE-300) INJECTION 61%
30.0000 mL | Freq: Once | INTRAVENOUS | Status: AC | PRN
  Administered 2017-01-24: 30 mL via ORAL

## 2017-01-24 MED ORDER — ACETAMINOPHEN 650 MG RE SUPP: 650.0000 mg | Freq: Four times a day (QID) | RECTAL | Status: DC | PRN

## 2017-01-24 MED ORDER — CIPROFLOXACIN IN D5W 400 MG/200ML IV SOLN
400.0000 mg | Freq: Two times a day (BID) | INTRAVENOUS | Status: DC
  Administered 2017-01-25 – 2017-01-27 (×5): 400 mg via INTRAVENOUS
  Filled 2017-01-24 (×5): qty 200

## 2017-01-24 MED ORDER — CALCIUM CARBONATE-VITAMIN D 500-200 MG-UNIT PO TABS
1.0000 | ORAL_TABLET | Freq: Two times a day (BID) | ORAL | Status: DC
  Administered 2017-01-25 – 2017-01-27 (×5): 1 via ORAL
  Filled 2017-01-24 (×5): qty 1

## 2017-01-24 MED ORDER — LORATADINE 10 MG PO TABS
10.0000 mg | ORAL_TABLET | Freq: Every day | ORAL | Status: DC
  Administered 2017-01-25 – 2017-01-27 (×3): 10 mg via ORAL
  Filled 2017-01-24 (×3): qty 1

## 2017-01-24 MED ORDER — CIPROFLOXACIN IN D5W 400 MG/200ML IV SOLN
400.0000 mg | Freq: Once | INTRAVENOUS | Status: AC
  Administered 2017-01-24: 400 mg via INTRAVENOUS
  Filled 2017-01-24: qty 200

## 2017-01-24 MED ORDER — METRONIDAZOLE 500 MG PO TABS
500.0000 mg | ORAL_TABLET | Freq: Once | ORAL | Status: AC
  Administered 2017-01-24: 500 mg via ORAL
  Filled 2017-01-24: qty 1

## 2017-01-24 MED ORDER — HYDROMORPHONE HCL 1 MG/ML IJ SOLN
0.5000 mg | INTRAMUSCULAR | Status: DC | PRN
  Administered 2017-01-25: 0.5 mg via INTRAVENOUS
  Filled 2017-01-24: qty 0.5

## 2017-01-24 MED ORDER — POTASSIUM CHLORIDE IN NACL 40-0.9 MEQ/L-% IV SOLN
INTRAVENOUS | Status: DC
  Administered 2017-01-25 – 2017-01-27 (×4): 125 mL/h via INTRAVENOUS
  Filled 2017-01-24 (×7): qty 1000

## 2017-01-24 MED ORDER — MORPHINE SULFATE (PF) 2 MG/ML IV SOLN
4.0000 mg | INTRAVENOUS | Status: DC | PRN
  Administered 2017-01-24: 4 mg via INTRAVENOUS
  Filled 2017-01-24: qty 2

## 2017-01-24 MED ORDER — ONDANSETRON HCL 4 MG/2ML IJ SOLN
4.0000 mg | Freq: Four times a day (QID) | INTRAMUSCULAR | Status: DC | PRN
  Administered 2017-01-25: 4 mg via INTRAVENOUS
  Filled 2017-01-24: qty 2

## 2017-01-24 MED ORDER — IOPAMIDOL (ISOVUE-300) INJECTION 61%
INTRAVENOUS | Status: AC
  Filled 2017-01-24: qty 30

## 2017-01-24 MED ORDER — IOPAMIDOL (ISOVUE-300) INJECTION 61%
INTRAVENOUS | Status: AC
  Filled 2017-01-24: qty 100

## 2017-01-24 MED ORDER — IOPAMIDOL (ISOVUE-300) INJECTION 61%
100.0000 mL | Freq: Once | INTRAVENOUS | Status: AC | PRN
  Administered 2017-01-24: 100 mL via INTRAVENOUS

## 2017-01-24 MED ORDER — METRONIDAZOLE IN NACL 5-0.79 MG/ML-% IV SOLN
500.0000 mg | Freq: Three times a day (TID) | INTRAVENOUS | Status: DC
  Administered 2017-01-25 – 2017-01-27 (×7): 500 mg via INTRAVENOUS
  Filled 2017-01-24 (×7): qty 100

## 2017-01-24 MED ORDER — SODIUM CHLORIDE 0.9 % IV SOLN
Freq: Once | INTRAVENOUS | Status: AC
  Administered 2017-01-24: 21:00:00 via INTRAVENOUS

## 2017-01-24 MED ORDER — OXYCODONE HCL 5 MG PO TABS
5.0000 mg | ORAL_TABLET | ORAL | Status: DC | PRN
  Administered 2017-01-25 – 2017-01-26 (×2): 5 mg via ORAL
  Filled 2017-01-24 (×2): qty 1

## 2017-01-24 NOTE — ED Notes (Signed)
Pt still unable to give urine sample

## 2017-01-24 NOTE — ED Notes (Signed)
Pt was able to give urine sample, but water got into the sample by accident.

## 2017-01-24 NOTE — H&P (Signed)
History and Physical    [name]:[id number] DOB: 12-Jan-1951 DOA: 01/24/2017  PCP: [name], [name], MD (Confirm with patient/family/NH records and if not entered, this has to be entered at [facility] point of entry) Patient coming from: home  I have personally briefly reviewed patient's old medical records in [place]  Chief Complaint: abdominal pain for 16 hours  HPI: Cheryl Barnes is a 66 y.o. female with medical history significant of diverticulosis, essential hypertension, left bundle branch block, and urinary incontinence who presents with severe abdominal pain which began in the early hours of this morning.  She also has some tightness in her chest.  Symptoms started late last early this morning with sharp left lower quadrant abdominal pain. She states this was all too familiar to her from her past episodes of diverticulitis. It worsened over the course of the day and "got bad much worse than it had gotten before".  Nothing made it better movement made it worse. Palpation of the abdomen made it worse as well. Does not radiate.  She started developing some pain in her anterior chest rating to her anterior bilateral shoulders described as a pressure. She was not short of breath. She was not nauseated. She did not describe reflux symptoms.  When I saw the patient her oxygen level was dropping into the mid to low 80 percentile on room air. And she continued to complain of chest tightness.  With regards to her chest pain she followed up with Dr. Gilles Chiquito and underwent a stress test in the office and had other noninvasive testing which was unremarkable.   ED Course: Patient underwent CT scanning of the abdomen which revealed diverticulitis without any abscess. She continued to have chest pain which was relieved after she received IV morphine. Her abdominal pain persisted and she required IV dialogue. She stated this pain is worse than it has been in the past.    Review of  Systems: As per HPI otherwise 10 point review of systems negative.  Unacceptable ROS statements: "10 systems reviewed," "Extensive" (without elaboration).  Acceptable ROS statements: "All others negative," "All others reviewed and are negative," and "All others unremarkable," with at [place] documented Can't double dip - if using for HPI can't use for ROS Review of Systems  Constitutional: Negative for chills and fever.  HENT: Negative for ear discharge and ear pain.   Eyes: Negative for blurred vision and double vision.  Respiratory: Negative for cough and hemoptysis.   Cardiovascular: Positive for chest pain. Negative for claudication and leg swelling.  Gastrointestinal: Positive for abdominal pain. Negative for heartburn and nausea.  Genitourinary: Negative for frequency and urgency.  Musculoskeletal: Negative for myalgias and neck pain.  Skin: Negative for itching and rash.  Neurological: Negative for dizziness and headaches.  Endo/Heme/Allergies: Negative for environmental allergies. Does not bruise/bleed easily.  Psychiatric/Behavioral: Negative for depression and suicidal ideas.   Past Medical History:  Diagnosis Date  . Allergy   . Diverticulitis   . Hypertension   . LBBB (left bundle branch block)   . Migraine    history    Past Surgical History:  Procedure Laterality Date  . CESAREAN SECTION    . CHOLECYSTECTOMY    . FRACTURE SURGERY    . PARTIAL HYSTERECTOMY     Abdominal  . ROTATOR CUFF REPAIR Left      reports that she has never smoked. She has never used smokeless tobacco. She reports that she does not drink  alcohol or use drugs.  Allergies  Allergen Reactions  . Seasonal Ic [Cholestatin] Other (See Comments)    Upper resp, sore throat, sneezing, etc.  . Tape Other (See Comments)    Tape causes some redness & causes BLISTERS if left on for too long  . Latex Itching and Rash  . Penicillins Itching and Rash    Has patient had a PCN reaction causing  immediate rash, facial/tongue/throat swelling, SOB or lightheadedness with hypotension: Yes Has patient had a PCN reaction causing severe rash involving mucus membranes or skin necrosis: No Has patient had a PCN reaction that required hospitalization No Has patient had a PCN reaction occurring within the last 10 years: No If all of the above answers are "NO", then may proceed with Cephalosporin use.     Family History  Problem Relation Age of Onset  . Hypertension Father   . Diabetes Father        adult onset  . Colon cancer Father   . Hypertension Mother   . Breast cancer Mother        breast  . Cancer Mother        breast  . Asthma Maternal Grandfather   . Hypertension Brother   . Diabetes Brother    Unacceptable: Noncontributory, unremarkable, or negative. Acceptable: Family history reviewed and not pertinent (If you reviewed it)  Prior to Admission medications   Medication Sig Start Date End Date Taking? Authorizing Provider  amLODipine (NORVASC) 5 MG tablet Take 1 tablet (5 mg total) by mouth daily. 09/10/16  Yes [name], [name], MD  Calcium Citrate-Vitamin D (CALCIUM + D PO) Take 600 mg by mouth 2 (two) times daily.   Yes [provider]  carvedilol (COREG) 3.125 MG tablet Take 1 tablet (3.125 mg total) by mouth 2 (two) times daily with a meal. 09/10/16  Yes [name], [name], MD  docusate sodium (COLACE) 100 MG capsule Take 1 capsule (100 mg total) by mouth 2 (two) times daily. Patient taking differently: Take 100 mg by mouth daily.  05/08/16  Yes Haney, Alyssa A, MD  loratadine (CLARITIN) 10 MG tablet Take 10 mg by mouth daily.    Yes [provider]  Docusate Sodium (COLACE PO) Take 100 mg by mouth daily as needed (to prevent hard stools).     [provider]  valACYclovir (VALTREX) 1000 MG tablet TAKE 2 TABLETS BY MOUTH AS NEEDED UPON ACUTE ONSET AND ASAP AND REPEAT DOSE IN 12 HOURS 01/02/17   [name], [name], MD    Physical Exam: Vitals:    01/24/17 2028 01/24/17 2030 01/24/17 2234 01/24/17 2324  BP: (!) 146/77 (!) 141/74 113/73 111/70  Pulse: 79 81 80 76  Resp: 19 20 14 12   Temp:    97.9 F (36.6 C)  TempSrc:    Oral  SpO2: 92% 93% 95% 96%  Weight:      Height:        Constitutional: NAD, calm, comfortable Vitals:   01/24/17 2028 01/24/17 2030 01/24/17 2234 01/24/17 2324  BP: (!) 146/77 (!) 141/74 113/73 111/70  Pulse: 79 81 80 76  Resp: 19 20 14 12   Temp:    97.9 F (36.6 C)  TempSrc:    Oral  SpO2: 92% 93% 95% 96%  Weight:      Height:       Eyes: PERRL, lids and conjunctivae normal ENMT: Mucous membranes are moist. Posterior pharynx clear of any exudate or lesions.Normal dentition.  Neck: normal, supple,  no masses, no thyromegaly Respiratory: clear to auscultation bilaterally, no wheezing, no crackles. Normal respiratory effort. No accessory muscle use.  Cardiovascular: Regular rate and rhythm, no murmurs / rubs / gallops. No extremity edema. 2+ pedal pulses. No carotid bruits.  Abdomen: Tenderness to palpation in the left lower quadrant with some guarding. No rebound, no masses palpated. No hepatosplenomegaly. Bowel sounds positive.  Musculoskeletal: no clubbing / cyanosis. No joint deformity upper and lower extremities. Good ROM, no contractures. Normal muscle tone.  Skin: no rashes, lesions, ulcers. No induration Neurologic: CN 2-12 grossly intact. Sensation intact, DTR normal. Strength 5/5 in all 4.  Psychiatric: Normal judgment and insight. Alert and oriented x 3. Normal mood.   (Anything < 9 systems with 2 bullets each down codes to level 1) (If patient refuses exam can't bill higher level) (Make sure to document decubitus ulcers present on admission -- if possible -- and whether patient has chronic indwelling catheter at time of admission)  Labs on Admission: I have personally reviewed following labs and imaging studies  CBC:  Recent Labs Lab 01/24/17 1900  WBC 10.2  HGB 13.9  HCT 41.3  MCV  91.0  PLT 921   Basic Metabolic Panel:  Recent Labs Lab 01/24/17 1900  NA 140  K 3.8  CL 108  CO2 24  GLUCOSE 102*  BUN 17  CREATININE 0.77  CALCIUM 9.2   GFR: Estimated Creatinine Clearance: 60.9 mL/min (by C-G formula based on SCr of 0.77 mg/dL). Liver Function Tests:  Recent Labs Lab 01/24/17 1900  AST 19  ALT 20  ALKPHOS 94  BILITOT 0.7  PROT 7.9  ALBUMIN 4.4   No results for input(s): LIPASE, AMYLASE in the last 168 hours. No results for input(s): AMMONIA in the last 168 hours. Coagulation Profile: No results for input(s): INR, PROTIME in the last 168 hours. Cardiac Enzymes: No results for input(s): CKTOTAL, CKMB, CKMBINDEX, TROPONINI in the last 168 hours. BNP (last 3 results) No results for input(s): PROBNP in the last 8760 hours. HbA1C: No results for input(s): HGBA1C in the last 72 hours. CBG: No results for input(s): GLUCAP in the last 168 hours. Lipid Profile: No results for input(s): CHOL, HDL, LDLCALC, TRIG, CHOLHDL, LDLDIRECT in the last 72 hours. Thyroid Function Tests: No results for input(s): TSH, T4TOTAL, FREET4, T3FREE, THYROIDAB in the last 72 hours. Anemia Panel: No results for input(s): VITAMINB12, FOLATE, FERRITIN, TIBC, IRON, RETICCTPCT in the last 72 hours. Urine analysis:    Component Value Date/Time   COLORURINE YELLOW 01/24/2017 1903   APPEARANCEUR CLEAR 01/24/2017 1903   LABSPEC 1.044 (H) 01/24/2017 1903   PHURINE 6.0 01/24/2017 1903   GLUCOSEU NEGATIVE 01/24/2017 1903   GLUCOSEU NEGATIVE 03/14/2007 0945   HGBUR NEGATIVE 01/24/2017 1903   HGBUR negative 03/10/2010 0819   BILIRUBINUR NEGATIVE 01/24/2017 1903   BILIRUBINUR n 08/29/2016 0942   KETONESUR 5 (A) 01/24/2017 1903   PROTEINUR NEGATIVE 01/24/2017 1903   UROBILINOGEN 0.2 08/29/2016 0942   UROBILINOGEN 0.2 12/18/2014 1708   NITRITE NEGATIVE 01/24/2017 1903   LEUKOCYTESUR NEGATIVE 01/24/2017 1903    Radiological Exams on Admission: Dg Chest 2 View  Result Date:  01/24/2017 CLINICAL DATA:  Chest pain extending down both arms.  DVT. EXAM: CHEST  2 VIEW COMPARISON:  06/09/2014. FINDINGS: Normal sized heart. Clear lungs with normal vascularity. Unremarkable bones. IMPRESSION: No acute abnormality. Electronically Signed   By: Claudie Revering M.D.   On: 01/24/2017 18:48   Ct Abdomen Pelvis W Contrast  Result Date:  01/24/2017 CLINICAL DATA:  Abdominal bloating and sharp pain EXAM: CT ABDOMEN AND PELVIS WITH CONTRAST TECHNIQUE: Multidetector CT imaging of the abdomen and pelvis was performed using the standard protocol following bolus administration of intravenous contrast. CONTRAST:  43mL ISOVUE-300 IOPAMIDOL (ISOVUE-300) INJECTION 61%, 164mL ISOVUE-300 IOPAMIDOL (ISOVUE-300) INJECTION 61% COMPARISON:  05/08/2016 FINDINGS: Lower chest: Lung bases demonstrate no acute consolidation or pleural effusion. Borderline cardiomegaly. Hepatobiliary: Minimal central intra hepatic biliary dilatation, unchanged. Surgical absence of the gallbladder. Pancreas: Unremarkable. No pancreatic ductal dilatation or surrounding inflammatory changes. Spleen: Normal in size without focal abnormality. Adrenals/Urinary Tract: Adrenal glands are within normal limits. 4.9 cm cyst upper pole left kidney. Bladder normal. Stomach/Bowel: The stomach is nonenlarged. No dilated small bowel. Normal appendix. Focal wall thickening and inflammatory change at the distal descending/ proximal sigmoid colon, consistent with acute diverticulitis. No abscess. Vascular/Lymphatic: Aortic atherosclerosis. Stable 1.5 cm splenic artery aneurysm. No enlarged lymph nodes Reproductive: Status post hysterectomy. No adnexal masses. Other: No free air or free fluid. Musculoskeletal: No acute or suspicious bone lesion. IMPRESSION: 1. Findings consistent with acute diverticulitis of the distal descending/proximal sigmoid colon. No perforation or abscess. 2. Stable 1.5 cm splenic artery aneurysm. Electronically Signed   By: Donavan Foil M.D.   On: 01/24/2017 21:24    EKG: Independently reviewed. And reveals left bundle branch block  Assessment/Plan Principal Problem:   Acute diverticulitis Active Problems:   Chest pain   Abdominal pain   Essential hypertension  (please populate well all problems here in Problem List. (For example, if patient is on BP meds at home and you resume or decide to hold them, it is a problem that needs to be her. Same for CAD, COPD, HLD and so on)   1. Acute diverticulitis: Patient will be started on ciprofloxacin and Flagyl. IV fluids will be administered as well as IV and oral pain medications made available to her. We'll keep her on clear liquids.  2. Essential hypertension: Continue home medications.  3. Abdominal pain: Due to acute diverticulitis continue with pain management measures as noted above.  4. Chest pain:  Patient with episodes of chest tightness while at home and tonight in the emergency department. When I saw her after she received some IV pain medications she was de-satting in the low 80s. Although this could be due to pain medications and may also be secondary to pulmonary embolism. I discussed with Dr. Jeneen Rinks from the emergency department who plans to order a CTA of her chest. For now will continue Lovenox at prophylactic doses.  DVT prophylaxis: lovenox Code Status:  Full  Family Communication: with family present in room at admission Disposition Plan: home 48 hours Admission status: inpatient   Lady Deutscher MD [facility] Pager [phone]  If 7PM-7AM, please contact night-coverage [email or web address] Password [email or web address]  01/25/2017, 12:03 AM

## 2017-01-24 NOTE — ED Provider Notes (Signed)
[facility] DEPT Provider Note   CSN: [id number] Arrival date & time: 01/24/17  [place]     History   Chief Complaint Chief Complaint  Patient presents with  . Chest Pain  . Arm Pain  . Abdominal Pain    HPI Cheryl Barnes is a 66 y.o. female. Chief complaint is abdominal pain, and chest pain.  HPI: This is a 66 year old female with a history of diverticulitis on multiple occasions. Has been admitted. His never had a surgical procedure. Has a history of a known left bundle-branch block that was discovered during a recent admission a few years ago. She underwent outpatient testing with Dr. Brenton Grills normal "stress test".  Symptoms started late last early this morning with left lower quadrant abdominal pain. She states this was all too familiar to her from her past episodes of diverticulitis. It worsened over the course of the day and "got bad much worse than it had gotten before".  She started developing some pain in her anterior chest rating to her anterior bilateral shoulders described as a pressure. She was not short of breath. She was not nauseated. She did not describe reflux symptoms.  Past Medical History:  Diagnosis Date  . Allergy   . Diverticulitis   . Hypertension   . LBBB (left bundle branch block)   . Migraine    history    Patient Active Problem List   Diagnosis Date Noted  . Recurrent herpes labialis 09/21/2016  . Pure hypercholesterolemia 09/05/2016  . BMI 30.0-30.9,adult 09/05/2016  . Bilateral hip pain 09/14/2015  . Routine general medical examination at a health care facility 09/14/2015  . LBBB (left bundle branch block) 12/23/2014  . Hematochezia 12/23/2014  . Abdominal pain   . Diverticulitis of large intestine without perforation or abscess without bleeding   . Solitary pulmonary nodule 06/09/2014  . Left lower lobe pneumonia ([place]) 10/06/2013  . Urinary incontinence in female 03/02/2013  . Postmenopause atrophic vaginitis 03/02/2013  .  RUQ PAIN 04/01/2007  . Essential hypertension 03/24/2007  . Diverticulitis of colon 03/17/2007    Past Surgical History:  Procedure Laterality Date  . CESAREAN SECTION    . CHOLECYSTECTOMY    . FRACTURE SURGERY    . PARTIAL HYSTERECTOMY     Abdominal  . ROTATOR CUFF REPAIR Left     OB History    No data available       Home Medications    Prior to Admission medications   Medication Sig Start Date End Date Taking? Authorizing Provider  amLODipine (NORVASC) 5 MG tablet Take 1 tablet (5 mg total) by mouth daily. 09/10/16  Yes [name], [name], MD  Calcium Citrate-Vitamin D (CALCIUM + D PO) Take 600 mg by mouth 2 (two) times daily.   Yes [provider]  carvedilol (COREG) 3.125 MG tablet Take 1 tablet (3.125 mg total) by mouth 2 (two) times daily with a meal. 09/10/16  Yes [name], [name], MD  docusate sodium (COLACE) 100 MG capsule Take 1 capsule (100 mg total) by mouth 2 (two) times daily. Patient taking differently: Take 100 mg by mouth daily.  05/08/16  Yes Haney, Alyssa A, MD  loratadine (CLARITIN) 10 MG tablet Take 10 mg by mouth daily.    Yes [provider]  Docusate Sodium (COLACE PO) Take 100 mg by mouth daily as needed (to prevent hard stools).     [provider]  valACYclovir (VALTREX) 1000 MG tablet TAKE 2 TABLETS BY MOUTH AS NEEDED UPON ACUTE  ONSET AND ASAP AND REPEAT DOSE IN 12 HOURS 01/02/17   [name], [name], MD    Family History Family History  Problem Relation Age of Onset  . Hypertension Father   . Diabetes Father        adult onset  . Colon cancer Father   . Hypertension Mother   . Breast cancer Mother        breast  . Cancer Mother        breast  . Asthma Maternal Grandfather   . Hypertension Brother   . Diabetes Brother     Social History Social History  Substance Use Topics  . Smoking status: Never Smoker  . Smokeless tobacco: Never Used  . Alcohol use No     Allergies   Seasonal ic [cholestatin]; Tape; Latex;  and Penicillins   Review of Systems Review of Systems  Constitutional: Negative for appetite change, chills, diaphoresis, fatigue and fever.  HENT: Negative for mouth sores, sore throat and trouble swallowing.   Eyes: Negative for visual disturbance.  Respiratory: Positive for chest tightness. Negative for cough, shortness of breath and wheezing.   Cardiovascular: Positive for chest pain.  Gastrointestinal: Positive for abdominal pain and nausea. Negative for abdominal distention, diarrhea and vomiting.  Endocrine: Negative for polydipsia, polyphagia and polyuria.  Genitourinary: Negative for dysuria, frequency and hematuria.  Musculoskeletal: Negative for gait problem.  Skin: Negative for color change, pallor and rash.  Neurological: Negative for dizziness, syncope, light-headedness and headaches.  Hematological: Does not bruise/bleed easily.  Psychiatric/Behavioral: Negative for behavioral problems and confusion.     Physical Exam Updated Vital Signs BP 113/73 (BP Location: Right Arm)   Pulse 80   Temp 98.8 F (37.1 C) (Oral)   Resp 14   Ht 5\' 1"  (1.549 m)   Wt 65.8 kg (145 lb)   SpO2 95%   BMI 27.40 kg/m   Physical Exam  Constitutional: She is oriented to person, place, and time. She appears well-developed and well-nourished. No distress.  Slow moving. Appears uncomfortable. Not pale or diaphoretic.  HENT:  Head: Normocephalic.  Eyes: Conjunctivae are normal. Pupils are equal, round, and reactive to light. No scleral icterus.  Neck: Normal range of motion. Neck supple. No thyromegaly present.  Cardiovascular: Normal rate and regular rhythm.  Exam reveals no gallop and no friction rub.   No murmur heard. Regular rate and rhythm. Normal heart tones. Clear bilateral breath sounds. Symmetric upper extremity pulses, and blood pressures. No motor deficits noted of the extremities.  Pulmonary/Chest: Effort normal and breath sounds normal. No respiratory distress. She has no  wheezes. She has no rales.  Abdominal: Soft. Bowel sounds are normal. She exhibits no distension. There is no tenderness. There is no rebound.  To palpation with some rebound tenderness in left lower abdomen. No rigidity or frank peritonitis noted.  Musculoskeletal: Normal range of motion.  Neurological: She is alert and oriented to person, place, and time.  Skin: Skin is warm and dry. No rash noted.  Psychiatric: She has a normal mood and affect. Her behavior is normal.     ED Treatments / Results  Labs (all labs ordered are listed, but only abnormal results are displayed) Labs Reviewed  COMPREHENSIVE METABOLIC PANEL - Abnormal; Notable for the following:       Result Value   Glucose, Bld 102 (*)    All other components within normal limits  URINALYSIS, ROUTINE W REFLEX MICROSCOPIC - Abnormal; Notable for the following:    Specific  Gravity, Urine 1.044 (*)    Ketones, ur 5 (*)    All other components within normal limits  CBC  I-STAT TROPOININ, ED    EKG  EKG Interpretation  Date/Time:  Thursday Jan 24 2017 18:28:19 EDT Ventricular Rate:  88 PR Interval:    QRS Duration: 140 QT Interval:  398 QTC Calculation: 482 R Axis:   6 Text Interpretation:  Sinus rhythm Left bundle branch block Baseline wander in lead(s) II III aVR aVL aVF Confirmed by Tanna Furry [phone]) on 01/24/2017 6:35:00 PM       Radiology Dg Chest 2 View  Result Date: 01/24/2017 CLINICAL DATA:  Chest pain extending down both arms.  DVT. EXAM: CHEST  2 VIEW COMPARISON:  06/09/2014. FINDINGS: Normal sized heart. Clear lungs with normal vascularity. Unremarkable bones. IMPRESSION: No acute abnormality. Electronically Signed   By: Claudie Revering M.D.   On: 01/24/2017 18:48   Ct Abdomen Pelvis W Contrast  Result Date: 01/24/2017 CLINICAL DATA:  Abdominal bloating and sharp pain EXAM: CT ABDOMEN AND PELVIS WITH CONTRAST TECHNIQUE: Multidetector CT imaging of the abdomen and pelvis was performed using the standard  protocol following bolus administration of intravenous contrast. CONTRAST:  21mL ISOVUE-300 IOPAMIDOL (ISOVUE-300) INJECTION 61%, 154mL ISOVUE-300 IOPAMIDOL (ISOVUE-300) INJECTION 61% COMPARISON:  05/08/2016 FINDINGS: Lower chest: Lung bases demonstrate no acute consolidation or pleural effusion. Borderline cardiomegaly. Hepatobiliary: Minimal central intra hepatic biliary dilatation, unchanged. Surgical absence of the gallbladder. Pancreas: Unremarkable. No pancreatic ductal dilatation or surrounding inflammatory changes. Spleen: Normal in size without focal abnormality. Adrenals/Urinary Tract: Adrenal glands are within normal limits. 4.9 cm cyst upper pole left kidney. Bladder normal. Stomach/Bowel: The stomach is nonenlarged. No dilated small bowel. Normal appendix. Focal wall thickening and inflammatory change at the distal descending/ proximal sigmoid colon, consistent with acute diverticulitis. No abscess. Vascular/Lymphatic: Aortic atherosclerosis. Stable 1.5 cm splenic artery aneurysm. No enlarged lymph nodes Reproductive: Status post hysterectomy. No adnexal masses. Other: No free air or free fluid. Musculoskeletal: No acute or suspicious bone lesion. IMPRESSION: 1. Findings consistent with acute diverticulitis of the distal descending/proximal sigmoid colon. No perforation or abscess. 2. Stable 1.5 cm splenic artery aneurysm. Electronically Signed   By: Donavan Foil M.D.   On: 01/24/2017 21:24    Procedures Procedures (including critical care time)  Medications Ordered in ED Medications  morphine 2 MG/ML injection 4 mg (4 mg Intravenous Given 01/24/17 1934)  iopamidol (ISOVUE-300) 61 % injection (not administered)  iopamidol (ISOVUE-300) 61 % injection (not administered)  HYDROmorphone (DILAUDID) injection 1 mg (not administered)  ondansetron (ZOFRAN) injection 4 mg (not administered)  ondansetron (ZOFRAN) injection 4 mg (4 mg Intravenous Given 01/24/17 1930)  sodium chloride 0.9 % bolus 500  mL (0 mLs Intravenous Stopped 01/24/17 2044)  0.9 %  sodium chloride infusion ( Intravenous New Bag/Given 01/24/17 2044)  iopamidol (ISOVUE-300) 61 % injection 30 mL (30 mLs Oral Contrast Given 01/24/17 1930)  iopamidol (ISOVUE-300) 61 % injection 100 mL (100 mLs Intravenous Contrast Given 01/24/17 2058)  ciprofloxacin (CIPRO) IVPB 400 mg (400 mg Intravenous New Bag/Given 01/24/17 2147)  metroNIDAZOLE (FLAGYL) tablet 500 mg (500 mg Oral Given 01/24/17 2148)     Initial Impression / Assessment and Plan / ED Course  I have reviewed the triage vital signs and the nursing notes.  Pertinent labs & imaging results that were available during my care of the patient were reviewed by me and considered in my medical decision making (see chart for details).    EKG shows left  bundle branch block and has unchanged morphology versus comparison. First troponin normal. Her chest pain, and abdominal pain have improved after IV pain medication. Her chest pain is actually resolved. CT scan shows diverticulitis of the left ascending and sigmoid colon without perforation or abscess or obvious complication. Patient given IV Cipro, and Flagyl. Her pain was still significant her left lower abdomen. I discussed the case with the hospitalist. Patient will be admitted for ACS rule out as well as ongoing treatment for her diverticulitis.  Final Clinical Impressions(s) / ED Diagnoses   Final diagnoses:  Chest pain, unspecified type  Diverticulitis    New Prescriptions New Prescriptions   No medications on file     Tanna Furry, MD 01/24/17 2325

## 2017-01-24 NOTE — ED Triage Notes (Addendum)
Pt from home with c/o lower abdominal bloating and sharp pain that she rates 8/10. Pt states this pain is secondary to diverticulitis and began yesterday. Pt also has complaints of chest pressure in the center of her chest that radiates to both arms. Pt rates this pain as a 6/10. Pt reports feeling light headed and "as if she might pass out"

## 2017-01-24 NOTE — ED Notes (Signed)
Pt unable to give urine sample, but aware that we need one.  

## 2017-01-25 ENCOUNTER — Inpatient Hospital Stay (HOSPITAL_COMMUNITY): Payer: Medicare Other

## 2017-01-25 ENCOUNTER — Encounter (HOSPITAL_COMMUNITY): Payer: Self-pay | Admitting: Radiology

## 2017-01-25 DIAGNOSIS — K5792 Diverticulitis of intestine, part unspecified, without perforation or abscess without bleeding: Secondary | ICD-10-CM

## 2017-01-25 DIAGNOSIS — R079 Chest pain, unspecified: Secondary | ICD-10-CM

## 2017-01-25 DIAGNOSIS — I10 Essential (primary) hypertension: Secondary | ICD-10-CM

## 2017-01-25 LAB — BASIC METABOLIC PANEL
Anion gap: 6 (ref 5–15)
BUN: 11 mg/dL (ref 6–20)
CHLORIDE: 109 mmol/L (ref 101–111)
CO2: 25 mmol/L (ref 22–32)
Calcium: 8.6 mg/dL — ABNORMAL LOW (ref 8.9–10.3)
Creatinine, Ser: 0.58 mg/dL (ref 0.44–1.00)
GFR calc Af Amer: >60 mL/min (ref >60)
GFR calc non Af Amer: >60 mL/min (ref >60)
GLUCOSE: 132 mg/dL — AB (ref 65–99)
POTASSIUM: 4.2 mmol/L (ref 3.5–5.1)
Sodium: 140 mmol/L (ref 135–145)

## 2017-01-25 LAB — CBC
HEMATOCRIT: 36.7 % (ref 36.0–46.0)
Hemoglobin: 12.1 g/dL (ref 12.0–15.0)
MCH: 30.1 pg (ref 26.0–34.0)
MCHC: 33 g/dL (ref 30.0–36.0)
MCV: 91.3 fL (ref 78.0–100.0)
Platelets: 125 10*3/uL — ABNORMAL LOW (ref 150–400)
RBC: 4.02 MIL/uL (ref 3.87–5.11)
RDW: 13.1 % (ref 11.5–15.5)
WBC: 7.5 10*3/uL (ref 4.0–10.5)

## 2017-01-25 MED ORDER — PROMETHAZINE HCL 25 MG/ML IJ SOLN
12.5000 mg | Freq: Four times a day (QID) | INTRAMUSCULAR | Status: DC | PRN
  Administered 2017-01-26: 12.5 mg via INTRAVENOUS
  Filled 2017-01-25: qty 1

## 2017-01-25 MED ORDER — IOPAMIDOL (ISOVUE-370) INJECTION 76%
INTRAVENOUS | Status: AC
  Filled 2017-01-25: qty 100

## 2017-01-25 MED ORDER — IOPAMIDOL (ISOVUE-370) INJECTION 76%
100.0000 mL | Freq: Once | INTRAVENOUS | Status: AC | PRN
  Administered 2017-01-25: 100 mL via INTRAVENOUS

## 2017-01-25 MED ORDER — ONDANSETRON HCL 4 MG PO TABS
4.0000 mg | ORAL_TABLET | ORAL | Status: DC
  Administered 2017-01-27: 4 mg via ORAL
  Filled 2017-01-25: qty 1

## 2017-01-25 MED ORDER — ONDANSETRON HCL 4 MG/2ML IJ SOLN
4.0000 mg | INTRAMUSCULAR | Status: DC
  Administered 2017-01-25 – 2017-01-27 (×11): 4 mg via INTRAVENOUS
  Filled 2017-01-25 (×11): qty 2

## 2017-01-25 NOTE — Progress Notes (Addendum)
PROGRESS NOTE    Cheryl Barnes   QHU:[id number]  DOB: 1950/10/05  DOA: 01/24/2017 PCP: [name], [name], MD   Brief Narrative:  Cheryl Barnes is a 66 y.o. female with medical history significant of diverticulosis, essential hypertension, left bundle branch block, and urinary incontinence who presents with left lower abdominal pain and dizziness. She is admitted for diverticulitis.    Subjective: Has increasing nausea but no vomiting. Abdominal pain is slightly improved. Normal BM yesterday AM. No fever or chills.   Assessment & Plan:   Principal Problem:   Acute diverticulitis with dehydration - of descending and sigmoid colon - dizziness has resolved- she has nausea for which I have added Zofran Q4 hrs routine instead of PRN - cont Cipro and Flagyl, IVF and clear liquids  Active Problems:   Essential hypertension - amlodipine, Coreg  Mild hyponatremia - ? Due to infection- cont Lovenox for now   Chest pain - likely related to abdominal issues - CTA of chest shows no pulmonary abnormalitis  Thyroid nodule - noted incidentally on above CTA- needs outpt w/u   ? Sclerotic lesion on T7 - bone scan as outpt   DVT prophylaxis: Lovenox Code Status: Full code Family Communication:  Disposition Plan: home when stable Consultants:    Procedures:    Antimicrobials:  Anti-infectives    Start     Dose/Rate Route Frequency Ordered Stop   01/25/17 1000  ciprofloxacin (CIPRO) IVPB 400 mg     400 mg 200 mL/hr over 60 Minutes Intravenous Every 12 hours 01/24/17 2350     01/25/17 0600  metroNIDAZOLE (FLAGYL) IVPB 500 mg     500 mg 100 mL/hr over 60 Minutes Intravenous Every 8 hours 01/24/17 2350     01/24/17 2130  ciprofloxacin (CIPRO) IVPB 400 mg     400 mg 200 mL/hr over 60 Minutes Intravenous  Once 01/24/17 2118 01/24/17 2247   01/24/17 2130  metroNIDAZOLE (FLAGYL) tablet 500 mg     500 mg Oral  Once 01/24/17 2118 01/24/17 2148       Objective: Vitals:   01/25/17 0016 01/25/17 0110 01/25/17 0154 01/25/17 1039  BP: (!) 142/80 (!) 144/78 123/69 124/78  Pulse: 75 76 60 68  Resp: 19 12 16    Temp:   97.7 F (36.5 C)   TempSrc:   Oral   SpO2: 95% 92% 94%   Weight:      Height:        Intake/Output Summary (Last 24 hours) at 01/25/17 1330 Last data filed at 01/25/17 1008  Gross per 24 hour  Intake             1120 ml  Output                0 ml  Net             1120 ml   Filed Weights   01/24/17 1829  Weight: 65.8 kg (145 lb)    Examination: General exam: Appears comfortable  HEENT: PERRLA, oral mucosa moist, no sclera icterus or thrush Respiratory system: Clear to auscultation. Respiratory effort normal. Cardiovascular system: S1 & S2 heard, RRR.  No murmurs  Gastrointestinal system: Abdomen soft,  Tender in LUQ and LLQ, nondistended. Normal bowel sound. No organomegaly Central nervous system: Alert and oriented. No focal neurological deficits. Extremities: No cyanosis, clubbing or edema Skin: No rashes or ulcers Psychiatry:  Mood & affect appropriate.     Data Reviewed: I have personally reviewed following  labs and imaging studies  CBC:  Recent Labs Lab 01/24/17 1900 01/25/17 0546  WBC 10.2 7.5  HGB 13.9 12.1  HCT 41.3 36.7  MCV 91.0 91.3  PLT 160 161*   Basic Metabolic Panel:  Recent Labs Lab 01/24/17 1900 01/25/17 0546  NA 140 140  K 3.8 4.2  CL 108 109  CO2 24 25  GLUCOSE 102* 132*  BUN 17 11  CREATININE 0.77 0.58  CALCIUM 9.2 8.6*   GFR: Estimated Creatinine Clearance: 60.9 mL/min (by C-G formula based on SCr of 0.58 mg/dL). Liver Function Tests:  Recent Labs Lab 01/24/17 1900  AST 19  ALT 20  ALKPHOS 94  BILITOT 0.7  PROT 7.9  ALBUMIN 4.4   No results for input(s): LIPASE, AMYLASE in the last 168 hours. No results for input(s): AMMONIA in the last 168 hours. Coagulation Profile: No results for input(s): INR, PROTIME in the last 168 hours. Cardiac Enzymes: No results for input(s):  CKTOTAL, CKMB, CKMBINDEX, TROPONINI in the last 168 hours. BNP (last 3 results) No results for input(s): PROBNP in the last 8760 hours. HbA1C: No results for input(s): HGBA1C in the last 72 hours. CBG: No results for input(s): GLUCAP in the last 168 hours. Lipid Profile: No results for input(s): CHOL, HDL, LDLCALC, TRIG, CHOLHDL, LDLDIRECT in the last 72 hours. Thyroid Function Tests: No results for input(s): TSH, T4TOTAL, FREET4, T3FREE, THYROIDAB in the last 72 hours. Anemia Panel: No results for input(s): VITAMINB12, FOLATE, FERRITIN, TIBC, IRON, RETICCTPCT in the last 72 hours. Urine analysis:    Component Value Date/Time   COLORURINE YELLOW 01/24/2017 1903   APPEARANCEUR CLEAR 01/24/2017 1903   LABSPEC 1.044 (H) 01/24/2017 1903   PHURINE 6.0 01/24/2017 1903   GLUCOSEU NEGATIVE 01/24/2017 1903   GLUCOSEU NEGATIVE 03/14/2007 0945   HGBUR NEGATIVE 01/24/2017 1903   HGBUR negative 03/10/2010 0819   BILIRUBINUR NEGATIVE 01/24/2017 1903   BILIRUBINUR n 08/29/2016 0942   KETONESUR 5 (A) 01/24/2017 1903   PROTEINUR NEGATIVE 01/24/2017 1903   UROBILINOGEN 0.2 08/29/2016 0942   UROBILINOGEN 0.2 12/18/2014 1708   NITRITE NEGATIVE 01/24/2017 1903   LEUKOCYTESUR NEGATIVE 01/24/2017 1903   Sepsis Labs: @LABRCNTIP (procalcitonin:4,lacticidven:4) )No results found for this or any previous visit (from the past 240 hour(s)).       Radiology Studies: Dg Chest 2 View  Result Date: 01/24/2017 CLINICAL DATA:  Chest pain extending down both arms.  DVT. EXAM: CHEST  2 VIEW COMPARISON:  06/09/2014. FINDINGS: Normal sized heart. Clear lungs with normal vascularity. Unremarkable bones. IMPRESSION: No acute abnormality. Electronically Signed   By: Claudie Revering M.D.   On: 01/24/2017 18:48   Ct Angio Chest Pe W Or Wo Contrast  Result Date: 01/25/2017 CLINICAL DATA:  Acute onset of mid chest pain and tightness. Initial encounter. EXAM: CT ANGIOGRAPHY CHEST WITH CONTRAST TECHNIQUE: Multidetector  CT imaging of the chest was performed using the standard protocol during bolus administration of intravenous contrast. Multiplanar CT image reconstructions and MIPs were obtained to evaluate the vascular anatomy. CONTRAST:  100 mL of Isovue 370 IV contrast COMPARISON:  Chest radiograph performed 01/24/2017 FINDINGS: Cardiovascular:  There is no evidence of pulmonary embolus. The heart is normal in size. The thoracic aorta is grossly unremarkable. The great vessels are within normal limits. Mediastinum/Nodes: Visualized mediastinal nodes remain normal in size. No pericardial effusion is identified. A 2.3 cm heterogeneous mass is noted arising at the posterior aspect of the right thyroid lobe. No axillary lymphadenopathy is appreciated. Lungs/Pleura: Bibasilar atelectasis is  noted, with mild peripheral scarring. No pleural effusion or pneumothorax is seen. No masses are identified. Upper Abdomen: The visualized portions of the liver and spleen are grossly unremarkable. A tiny hiatal hernia is noted. The visualized portions of the pancreas and adrenal glands are within normal limits. Musculoskeletal: There is question of a vague sclerotic lesion at the left side of vertebral body T7, of uncertain significance. The visualized musculature is unremarkable in appearance. Review of the MIP images confirms the above findings. IMPRESSION: 1. No evidence of pulmonary embolus. 2. Bibasilar atelectasis, with mild peripheral scarring. 3. **An incidental finding of potential clinical significance has been found. 2.3 cm heterogeneous mass arising at the posterior aspect of the right thyroid lobe. Recommend further evaluation with thyroid ultrasound. If patient is clinically hyperthyroid, consider nuclear medicine thyroid uptake and scan.** 4. Question of vague sclerotic lesion at the left-sided vertebral body T7, of uncertain significance. Bone scan would be helpful for further evaluation, when and as deemed clinically appropriate.  Electronically Signed   By: Garald Balding M.D.   On: 01/25/2017 01:18   Ct Abdomen Pelvis W Contrast  Result Date: 01/24/2017 CLINICAL DATA:  Abdominal bloating and sharp pain EXAM: CT ABDOMEN AND PELVIS WITH CONTRAST TECHNIQUE: Multidetector CT imaging of the abdomen and pelvis was performed using the standard protocol following bolus administration of intravenous contrast. CONTRAST:  31mL ISOVUE-300 IOPAMIDOL (ISOVUE-300) INJECTION 61%, 125mL ISOVUE-300 IOPAMIDOL (ISOVUE-300) INJECTION 61% COMPARISON:  05/08/2016 FINDINGS: Lower chest: Lung bases demonstrate no acute consolidation or pleural effusion. Borderline cardiomegaly. Hepatobiliary: Minimal central intra hepatic biliary dilatation, unchanged. Surgical absence of the gallbladder. Pancreas: Unremarkable. No pancreatic ductal dilatation or surrounding inflammatory changes. Spleen: Normal in size without focal abnormality. Adrenals/Urinary Tract: Adrenal glands are within normal limits. 4.9 cm cyst upper pole left kidney. Bladder normal. Stomach/Bowel: The stomach is nonenlarged. No dilated small bowel. Normal appendix. Focal wall thickening and inflammatory change at the distal descending/ proximal sigmoid colon, consistent with acute diverticulitis. No abscess. Vascular/Lymphatic: Aortic atherosclerosis. Stable 1.5 cm splenic artery aneurysm. No enlarged lymph nodes Reproductive: Status post hysterectomy. No adnexal masses. Other: No free air or free fluid. Musculoskeletal: No acute or suspicious bone lesion. IMPRESSION: 1. Findings consistent with acute diverticulitis of the distal descending/proximal sigmoid colon. No perforation or abscess. 2. Stable 1.5 cm splenic artery aneurysm. Electronically Signed   By: Donavan Foil M.D.   On: 01/24/2017 21:24      Scheduled Meds: . amLODipine  5 mg Oral Daily  . calcium-vitamin D  1 tablet Oral BID  . carvedilol  3.125 mg Oral BID WC  . docusate sodium  100 mg Oral BID  . enoxaparin (LOVENOX)  injection  40 mg Subcutaneous Q24H  . loratadine  10 mg Oral Daily  . ondansetron  4 mg Oral Q4H   Or  . ondansetron (ZOFRAN) IV  4 mg Intravenous Q4H   Continuous Infusions: . 0.9 % NaCl with KCl 40 mEq / L 125 mL/hr (01/25/17 0215)  . ciprofloxacin Stopped (01/25/17 1141)  . metronidazole Stopped (01/25/17 0713)     LOS: 1 day    Time spent in minutes: 35    Debbe Odea, MD [facility] Pager: [email or web address] Password [email or web address] 01/25/2017, 1:30 PM

## 2017-01-25 NOTE — Care Management Note (Signed)
Case Management Note  Patient Details  Name: Cheryl Barnes MRN: [id number] Date of Birth: 1951/04/11  Subjective/Objective:                  66 y.o. female with medical history significant of diverticulosis, essential hypertension, left bundle branch block, and urinary incontinence who presents with severe abdominal pain which began in the early hours of this morning.  She also has some tightness in her chest.  Symptoms started late last early this morning with sharp left lower quadrant abdominal pain. She states this was all too familiar to her from her past episodes of diverticulitis. It worsened over the course of the day and "got bad much worse than it had gotten before".  Nothing made it better movement made it worse. Palpation of the abdomen made it worse as well. Does not radiate.  She started developing some pain in her anterior chest rating to her anterior bilateral shoulders described as a pressure. She was not short of breath. She was not nauseated. She did not describe reflux symptoms.  When I saw the patient her oxygen level was dropping into the mid to low 80 percentile on room air. And she continued to complain of chest tightness.  With regards to her chest pain she followed up with Dr. Gilles Chiquito and underwent a stress test in the office and had other noninvasive testing which was unremarkable.   ED Course: Patient underwent CT scanning of the abdomen which revealed diverticulitis without any abscess. She continued to have chest pain which was relieved after she received IV morphine. Her abdominal pain persisted and she required IV dialogue. She stated this pain is worse than it has been in the past.  Action/Plan: Date:  January 25, 2017  Chart reviewed for concurrent status and case management needs.  Will continue to follow patient progress.  Discharge Planning: following for needs  Expected discharge date: [id number]  Velva Harman, BSN, [place], [place]   Expected  Discharge Date:                  Expected Discharge Plan:  Home/Self Care  In-House Referral:     Discharge planning Services  CM Consult  Post Acute Care Choice:    Choice offered to:     DME Arranged:    DME Agency:     HH Arranged:    HH Agency:     Status of Service:  In process, will continue to follow  If discussed at Long Length of Stay Meetings, dates discussed:    Additional Comments:  Leeroy Cha, RN 01/25/2017, 9:30 AM

## 2017-01-26 DIAGNOSIS — R0902 Hypoxemia: Secondary | ICD-10-CM

## 2017-01-26 LAB — CBC
HCT: 35.1 % — ABNORMAL LOW (ref 36.0–46.0)
HEMOGLOBIN: 11.5 g/dL — AB (ref 12.0–15.0)
MCH: 30.1 pg (ref 26.0–34.0)
MCHC: 32.8 g/dL (ref 30.0–36.0)
MCV: 91.9 fL (ref 78.0–100.0)
Platelets: 122 10*3/uL — ABNORMAL LOW (ref 150–400)
RBC: 3.82 MIL/uL — AB (ref 3.87–5.11)
RDW: 13 % (ref 11.5–15.5)
WBC: 4.8 10*3/uL (ref 4.0–10.5)

## 2017-01-26 LAB — BASIC METABOLIC PANEL
ANION GAP: 5 (ref 5–15)
BUN: 7 mg/dL (ref 6–20)
CHLORIDE: 110 mmol/L (ref 101–111)
CO2: 26 mmol/L (ref 22–32)
CREATININE: 0.65 mg/dL (ref 0.44–1.00)
Calcium: 8.6 mg/dL — ABNORMAL LOW (ref 8.9–10.3)
GFR calc non Af Amer: >60 mL/min (ref >60)
Glucose, Bld: 91 mg/dL (ref 65–99)
Potassium: 3.9 mmol/L (ref 3.5–5.1)
Sodium: 141 mmol/L (ref 135–145)

## 2017-01-26 MED ORDER — ONDANSETRON 4 MG PO TBDP: 4.0000 mg | ORAL_TABLET | Freq: Three times a day (TID) | ORAL | 0 refills | Status: DC | PRN

## 2017-01-26 MED ORDER — OXYCODONE HCL 5 MG PO TABS: 5.0000 mg | ORAL_TABLET | Freq: Four times a day (QID) | ORAL | 0 refills | Status: DC | PRN

## 2017-01-26 NOTE — Progress Notes (Signed)
PROGRESS NOTE    Cheryl Barnes   YJE:[id number]  DOB: [birth year]  DOA: 01/24/2017 PCP: [name], [name], MD   Brief Narrative:  Cheryl Barnes is a 66 y.o. female with medical history significant of diverticulosis, essential hypertension, left bundle branch block, and urinary incontinence who presents with left lower abdominal pain and dizziness. She is admitted for diverticulitis.    Subjective: Continues to have nausea but no vomiting. Pain is about 4/5 and still in LLQ. Feeling dizzy intermittently.   Assessment & Plan:   Principal Problem:   Acute diverticulitis with dehydration - of descending and sigmoid colon - dizziness comes and goes - she has nausea for which I have added Zofran Q4 hrs routine instead of PRN - cont Cipro and Flagyl, IVF - advance diet today -   Active Problems:   Essential hypertension - amlodipine, Coreg  Mild thrombocytopenia - ? Due to infection- cont Lovenox for now   Chest pain - likely related to abdominal issues - CTA of chest shows no pulmonary abnormalitis  Thyroid nodule - noted incidentally on above CTA- needs outpt w/u   ? Sclerotic lesion on T7 - bone scan as outpt   DVT prophylaxis: Lovenox Code Status: Full code Family Communication:  Disposition Plan: home when stable Consultants:    Procedures:    Antimicrobials:  Anti-infectives    Start     Dose/Rate Route Frequency Ordered Stop   01/25/17 1000  ciprofloxacin (CIPRO) IVPB 400 mg     400 mg 200 mL/hr over 60 Minutes Intravenous Every 12 hours 01/24/17 2350     01/25/17 0600  metroNIDAZOLE (FLAGYL) IVPB 500 mg     500 mg 100 mL/hr over 60 Minutes Intravenous Every 8 hours 01/24/17 2350     01/24/17 2130  ciprofloxacin (CIPRO) IVPB 400 mg     400 mg 200 mL/hr over 60 Minutes Intravenous  Once 01/24/17 2118 01/24/17 2247   01/24/17 2130  metroNIDAZOLE (FLAGYL) tablet 500 mg     500 mg Oral  Once 01/24/17 2118 01/24/17 2148       Objective: Vitals:   01/25/17 2106 01/26/17 0446 01/26/17 1138 01/26/17 1300  BP: 129/77 102/63 130/73 125/78  Pulse: 72 64 61 77  Resp: 18 18 16 18   Temp: 98.8 F (37.1 C) 98 F (36.7 C) 98.2 F (36.8 C) 98.2 F (36.8 C)  TempSrc: Oral Oral Oral Oral  SpO2: 94% 95% 99% 97%  Weight:      Height:        Intake/Output Summary (Last 24 hours) at 01/26/17 1437 Last data filed at 01/26/17 0900  Gross per 24 hour  Intake          2214.58 ml  Output                0 ml  Net          2214.58 ml   Filed Weights   01/24/17 1829  Weight: 65.8 kg (145 lb)    Examination: General exam: Appears comfortable  HEENT: PERRLA, oral mucosa moist, no sclera icterus or thrush Respiratory system: Clear to auscultation. Respiratory effort normal. Cardiovascular system: S1 & S2 heard, RRR.  No murmurs  Gastrointestinal system: Abdomen soft,  Tender in LUQ and LLQ- unchanged from yesterday, nondistended. Normal bowel sound. No organomegaly Central nervous system: Alert and oriented. No focal neurological deficits. Extremities: No cyanosis, clubbing or edema Skin: No rashes or ulcers Psychiatry:  Mood & affect appropriate.  Data Reviewed: I have personally reviewed following labs and imaging studies  CBC:  Recent Labs Lab 01/24/17 1900 01/25/17 0546 01/26/17 0615  WBC 10.2 7.5 4.8  HGB 13.9 12.1 11.5*  HCT 41.3 36.7 35.1*  MCV 91.0 91.3 91.9  PLT 160 125* 017*   Basic Metabolic Panel:  Recent Labs Lab 01/24/17 1900 01/25/17 0546 01/26/17 0615  NA 140 140 141  K 3.8 4.2 3.9  CL 108 109 110  CO2 24 25 26   GLUCOSE 102* 132* 91  BUN 17 11 7   CREATININE 0.77 0.58 0.65  CALCIUM 9.2 8.6* 8.6*   GFR: Estimated Creatinine Clearance: 60.9 mL/min (by C-G formula based on SCr of 0.65 mg/dL). Liver Function Tests:  Recent Labs Lab 01/24/17 1900  AST 19  ALT 20  ALKPHOS 94  BILITOT 0.7  PROT 7.9  ALBUMIN 4.4   No results for input(s): LIPASE, AMYLASE in the last 168 hours. No results for  input(s): AMMONIA in the last 168 hours. Coagulation Profile: No results for input(s): INR, PROTIME in the last 168 hours. Cardiac Enzymes: No results for input(s): CKTOTAL, CKMB, CKMBINDEX, TROPONINI in the last 168 hours. BNP (last 3 results) No results for input(s): PROBNP in the last 8760 hours. HbA1C: No results for input(s): HGBA1C in the last 72 hours. CBG: No results for input(s): GLUCAP in the last 168 hours. Lipid Profile: No results for input(s): CHOL, HDL, LDLCALC, TRIG, CHOLHDL, LDLDIRECT in the last 72 hours. Thyroid Function Tests: No results for input(s): TSH, T4TOTAL, FREET4, T3FREE, THYROIDAB in the last 72 hours. Anemia Panel: No results for input(s): VITAMINB12, FOLATE, FERRITIN, TIBC, IRON, RETICCTPCT in the last 72 hours. Urine analysis:    Component Value Date/Time   COLORURINE YELLOW 01/24/2017 1903   APPEARANCEUR CLEAR 01/24/2017 1903   LABSPEC 1.044 (H) 01/24/2017 1903   PHURINE 6.0 01/24/2017 1903   GLUCOSEU NEGATIVE 01/24/2017 1903   GLUCOSEU NEGATIVE 03/14/2007 0945   HGBUR NEGATIVE 01/24/2017 1903   HGBUR negative 03/10/2010 0819   BILIRUBINUR NEGATIVE 01/24/2017 1903   BILIRUBINUR n 08/29/2016 0942   KETONESUR 5 (A) 01/24/2017 1903   PROTEINUR NEGATIVE 01/24/2017 1903   UROBILINOGEN 0.2 08/29/2016 0942   UROBILINOGEN 0.2 12/18/2014 1708   NITRITE NEGATIVE 01/24/2017 1903   LEUKOCYTESUR NEGATIVE 01/24/2017 1903   Sepsis Labs: @LABRCNTIP (procalcitonin:4,lacticidven:4) )No results found for this or any previous visit (from the past 240 hour(s)).       Radiology Studies: Dg Chest 2 View  Result Date: 01/24/2017 CLINICAL DATA:  Chest pain extending down both arms.  DVT. EXAM: CHEST  2 VIEW COMPARISON:  06/09/2014. FINDINGS: Normal sized heart. Clear lungs with normal vascularity. Unremarkable bones. IMPRESSION: No acute abnormality. Electronically Signed   By: Claudie Revering M.D.   On: 01/24/2017 18:48   Ct Angio Chest Pe W Or Wo  Contrast  Result Date: 01/25/2017 CLINICAL DATA:  Acute onset of mid chest pain and tightness. Initial encounter. EXAM: CT ANGIOGRAPHY CHEST WITH CONTRAST TECHNIQUE: Multidetector CT imaging of the chest was performed using the standard protocol during bolus administration of intravenous contrast. Multiplanar CT image reconstructions and MIPs were obtained to evaluate the vascular anatomy. CONTRAST:  100 mL of Isovue 370 IV contrast COMPARISON:  Chest radiograph performed 01/24/2017 FINDINGS: Cardiovascular:  There is no evidence of pulmonary embolus. The heart is normal in size. The thoracic aorta is grossly unremarkable. The great vessels are within normal limits. Mediastinum/Nodes: Visualized mediastinal nodes remain normal in size. No pericardial effusion is identified. A 2.3  cm heterogeneous mass is noted arising at the posterior aspect of the right thyroid lobe. No axillary lymphadenopathy is appreciated. Lungs/Pleura: Bibasilar atelectasis is noted, with mild peripheral scarring. No pleural effusion or pneumothorax is seen. No masses are identified. Upper Abdomen: The visualized portions of the liver and spleen are grossly unremarkable. A tiny hiatal hernia is noted. The visualized portions of the pancreas and adrenal glands are within normal limits. Musculoskeletal: There is question of a vague sclerotic lesion at the left side of vertebral body T7, of uncertain significance. The visualized musculature is unremarkable in appearance. Review of the MIP images confirms the above findings. IMPRESSION: 1. No evidence of pulmonary embolus. 2. Bibasilar atelectasis, with mild peripheral scarring. 3. **An incidental finding of potential clinical significance has been found. 2.3 cm heterogeneous mass arising at the posterior aspect of the right thyroid lobe. Recommend further evaluation with thyroid ultrasound. If patient is clinically hyperthyroid, consider nuclear medicine thyroid uptake and scan.** 4. Question of  vague sclerotic lesion at the left-sided vertebral body T7, of uncertain significance. Bone scan would be helpful for further evaluation, when and as deemed clinically appropriate. Electronically Signed   By: Garald Balding M.D.   On: 01/25/2017 01:18   Ct Abdomen Pelvis W Contrast  Result Date: 01/24/2017 CLINICAL DATA:  Abdominal bloating and sharp pain EXAM: CT ABDOMEN AND PELVIS WITH CONTRAST TECHNIQUE: Multidetector CT imaging of the abdomen and pelvis was performed using the standard protocol following bolus administration of intravenous contrast. CONTRAST:  58mL ISOVUE-300 IOPAMIDOL (ISOVUE-300) INJECTION 61%, 159mL ISOVUE-300 IOPAMIDOL (ISOVUE-300) INJECTION 61% COMPARISON:  05/08/2016 FINDINGS: Lower chest: Lung bases demonstrate no acute consolidation or pleural effusion. Borderline cardiomegaly. Hepatobiliary: Minimal central intra hepatic biliary dilatation, unchanged. Surgical absence of the gallbladder. Pancreas: Unremarkable. No pancreatic ductal dilatation or surrounding inflammatory changes. Spleen: Normal in size without focal abnormality. Adrenals/Urinary Tract: Adrenal glands are within normal limits. 4.9 cm cyst upper pole left kidney. Bladder normal. Stomach/Bowel: The stomach is nonenlarged. No dilated small bowel. Normal appendix. Focal wall thickening and inflammatory change at the distal descending/ proximal sigmoid colon, consistent with acute diverticulitis. No abscess. Vascular/Lymphatic: Aortic atherosclerosis. Stable 1.5 cm splenic artery aneurysm. No enlarged lymph nodes Reproductive: Status post hysterectomy. No adnexal masses. Other: No free air or free fluid. Musculoskeletal: No acute or suspicious bone lesion. IMPRESSION: 1. Findings consistent with acute diverticulitis of the distal descending/proximal sigmoid colon. No perforation or abscess. 2. Stable 1.5 cm splenic artery aneurysm. Electronically Signed   By: Donavan Foil M.D.   On: 01/24/2017 21:24      Scheduled  Meds: . amLODipine  5 mg Oral Daily  . calcium-vitamin D  1 tablet Oral BID  . carvedilol  3.125 mg Oral BID WC  . enoxaparin (LOVENOX) injection  40 mg Subcutaneous Q24H  . loratadine  10 mg Oral Daily  . ondansetron  4 mg Oral Q4H   Or  . ondansetron (ZOFRAN) IV  4 mg Intravenous Q4H   Continuous Infusions: . 0.9 % NaCl with KCl 40 mEq / L 125 mL/hr (01/25/17 2049)  . ciprofloxacin 400 mg (01/26/17 1201)  . metronidazole Stopped (01/26/17 0550)     LOS: 2 days    Time spent in minutes: 35    Debbe Odea, MD [facility] Pager: [email or web address] Password [facility] 01/26/2017, 2:37 PM

## 2017-01-27 DIAGNOSIS — E041 Nontoxic single thyroid nodule: Secondary | ICD-10-CM

## 2017-01-27 DIAGNOSIS — R109 Unspecified abdominal pain: Secondary | ICD-10-CM

## 2017-01-27 LAB — CBC
HCT: 39.5 % (ref 36.0–46.0)
Hemoglobin: 13.2 g/dL (ref 12.0–15.0)
MCH: 30.6 pg (ref 26.0–34.0)
MCHC: 33.4 g/dL (ref 30.0–36.0)
MCV: 91.4 fL (ref 78.0–100.0)
Platelets: 149 10*3/uL — ABNORMAL LOW (ref 150–400)
RBC: 4.32 MIL/uL (ref 3.87–5.11)
RDW: 12.9 % (ref 11.5–15.5)
WBC: 4.1 10*3/uL (ref 4.0–10.5)

## 2017-01-27 MED ORDER — CIPROFLOXACIN HCL 500 MG PO TABS: 500.0000 mg | ORAL_TABLET | Freq: Two times a day (BID) | ORAL | 0 refills | Status: DC

## 2017-01-27 MED ORDER — METRONIDAZOLE 500 MG PO TABS: 500.0000 mg | ORAL_TABLET | Freq: Three times a day (TID) | ORAL | 0 refills | Status: AC

## 2017-01-27 NOTE — Discharge Summary (Signed)
Physician Discharge Summary  Cheryl Barnes IRJ:[id number] DOB: 1951/08/06 DOA: 01/24/2017  PCP: [name], [name], MD  Admit date: 01/24/2017 Discharge date: 01/27/2017  Admitted From: home   Disposition:  home   Recommendations for Outpatient Follow-up:  1. Needs outpt work up for Thyroid nodule and possible sclerotic lesion on T7  Discharge Condition:  stable   CODE STATUS:  Full code   Consultations:  none    Discharge Diagnoses:  Principal Problem:   Acute diverticulitis Active Problems:   Essential hypertension   Chest pain   Thyroid nodule   Subjective: Abdominal pain improving. Tolerating solid food and having soft BMs which are non-bloody. She no longer feels dizzy and has been able to ambulate down the hall.   Brief Summary: Cheryl Barnes a 66 y.o.femalewith medical history significant of diverticulosis, essential hypertension, left bundle branch block, and urinary incontinence who presents with left lower abdominal pain and dizziness. She has had diverticulitis in the past and this episode feels the same as her previous. CT scan confirms diverticulitis.     Hospital Course:  Principal Problem:   Acute diverticulitis with dehydration - of descending and sigmoid colon - dizziness has been coming and going and has resolved as of yesterday - she has nausea for which I have added Zofran Q4 hrs (nausea may be due to Flagyl as it became worse in the hospital) - cont Cipro and Flagyl- I will transition to oral today for 1 more week-  symptoms are improving - she has had a number of episodes of sigmoid diverticulitis in the past and at one point it was recommended that she have surgery to excise the localized area - she has subsequently been free of diverticulitis and this is the first episode of recurrence in 2 years - she will f/u with her gastroenterologist for further discussion in regards to whether a more permanent treatment is needed   Active Problems:    Essential hypertension - amlodipine, Coreg  Mild thrombocytopenia - ? Due to infection- has improved from 122 to 149 as of today   Chest pain - likely related to abdominal issues - CTA of chest shows no pulmonary abnormalitis  Thyroid nodule - noted incidentally on above CTA- needs outpt w/u   ? Sclerotic lesion on T7 - bone scan as outpt   Discharge Instructions   Allergies as of 01/27/2017      Reactions   Seasonal Ic [cholestatin] Other (See Comments)   Upper resp, sore throat, sneezing, etc.   Tape Other (See Comments)   Tape causes some redness & causes BLISTERS if left on for too long   Latex Itching, Rash   Penicillins Itching, Rash   Has patient had a PCN reaction causing immediate rash, facial/tongue/throat swelling, SOB or lightheadedness with hypotension: Yes Has patient had a PCN reaction causing severe rash involving mucus membranes or skin necrosis: No Has patient had a PCN reaction that required hospitalization No Has patient had a PCN reaction occurring within the last 10 years: No If all of the above answers are "NO", then may proceed with Cephalosporin use.      Medication List    TAKE these medications   amLODipine 5 MG tablet Commonly known as:  NORVASC Take 1 tablet (5 mg total) by mouth daily.   CALCIUM + D PO Take 600 mg by mouth 2 (two) times daily.   carvedilol 3.125 MG tablet Commonly known as:  COREG Take 1 tablet (3.125 mg total) by  mouth 2 (two) times daily with a meal.   ciprofloxacin 500 MG tablet Commonly known as:  CIPRO Take 1 tablet (500 mg total) by mouth 2 (two) times daily.   COLACE PO Take 100 mg by mouth daily as needed (to prevent hard stools). What changed:  Another medication with the same name was changed. Make sure you understand how and when to take each.   docusate sodium 100 MG capsule Commonly known as:  COLACE Take 1 capsule (100 mg total) by mouth 2 (two) times daily. What changed:  when to take this    loratadine 10 MG tablet Commonly known as:  CLARITIN Take 10 mg by mouth daily.   metroNIDAZOLE 500 MG tablet Commonly known as:  FLAGYL Take 1 tablet (500 mg total) by mouth 3 (three) times daily.   ondansetron 4 MG disintegrating tablet Commonly known as:  ZOFRAN ODT Take 1 tablet (4 mg total) by mouth every 8 (eight) hours as needed for nausea or vomiting.   oxyCODONE 5 MG immediate release tablet Commonly known as:  Oxy IR/ROXICODONE Take 1 tablet (5 mg total) by mouth every 6 (six) hours as needed for moderate pain.   valACYclovir 1000 MG tablet Commonly known as:  VALTREX TAKE 2 TABLETS BY MOUTH AS NEEDED UPON ACUTE ONSET AND ASAP AND REPEAT DOSE IN 12 HOURS      Follow-up Information    [name], [name], MD Follow up.   Specialty:  Family Medicine Why:  follow up on Thursday Contact information: [address] [place]  [phone]          Allergies  Allergen Reactions  . Seasonal Ic [Cholestatin] Other (See Comments)    Upper resp, sore throat, sneezing, etc.  . Tape Other (See Comments)    Tape causes some redness & causes BLISTERS if left on for too long  . Latex Itching and Rash  . Penicillins Itching and Rash    Has patient had a PCN reaction causing immediate rash, facial/tongue/throat swelling, SOB or lightheadedness with hypotension: Yes Has patient had a PCN reaction causing severe rash involving mucus membranes or skin necrosis: No Has patient had a PCN reaction that required hospitalization No Has patient had a PCN reaction occurring within the last 10 years: No If all of the above answers are "NO", then may proceed with Cephalosporin use.      Procedures/Studies:   Dg Chest 2 View  Result Date: 01/24/2017 CLINICAL DATA:  Chest pain extending down both arms.  DVT. EXAM: CHEST  2 VIEW COMPARISON:  06/09/2014. FINDINGS: Normal sized heart. Clear lungs with normal vascularity. Unremarkable bones. IMPRESSION: No acute  abnormality. Electronically Signed   By: Claudie Revering M.D.   On: 01/24/2017 18:48   Ct Angio Chest Pe W Or Wo Contrast  Result Date: 01/25/2017 CLINICAL DATA:  Acute onset of mid chest pain and tightness. Initial encounter. EXAM: CT ANGIOGRAPHY CHEST WITH CONTRAST TECHNIQUE: Multidetector CT imaging of the chest was performed using the standard protocol during bolus administration of intravenous contrast. Multiplanar CT image reconstructions and MIPs were obtained to evaluate the vascular anatomy. CONTRAST:  100 mL of Isovue 370 IV contrast COMPARISON:  Chest radiograph performed 01/24/2017 FINDINGS: Cardiovascular:  There is no evidence of pulmonary embolus. The heart is normal in size. The thoracic aorta is grossly unremarkable. The great vessels are within normal limits. Mediastinum/Nodes: Visualized mediastinal nodes remain normal in size. No pericardial effusion is identified. A 2.3 cm heterogeneous mass is noted  arising at the posterior aspect of the right thyroid lobe. No axillary lymphadenopathy is appreciated. Lungs/Pleura: Bibasilar atelectasis is noted, with mild peripheral scarring. No pleural effusion or pneumothorax is seen. No masses are identified. Upper Abdomen: The visualized portions of the liver and spleen are grossly unremarkable. A tiny hiatal hernia is noted. The visualized portions of the pancreas and adrenal glands are within normal limits. Musculoskeletal: There is question of a vague sclerotic lesion at the left side of vertebral body T7, of uncertain significance. The visualized musculature is unremarkable in appearance. Review of the MIP images confirms the above findings. IMPRESSION: 1. No evidence of pulmonary embolus. 2. Bibasilar atelectasis, with mild peripheral scarring. 3. **An incidental finding of potential clinical significance has been found. 2.3 cm heterogeneous mass arising at the posterior aspect of the right thyroid lobe. Recommend further evaluation with thyroid  ultrasound. If patient is clinically hyperthyroid, consider nuclear medicine thyroid uptake and scan.** 4. Question of vague sclerotic lesion at the left-sided vertebral body T7, of uncertain significance. Bone scan would be helpful for further evaluation, when and as deemed clinically appropriate. Electronically Signed   By: Garald Balding M.D.   On: 01/25/2017 01:18   Ct Abdomen Pelvis W Contrast  Result Date: 01/24/2017 CLINICAL DATA:  Abdominal bloating and sharp pain EXAM: CT ABDOMEN AND PELVIS WITH CONTRAST TECHNIQUE: Multidetector CT imaging of the abdomen and pelvis was performed using the standard protocol following bolus administration of intravenous contrast. CONTRAST:  56mL ISOVUE-300 IOPAMIDOL (ISOVUE-300) INJECTION 61%, 160mL ISOVUE-300 IOPAMIDOL (ISOVUE-300) INJECTION 61% COMPARISON:  05/08/2016 FINDINGS: Lower chest: Lung bases demonstrate no acute consolidation or pleural effusion. Borderline cardiomegaly. Hepatobiliary: Minimal central intra hepatic biliary dilatation, unchanged. Surgical absence of the gallbladder. Pancreas: Unremarkable. No pancreatic ductal dilatation or surrounding inflammatory changes. Spleen: Normal in size without focal abnormality. Adrenals/Urinary Tract: Adrenal glands are within normal limits. 4.9 cm cyst upper pole left kidney. Bladder normal. Stomach/Bowel: The stomach is nonenlarged. No dilated small bowel. Normal appendix. Focal wall thickening and inflammatory change at the distal descending/ proximal sigmoid colon, consistent with acute diverticulitis. No abscess. Vascular/Lymphatic: Aortic atherosclerosis. Stable 1.5 cm splenic artery aneurysm. No enlarged lymph nodes Reproductive: Status post hysterectomy. No adnexal masses. Other: No free air or free fluid. Musculoskeletal: No acute or suspicious bone lesion. IMPRESSION: 1. Findings consistent with acute diverticulitis of the distal descending/proximal sigmoid colon. No perforation or abscess. 2. Stable 1.5  cm splenic artery aneurysm. Electronically Signed   By: Donavan Foil M.D.   On: 01/24/2017 21:24       Discharge Exam: Vitals:   01/27/17 0602 01/27/17 1010  BP: (!) 105/56 125/77  Pulse: 63 64  Resp: 18   Temp: 98 F (36.7 C)    Vitals:   01/26/17 1300 01/26/17 2035 01/27/17 0602 01/27/17 1010  BP: 125/78 130/65 (!) 105/56 125/77  Pulse: 77 73 63 64  Resp: 18 17 18    Temp: 98.2 F (36.8 C) 98.4 F (36.9 C) 98 F (36.7 C)   TempSrc: Oral Oral Oral   SpO2: 97% 97% 93% 97%  Weight:      Height:        General: Pt is alert, awake, not in acute distress Cardiovascular: RRR, S1/S2 +, no rubs, no gallops Respiratory: CTA bilaterally, no wheezing, no rhonchi Abdominal: Soft, NT, ND, bowel sounds + Extremities: no edema, no cyanosis    The results of significant diagnostics from this hospitalization (including imaging, microbiology, ancillary and laboratory) are listed below for reference.  Microbiology: No results found for this or any previous visit (from the past 240 hour(s)).   Labs: BNP (last 3 results) No results for input(s): BNP in the last 8760 hours. Basic Metabolic Panel:  Recent Labs Lab 01/24/17 1900 01/25/17 0546 01/26/17 0615  NA 140 140 141  K 3.8 4.2 3.9  CL 108 109 110  CO2 24 25 26   GLUCOSE 102* 132* 91  BUN 17 11 7   CREATININE 0.77 0.58 0.65  CALCIUM 9.2 8.6* 8.6*   Liver Function Tests:  Recent Labs Lab 01/24/17 1900  AST 19  ALT 20  ALKPHOS 94  BILITOT 0.7  PROT 7.9  ALBUMIN 4.4   No results for input(s): LIPASE, AMYLASE in the last 168 hours. No results for input(s): AMMONIA in the last 168 hours. CBC:  Recent Labs Lab 01/24/17 1900 01/25/17 0546 01/26/17 0615 01/27/17 1011  WBC 10.2 7.5 4.8 4.1  HGB 13.9 12.1 11.5* 13.2  HCT 41.3 36.7 35.1* 39.5  MCV 91.0 91.3 91.9 91.4  PLT 160 125* 122* 149*   Cardiac Enzymes: No results for input(s): CKTOTAL, CKMB, CKMBINDEX, TROPONINI in the last 168  hours. BNP: Invalid input(s): POCBNP CBG: No results for input(s): GLUCAP in the last 168 hours. D-Dimer No results for input(s): DDIMER in the last 72 hours. Hgb A1c No results for input(s): HGBA1C in the last 72 hours. Lipid Profile No results for input(s): CHOL, HDL, LDLCALC, TRIG, CHOLHDL, LDLDIRECT in the last 72 hours. Thyroid function studies No results for input(s): TSH, T4TOTAL, T3FREE, THYROIDAB in the last 72 hours.  Invalid input(s): FREET3 Anemia work up No results for input(s): VITAMINB12, FOLATE, FERRITIN, TIBC, IRON, RETICCTPCT in the last 72 hours. Urinalysis    Component Value Date/Time   COLORURINE YELLOW 01/24/2017 1903   APPEARANCEUR CLEAR 01/24/2017 1903   LABSPEC 1.044 (H) 01/24/2017 1903   PHURINE 6.0 01/24/2017 1903   GLUCOSEU NEGATIVE 01/24/2017 1903   GLUCOSEU NEGATIVE 03/14/2007 0945   HGBUR NEGATIVE 01/24/2017 1903   HGBUR negative 03/10/2010 0819   BILIRUBINUR NEGATIVE 01/24/2017 1903   BILIRUBINUR n 08/29/2016 0942   KETONESUR 5 (A) 01/24/2017 1903   PROTEINUR NEGATIVE 01/24/2017 1903   UROBILINOGEN 0.2 08/29/2016 0942   UROBILINOGEN 0.2 12/18/2014 1708   NITRITE NEGATIVE 01/24/2017 1903   LEUKOCYTESUR NEGATIVE 01/24/2017 1903   Sepsis Labs Invalid input(s): PROCALCITONIN,  WBC,  LACTICIDVEN Microbiology No results found for this or any previous visit (from the past 240 hour(s)).   Time coordinating discharge: Over 30 minutes  SIGNED:   Debbe Odea, MD  [facility] 01/27/2017, 12:57 PM Pager   If 7PM-7AM, please contact night-coverage [email or web address] Password [email or web address]

## 2017-01-27 NOTE — Progress Notes (Signed)
Patient discharged home.  Leaving with personal belongings and four prescriptions.  Reports understanding of discharge instructions.  Room air, denies pain, no s/s of distress.  Accompanied by brother.  No complaints.

## 2017-01-27 NOTE — Discharge Instructions (Signed)

## 2017-01-29 ENCOUNTER — Telehealth: Payer: Self-pay

## 2017-01-29 NOTE — Telephone Encounter (Signed)
LMTCB

## 2017-01-29 NOTE — Progress Notes (Signed)
HPI:   Cheryl Barnes is a 66 y.o. female, who is here today to follow on recent hospitalization.  She was admitted on  01/24/17 because LLQ abdominal pain and dizziness. Dx with diverticulitis of descending and sigmoid colon and dehydration.Marland Kitchen She was discharged home on 01/27/17 on Cipro and Flagyl to take for a week, she is on her 3rd day. She is tolerating abx well, she has Zofran in case she develops nausea.  Mild thrombocytopenia on admission, improved before discharged.   Lab Results  Component Value Date   WBC 4.1 01/27/2017   HGB 13.2 01/27/2017   HCT 39.5 01/27/2017   MCV 91.4 01/27/2017   PLT 149 (L) 01/27/2017   CXR on 01/24/17: No acute abnormality.  Abd/pelvic CT 01/24/17: 1. Findings consistent with acute diverticulitis of the distal descending/proximal sigmoid colon. No perforation or abscess. 2. Stable 1.5 cm splenic artery aneurysm.  Chest CTA 01/25/17: 1. No evidence of pulmonary embolus. 2. Bibasilar atelectasis, with mild peripheral scarring. 3. An incidental finding of potential clinical significance has been found. 2.3 cm heterogeneous mass arising at the posterior aspect of the right thyroid lobe. Recommend further evaluation with thyroid ultrasound. If patient is clinically hyperthyroid, consider nuclear medicine thyroid uptake and scan. 4. Question of vague sclerotic lesion at the left-sided vertebral body T7, of uncertain significance. Bone scan would be helpful for further evaluation, when and as deemed clinically appropriate.  Lab Results  Component Value Date   TSH 1.12 08/29/2016     Chemistry      Component Value Date/Time   NA 141 01/26/2017 0615   K 3.9 01/26/2017 0615   CL 110 01/26/2017 0615   CO2 26 01/26/2017 0615   BUN 7 01/26/2017 0615   CREATININE 0.65 01/26/2017 0615      Component Value Date/Time   CALCIUM 8.6 (L) 01/26/2017 0615   ALKPHOS 94 01/24/2017 1900   AST 19 01/24/2017 1900   ALT 20 01/24/2017 1900   BILITOT  0.7 01/24/2017 1900      She is aware of abnormalities found on imaging. She denies thoracic back pain.  Overall she is feeling better, abdominal pain is now mild, constant, exacerbated by palpation and certain movements. Appetite is back to normal. Mild fatigue. She feels like she is not yet at her baseline but progressively getting closer. She has not had nausea for the past 2-3 days. Last bowel movement was today morning, more formed stool, she has not seen blood in stool.   New concern:  For the past couple days she has noted right TMJ pain, intermittent, dull pain, 3-4/10. Pain lasts about 3 min, she has not identified exacerbating or alleviating factors. No limitation of TMJ movement. No associated sore throat, toothache, or earache. She has not tried any treatment.   Review of Systems  Constitutional: Positive for fatigue. Negative for chills and fever.  HENT: Negative for dental problem, ear pain, facial swelling, mouth sores, sinus pain, sore throat and trouble swallowing.   Respiratory: Negative for cough, shortness of breath and wheezing.   Cardiovascular: Negative for palpitations and leg swelling.  Gastrointestinal: Positive for abdominal pain. Negative for blood in stool, nausea and vomiting.  Endocrine: Negative for cold intolerance and heat intolerance.  Genitourinary: Negative for decreased urine volume, dysuria and hematuria.  Musculoskeletal: Positive for arthralgias. Negative for back pain, gait problem and myalgias.  Skin: Negative for pallor and rash.  Neurological: Negative for syncope, weakness and headaches.  Hematological: Negative  for adenopathy. Does not bruise/bleed easily.  Psychiatric/Behavioral: Negative for confusion and sleep disturbance.      Current Outpatient Prescriptions on File Prior to Visit  Medication Sig Dispense Refill  . amLODipine (NORVASC) 5 MG tablet Take 1 tablet (5 mg total) by mouth daily. 90 tablet 1  . Calcium  Citrate-Vitamin D (CALCIUM + D PO) Take 600 mg by mouth 2 (two) times daily.    . carvedilol (COREG) 3.125 MG tablet Take 1 tablet (3.125 mg total) by mouth 2 (two) times daily with a meal. 180 tablet 1  . ciprofloxacin (CIPRO) 500 MG tablet Take 1 tablet (500 mg total) by mouth 2 (two) times daily. 14 tablet 0  . Docusate Sodium (COLACE PO) Take 100 mg by mouth daily as needed (to prevent hard stools).     Marland Kitchen docusate sodium (COLACE) 100 MG capsule Take 1 capsule (100 mg total) by mouth 2 (two) times daily. (Patient taking differently: Take 100 mg by mouth daily. ) 30 capsule 0  . loratadine (CLARITIN) 10 MG tablet Take 10 mg by mouth daily.     . metroNIDAZOLE (FLAGYL) 500 MG tablet Take 1 tablet (500 mg total) by mouth 3 (three) times daily. 21 tablet 0  . ondansetron (ZOFRAN ODT) 4 MG disintegrating tablet Take 1 tablet (4 mg total) by mouth every 8 (eight) hours as needed for nausea or vomiting. 20 tablet 0  . oxyCODONE (OXY IR/ROXICODONE) 5 MG immediate release tablet Take 1 tablet (5 mg total) by mouth every 6 (six) hours as needed for moderate pain. 10 tablet 0  . valACYclovir (VALTREX) 1000 MG tablet TAKE 2 TABLETS BY MOUTH AS NEEDED UPON ACUTE ONSET AND ASAP AND REPEAT DOSE IN 12 HOURS 16 tablet 0   No current facility-administered medications on file prior to visit.      Past Medical History:  Diagnosis Date  . Allergy   . Diverticulitis   . Hypertension   . LBBB (left bundle branch block)   . Migraine    history   Allergies  Allergen Reactions  . Seasonal Ic [Cholestatin] Other (See Comments)    Upper resp, sore throat, sneezing, etc.  . Tape Other (See Comments)    Tape causes some redness & causes BLISTERS if left on for too long  . Latex Itching and Rash  . Penicillins Itching and Rash    Has patient had a PCN reaction causing immediate rash, facial/tongue/throat swelling, SOB or lightheadedness with hypotension: Yes Has patient had a PCN reaction causing severe rash  involving mucus membranes or skin necrosis: No Has patient had a PCN reaction that required hospitalization No Has patient had a PCN reaction occurring within the last 10 years: No If all of the above answers are "NO", then may proceed with Cephalosporin use.     Social History   Social History  . Marital status: Single    Spouse name: N/A  . Number of children: N/A  . Years of education: N/A   Occupational History  . ophthalmology @ [employer]  . Smoking status: Never Smoker  . Smokeless tobacco: Never Used  . Alcohol use No  . Drug use: No  . Sexual activity: No   Other Topics Concern  . None   Social History Narrative  . None    Vitals:   01/30/17 0926  BP: 130/80  Pulse: 63  Resp: 12  Temp: 98.1 F (36.7 C)  O2 sat at RA  94% Body mass index is 28.91 kg/m.    Physical Exam  Nursing note and vitals reviewed. Constitutional: She is oriented to person, place, and time. She appears well-developed. She does not appear ill. No distress.  HENT:  Head: Atraumatic.  Right Ear: Hearing, tympanic membrane, external ear and ear canal normal.  Nose: Right sinus exhibits no maxillary sinus tenderness and no frontal sinus tenderness.  Mouth/Throat: Uvula is midline, oropharynx is clear and moist and mucous membranes are normal.  Eyes: Conjunctivae and EOM are normal.  Neck: No tracheal deviation present. No thyromegaly (palpable) present.  Cardiovascular: Normal rate and regular rhythm.   No murmur heard. Respiratory: Effort normal and breath sounds normal. No respiratory distress.  GI: Soft. Bowel sounds are normal. She exhibits no distension and no mass. There is no hepatomegaly. There is tenderness in the left lower quadrant. There is no rigidity, no rebound and no guarding.  Musculoskeletal: She exhibits no edema.       Thoracic back: She exhibits no tenderness and no bony tenderness.  Lymphadenopathy:       Head (right  side): No submandibular, no preauricular and no posterior auricular adenopathy present.    She has no cervical adenopathy.  Right submandibular gland mildly enlarged, no tender.  Neurological: She is alert and oriented to person, place, and time. She has normal strength. No cranial nerve deficit. Gait normal.  Skin: Skin is warm. No rash noted. No erythema.  Psychiatric: She has a normal mood and affect.  Well groomed, good eye contact.     ASSESSMENT AND PLAN:   Levina was seen today for hospitalization follow-up.  Diagnoses and all orders for this visit:  Acute diverticulitis of intestine  Improved. Complete abx treatment. She is planning on following with her GI. Instructed about warning signs. F/U in 3-4 weeks.   Thyroid nodule  Incidental finding on chest CT. Further recommendations will be given according to lab and imaging results.  -     TSH; Future -     US THYROID; Future  Thrombocytopenia (HCC)  Mild and asymptomatic. CBC in 2 weeks.  -     CBC with Differential/Platelet; Future  Arthralgia of right temporomandibular joint  We discussed possible causes, ? TMJ synd, TMJ OA, ENT etiology among some. For now she agrees with monitoring. F/U in 3-4 weeks, before if needed.  Bony sclerosis  We discussed chest CT incidental findings, vague sclerotic lesion at the left-sided vertebral body T7. We discussed possible etiologies including malignancy/metastasis. She would like to hold on bone scan for now. She will let me know is she starts with any pain.      [name]. [name], MD  [facility]. [place] office.

## 2017-01-29 NOTE — Telephone Encounter (Signed)
D/C 01/27/17 To: home  Spoke with pt and she states that she is doing well. She does have some minor abdominal pain and occasional chest pain. These both have improved since admission. She is taking all medications as directed. She has no other questions or concerns at this time.  Appt scheduled with Dr. [name] 01/30/17, pt aware.   Transition Care Management Follow-up Telephone Call  How have you been since you were released from the hospital? Improving, still some residual pain   Do you understand why you were in the hospital? yes   Do you understand the discharge instrcutions? yes  Items Reviewed:  Medications reviewed: yes  Allergies reviewed: yes  Dietary changes reviewed: yes  Referrals reviewed: yes   Functional Questionnaire:   Activities of Daily Living (ADLs):   She states they are independent in the following: ambulation, bathing and hygiene, feeding, continence, grooming, toileting and dressing States they require assistance with the following: none   Any transportation issues/concerns?: no   Any patient concerns? no   Confirmed importance and date/time of follow-up visits scheduled: yes   Confirmed with patient if condition begins to worsen call PCP or go to the ER.  Patient was given the Call-a-Nurse line [phone]: yes

## 2017-01-30 ENCOUNTER — Encounter: Payer: Self-pay | Admitting: Family Medicine

## 2017-01-30 ENCOUNTER — Ambulatory Visit (INDEPENDENT_AMBULATORY_CARE_PROVIDER_SITE_OTHER): Payer: Medicare Other | Admitting: Family Medicine

## 2017-01-30 VITALS — BP 130/80 | HR 63 | Temp 98.1°F | Resp 12 | Ht 61.0 in | Wt 153.0 lb

## 2017-01-30 DIAGNOSIS — K5792 Diverticulitis of intestine, part unspecified, without perforation or abscess without bleeding: Secondary | ICD-10-CM

## 2017-01-30 DIAGNOSIS — E041 Nontoxic single thyroid nodule: Secondary | ICD-10-CM | POA: Diagnosis not present

## 2017-01-30 DIAGNOSIS — Q782 Osteopetrosis: Secondary | ICD-10-CM

## 2017-01-30 DIAGNOSIS — D696 Thrombocytopenia, unspecified: Secondary | ICD-10-CM

## 2017-01-30 DIAGNOSIS — M26621 Arthralgia of right temporomandibular joint: Secondary | ICD-10-CM | POA: Diagnosis not present

## 2017-01-30 NOTE — Patient Instructions (Addendum)
A few things to remember from today's visit:   Thyroid nodule - Plan: TSH, US THYROID  Thrombocytopenia ([place]) - Plan: CBC with Differential/Platelet  Acute diverticulitis of intestine  Complete antibiotic treatment. Avoid constipation.    Please be sure medication list is accurate. If a new problem present, please set up appointment sooner than planned today.

## 2017-02-07 DIAGNOSIS — M65331 Trigger finger, right middle finger: Secondary | ICD-10-CM | POA: Diagnosis not present

## 2017-02-13 ENCOUNTER — Other Ambulatory Visit (INDEPENDENT_AMBULATORY_CARE_PROVIDER_SITE_OTHER): Payer: Medicare Other

## 2017-02-13 ENCOUNTER — Other Ambulatory Visit: Payer: Self-pay | Admitting: Family Medicine

## 2017-02-13 ENCOUNTER — Encounter: Payer: Self-pay | Admitting: Family Medicine

## 2017-02-13 DIAGNOSIS — D696 Thrombocytopenia, unspecified: Secondary | ICD-10-CM

## 2017-02-13 DIAGNOSIS — B001 Herpesviral vesicular dermatitis: Secondary | ICD-10-CM

## 2017-02-13 DIAGNOSIS — E041 Nontoxic single thyroid nodule: Secondary | ICD-10-CM

## 2017-02-13 LAB — CBC WITH DIFFERENTIAL/PLATELET
BASOS PCT: 0.5 % (ref 0.0–3.0)
Basophils Absolute: 0 10*3/uL (ref 0.0–0.1)
EOS ABS: 0.1 10*3/uL (ref 0.0–0.7)
EOS PCT: 1.7 % (ref 0.0–5.0)
HCT: 39.5 % (ref 36.0–46.0)
HEMOGLOBIN: 13.3 g/dL (ref 12.0–15.0)
Lymphocytes Relative: 37.4 % (ref 12.0–46.0)
Lymphs Abs: 2 10*3/uL (ref 0.7–4.0)
MCHC: 33.7 g/dL (ref 30.0–36.0)
MCV: 90.7 fl (ref 78.0–100.0)
MONOS PCT: 11.3 % (ref 3.0–12.0)
Monocytes Absolute: 0.6 10*3/uL (ref 0.1–1.0)
Neutro Abs: 2.7 10*3/uL (ref 1.4–7.7)
Neutrophils Relative %: 49.1 % (ref 43.0–77.0)
Platelets: 197 10*3/uL (ref 150.0–400.0)
RBC: 4.36 Mil/uL (ref 3.87–5.11)
RDW: 12.9 % (ref 11.5–15.5)
WBC: 5.5 10*3/uL (ref 4.0–10.5)

## 2017-02-13 LAB — TSH: TSH: 0.24 u[IU]/mL — ABNORMAL LOW (ref 0.35–4.50)

## 2017-02-18 ENCOUNTER — Ambulatory Visit
Admission: RE | Admit: 2017-02-18 | Discharge: 2017-02-18 | Disposition: A | Discharge status: Home / Self Care | Payer: Medicare Other | Source: Ambulatory Visit | Attending: Family Medicine | Admitting: Family Medicine

## 2017-02-18 DIAGNOSIS — E041 Nontoxic single thyroid nodule: Secondary | ICD-10-CM | POA: Diagnosis not present

## 2017-02-20 ENCOUNTER — Encounter: Payer: Self-pay | Admitting: Family Medicine

## 2017-02-28 NOTE — Progress Notes (Signed)
HPI:   [name] is a 66 y.o. female, who is here today to follow on last OV, when she was seen after hospital discharge, acute diverticulitis. She completed abx treatment. She is reporting being back to her baseline, still has "a little sporadic" LLQ pain, she states that this she has  Intermittently for a while. She denies fever, chills, nausea, vomiting, diarrhea/constipation, blood in stool, or urinary symptoms. Last bowel movement this morning. She given arranged appointment with her GI, she does not think she needs it at this time.  Last OV she was also c/o right TMJ arthralgia. Pain has resolved.   Thyroid nodules was found incidentally on chest CT.  Lab Results  Component Value Date   TSH 0.24 (L) 02/13/2017   Thyroid US 02/19/17: The gland is markedly heterogeneous. Small nodules an a simple cyst do not meet criteria for follow-up or biopsy.   She has not noted dysphagia, palpitations, abdominal pain, changes in bowel habits, tremor, cold/heat intolerance, or abnormal weight loss.  HTN: Currently she is on Carvedilol 3.125 mg bid and Amlodipine 5 mg daily. Denies severe/frequent headache, visual changes, chest pain, dyspnea, palpitation, claudication, focal weakness, or edema. Lab Results  Component Value Date   CREATININE 0.65 01/26/2017   BUN 7 01/26/2017   NA 141 01/26/2017   K 3.9 01/26/2017   CL 110 01/26/2017   CO2 26 01/26/2017    No new concerns today.   Review of Systems  Constitutional: Positive for fatigue (at her baseline.). Negative for activity change, appetite change, fever and unexpected weight change.  HENT: Negative for mouth sores, nosebleeds and trouble swallowing.   Eyes: Negative for redness and visual disturbance.  Respiratory: Negative for shortness of breath and wheezing.   Cardiovascular: Negative for chest pain, palpitations and leg swelling.  Gastrointestinal: Negative for abdominal distention, blood in stool, nausea  and vomiting.       Negative for changes in bowel habits.  Endocrine: Negative for cold intolerance and heat intolerance.  Genitourinary: Negative for decreased urine volume, dysuria and hematuria.  Neurological: Negative for syncope, weakness and headaches.  Psychiatric/Behavioral: Negative for confusion and sleep disturbance.      Current Outpatient Prescriptions on File Prior to Visit  Medication Sig Dispense Refill  . amLODipine (NORVASC) 5 MG tablet Take 1 tablet (5 mg total) by mouth daily. 90 tablet 1  . Calcium Citrate-Vitamin D (CALCIUM + D PO) Take 600 mg by mouth 2 (two) times daily.    . carvedilol (COREG) 3.125 MG tablet Take 1 tablet (3.125 mg total) by mouth 2 (two) times daily with a meal. 180 tablet 1  . Docusate Sodium (COLACE PO) Take 100 mg by mouth daily as needed (to prevent hard stools).     Marland Kitchen docusate sodium (COLACE) 100 MG capsule Take 1 capsule (100 mg total) by mouth 2 (two) times daily. (Patient taking differently: Take 100 mg by mouth daily. ) 30 capsule 0  . loratadine (CLARITIN) 10 MG tablet Take 10 mg by mouth daily.     . valACYclovir (VALTREX) 1000 MG tablet TAKE 2 TABLETS BY MOUTH AS NEEDED UPON ACUTE ONSET AND ASAP AND REPEAT DOSE IN 12 HOURS 16 tablet 0   No current facility-administered medications on file prior to visit.      Past Medical History:  Diagnosis Date  . Allergy   . Diverticulitis   . Hypertension   . LBBB (left bundle branch block)   . Migraine  history   Allergies  Allergen Reactions  . Seasonal Ic [Cholestatin] Other (See Comments)    Upper resp, sore throat, sneezing, etc.  . Tape Other (See Comments)    Tape causes some redness & causes BLISTERS if left on for too long  . Latex Itching and Rash  . Penicillins Itching and Rash    Has patient had a PCN reaction causing immediate rash, facial/tongue/throat swelling, SOB or lightheadedness with hypotension: Yes Has patient had a PCN reaction causing severe rash involving  mucus membranes or skin necrosis: No Has patient had a PCN reaction that required hospitalization No Has patient had a PCN reaction occurring within the last 10 years: No If all of the above answers are "NO", then may proceed with Cephalosporin use.     Social History   Social History  . Marital status: Single    Spouse name: N/A  . Number of children: N/A  . Years of education: N/A   Occupational History  . ophthalmology @ [employer]  . Smoking status: Never Smoker  . Smokeless tobacco: Never Used  . Alcohol use No  . Drug use: No  . Sexual activity: No   Other Topics Concern  . None   Social History Narrative  . None    Vitals:   03/01/17 0721  BP: 130/70  Pulse: 73  Resp: 12   Body mass index is 29.18 kg/m.   Physical Exam  Nursing note and vitals reviewed. Constitutional: She is oriented to person, place, and time. She appears well-developed. No distress.  HENT:  Head: Atraumatic.  Mouth/Throat: Oropharynx is clear and moist and mucous membranes are normal.  Eyes: Conjunctivae and EOM are normal. Pupils are equal, round, and reactive to light.  Neck: No tracheal deviation present. No thyroid mass and no thyromegaly (palpable) present.  Cardiovascular: Normal rate and regular rhythm.   No murmur heard. Respiratory: Effort normal and breath sounds normal. No respiratory distress.  GI: Soft. Bowel sounds are normal. She exhibits no mass. There is no hepatomegaly. There is tenderness (just upon deep palpation.) in the left lower quadrant. There is no rigidity, no rebound and no guarding.  Musculoskeletal: She exhibits no edema.  Lymphadenopathy:    She has no cervical adenopathy.  Neurological: She is alert and oriented to person, place, and time. She has normal strength. Gait normal.  Skin: Skin is warm. No erythema.  Psychiatric: She has a normal mood and affect.  Well groomed, good eye contact.    ASSESSMENT AND  PLAN:   [name] was seen today for follow-up.  Diagnoses and all orders for this visit:  Abnormal TSH  We review thyroid US report. Further recommendations will be given according to lab results.  -     T3, free -     T4, free -     TSH  Essential hypertension  Well controlled. No changes in current management. Continue low salt diet. Follow-up in 6 months.  Diverticulosis of colon  Acute episode of diverticulitis resolved. We discussed management options if episodes become recurrent. Clearly instructed about warning signs.  Splenic artery aneurysm ([facility])  We discussed abdominal CT findings, stable 1.5 cm splenic artery aneurysm. Aneurysm has been stable when compared with prior studies. At this time I don't think further studies are needed.  Instructed about warning signs.    -[name] was advised to return sooner than planned today if new concerns arise.  Alixandria Friedt G. [name], MD  [facility]. [place] office.

## 2017-03-01 ENCOUNTER — Encounter: Payer: Self-pay | Admitting: Family Medicine

## 2017-03-01 ENCOUNTER — Ambulatory Visit (INDEPENDENT_AMBULATORY_CARE_PROVIDER_SITE_OTHER): Payer: Medicare Other | Admitting: Family Medicine

## 2017-03-01 VITALS — BP 130/70 | HR 73 | Resp 12 | Ht 61.0 in | Wt 154.4 lb

## 2017-03-01 DIAGNOSIS — R946 Abnormal results of thyroid function studies: Secondary | ICD-10-CM | POA: Diagnosis not present

## 2017-03-01 DIAGNOSIS — I10 Essential (primary) hypertension: Secondary | ICD-10-CM

## 2017-03-01 DIAGNOSIS — K5730 Diverticulosis of large intestine without perforation or abscess without bleeding: Secondary | ICD-10-CM | POA: Diagnosis not present

## 2017-03-01 DIAGNOSIS — I728 Aneurysm of other specified arteries: Secondary | ICD-10-CM | POA: Diagnosis not present

## 2017-03-01 DIAGNOSIS — R7989 Other specified abnormal findings of blood chemistry: Secondary | ICD-10-CM

## 2017-03-01 LAB — TSH: TSH: 0.74 u[IU]/mL (ref 0.35–4.50)

## 2017-03-01 LAB — T3, FREE: T3 FREE: 3.4 pg/mL (ref 2.3–4.2)

## 2017-03-01 LAB — T4, FREE: Free T4: 0.97 ng/dL (ref 0.60–1.60)

## 2017-03-01 NOTE — Patient Instructions (Addendum)
A few things to remember from today's visit:   Abnormal TSH - Plan: T3, free, T4, free, TSH  Essential hypertension   Please be sure medication list is accurate. If a new problem present, please set up appointment sooner than planned today.

## 2017-03-07 DIAGNOSIS — M65331 Trigger finger, right middle finger: Secondary | ICD-10-CM | POA: Diagnosis not present

## 2017-03-14 DIAGNOSIS — D229 Melanocytic nevi, unspecified: Secondary | ICD-10-CM | POA: Diagnosis not present

## 2017-03-14 DIAGNOSIS — L821 Other seborrheic keratosis: Secondary | ICD-10-CM | POA: Diagnosis not present

## 2017-03-14 DIAGNOSIS — L570 Actinic keratosis: Secondary | ICD-10-CM | POA: Diagnosis not present

## 2017-04-23 DIAGNOSIS — R635 Abnormal weight gain: Secondary | ICD-10-CM | POA: Diagnosis not present

## 2017-04-23 DIAGNOSIS — N951 Menopausal and female climacteric states: Secondary | ICD-10-CM | POA: Diagnosis not present

## 2017-04-30 ENCOUNTER — Other Ambulatory Visit: Payer: Self-pay | Admitting: Family Medicine

## 2017-04-30 DIAGNOSIS — B001 Herpesviral vesicular dermatitis: Secondary | ICD-10-CM

## 2017-05-01 ENCOUNTER — Telehealth: Payer: Self-pay | Admitting: Family Medicine

## 2017-05-01 NOTE — Telephone Encounter (Signed)
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 Call pt [phone] or [phone] when ready so she can pick them up. Pt states deadline to turn in forms is 05/06/17. cb

## 2017-05-01 NOTE — Telephone Encounter (Signed)
Forms placed on MD's desk to be completed

## 2017-05-02 DIAGNOSIS — M2550 Pain in unspecified joint: Secondary | ICD-10-CM | POA: Diagnosis not present

## 2017-05-02 DIAGNOSIS — E663 Overweight: Secondary | ICD-10-CM | POA: Diagnosis not present

## 2017-05-02 DIAGNOSIS — E039 Hypothyroidism, unspecified: Secondary | ICD-10-CM | POA: Diagnosis not present

## 2017-05-02 DIAGNOSIS — I10 Essential (primary) hypertension: Secondary | ICD-10-CM | POA: Diagnosis not present

## 2017-05-02 DIAGNOSIS — N958 Other specified menopausal and perimenopausal disorders: Secondary | ICD-10-CM | POA: Diagnosis not present

## 2017-05-02 DIAGNOSIS — R5383 Other fatigue: Secondary | ICD-10-CM | POA: Diagnosis not present

## 2017-05-03 NOTE — Telephone Encounter (Signed)
Left voicemail for patient letting her know that the forms are up front and ready for pick up.

## 2017-05-07 DIAGNOSIS — E663 Overweight: Secondary | ICD-10-CM | POA: Diagnosis not present

## 2017-05-07 DIAGNOSIS — I10 Essential (primary) hypertension: Secondary | ICD-10-CM | POA: Diagnosis not present

## 2017-05-08 ENCOUNTER — Ambulatory Visit (INDEPENDENT_AMBULATORY_CARE_PROVIDER_SITE_OTHER): Payer: Medicare Other

## 2017-05-08 DIAGNOSIS — Z23 Encounter for immunization: Secondary | ICD-10-CM

## 2017-05-08 NOTE — Telephone Encounter (Signed)
Patient picked up form 05/03/17

## 2017-05-13 ENCOUNTER — Other Ambulatory Visit: Payer: Self-pay | Admitting: Family Medicine

## 2017-05-13 DIAGNOSIS — I10 Essential (primary) hypertension: Secondary | ICD-10-CM

## 2017-05-14 DIAGNOSIS — I10 Essential (primary) hypertension: Secondary | ICD-10-CM | POA: Diagnosis not present

## 2017-05-14 DIAGNOSIS — E663 Overweight: Secondary | ICD-10-CM | POA: Diagnosis not present

## 2017-05-16 ENCOUNTER — Encounter: Payer: Self-pay | Admitting: Family Medicine

## 2017-05-21 ENCOUNTER — Encounter (HOSPITAL_COMMUNITY): Payer: Self-pay | Admitting: Emergency Medicine

## 2017-05-21 DIAGNOSIS — Z79899 Other long term (current) drug therapy: Secondary | ICD-10-CM | POA: Diagnosis not present

## 2017-05-21 DIAGNOSIS — R1032 Left lower quadrant pain: Secondary | ICD-10-CM | POA: Diagnosis present

## 2017-05-21 DIAGNOSIS — E663 Overweight: Secondary | ICD-10-CM | POA: Diagnosis not present

## 2017-05-21 DIAGNOSIS — I10 Essential (primary) hypertension: Secondary | ICD-10-CM | POA: Diagnosis not present

## 2017-05-21 DIAGNOSIS — K5792 Diverticulitis of intestine, part unspecified, without perforation or abscess without bleeding: Secondary | ICD-10-CM | POA: Diagnosis not present

## 2017-05-21 DIAGNOSIS — Z91040 Latex allergy status: Secondary | ICD-10-CM | POA: Diagnosis not present

## 2017-05-21 DIAGNOSIS — K5732 Diverticulitis of large intestine without perforation or abscess without bleeding: Secondary | ICD-10-CM | POA: Diagnosis not present

## 2017-05-21 LAB — LIPASE, BLOOD: Lipase: 23 U/L (ref 11–51)

## 2017-05-21 LAB — COMPREHENSIVE METABOLIC PANEL
ALK PHOS: 95 U/L (ref 38–126)
ALT: 20 U/L (ref 14–54)
AST: 18 U/L (ref 15–41)
Albumin: 4.6 g/dL (ref 3.5–5.0)
Anion gap: 13 (ref 5–15)
BILIRUBIN TOTAL: 1.1 mg/dL (ref 0.3–1.2)
BUN: 14 mg/dL (ref 6–20)
CALCIUM: 9.4 mg/dL (ref 8.9–10.3)
CO2: 20 mmol/L — ABNORMAL LOW (ref 22–32)
CREATININE: 0.78 mg/dL (ref 0.44–1.00)
Chloride: 105 mmol/L (ref 101–111)
GFR calc Af Amer: >60 mL/min (ref >60)
Glucose, Bld: 85 mg/dL (ref 65–99)
POTASSIUM: 3.7 mmol/L (ref 3.5–5.1)
Sodium: 138 mmol/L (ref 135–145)
TOTAL PROTEIN: 7.8 g/dL (ref 6.5–8.1)

## 2017-05-21 LAB — CBC
HEMATOCRIT: 38.9 % (ref 36.0–46.0)
Hemoglobin: 13.6 g/dL (ref 12.0–15.0)
MCH: 30.5 pg (ref 26.0–34.0)
MCHC: 35 g/dL (ref 30.0–36.0)
MCV: 87.2 fL (ref 78.0–100.0)
Platelets: 159 10*3/uL (ref 150–400)
RBC: 4.46 MIL/uL (ref 3.87–5.11)
RDW: 13 % (ref 11.5–15.5)
WBC: 11.1 10*3/uL — AB (ref 4.0–10.5)

## 2017-05-21 MED ORDER — ONDANSETRON 4 MG PO TBDP
4.0000 mg | ORAL_TABLET | Freq: Once | ORAL | Status: AC | PRN
  Administered 2017-05-21: 4 mg via ORAL
  Filled 2017-05-21: qty 1

## 2017-05-21 NOTE — ED Triage Notes (Signed)
Patient having LLQ pain from diverticulitis, had n/v/d last night but none today. Patient c/o headache and entire body aches and fever earlier of 100.3 but reports didn't take any medications for fever.

## 2017-05-22 ENCOUNTER — Encounter (HOSPITAL_COMMUNITY): Payer: Self-pay

## 2017-05-22 ENCOUNTER — Emergency Department (HOSPITAL_COMMUNITY): Payer: Medicare Other

## 2017-05-22 ENCOUNTER — Emergency Department (HOSPITAL_COMMUNITY)
Admission: EM | Admit: 2017-05-22 | Discharge: 2017-05-22 | Disposition: A | Discharge status: Home / Self Care | Payer: Medicare Other | Attending: Emergency Medicine | Admitting: Emergency Medicine

## 2017-05-22 DIAGNOSIS — J300 Vasomotor rhinitis: Secondary | ICD-10-CM | POA: Diagnosis not present

## 2017-05-22 DIAGNOSIS — K5732 Diverticulitis of large intestine without perforation or abscess without bleeding: Secondary | ICD-10-CM | POA: Diagnosis not present

## 2017-05-22 DIAGNOSIS — J309 Allergic rhinitis, unspecified: Secondary | ICD-10-CM | POA: Diagnosis not present

## 2017-05-22 DIAGNOSIS — K5792 Diverticulitis of intestine, part unspecified, without perforation or abscess without bleeding: Secondary | ICD-10-CM | POA: Diagnosis not present

## 2017-05-22 DIAGNOSIS — H1045 Other chronic allergic conjunctivitis: Secondary | ICD-10-CM | POA: Diagnosis not present

## 2017-05-22 DIAGNOSIS — R05 Cough: Secondary | ICD-10-CM | POA: Diagnosis not present

## 2017-05-22 MED ORDER — IOPAMIDOL (ISOVUE-300) INJECTION 61%
100.0000 mL | Freq: Once | INTRAVENOUS | Status: AC | PRN
  Administered 2017-05-22: 100 mL via INTRAVENOUS

## 2017-05-22 MED ORDER — MORPHINE SULFATE (PF) 4 MG/ML IV SOLN
4.0000 mg | Freq: Once | INTRAVENOUS | Status: DC
  Filled 2017-05-22: qty 1

## 2017-05-22 MED ORDER — METRONIDAZOLE 500 MG PO TABS: ORAL_TABLET | ORAL | 0 refills | Status: DC

## 2017-05-22 MED ORDER — HYDROCODONE-ACETAMINOPHEN 5-325 MG PO TABS: 1.0000 | ORAL_TABLET | Freq: Four times a day (QID) | ORAL | 0 refills | Status: DC | PRN

## 2017-05-22 MED ORDER — ONDANSETRON HCL 4 MG/2ML IJ SOLN
4.0000 mg | Freq: Once | INTRAMUSCULAR | Status: AC
  Administered 2017-05-22: 4 mg via INTRAVENOUS
  Filled 2017-05-22: qty 2

## 2017-05-22 MED ORDER — CIPROFLOXACIN HCL 500 MG PO TABS: 500.0000 mg | ORAL_TABLET | Freq: Two times a day (BID) | ORAL | 0 refills | Status: DC

## 2017-05-22 MED ORDER — SODIUM CHLORIDE 0.9 % IV BOLUS (SEPSIS)
1000.0000 mL | Freq: Once | INTRAVENOUS | Status: AC
  Administered 2017-05-22: 1000 mL via INTRAVENOUS

## 2017-05-22 MED ORDER — IOPAMIDOL (ISOVUE-300) INJECTION 61%
INTRAVENOUS | Status: AC
  Administered 2017-05-22: 100 mL via INTRAVENOUS
  Filled 2017-05-22: qty 100

## 2017-05-22 NOTE — Discharge Instructions (Signed)
Cipro and Flagyl as prescribed.  Hydrocodone as prescribed as needed for pain.  Return to the emergency department if you develop worsening pain, high fevers, bloody stools, or other new and concerning symptoms.

## 2017-05-22 NOTE — ED Provider Notes (Addendum)
[facility] DEPT Provider Note   CSN: [id number] Arrival date & time: 05/21/17  1803     History   Chief Complaint Chief Complaint  Patient presents with  . Abdominal Pain  . Urinary Retention  . Headache    HPI Cheryl Barnes is a 66 y.o. female.  Patient is a 66 year old female with past medical history of diverticulitis. She presents today with complaints of left lower quadrant pain, difficulty urinating, loose stools that of been ongoing for the past several days. She states that this feels similar to her prior diverticulitis. She reports low-grade fevers at home. She denies any bloody stool. She denies any ill contacts.  She has a gastroenterologist in [place] who she follows with.   The history is provided by the patient.  Abdominal Pain   This is a recurrent problem. The current episode started 2 days ago. The problem occurs constantly. The problem has been gradually worsening. The pain is associated with an unknown factor. The pain is located in the LLQ. The quality of the pain is sharp. The pain is moderate. Pertinent negatives include hematochezia, melena, constipation and dysuria. Nothing aggravates the symptoms. Nothing relieves the symptoms.    Past Medical History:  Diagnosis Date  . Allergy   . Diverticulitis   . Hypertension   . LBBB (left bundle branch block)   . Migraine    history    Patient Active Problem List   Diagnosis Date Noted  . Splenic artery aneurysm ([place]) 03/01/2017  . Thyroid nodule 01/27/2017  . Acute diverticulitis 01/24/2017  . Recurrent herpes labialis 09/21/2016  . Pure hypercholesterolemia 09/05/2016  . BMI 30.0-30.9,adult 09/05/2016  . Bilateral hip pain 09/14/2015  . Routine general medical examination at a health care facility 09/14/2015  . LBBB (left bundle branch block) 12/23/2014  . Hematochezia 12/23/2014  . Abdominal pain   . Diverticulitis of large intestine without perforation or abscess without bleeding     . Chest pain   . Solitary pulmonary nodule 06/09/2014  . Left lower lobe pneumonia ([place]) 10/06/2013  . Urinary incontinence in female 03/02/2013  . Postmenopause atrophic vaginitis 03/02/2013  . RUQ PAIN 04/01/2007  . Essential hypertension 03/24/2007  . Diverticulosis of colon 03/17/2007    Past Surgical History:  Procedure Laterality Date  . CESAREAN SECTION    . CHOLECYSTECTOMY    . FRACTURE SURGERY    . PARTIAL HYSTERECTOMY     Abdominal  . ROTATOR CUFF REPAIR Left     OB History    No data available       Home Medications    Prior to Admission medications   Medication Sig Start Date End Date Taking? Authorizing Provider  amLODipine (NORVASC) 5 MG tablet TAKE 1 TABLET DAILY 05/13/17   [name], [name], MD  Calcium Citrate-Vitamin D (CALCIUM + D PO) Take 600 mg by mouth 2 (two) times daily.    [provider]  carvedilol (COREG) 3.125 MG tablet Take 1 tablet (3.125 mg total) by mouth 2 (two) times daily with a meal. 09/10/16   [name], [name], MD  Docusate Sodium (COLACE PO) Take 100 mg by mouth daily as needed (to prevent hard stools).     [provider]  docusate sodium (COLACE) 100 MG capsule Take 1 capsule (100 mg total) by mouth 2 (two) times daily. Patient taking differently: Take 100 mg by mouth daily.  05/08/16   Haney, Amedeo Plenty, MD  loratadine (CLARITIN) 10 MG tablet Take 10 mg by  mouth daily.     [provider]  valACYclovir (VALTREX) 1000 MG tablet TAKE 2 TABLETS BY MOUTH AS NEEDED UPON ACUTE ONSET AND ASAP AND REPEAT DOSE IN 12 HOURS 04/30/17   [name], [name], MD    Family History Family History  Problem Relation Age of Onset  . Hypertension Father   . Diabetes Father        adult onset  . Colon cancer Father   . Hypertension Mother   . Breast cancer Mother        breast  . Cancer Mother        breast  . Asthma Maternal Grandfather   . Hypertension Brother   . Diabetes Brother     Social History Social History   Substance Use Topics  . Smoking status: Never Smoker  . Smokeless tobacco: Never Used  . Alcohol use No     Allergies   Seasonal ic [cholestatin]; Tape; Latex; and Penicillins   Review of Systems Review of Systems  Gastrointestinal: Positive for abdominal pain. Negative for constipation, hematochezia and melena.  Genitourinary: Negative for dysuria.     Physical Exam Updated Vital Signs BP 126/80 (BP Location: Right Arm)   Pulse 66   Temp 98.6 F (37 C) (Oral)   Resp 18   Ht 5\' 1"  (1.549 m)   Wt 66.7 kg (147 lb)   SpO2 99%   BMI 27.78 kg/m   Physical Exam  Constitutional: She is oriented to person, place, and time. She appears well-developed and well-nourished. No distress.  HENT:  Head: Normocephalic and atraumatic.  Neck: Normal range of motion. Neck supple.  Cardiovascular: Normal rate and regular rhythm. Exam reveals no gallop and no friction rub.  No murmur heard. Pulmonary/Chest: Effort normal and breath sounds normal. No respiratory distress. She has no wheezes.  Abdominal: Soft. Bowel sounds are normal. She exhibits no distension, no fluid wave and no mass. There is tenderness in the left lower quadrant. There is no rigidity, no rebound and no guarding.  Musculoskeletal: Normal range of motion.  Neurological: She is alert and oriented to person, place, and time.  Skin: Skin is warm and dry. She is not diaphoretic.  Nursing note and vitals reviewed.    ED Treatments / Results  Labs (all labs ordered are listed, but only abnormal results are displayed) Labs Reviewed  COMPREHENSIVE METABOLIC PANEL - Abnormal; Notable for the following:       Result Value   CO2 20 (*)    All other components within normal limits  CBC - Abnormal; Notable for the following:    WBC 11.1 (*)    All other components within normal limits  LIPASE, BLOOD  URINALYSIS, ROUTINE W REFLEX MICROSCOPIC    EKG  EKG Interpretation None       Radiology No results  found.  Procedures Procedures (including critical care time)  Medications Ordered in ED Medications  sodium chloride 0.9 % bolus 1,000 mL (not administered)  morphine 4 MG/ML injection 4 mg (not administered)  ondansetron (ZOFRAN) injection 4 mg (not administered)  ondansetron (ZOFRAN-ODT) disintegrating tablet 4 mg (4 mg Oral Given 05/21/17 1832)     Initial Impression / Assessment and Plan / ED Course  I have reviewed the triage vital signs and the nursing notes.  Pertinent labs & imaging results that were available during my care of the patient were reviewed by me and considered in my medical decision making (see chart for details).  Patient with history  of diverticulitis presenting with left lower quadrant pain. She has a mild white count of 11,000 and CT scan confirms acute, uncomplicated diverticulitis. She will be treated with Cipro and Flagyl, pain medicine, and follow-up as needed.  Final Clinical Impressions(s) / ED Diagnoses   Final diagnoses:  None    New Prescriptions New Prescriptions   No medications on file     Veryl Speak, MD 05/22/17 1448    Veryl Speak, MD 07/04/17 [phone]

## 2017-05-25 ENCOUNTER — Other Ambulatory Visit: Payer: Self-pay | Admitting: Family Medicine

## 2017-05-25 DIAGNOSIS — I10 Essential (primary) hypertension: Secondary | ICD-10-CM

## 2017-05-29 DIAGNOSIS — Z6827 Body mass index (BMI) 27.0-27.9, adult: Secondary | ICD-10-CM | POA: Diagnosis not present

## 2017-05-29 DIAGNOSIS — I10 Essential (primary) hypertension: Secondary | ICD-10-CM | POA: Diagnosis not present

## 2017-06-06 DIAGNOSIS — Z6827 Body mass index (BMI) 27.0-27.9, adult: Secondary | ICD-10-CM | POA: Diagnosis not present

## 2017-06-06 DIAGNOSIS — I10 Essential (primary) hypertension: Secondary | ICD-10-CM | POA: Diagnosis not present

## 2017-06-13 DIAGNOSIS — Z6827 Body mass index (BMI) 27.0-27.9, adult: Secondary | ICD-10-CM | POA: Diagnosis not present

## 2017-06-13 DIAGNOSIS — I10 Essential (primary) hypertension: Secondary | ICD-10-CM | POA: Diagnosis not present

## 2017-06-20 DIAGNOSIS — I10 Essential (primary) hypertension: Secondary | ICD-10-CM | POA: Diagnosis not present

## 2017-06-20 DIAGNOSIS — Z6826 Body mass index (BMI) 26.0-26.9, adult: Secondary | ICD-10-CM | POA: Diagnosis not present

## 2017-06-27 DIAGNOSIS — Z6826 Body mass index (BMI) 26.0-26.9, adult: Secondary | ICD-10-CM | POA: Diagnosis not present

## 2017-06-27 DIAGNOSIS — I10 Essential (primary) hypertension: Secondary | ICD-10-CM | POA: Diagnosis not present

## 2017-06-28 DIAGNOSIS — M65331 Trigger finger, right middle finger: Secondary | ICD-10-CM | POA: Diagnosis not present

## 2017-07-04 DIAGNOSIS — I10 Essential (primary) hypertension: Secondary | ICD-10-CM | POA: Diagnosis not present

## 2017-07-04 DIAGNOSIS — Z6826 Body mass index (BMI) 26.0-26.9, adult: Secondary | ICD-10-CM | POA: Diagnosis not present

## 2017-07-08 DIAGNOSIS — M65331 Trigger finger, right middle finger: Secondary | ICD-10-CM | POA: Diagnosis not present

## 2017-07-11 DIAGNOSIS — Z6826 Body mass index (BMI) 26.0-26.9, adult: Secondary | ICD-10-CM | POA: Diagnosis not present

## 2017-07-11 DIAGNOSIS — I10 Essential (primary) hypertension: Secondary | ICD-10-CM | POA: Diagnosis not present

## 2017-07-14 ENCOUNTER — Other Ambulatory Visit: Payer: Self-pay | Admitting: Family Medicine

## 2017-07-14 DIAGNOSIS — B001 Herpesviral vesicular dermatitis: Secondary | ICD-10-CM

## 2017-07-17 DIAGNOSIS — I10 Essential (primary) hypertension: Secondary | ICD-10-CM | POA: Diagnosis not present

## 2017-07-17 DIAGNOSIS — Z6826 Body mass index (BMI) 26.0-26.9, adult: Secondary | ICD-10-CM | POA: Diagnosis not present

## 2017-07-25 DIAGNOSIS — Z6826 Body mass index (BMI) 26.0-26.9, adult: Secondary | ICD-10-CM | POA: Diagnosis not present

## 2017-07-25 DIAGNOSIS — I10 Essential (primary) hypertension: Secondary | ICD-10-CM | POA: Diagnosis not present

## 2017-07-25 DIAGNOSIS — E039 Hypothyroidism, unspecified: Secondary | ICD-10-CM | POA: Diagnosis not present

## 2017-07-30 DIAGNOSIS — Z6825 Body mass index (BMI) 25.0-25.9, adult: Secondary | ICD-10-CM | POA: Diagnosis not present

## 2017-07-30 DIAGNOSIS — I10 Essential (primary) hypertension: Secondary | ICD-10-CM | POA: Diagnosis not present

## 2017-08-01 DIAGNOSIS — M65331 Trigger finger, right middle finger: Secondary | ICD-10-CM | POA: Diagnosis not present

## 2017-08-13 DIAGNOSIS — I10 Essential (primary) hypertension: Secondary | ICD-10-CM | POA: Diagnosis not present

## 2017-08-13 DIAGNOSIS — Z6825 Body mass index (BMI) 25.0-25.9, adult: Secondary | ICD-10-CM | POA: Diagnosis not present

## 2017-08-28 DIAGNOSIS — I10 Essential (primary) hypertension: Secondary | ICD-10-CM | POA: Diagnosis not present

## 2017-08-28 DIAGNOSIS — Z6825 Body mass index (BMI) 25.0-25.9, adult: Secondary | ICD-10-CM | POA: Diagnosis not present

## 2017-09-23 NOTE — Progress Notes (Signed)
HPI:   Cheryl Barnes is a 67 y.o. female, who is here today for her routine preventive visit.  Last CPE: 09/05/2016.  Regular exercise 3 or more time per week: Yes, she is doing core exercises,walking,and wt lifting. Following a healthy diet: Yes  Since her last OV she has had right hand surgery,trigger middle finger.  Chronic medical problems: Diverticulosis, HTN,HLD allergies, thyroid nodule,and anxiety among some.  HTN: Current;y she is on Amlodipine 5 mg and Coreg 3.125 mg bid. Hx of LBBB. Denies headache, visual changes, chest pain, dyspnea, palpitation, claudication, focal weakness, or edema.  He lives alone. Independent ADL's and IADL's. No falls in the past year and denies depression symptoms.  HLD on non pharmacologic treatment.  Functional Status Survey: Is the patient deaf or have difficulty hearing?: No Does the patient have difficulty seeing, even when wearing glasses/contacts?: No Does the patient have difficulty concentrating, remembering, or making decisions?: No Does the patient have difficulty walking or climbing stairs?: No Does the patient have difficulty dressing or bathing?: No Does the patient have difficulty doing errands alone such as visiting a doctor's office or shopping?: No  Fall Risk  09/24/2017  Falls in the past year? No     Providers she sees regularly:  Eye care provider: [place]. Ortho: Prn Cardiologist:as needed. Gastroenterologist: prn.   Depression screen [facility] 2/9 09/24/2017  Decreased Interest 0  Down, Depressed, Hopeless 0  PHQ - 2 Score 0    Mini-Cog - 09/24/17 1018    Normal clock drawing test?  yes    How many words correct?  3         Visual Acuity Screening   Right eye Left eye Both eyes  Without correction: 20/30 20/25-1 20/20  With correction:     Hearing Screening Comments: Able to hear conversational tones w/o difficulty. No issues reported. Passes whisper test.   S/P  hysterectomy about 31 years ago due to heavy menses and dysmenorrhea. Hx of abnormal pap smears: Denies G:3 P:2 A:1  Hx of STD's: Denies  Immunization History  Administered Date(s) Administered  . Influenza Whole 05/27/2009  . Influenza, High Dose Seasonal PF 05/08/2017  . Influenza,inj,Quad PF,6+ Mos 05/13/2014  . Influenza-Unspecified 05/13/2013, 05/12/2014, 05/12/2015  . Pneumococcal Conjugate-13 12/18/2016  . Td 08/27/1997, 03/09/2008  . Zoster 10/06/2013    Mammogram: 10/2016 [facility] 1 Colonoscopy: 10/2013 DEXA: osteopenia in 2015 She is taking ca++ with Vit D.  Hep C screening: 09/05/2016 NR  Concerns today.  Right shoulder pain, sharp,intense. Exacerbated by movement and alleviated by rest. It has been going up for 4-6 weeks. No Hx of injuries.  She has not tried OTC analgesics. Hx of left rotator cuff surgery.  Also c/o occasional right ear discomfort, no earache but like fullness sensation. This has been going on for a while and it is stable. No hearing loss.  Review of Systems  Constitutional: Negative for appetite change, fatigue and fever.  HENT: Negative for dental problem, hearing loss, mouth sores, sore throat, trouble swallowing and voice change.   Eyes: Negative for photophobia and visual disturbance.  Respiratory: Negative for cough, shortness of breath and wheezing.   Cardiovascular: Negative for chest pain and leg swelling.  Gastrointestinal: Negative for abdominal pain, nausea and vomiting.       No changes in bowel habits.  Endocrine: Negative for cold intolerance, heat intolerance, polydipsia, polyphagia and polyuria.  Genitourinary: Negative for decreased urine volume, dysuria, hematuria, vaginal bleeding and  vaginal discharge.  Musculoskeletal: Positive for arthralgias. Negative for gait problem and neck pain.  Skin: Negative for color change and rash.  Neurological: Negative for syncope, weakness, numbness and headaches.    Psychiatric/Behavioral: Negative for confusion and sleep disturbance. The patient is not nervous/anxious.   All other systems reviewed and are negative.     Current Outpatient Medications on File Prior to Visit  Medication Sig Dispense Refill  . amLODipine (NORVASC) 5 MG tablet TAKE 1 TABLET DAILY 90 tablet 2  . Calcium Citrate-Vitamin D (CALCIUM + D PO) Take 600 mg by mouth 2 (two) times daily.    . carvedilol (COREG) 3.125 MG tablet TAKE 1 TABLET TWICE A DAY WITH MEALS 180 tablet 1  . docusate sodium (COLACE) 100 MG capsule Take 1 capsule (100 mg total) by mouth 2 (two) times daily. 30 capsule 0  . valACYclovir (VALTREX) 1000 MG tablet TAKE 2 TABLETS BY MOUTH AS NEEDED UPON ACUTE ONSET AND ASAP AND REPEAT DOSE IN 12 HOURS 16 tablet 2  . Calcium Carb-Cholecalciferol 600-800 MG-UNIT TABS Take by mouth.    . cetirizine (ZYRTEC) 10 MG tablet Take 10 mg by mouth daily.    Marland Kitchen HYDROcodone-acetaminophen (NORCO) 5-325 MG tablet Take 1-2 tablets by mouth every 6 (six) hours as needed. (Patient not taking: Reported on 09/24/2017) 15 tablet 0  . metroNIDAZOLE (FLAGYL) 500 MG tablet One po tid x 7 days (Patient not taking: Reported on 09/24/2017) 21 tablet 0   No current facility-administered medications on file prior to visit.      Past Medical History:  Diagnosis Date  . Allergy   . Diverticulitis   . Hypertension   . LBBB (left bundle branch block)   . Migraine    history    Past Surgical History:  Procedure Laterality Date  . CESAREAN SECTION    . CHOLECYSTECTOMY    . FRACTURE SURGERY    . PARTIAL HYSTERECTOMY     Abdominal  . ROTATOR CUFF REPAIR Left     Allergies  Allergen Reactions  . Seasonal Ic [Cholestatin] Other (See Comments)    Upper resp, sore throat, sneezing, etc.  . Tape Other (See Comments)    Tape causes some redness & causes BLISTERS if left on for too long  . Latex Itching and Rash  . Penicillins Itching and Rash    Has patient had a PCN reaction causing  immediate rash, facial/tongue/throat swelling, SOB or lightheadedness with hypotension: Yes Has patient had a PCN reaction causing severe rash involving mucus membranes or skin necrosis: No Has patient had a PCN reaction that required hospitalization No Has patient had a PCN reaction occurring within the last 10 years: No If all of the above answers are "NO", then may proceed with Cephalosporin use.     Family History  Problem Relation Age of Onset  . Hypertension Father   . Diabetes Father        adult onset  . Colon cancer Father   . Hypertension Mother   . Breast cancer Mother        breast  . Cancer Mother        breast  . Asthma Maternal Grandfather   . Hypertension Brother   . Diabetes Brother     Social History   Socioeconomic History  . Marital status: Single    Spouse name: None  . Number of children: None  . Years of education: None  . Highest education level: None  Social Needs  .  Financial resource strain: None  . Food insecurity - worry: None  . Food insecurity - inability: None  . Transportation needs - medical: None  . Transportation needs - non-medical: None  Occupational History  . Occupation: ophthalmology @ [employer]    Employer: [employer] Use  . Smoking status: Never Smoker  . Smokeless tobacco: Never Used  Substance and Sexual Activity  . Alcohol use: No  . Drug use: No  . Sexual activity: No  Other Topics Concern  . None  Social History Narrative  . None     Vitals:   09/24/17 0958  BP: 130/72  Pulse: 60  Temp: 98.5 F (36.9 C)  SpO2: 98%   Body mass index is 26.38 kg/m.   Wt Readings from Last 3 Encounters:  09/24/17 139 lb 9.6 oz (63.3 kg)  05/21/17 147 lb (66.7 kg)  03/01/17 154 lb 7 oz (70.1 kg)    Physical Exam  Nursing note and vitals reviewed. Constitutional: She is oriented to person, place, and time. She appears well-developed. No distress.  HENT:  Head: Normocephalic and atraumatic.  Right Ear:  Hearing, tympanic membrane, external ear and ear canal normal.  Left Ear: Hearing, tympanic membrane, external ear and ear canal normal.  Mouth/Throat: Uvula is midline, oropharynx is clear and moist and mucous membranes are normal.  Eyes: Conjunctivae and EOM are normal. Pupils are equal, round, and reactive to light.  Neck: No JVD present. No tracheal deviation present. Thyromegaly (mild) present.  Cardiovascular: Normal rate and regular rhythm.  No murmur heard. Pulses:      Dorsalis pedis pulses are 2+ on the right side, and 2+ on the left side.  Respiratory: Effort normal and breath sounds normal. No respiratory distress.  GI: Soft. She exhibits no mass. There is no hepatomegaly. There is no tenderness.  Genitourinary:  Genitourinary Comments: Breast: No masses, skin changes, or nipple discharge bilaterally.  Musculoskeletal: She exhibits no edema.  No major deformity or signs of synovitis appreciated. Right shoulder: No deformity, edema, or erythema appreciated. Luan Pulling' test pos, drop arm rotator cuff test pos, empty can supraspinatus test  pos, cross body adduction test neg, lift-Off Subscapularis test pos. ROM no significant limitation,it elicits pain.  Lymphadenopathy:    She has no cervical adenopathy.    She has no axillary adenopathy.       Right: No supraclavicular adenopathy present.       Left: No supraclavicular adenopathy present.  Neurological: She is alert and oriented to person, place, and time. She has normal strength. No cranial nerve deficit. Coordination and gait normal.  Reflex Scores:      Bicep reflexes are 2+ on the right side and 2+ on the left side.      Patellar reflexes are 2+ on the right side and 2+ on the left side. Skin: Skin is warm. No rash noted. No erythema.  Psychiatric: Her mood appears anxious. Cognition and memory are normal.  Well groomed, good eye contact.      ASSESSMENT AND PLAN:  Cheryl Barnes was seen today for annual  exam.  Diagnoses and all orders for this visit:  Orders Placed This Encounter  Procedures  . DEXAScan  . Basic metabolic panel  . Lipid panel    Medicare annual wellness visit, subsequent  We discussed the importance of staying active, physically and mentally, as well as the benefits of a healthy/balance diet. Low impact exercise that involve stretching and strengthing are ideal. Vaccines up to date.  We discussed preventive screening for the next 5-10 years, summery of recommendations given in AVS:  Colon cancer screening due in 2015 (coonoscopy). Eye exam and glaucoma screening annually. Diabetes screening annually. Fall prevention.  Advance directives and end of life discussed, she has POA and living will.   Essential hypertension  Adequately controlled. No changes in current management. DASH-low salt diet recommended. Eye exam recommended annually. She would like to follow annually. Monitor BP at home.  -     Basic metabolic panel  Pure hypercholesterolemia  Continue non pharmacologic treatment. We will follow labs done today and will give further recommendations accordingly.  -     Lipid panel  Asymptomatic postmenopausal estrogen deficiency -     DEXAScan; Future  Impingement syndrome of right shoulder  In regard to her shoulder pain, she prefers to try PT at home.  She remembers exercise as she did before, if not better she will arrange appointment with provider who is in the for left shoulder surgery.  Earache symptoms in right ear  ? Eustachian tube dysfunction. Autoinflation maneuvers as needed may help. Instructed about warning signs.      Return in 1 year (on 09/24/2018) for Medicare with Manuela Schwartz, f/u with me.      [name]. [name], MD  [facility]. [place] office.

## 2017-09-24 ENCOUNTER — Encounter: Payer: Self-pay | Admitting: Family Medicine

## 2017-09-24 ENCOUNTER — Ambulatory Visit (INDEPENDENT_AMBULATORY_CARE_PROVIDER_SITE_OTHER): Payer: Medicare Other | Admitting: Family Medicine

## 2017-09-24 VITALS — BP 130/72 | HR 60 | Temp 98.5°F | Resp 12 | Wt 139.6 lb

## 2017-09-24 DIAGNOSIS — E7800 Pure hypercholesterolemia, unspecified: Secondary | ICD-10-CM | POA: Diagnosis not present

## 2017-09-24 DIAGNOSIS — Z0000 Encounter for general adult medical examination without abnormal findings: Secondary | ICD-10-CM

## 2017-09-24 DIAGNOSIS — H9201 Otalgia, right ear: Secondary | ICD-10-CM | POA: Diagnosis not present

## 2017-09-24 DIAGNOSIS — I10 Essential (primary) hypertension: Secondary | ICD-10-CM | POA: Diagnosis not present

## 2017-09-24 DIAGNOSIS — Z780 Asymptomatic menopausal state: Secondary | ICD-10-CM

## 2017-09-24 DIAGNOSIS — M7541 Impingement syndrome of right shoulder: Secondary | ICD-10-CM

## 2017-09-24 DIAGNOSIS — Z0001 Encounter for general adult medical examination with abnormal findings: Secondary | ICD-10-CM | POA: Diagnosis not present

## 2017-09-24 LAB — BASIC METABOLIC PANEL
BUN: 16 mg/dL (ref 6–23)
CO2: 29 mEq/L (ref 19–32)
Calcium: 9.3 mg/dL (ref 8.4–10.5)
Chloride: 105 mEq/L (ref 96–112)
Creatinine, Ser: 0.6 mg/dL (ref 0.40–1.20)
GFR: 106.36 mL/min (ref >60.00)
Glucose, Bld: 101 mg/dL — ABNORMAL HIGH (ref 70–99)
Potassium: 3.6 mEq/L (ref 3.5–5.1)
SODIUM: 141 meq/L (ref 135–145)

## 2017-09-24 LAB — LIPID PANEL
CHOLESTEROL: 163 mg/dL (ref 0–200)
HDL: 53.5 mg/dL (ref >39.00)
LDL Cholesterol: 90 mg/dL (ref 0–99)
NonHDL: 109.89
Total CHOL/HDL Ratio: 3
Triglycerides: 99 mg/dL (ref 0.0–149.0)
VLDL: 19.8 mg/dL (ref 0.0–40.0)

## 2017-09-24 NOTE — Patient Instructions (Addendum)
A few things to remember from today's visit:   Medicare annual wellness visit, subsequent  Essential hypertension - Plan: Basic metabolic panel  Pure hypercholesterolemia - Plan: Lipid panel  Asymptomatic postmenopausal estrogen deficiency - Plan: DEXAScan  Impingement syndrome of right shoulder  Earache symptoms in right ear   A few tips:  -As we age balance is not as good as it was, so there is a higher risks for falls. Please remove small rugs and furniture that is "in your way" and could increase the risk of falls. Stretching exercises may help with fall prevention: Yoga and Tai Chi are some examples. Low impact exercise is better, so you are not very achy the next day.  -Sun screen and avoidance of direct sun light recommended. Caution with dehydration, if working outdoors be sure to drink enough fluids.  - Some medications are not safe as we age, increases the risk of side effects and can potentially interact with other medication you are also taken;  including some of over the counter medications. Be sure to let me know when you start a new medication even if it is a dietary/vitamin supplement.   -Healthy diet low in red meet/animal fat and sugar + regular physical activity is recommended.       Screening schedule for the next 5-10 years:  Colonoscopy in 2025  Glaucoma screening/eye exam every 1-2 years.  Mammogram for breast cancer screening annually.  Flu vaccine annually. 11/2017 Pneumovax. Diabetes screening   Fall prevention   Advance directives:  Please see a lawyer and/or go to this website to help you with advanced directives and designating a health care power of attorney so that your wishes will be followed should you become too ill to make your own medical decisions.  [email or web address]        Please be sure medication list is accurate. If a new problem present, please set up appointment sooner than planned  today.

## 2017-09-25 ENCOUNTER — Other Ambulatory Visit: Payer: Self-pay | Admitting: Family Medicine

## 2017-09-25 DIAGNOSIS — Z1231 Encounter for screening mammogram for malignant neoplasm of breast: Secondary | ICD-10-CM

## 2017-09-29 ENCOUNTER — Encounter: Payer: Self-pay | Admitting: Family Medicine

## 2017-09-30 IMAGING — DX DG HIP (WITH OR WITHOUT PELVIS) 3-4V BILAT
4 series · 4 of 4 positions shown · non-contrast
Comparison: None.

CLINICAL DATA: Chronic bilateral hip pain, more severe on the left
than on the right

EXAM:
DG HIP   3-4V BILAT

[hip ap (1 of 2)]
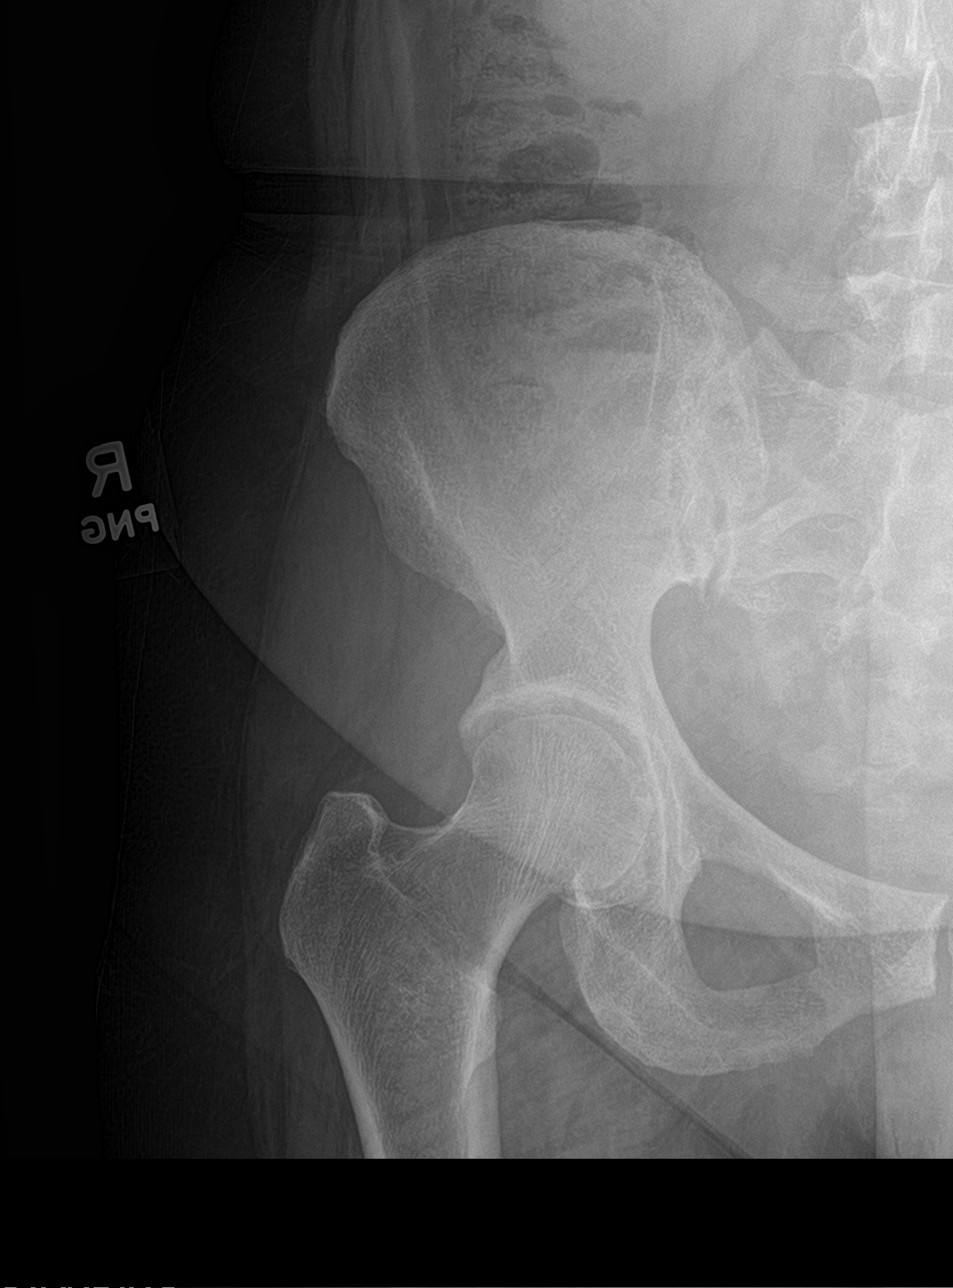

[hip lat (1 of 2)]
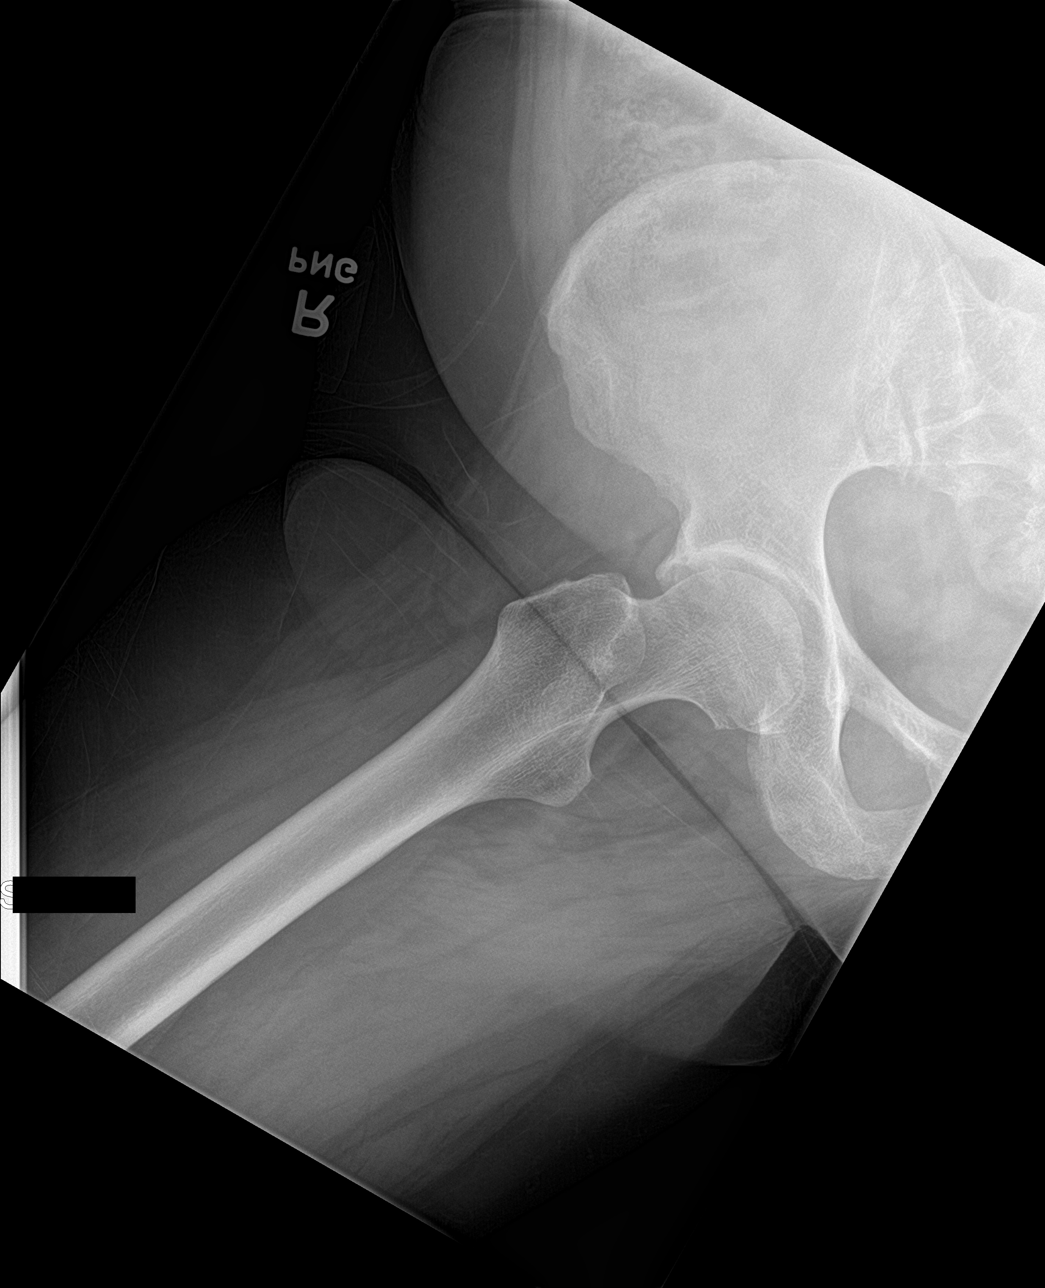

[hip ap (2 of 2)]
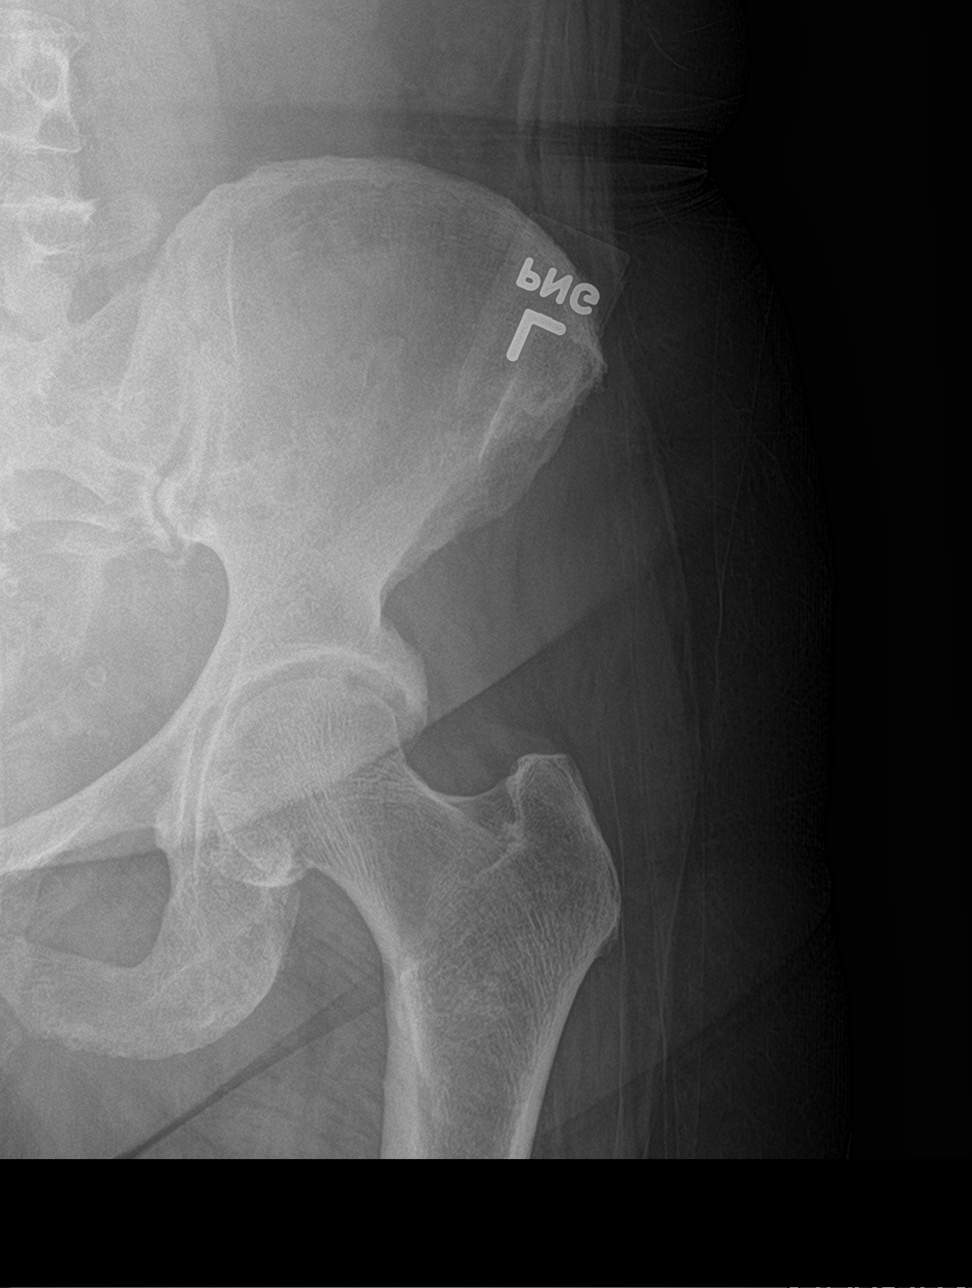

[hip lat (2 of 2)]
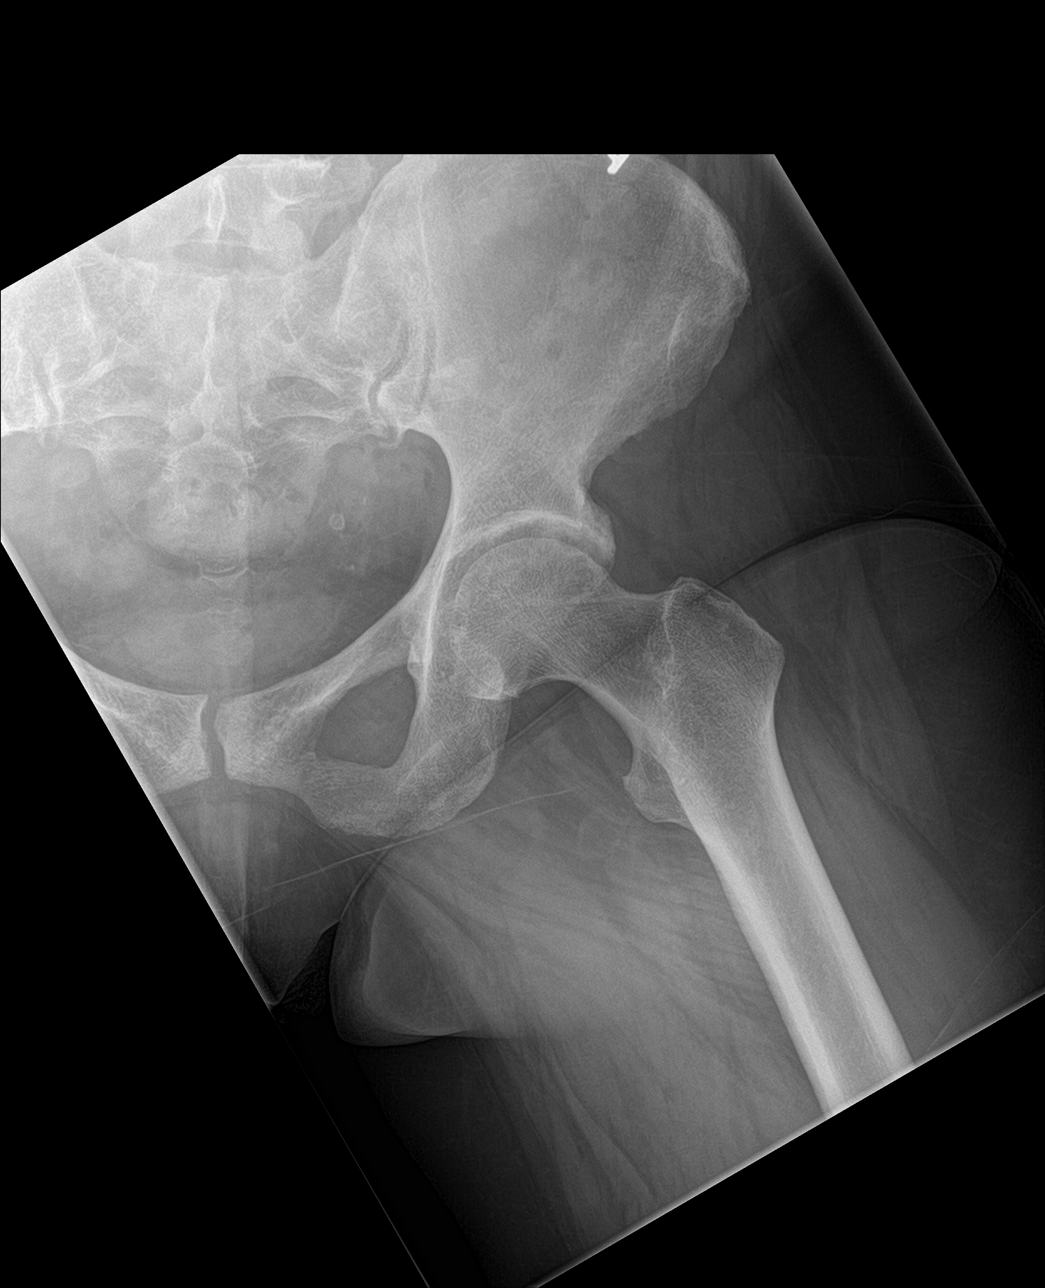

[4 of 4 positions shown; findings below may reference images not displayed]

FINDINGS: Frontal and lateral views of each hip, total four views, obtained.
There is no fracture or dislocation. There is mild symmetric
narrowing of both hip joints. No erosive change.
IMPRESSION: Slight symmetric osteoarthritic change bilaterally. No fracture or
dislocation.

## 2017-10-01 ENCOUNTER — Ambulatory Visit (INDEPENDENT_AMBULATORY_CARE_PROVIDER_SITE_OTHER)
Admission: RE | Admit: 2017-10-01 | Discharge: 2017-10-01 | Disposition: A | Discharge status: Home / Self Care | Payer: Medicare Other | Source: Ambulatory Visit | Attending: Family Medicine | Admitting: Family Medicine

## 2017-10-01 DIAGNOSIS — Z780 Asymptomatic menopausal state: Secondary | ICD-10-CM

## 2017-10-08 ENCOUNTER — Encounter: Payer: Self-pay | Admitting: Family Medicine

## 2017-10-30 DIAGNOSIS — M25511 Pain in right shoulder: Secondary | ICD-10-CM | POA: Diagnosis not present

## 2017-11-06 DIAGNOSIS — M25511 Pain in right shoulder: Secondary | ICD-10-CM | POA: Diagnosis not present

## 2017-11-06 DIAGNOSIS — M25519 Pain in unspecified shoulder: Secondary | ICD-10-CM | POA: Diagnosis not present

## 2017-11-14 DIAGNOSIS — M65331 Trigger finger, right middle finger: Secondary | ICD-10-CM | POA: Diagnosis not present

## 2017-11-14 DIAGNOSIS — Z4789 Encounter for other orthopedic aftercare: Secondary | ICD-10-CM | POA: Diagnosis not present

## 2017-11-20 DIAGNOSIS — M75121 Complete rotator cuff tear or rupture of right shoulder, not specified as traumatic: Secondary | ICD-10-CM | POA: Diagnosis not present

## 2017-11-21 ENCOUNTER — Ambulatory Visit
Admission: RE | Admit: 2017-11-21 | Discharge: 2017-11-21 | Disposition: A | Discharge status: Home / Self Care | Payer: Medicare Other | Source: Ambulatory Visit | Attending: Family Medicine | Admitting: Family Medicine

## 2017-11-21 ENCOUNTER — Other Ambulatory Visit: Payer: Self-pay | Admitting: Family Medicine

## 2017-11-21 DIAGNOSIS — I10 Essential (primary) hypertension: Secondary | ICD-10-CM

## 2017-11-21 DIAGNOSIS — Z1231 Encounter for screening mammogram for malignant neoplasm of breast: Secondary | ICD-10-CM | POA: Diagnosis not present

## 2017-11-22 ENCOUNTER — Other Ambulatory Visit: Payer: Self-pay | Admitting: Family Medicine

## 2017-11-22 ENCOUNTER — Telehealth: Payer: Self-pay | Admitting: Family Medicine

## 2017-11-22 DIAGNOSIS — R928 Other abnormal and inconclusive findings on diagnostic imaging of breast: Secondary | ICD-10-CM

## 2017-11-22 NOTE — Telephone Encounter (Signed)
Copied from [place]. Topic: Medical Record Request - Provider/Facility Request >> Nov 22, 2017  1:16 PM Cleaster Corin, [place] wrote: Patient Name/DOB/MRN #: Cheryl Barnes /[id number] Requestor Name/Agency: [facility]) Call Back #: [phone] Information Requested: medical records   Route to [place] for [place] clinics. For all other clinics, route to the clinic's PEC Pool.

## 2017-11-25 ENCOUNTER — Telehealth: Payer: Self-pay | Admitting: Family Medicine

## 2017-11-25 NOTE — Telephone Encounter (Signed)
Order was signed earlier today.  BJ

## 2017-11-25 NOTE — Telephone Encounter (Signed)
Pt has appointment at breast center in the morning, needs order signed from abnormal mammogram, either faxed form or in Epic signed.

## 2017-11-26 ENCOUNTER — Ambulatory Visit
Admission: RE | Admit: 2017-11-26 | Discharge: 2017-11-26 | Disposition: A | Discharge status: Home / Self Care | Payer: Medicare Other | Source: Ambulatory Visit | Attending: Family Medicine | Admitting: Family Medicine

## 2017-11-26 ENCOUNTER — Ambulatory Visit: Payer: Medicare Other

## 2017-11-26 DIAGNOSIS — R928 Other abnormal and inconclusive findings on diagnostic imaging of breast: Secondary | ICD-10-CM

## 2017-11-26 DIAGNOSIS — R922 Inconclusive mammogram: Secondary | ICD-10-CM | POA: Diagnosis not present

## 2017-11-26 NOTE — Telephone Encounter (Signed)
Orders faxed to the [facility]

## 2017-12-04 DIAGNOSIS — I10 Essential (primary) hypertension: Secondary | ICD-10-CM | POA: Diagnosis not present

## 2017-12-04 DIAGNOSIS — M75121 Complete rotator cuff tear or rupture of right shoulder, not specified as traumatic: Secondary | ICD-10-CM | POA: Diagnosis not present

## 2017-12-04 DIAGNOSIS — Z01810 Encounter for preprocedural cardiovascular examination: Secondary | ICD-10-CM | POA: Diagnosis not present

## 2017-12-04 DIAGNOSIS — I447 Left bundle-branch block, unspecified: Secondary | ICD-10-CM | POA: Diagnosis not present

## 2017-12-19 NOTE — Telephone Encounter (Signed)
Left voice message patient to call back.

## 2017-12-24 DIAGNOSIS — Z23 Encounter for immunization: Secondary | ICD-10-CM | POA: Diagnosis not present

## 2018-02-06 DIAGNOSIS — X58XXXA Exposure to other specified factors, initial encounter: Secondary | ICD-10-CM | POA: Diagnosis not present

## 2018-02-06 DIAGNOSIS — S46191A Other injury of muscle, fascia and tendon of long head of biceps, right arm, initial encounter: Secondary | ICD-10-CM | POA: Diagnosis not present

## 2018-02-06 DIAGNOSIS — M19011 Primary osteoarthritis, right shoulder: Secondary | ICD-10-CM | POA: Diagnosis not present

## 2018-02-06 DIAGNOSIS — Y999 Unspecified external cause status: Secondary | ICD-10-CM | POA: Diagnosis not present

## 2018-02-06 DIAGNOSIS — S46011A Strain of muscle(s) and tendon(s) of the rotator cuff of right shoulder, initial encounter: Secondary | ICD-10-CM | POA: Diagnosis not present

## 2018-02-06 DIAGNOSIS — S46091A Other injury of muscle(s) and tendon(s) of the rotator cuff of right shoulder, initial encounter: Secondary | ICD-10-CM | POA: Diagnosis not present

## 2018-02-06 DIAGNOSIS — G8918 Other acute postprocedural pain: Secondary | ICD-10-CM | POA: Diagnosis not present

## 2018-02-06 DIAGNOSIS — S46291A Other injury of muscle, fascia and tendon of other parts of biceps, right arm, initial encounter: Secondary | ICD-10-CM | POA: Diagnosis not present

## 2018-02-06 DIAGNOSIS — M7541 Impingement syndrome of right shoulder: Secondary | ICD-10-CM | POA: Diagnosis not present

## 2018-02-12 DIAGNOSIS — M25511 Pain in right shoulder: Secondary | ICD-10-CM | POA: Diagnosis not present

## 2018-02-12 MED FILL — METHOCARBAMOL 500 MG TABLET: 500 | 16 days supply | Qty: 50 | Fill #0

## 2018-02-12 MED FILL — oxyCODONE HCL 5 MG TABS: 5 | 7 days supply | Qty: 40 | Fill #0

## 2018-02-19 DIAGNOSIS — M25511 Pain in right shoulder: Secondary | ICD-10-CM | POA: Diagnosis not present

## 2018-02-25 DIAGNOSIS — M25511 Pain in right shoulder: Secondary | ICD-10-CM | POA: Diagnosis not present

## 2018-03-04 DIAGNOSIS — M25511 Pain in right shoulder: Secondary | ICD-10-CM | POA: Diagnosis not present

## 2018-03-11 DIAGNOSIS — M25511 Pain in right shoulder: Secondary | ICD-10-CM | POA: Diagnosis not present

## 2018-03-18 ENCOUNTER — Other Ambulatory Visit: Payer: Self-pay | Admitting: Physician Assistant

## 2018-03-18 DIAGNOSIS — D229 Melanocytic nevi, unspecified: Secondary | ICD-10-CM | POA: Diagnosis not present

## 2018-03-18 DIAGNOSIS — L821 Other seborrheic keratosis: Secondary | ICD-10-CM | POA: Diagnosis not present

## 2018-03-18 DIAGNOSIS — L814 Other melanin hyperpigmentation: Secondary | ICD-10-CM | POA: Diagnosis not present

## 2018-03-18 DIAGNOSIS — D485 Neoplasm of uncertain behavior of skin: Secondary | ICD-10-CM | POA: Diagnosis not present

## 2018-03-18 DIAGNOSIS — M25511 Pain in right shoulder: Secondary | ICD-10-CM | POA: Diagnosis not present

## 2018-03-21 DIAGNOSIS — M25511 Pain in right shoulder: Secondary | ICD-10-CM | POA: Diagnosis not present

## 2018-03-25 DIAGNOSIS — M25511 Pain in right shoulder: Secondary | ICD-10-CM | POA: Diagnosis not present

## 2018-03-27 DIAGNOSIS — M25511 Pain in right shoulder: Secondary | ICD-10-CM | POA: Diagnosis not present

## 2018-04-03 DIAGNOSIS — M25511 Pain in right shoulder: Secondary | ICD-10-CM | POA: Diagnosis not present

## 2018-04-07 DIAGNOSIS — M25511 Pain in right shoulder: Secondary | ICD-10-CM | POA: Diagnosis not present

## 2018-04-11 DIAGNOSIS — M25511 Pain in right shoulder: Secondary | ICD-10-CM | POA: Diagnosis not present

## 2018-04-14 DIAGNOSIS — M25511 Pain in right shoulder: Secondary | ICD-10-CM | POA: Diagnosis not present

## 2018-04-17 DIAGNOSIS — M25511 Pain in right shoulder: Secondary | ICD-10-CM | POA: Diagnosis not present

## 2018-04-30 DIAGNOSIS — M25511 Pain in right shoulder: Secondary | ICD-10-CM | POA: Diagnosis not present

## 2018-05-07 DIAGNOSIS — M25511 Pain in right shoulder: Secondary | ICD-10-CM | POA: Diagnosis not present

## 2018-05-22 DIAGNOSIS — M25511 Pain in right shoulder: Secondary | ICD-10-CM | POA: Diagnosis not present

## 2018-05-24 DIAGNOSIS — Z23 Encounter for immunization: Secondary | ICD-10-CM | POA: Diagnosis not present

## 2018-05-27 DIAGNOSIS — M25511 Pain in right shoulder: Secondary | ICD-10-CM | POA: Diagnosis not present

## 2018-05-29 DIAGNOSIS — M25511 Pain in right shoulder: Secondary | ICD-10-CM | POA: Diagnosis not present

## 2018-06-03 DIAGNOSIS — M25511 Pain in right shoulder: Secondary | ICD-10-CM | POA: Diagnosis not present

## 2018-06-05 DIAGNOSIS — M25511 Pain in right shoulder: Secondary | ICD-10-CM | POA: Diagnosis not present

## 2018-06-24 DIAGNOSIS — M25511 Pain in right shoulder: Secondary | ICD-10-CM | POA: Diagnosis not present

## 2018-07-01 DIAGNOSIS — M25511 Pain in right shoulder: Secondary | ICD-10-CM | POA: Diagnosis not present

## 2018-07-03 DIAGNOSIS — M25511 Pain in right shoulder: Secondary | ICD-10-CM | POA: Diagnosis not present

## 2018-09-25 ENCOUNTER — Other Ambulatory Visit: Payer: Self-pay | Admitting: Family Medicine

## 2018-09-25 DIAGNOSIS — B001 Herpesviral vesicular dermatitis: Secondary | ICD-10-CM

## 2018-09-30 ENCOUNTER — Other Ambulatory Visit: Payer: Self-pay | Admitting: Family Medicine

## 2018-09-30 DIAGNOSIS — H1045 Other chronic allergic conjunctivitis: Secondary | ICD-10-CM | POA: Diagnosis not present

## 2018-09-30 DIAGNOSIS — H0100B Unspecified blepharitis left eye, upper and lower eyelids: Secondary | ICD-10-CM | POA: Diagnosis not present

## 2018-09-30 DIAGNOSIS — B001 Herpesviral vesicular dermatitis: Secondary | ICD-10-CM

## 2018-09-30 DIAGNOSIS — H0100A Unspecified blepharitis right eye, upper and lower eyelids: Secondary | ICD-10-CM | POA: Diagnosis not present

## 2018-10-01 ENCOUNTER — Encounter: Payer: Self-pay | Admitting: Family Medicine

## 2018-10-01 ENCOUNTER — Ambulatory Visit (INDEPENDENT_AMBULATORY_CARE_PROVIDER_SITE_OTHER): Payer: Medicare Other | Admitting: Family Medicine

## 2018-10-01 ENCOUNTER — Other Ambulatory Visit: Payer: Self-pay | Admitting: *Deleted

## 2018-10-01 VITALS — BP 122/76 | HR 63 | Temp 98.1°F | Resp 12 | Ht 61.0 in | Wt 156.0 lb

## 2018-10-01 DIAGNOSIS — E041 Nontoxic single thyroid nodule: Secondary | ICD-10-CM | POA: Diagnosis not present

## 2018-10-01 DIAGNOSIS — Z0000 Encounter for general adult medical examination without abnormal findings: Secondary | ICD-10-CM | POA: Diagnosis not present

## 2018-10-01 DIAGNOSIS — E7800 Pure hypercholesterolemia, unspecified: Secondary | ICD-10-CM | POA: Diagnosis not present

## 2018-10-01 DIAGNOSIS — G4762 Sleep related leg cramps: Secondary | ICD-10-CM

## 2018-10-01 DIAGNOSIS — I10 Essential (primary) hypertension: Secondary | ICD-10-CM | POA: Diagnosis not present

## 2018-10-01 DIAGNOSIS — R600 Localized edema: Secondary | ICD-10-CM | POA: Diagnosis not present

## 2018-10-01 LAB — BASIC METABOLIC PANEL
BUN: 13 mg/dL (ref 6–23)
CALCIUM: 9.2 mg/dL (ref 8.4–10.5)
CO2: 26 mEq/L (ref 19–32)
Chloride: 108 mEq/L (ref 96–112)
Creatinine, Ser: 0.68 mg/dL (ref 0.40–1.20)
GFR: 86.34 mL/min (ref >60.00)
Glucose, Bld: 91 mg/dL (ref 70–99)
Potassium: 4.1 mEq/L (ref 3.5–5.1)
Sodium: 143 mEq/L (ref 135–145)

## 2018-10-01 LAB — LIPID PANEL
CHOL/HDL RATIO: 3
Cholesterol: 179 mg/dL (ref 0–200)
HDL: 54.6 mg/dL (ref >39.00)
LDL Cholesterol: 99 mg/dL (ref 0–99)
NonHDL: 124.61
Triglycerides: 129 mg/dL (ref 0.0–149.0)
VLDL: 25.8 mg/dL (ref 0.0–40.0)

## 2018-10-01 LAB — TSH: TSH: 0.87 u[IU]/mL (ref 0.35–4.50)

## 2018-10-01 MED ORDER — VALACYCLOVIR HCL 1 G PO TABS: ORAL_TABLET | ORAL | 5 refills | Status: DC

## 2018-10-01 NOTE — Addendum Note (Signed)
Addended by: Zacarias Pontes on: 10/01/2018 03:25 PM   Modules accepted: Orders

## 2018-10-01 NOTE — Patient Instructions (Addendum)
A few things to remember from today's visit:   Medicare annual wellness visit, subsequent  Essential hypertension - Plan: Basic metabolic panel  Pure hypercholesterolemia - Plan: Lipid panel  Thyroid nodule - Plan: TSH  Bilateral lower extremity edema  Nocturnal leg cramps  A few tips:  -As we age balance is not as good as it was, so there is a higher risks for falls. Please remove small rugs and furniture that is "in your way" and could increase the risk of falls. Stretching exercises may help with fall prevention: Yoga and Tai Chi are some examples. Low impact exercise is better, so you are not very achy the next day.  -Sun screen and avoidance of direct sun light recommended. Caution with dehydration, if working outdoors be sure to drink enough fluids.  - Some medications are not safe as we age, increases the risk of side effects and can potentially interact with other medication you are also taken;  including some of over the counter medications. Be sure to let me know when you start a new medication even if it is a dietary/vitamin supplement.   -Healthy diet low in red meet/animal fat and sugar + regular physical activity is recommended.       Screening schedule for the next 5-10 years:  Colonoscopy in 10/2018  Glaucoma screening/eye exam every 1-2 years.  Mammogram for breast cancer screening annually.Next one due in 12/2018  Flu vaccine annually.  Diabetes and cholesterol screening   Fall prevention   Vein disease is a condition that can affect the veins in the legs. It can cause leg pain, varicose veins, swollen legs, or open sores. Varicose veins are swollen and twisted veins. Things that may help: leg exercises (ankle flexion, walking),compression stocking, OTC horse chestnut seed extract 300 mg twice daily.  Compression stockings- [facility] in [place]    Please be sure medication list is accurate. If a new problem present, please set up appointment  sooner than planned today.

## 2018-10-01 NOTE — Progress Notes (Addendum)
HPI:   Cheryl Barnes is a 68 y.o. female, who is here today for her Medicare preventive visit.  Last AWV: 09/24/17  Since her last OV she has seen her cardiologies,meds were adjusted. She also underwent right rotator cuff surgery. Also her mother died a few months ago. She is having mild depressed mood, thinking about doing counseling through hospice.   Regular exercise 3 or more time per week: Walking and wt lifting 3-4 times per week. Following a healthy diet: Yes. She lives alone.   Independent ADL's and IADL's. No falls in the past year and denies depression symptoms.  Functional Status Survey: Is the patient deaf or have difficulty hearing?: No Does the patient have difficulty seeing, even when wearing glasses/contacts?: No Does the patient have difficulty concentrating, remembering, or making decisions?: No Does the patient have difficulty walking or climbing stairs?: No Does the patient have difficulty dressing or bathing?: No Does the patient have difficulty doing errands alone such as visiting a doctor's office or shopping?: No  Fall Risk  10/04/2018 09/24/2017  Falls in the past year? 0 No  Number falls in past yr: 0 -  Injury with Fall? 0 -  Follow up Education provided -     Providers she sees regularly:  Eye care provider: Dr Wynetta Emery. Ortho,Dr Theda Sers. Cardiologist prn.  Depression screen PHQ 2/9 10/01/2018  Decreased Interest 0  Down, Depressed, Hopeless 1  PHQ - 2 Score 1      Visual Acuity Screening   Right eye Left eye Both eyes  Without correction: 20/20 20/20 20/25   With correction:       Chronic medical problems: HTN,HLD,thyroid nodule,and diverticulosis among some.  HTN: Currently she is taking Amlodipine 10 mg daily,increased recently. Carvedilol was discontinued. Denies severe/frequent headache, visual changes, chest pain, dyspnea, palpitation, claudication, or focal weakness.  HLD: She is on non pharmacologic  treatment.  Thyroid nodules: Thyroid US 01/2017: The gland is markedly heterogeneous. Small nodules an a simple cyst do not meet criteria for follow-up or biopsy.   Immunization History  Administered Date(s) Administered  . Influenza Whole 05/27/2009  . Influenza, High Dose Seasonal PF 05/08/2017  . Influenza,inj,Quad PF,6+ Mos 05/13/2014  . Influenza-Unspecified 05/13/2013, 05/12/2014, 05/12/2015  . Pneumococcal Conjugate-13 12/18/2016  . Td 08/27/1997, 03/09/2008  . Zoster 10/06/2013    Mammogram: 11/26/17 Bi-Rads 1 Colonoscopy: 10/29/2013. DEXA: 09/2017 osteopenia.  Hep C screening: 08/2016 NR  Concerns today:  LE cramps and feet,intermittent and usually at night when in bed. 3-4 months,not daily.  No erythema. + LE edema at the end of the day, alleviated by elevation,not present in the morning when she first gets up. "Muscle tightness",R>L. She has similar problem before but sporadic.  She has not identified exacerbating or alleviating factors.  Tried OTC "Teraworks",which helps.  Review of Systems  Constitutional: Negative for activity change, appetite change, fatigue and fever.  HENT: Negative for mouth sores, nosebleeds and trouble swallowing.   Eyes: Negative for redness and visual disturbance.  Respiratory: Negative for cough, shortness of breath and wheezing.   Cardiovascular: Negative for chest pain, palpitations and leg swelling.  Gastrointestinal: Negative for abdominal pain, nausea and vomiting.       Negative for changes in bowel habits.  Endocrine: Negative for cold intolerance, heat intolerance, polydipsia, polyphagia and polyuria.  Genitourinary: Negative for decreased urine volume, dysuria and hematuria.  Musculoskeletal: Positive for arthralgias and myalgias. Negative for gait problem.  Skin: Negative for color change and rash.  Neurological: Negative for syncope, weakness and headaches.  Psychiatric/Behavioral: The patient is nervous/anxious.        Current Outpatient Medications on File Prior to Visit  Medication Sig Dispense Refill  . amLODipine (NORVASC) 10 MG tablet     . Calcium Carb-Cholecalciferol 600-800 MG-UNIT TABS Take by mouth.    . Calcium Citrate-Vitamin D (CALCIUM + D PO) Take 600 mg by mouth 2 (two) times daily.    . cetirizine (ZYRTEC) 10 MG tablet Take 10 mg by mouth daily.    Marland Kitchen docusate sodium (COLACE) 100 MG capsule Take 1 capsule (100 mg total) by mouth 2 (two) times daily. 30 capsule 0  . nitroGLYCERIN (NITROSTAT) 0.4 MG SL tablet Nitrostat 0.4 mg sublingual tablet    . pneumococcal 23 valent vaccine (PNEUMOVAX 23) 25 MCG/0.5ML injection Pneumovax 23 25 mcg/0.5 mL injection syringe  ADM 0.5ML IM UTD     No current facility-administered medications on file prior to visit.      Past Medical History:  Diagnosis Date  . Allergy   . Diverticulitis   . Hypertension   . LBBB (left bundle branch block)   . Migraine    history    Past Surgical History:  Procedure Laterality Date  . CESAREAN SECTION    . CHOLECYSTECTOMY    . FRACTURE SURGERY    . PARTIAL HYSTERECTOMY     Abdominal  . ROTATOR CUFF REPAIR Left     Allergies  Allergen Reactions  . Penicillins Itching and Rash    Has patient had a PCN reaction causing immediate rash, facial/tongue/throat swelling, SOB or lightheadedness with hypotension: Yes Has patient had a PCN reaction causing severe rash involving mucus membranes or skin necrosis: No Has patient had a PCN reaction that required hospitalization No Has patient had a PCN reaction occurring within the last 10 years: No If all of the above answers are "NO", then may proceed with Cephalosporin use.   . Seasonal Ic [Cholestatin] Other (See Comments)    Upper resp, sore throat, sneezing, etc.  . Tape Other (See Comments)    Tape causes some redness & causes BLISTERS if left on for too long  . Latex Itching and Rash    Family History  Problem Relation Age of Onset  . Hypertension  Father   . Diabetes Father        adult onset  . Colon cancer Father   . Hypertension Mother   . Breast cancer Mother        breast  . Cancer Mother        breast  . Asthma Maternal Grandfather   . Hypertension Brother   . Diabetes Brother     Social History   Socioeconomic History  . Marital status: Single    Spouse name: Not on file  . Number of children: Not on file  . Years of education: Not on file  . Highest education level: Not on file  Occupational History  . Occupation: ophthalmology @ [employer]    Employer: [employer]  . Financial resource strain: Not on file  . Food insecurity:    Worry: Not on file    Inability: Not on file  . Transportation needs:    Medical: Not on file    Non-medical: Not on file  Tobacco Use  . Smoking status: Never Smoker  . Smokeless tobacco: Never Used  Substance and Sexual Activity  . Alcohol use: No  . Drug use: No  . Sexual  activity: Never  Lifestyle  . Physical activity:    Days per week: Not on file    Minutes per session: Not on file  . Stress: Not on file  Relationships  . Social connections:    Talks on phone: Not on file    Gets together: Not on file    Attends religious service: Not on file    Active member of club or organization: Not on file    Attends meetings of clubs or organizations: Not on file    Relationship status: Not on file  Other Topics Concern  . Not on file  Social History Narrative  . Not on file     Vitals:   10/01/18 0938  BP: 122/76  Pulse: 63  Resp: 12  Temp: 98.1 F (36.7 C)  SpO2: 98%   Body mass index is 29.48 kg/m.   Wt Readings from Last 3 Encounters:  10/01/18 156 lb (70.8 kg)  09/24/17 139 lb 9.6 oz (63.3 kg)  05/21/17 147 lb (66.7 kg)    Physical Exam  Nursing note and vitals reviewed. Constitutional: She is oriented to person, place, and time. She appears well-developed. No distress.  HENT:  Head: Normocephalic and atraumatic.  Mouth/Throat:  Oropharynx is clear and moist and mucous membranes are normal.  Eyes: Pupils are equal, round, and reactive to light. Conjunctivae are normal.  Neck: No tracheal deviation present. Thyromegaly present.  Cardiovascular: Normal rate and regular rhythm.  No murmur heard. Pulses:      Dorsalis pedis pulses are 2+ on the right side and 2+ on the left side.  Varicose veins LE,bilateral.  Respiratory: Effort normal and breath sounds normal. No respiratory distress.  GI: Soft. She exhibits no mass. There is no hepatomegaly. There is no abdominal tenderness.  Musculoskeletal:        General: Edema (1+ pitting LE edema,bilateral.) present.  Lymphadenopathy:    She has no cervical adenopathy.  Neurological: She is alert and oriented to person, place, and time. She has normal strength. No cranial nerve deficit. Gait normal.  Skin: Skin is warm. No rash noted. No erythema.  Psychiatric: Her mood appears anxious.  Well groomed, good eye contact.    ASSESSMENT AND PLAN:  Cheryl Barnes was here today annual physical examination.   Orders Placed This Encounter  Procedures  . Basic metabolic panel  . Lipid panel  . TSH    Lab Results  Component Value Date   CREATININE 0.68 10/01/2018   BUN 13 10/01/2018   NA 143 10/01/2018   K 4.1 10/01/2018   CL 108 10/01/2018   CO2 26 10/01/2018   Lab Results  Component Value Date   TSH 0.87 10/01/2018   Lab Results  Component Value Date   CHOL 179 10/01/2018   HDL 54.60 10/01/2018   LDLCALC 99 10/01/2018   LDLDIRECT 120.6 08/22/2011   TRIG 129.0 10/01/2018   CHOLHDL 3 10/01/2018      Medicare annual wellness visit, subsequent We discussed the importance of staying active, physically and mentally, as well as the benefits of a healthy/balnace diet. Low impact exercise that involve stretching and strengthing are ideal. Vaccines: Prefers to hold on pneumovax. We discussed preventive screening for the next 5-10 years, summery of  recommendations given in AVS:  Colonoscopy in 10/2018  Glaucoma screening/eye exam every 1-2 years.  Mammogram for breast cancer screening annually.Next one due in 12/2018  Flu vaccine annually.  Diabetes and cholesterol screening  Fall prevention  She  has POA and living will.   The 10-year ASCVD risk score Mikey Bussing DC Brooke Bonito., et al., 2013) is: 7.2%   Values used to calculate the score:     Age: 62 years     Sex: Female     Is Non-Hispanic African American: No     Diabetic: No     Tobacco smoker: No     Systolic Blood Pressure: 211 mmHg     Is BP treated: Yes     HDL Cholesterol: 54.6 mg/dL     Total Cholesterol: 179 mg/dL  Essential hypertension Adequately controlled. No changes in current management. Low salt diet. Eye exam recommended annually. F/U in 12 months, before if needed.  -     Basic metabolic panel  Pure hypercholesterolemia Continue non pharmacologic treatment. Further recommendations will be given according to FLP results and 10 years CVD risk.  -     Lipid panel  Thyroid nodule Stable. Further recommendations will be given according to lab results.  -     TSH  Bilateral lower extremity edema Possible causes discussed. ? Amlodipine side effect. Vein disease. LE elevation. Instructed about warning signs.  Nocturnal leg cramps Stretching exercises,soaks at night, continue topical cream. Further recommendations according to lab results.    Return in about 1 year (around 10/02/2019) for Medicare and follow up.     Harrison Zetina G. [name], MD  [facility]. [place] office.

## 2018-10-03 ENCOUNTER — Encounter: Payer: Self-pay | Admitting: Family Medicine

## 2018-10-04 MED ORDER — AMLODIPINE BESYLATE 10 MG PO TABS: 10.0000 mg | ORAL_TABLET | Freq: Every day | ORAL | 2 refills | Status: DC

## 2018-10-04 NOTE — Addendum Note (Signed)
Addended by: [name], Bayli Quesinberry G on: 10/04/2018 11:55 PM   Modules accepted: Orders

## 2018-10-07 ENCOUNTER — Telehealth: Payer: Self-pay | Admitting: Gastroenterology

## 2018-10-07 NOTE — Telephone Encounter (Signed)
She had a colonoscopy in 2005 by Dr. Maurene Capes and no polyps were found.  She was recommended to have a repeat colonoscopy at 10-year interval.   She had a colonoscopy in 2015 in [place].  I reviewed that report.  There was no mention of why the test was being done.  No polyps were found.  She was recommended to have a repeat colonoscopy in 5 years but I do not know why that was recommended to her.   I cannot tell why she would be recommended to have a repeat colonoscopy now.  I am happy to see her in the office to discuss this further.  Perhaps she has a new family history of colon cancer?

## 2018-10-07 NOTE — Telephone Encounter (Signed)
Hi Dr. Ardis Hughs,  Patient called and advised that it was time for her next colonoscopy. Her Lat procedure was done in 2015. I have sent over her records to your desk and they are also found in Epic-Media Tab. She requested to be seen by you and would like to come back to our office. Was seen my Dr. Olevia Perches in 2005. Would you like for me to schedule for an OV?

## 2018-10-13 ENCOUNTER — Other Ambulatory Visit: Payer: Self-pay | Admitting: Family Medicine

## 2018-10-13 DIAGNOSIS — Z1231 Encounter for screening mammogram for malignant neoplasm of breast: Secondary | ICD-10-CM

## 2018-11-11 ENCOUNTER — Encounter: Payer: Self-pay | Admitting: Gastroenterology

## 2018-11-11 ENCOUNTER — Other Ambulatory Visit: Payer: Self-pay

## 2018-11-11 ENCOUNTER — Ambulatory Visit (INDEPENDENT_AMBULATORY_CARE_PROVIDER_SITE_OTHER): Payer: Medicare Other | Admitting: Gastroenterology

## 2018-11-11 VITALS — BP 114/60 | HR 77 | Temp 97.8°F | Ht 61.0 in | Wt 161.0 lb

## 2018-11-11 DIAGNOSIS — Z800 Family history of malignant neoplasm of digestive organs: Secondary | ICD-10-CM | POA: Diagnosis not present

## 2018-11-11 NOTE — Patient Instructions (Addendum)
Recall colonoscopy for routine risk colon cancer screening in 10/2023  Thank you for entrusting me with your care and choosing [facility].  Dr Ardis Hughs

## 2018-11-11 NOTE — Progress Notes (Signed)
HPI: This is a very pleasant 68 year old woman who was referred to me by [name], [name], MD  to evaluate family history of colon cancer.    Chief complaint is family history of colon cancer  Father diagnosed with CRC at the age of 87.  She has had no issues with her bowels.  Specifically no change in bowels, no significant constipation or diarrhea.  No overt GI bleeding.  Her weight has been stable.  No significant abdominal pains.  Old Data Reviewed: She had a colonoscopy in 2005 by Dr. Maurene Barnes and no polyps were found.  She was recommended to have a repeat colonoscopy at 10-year interval.  She had a colonoscopy in 10/2013 in [place].  I reviewed that report.  There was no mention of why the test was being done.  No polyps were found.  She was recommended to have a repeat colonoscopy in 5 years but I do not know why that was recommended to her.   Review of systems: Pertinent positive and negative review of systems were noted in the above HPI section. All other review negative.   Past Medical History:  Diagnosis Date  . Allergy   . Diverticulitis   . Hypertension   . LBBB (left bundle branch block)   . Migraine    history    Past Surgical History:  Procedure Laterality Date  . CESAREAN SECTION    . CHOLECYSTECTOMY    . FRACTURE SURGERY    . PARTIAL HYSTERECTOMY     Abdominal  . ROTATOR CUFF REPAIR Left   . ROTATOR CUFF REPAIR Right     Current Outpatient Medications  Medication Sig Dispense Refill  . amLODipine (NORVASC) 10 MG tablet Take 1 tablet (10 mg total) by mouth daily. 90 tablet 2  . Calcium Carb-Cholecalciferol 600-800 MG-UNIT TABS Take by mouth.    . Calcium Citrate-Vitamin D (CALCIUM + D PO) Take 600 mg by mouth 2 (two) times daily.    . cetirizine (ZYRTEC) 10 MG tablet Take 10 mg by mouth daily.    Marland Kitchen docusate sodium (COLACE) 100 MG capsule Take 1 capsule (100 mg total) by mouth 2 (two) times daily. 30 capsule 0  . Multiple Vitamin  (MULTIVITAMIN) capsule Take 1 capsule by mouth daily.    . valACYclovir (VALTREX) 1000 MG tablet Take 2 tablets by mouth as needed upon acute onset and asap repeat dose in 12 hours 16 tablet 5   No current facility-administered medications for this visit.     Allergies as of 11/11/2018 - Review Complete 11/11/2018  Allergen Reaction Noted  . Penicillins Itching and Rash 03/17/2007  . Seasonal ic [cholestatin] Other (See Comments) 02/07/2016  . Tape Other (See Comments) 05/08/2016  . Latex Itching and Rash 09/30/2013    Family History  Problem Relation Age of Onset  . Hypertension Father   . Diabetes Father        adult onset  . Colon cancer Father   . Hypertension Mother   . Breast cancer Mother        breast  . Cancer Mother        breast  . Asthma Maternal Grandfather   . Hypertension Brother   . Diabetes Brother   . Stomach cancer Neg Hx   . Pancreatic cancer Neg Hx     Social History   Socioeconomic History  . Marital status: Single    Spouse name: Not on file  . Number of children: Not on file  .  Years of education: Not on file  . Highest education level: Not on file  Occupational History  . Occupation: ophthalmology @ [employer]    Employer: [employer]  . Financial resource strain: Not on file  . Food insecurity:    Worry: Not on file    Inability: Not on file  . Transportation needs:    Medical: Not on file    Non-medical: Not on file  Tobacco Use  . Smoking status: Never Smoker  . Smokeless tobacco: Never Used  Substance and Sexual Activity  . Alcohol use: No  . Drug use: No  . Sexual activity: Never  Lifestyle  . Physical activity:    Days per week: Not on file    Minutes per session: Not on file  . Stress: Not on file  Relationships  . Social connections:    Talks on phone: Not on file    Gets together: Not on file    Attends religious service: Not on file    Active member of club or organization: Not on file    Attends  meetings of clubs or organizations: Not on file    Relationship status: Not on file  . Intimate partner violence:    Fear of current or ex partner: Not on file    Emotionally abused: Not on file    Physically abused: Not on file    Forced sexual activity: Not on file  Other Topics Concern  . Not on file  Social History Narrative  . Not on file     Physical Exam: BP 114/60   Pulse 77   Ht 5\' 1"  (1.549 m)   Wt 161 lb (73 kg)   BMI 30.42 kg/m  Constitutional: generally well-appearing Psychiatric: alert and oriented x3 Eyes: extraocular movements intact Mouth: oral pharynx moist, no lesions Neck: supple no lymphadenopathy Cardiovascular: heart regular rate and rhythm Lungs: clear to auscultation bilaterally Abdomen: soft, nontender, nondistended, no obvious ascites, no peritoneal signs, normal bowel sounds Extremities: no lower extremity edema bilaterally Skin: no lesions on visible extremities   Assessment and plan: 68 y.o. female with family history of colon cancer  We had a very nice discussion about screening, surveillance for colon cancer.  Specifically we discussed current national accepted colon cancer screening guidelines which state that family history is significant if the first-degree relative is diagnosed with colon cancer at or before the age of 84.  Her father was diagnosed with colon cancer at 62 and so she understands she is at routine risk for colon cancer.  We will put her in our reminder system for repeat colonoscopy March 2025 which would be 10 years from her last one.  She understands if she has any significant issues prior to then she should call.  I also warned her that many primary care offices are still performing annual Hemoccult testing despite patient's being under colonoscopy screening programs.  I advised her to politely decline annual Hemoccult testing if it is for colon cancer screening.    Please see the "Patient Instructions" section for addition  details about the plan.   Cheryl Loffler, MD [facility] 11/11/2018, 10:01 AM  Cc: [name], [name], MD

## 2018-12-02 ENCOUNTER — Ambulatory Visit: Payer: Medicare Other

## 2018-12-02 ENCOUNTER — Telehealth: Payer: Self-pay | Admitting: *Deleted

## 2018-12-02 NOTE — Telephone Encounter (Signed)
Copied from [place] [id number]. Topic: General - Inquiry >> Dec 02, 2018  4:20 PM Selinda Flavin B, NT wrote: Reason for CRM: Patient calling and states job is requiring a letter stating that she is high risk for COVID 19 due to her age. Would like it sent to MyChart, home address and faxed to employer. Fax#: [phone] Attn: Florestine Avers CB#: [phone]

## 2018-12-03 NOTE — Telephone Encounter (Signed)
Spoke with patient and informed her that per policy she needed to schedule a virtual visit for notes for work. Patient verbalized understanding, but stated that she didn't understand why she just couldn't get a statement saying that she cannot work due to her high risk. Patient will call office back once she notify her job that she has to do a visit.

## 2018-12-03 NOTE — Telephone Encounter (Signed)
She is not at "high risk for COVID-19." We all have same risk but depending of age and risk factors some people can developed complications.So I can not provide a letter stating she she is at "high risk for COVID-19."    We could provide a letter saying that if she is in contact with an infected patient and develops the disease, she may have risk for complications due to her age.  Thanks, BJ

## 2018-12-04 ENCOUNTER — Encounter: Payer: Self-pay | Admitting: *Deleted

## 2018-12-04 NOTE — Telephone Encounter (Signed)
Left message for patient to return call to clinic concerning letter.

## 2018-12-04 NOTE — Telephone Encounter (Signed)
Patient informed. Letter faxed to Florestine Avers and sent to address on file per patient request.

## 2019-02-11 IMAGING — CT CT ANGIO CHEST
2 of 6 series · 18 of 36 positions shown · IV contrast (ISOVUE 370)
Comparison: Chest radiograph performed 01/24/2017

CLINICAL DATA: Acute onset of mid chest pain and tightness. Initial
encounter.

EXAM:
CT ANGIOGRAPHY CHEST WITH CONTRAST
TECHNIQUE: Multidetector CT imaging of the chest was performed using the
standard protocol during bolus administration of intravenous
contrast. Multiplanar CT image reconstructions and MIPs were
obtained to evaluate the vascular anatomy.
CONTRAST:  100 mL of Isovue 370 IV contrast

[Series 5: coronal mpr · coronal · 0.50mm/px · 1 of 123 slices shown]
[im 62/123  mediastinal]
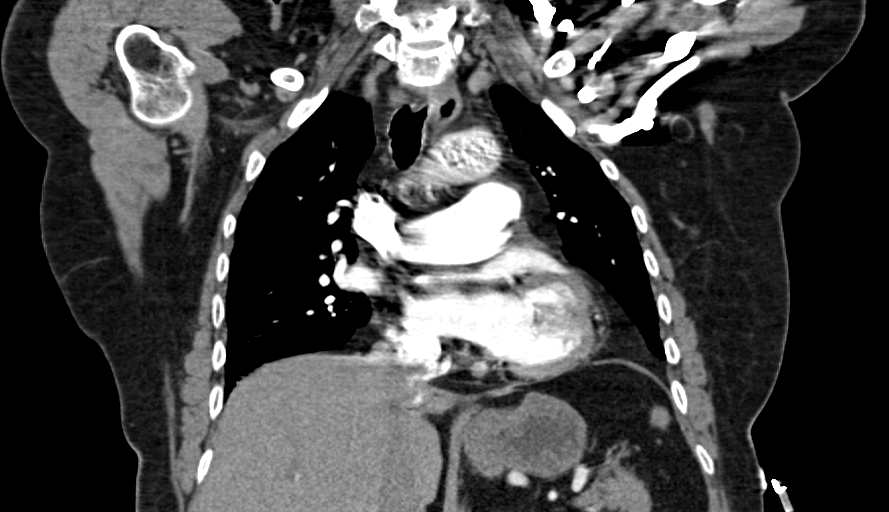

[Series 10: thins for pacs · axial · 0.64mm/px · z∈[-228,+2]mm · 17 of 256 slices shown]
[im 13/256  lung]
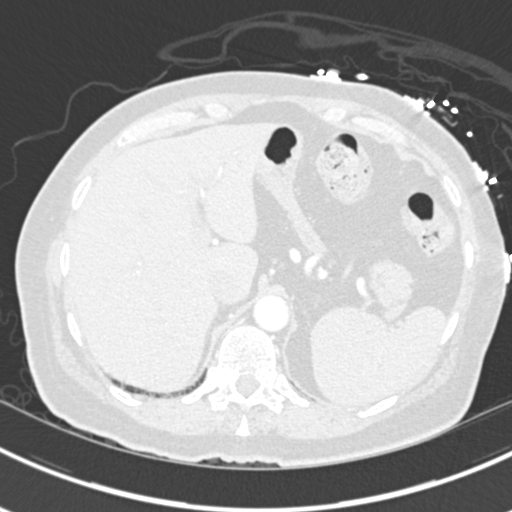
[im 26/256  mediastinal]
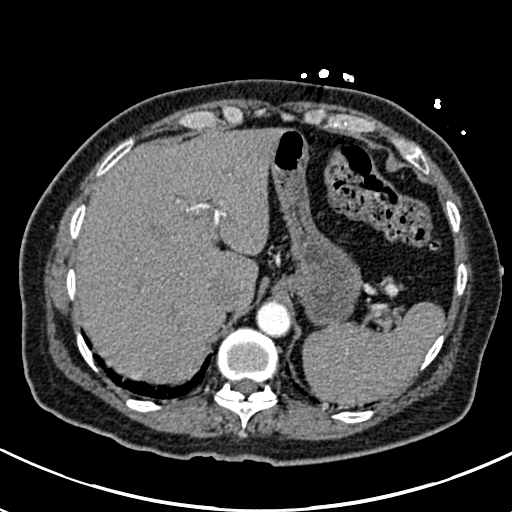
[im 39/256  lung]
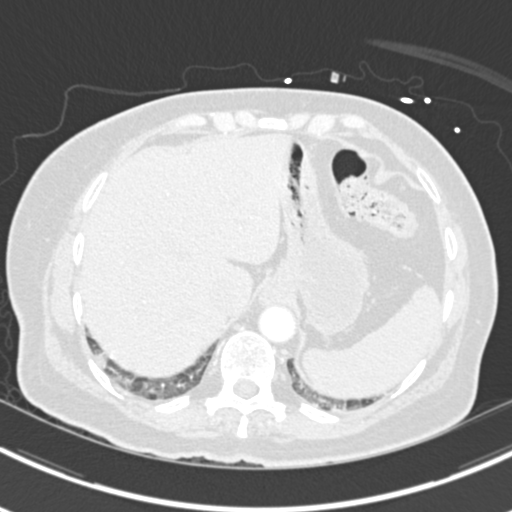
[im 52/256  mediastinal]
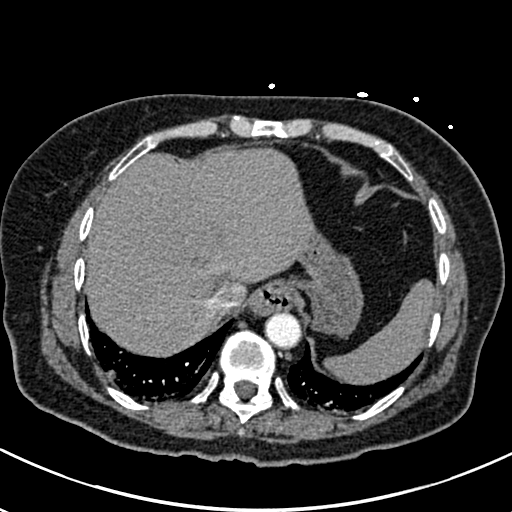
[im 77/256  lung]
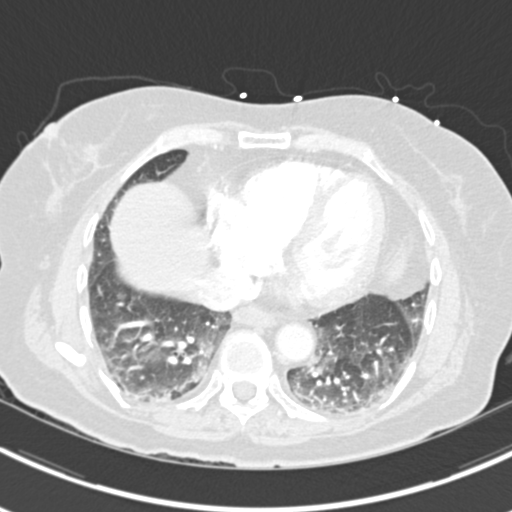
[im 90/256  mediastinal]
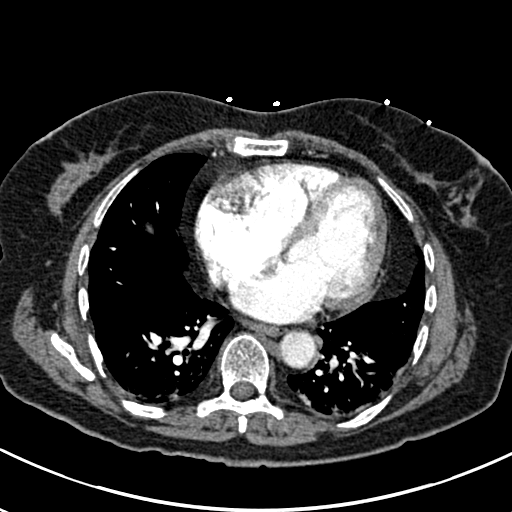
[im 103/256  lung]
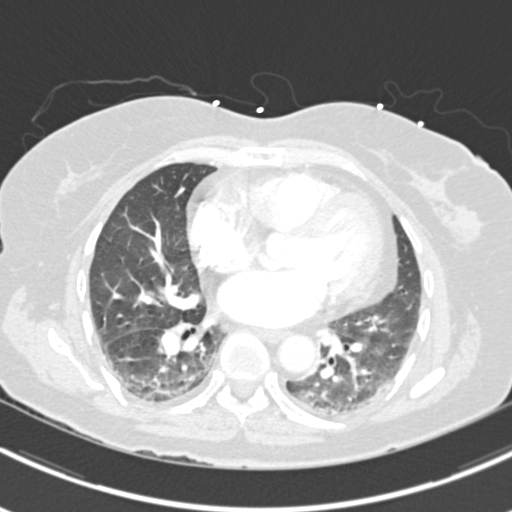
[im 115/256  mediastinal]
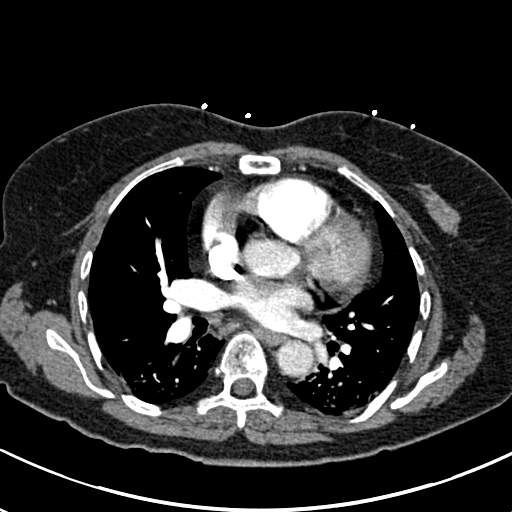
[im 128/256  lung]
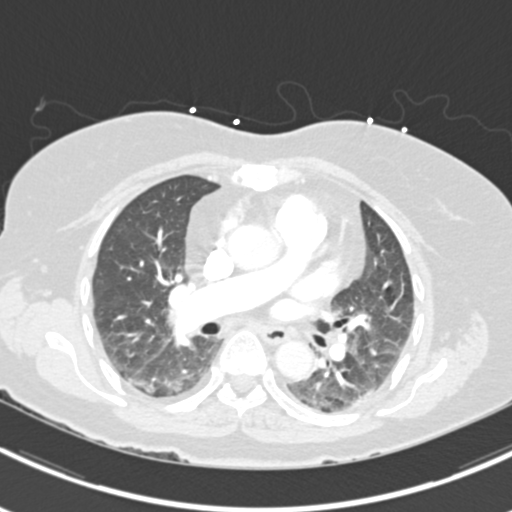
[im 141/256  mediastinal]
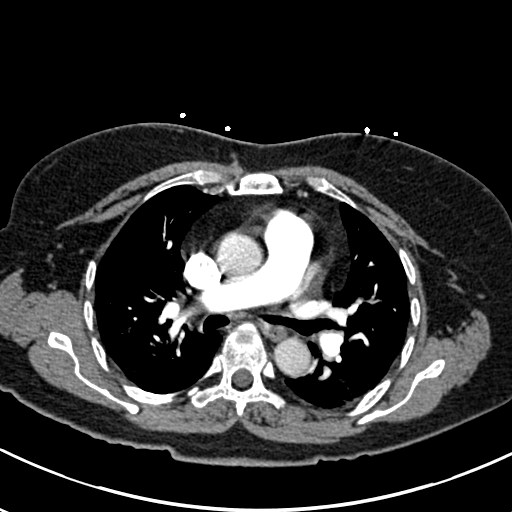
[im 154/256  lung]
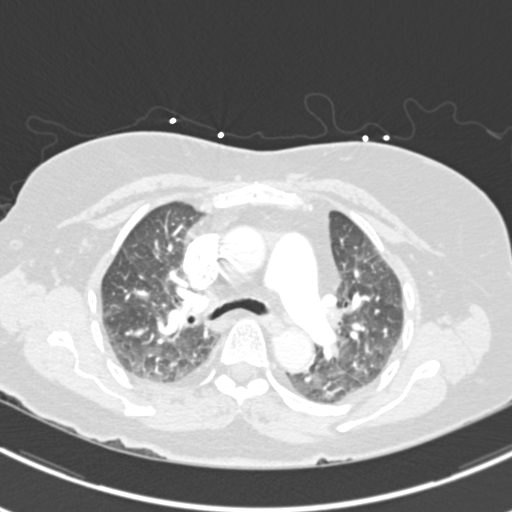
[im 166/256  mediastinal]
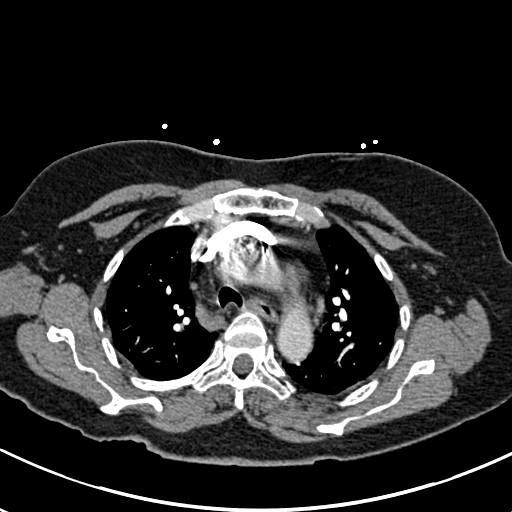
[im 179/256  lung]
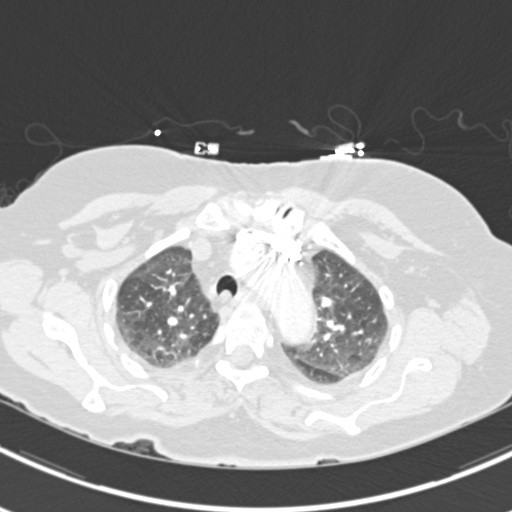
[im 205/256  mediastinal]
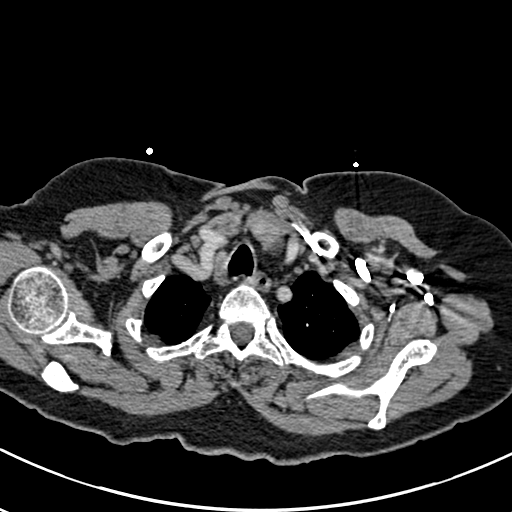
[im 217/256  lung]
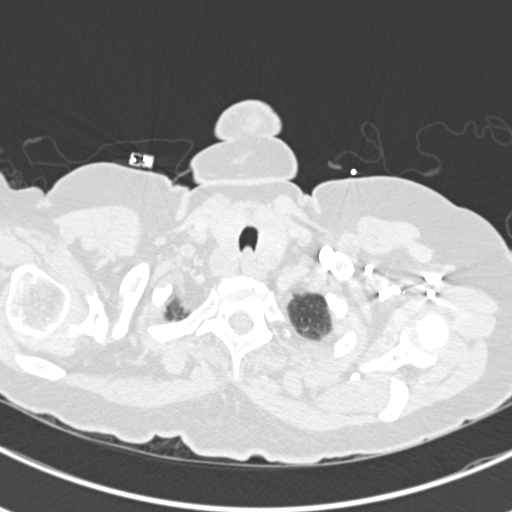
[im 230/256  mediastinal]
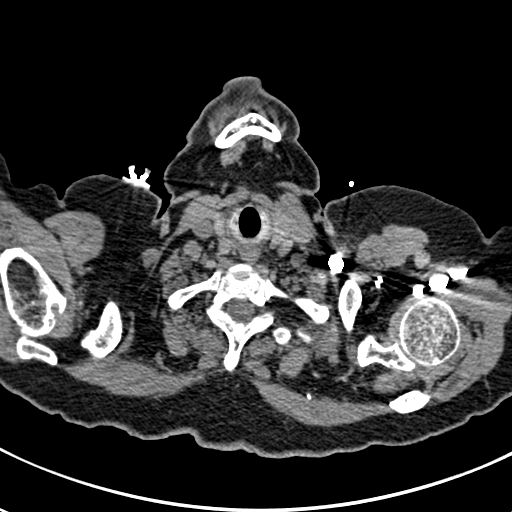
[im 243/256  lung]
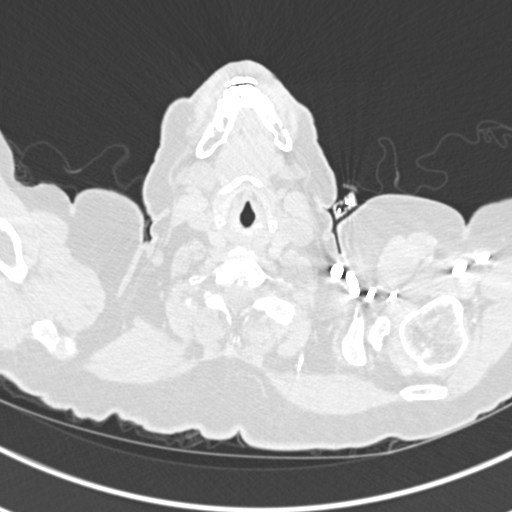

[18 of 36 positions shown; findings below may reference images not displayed]

FINDINGS: Cardiovascular:  There is no evidence of pulmonary embolus.

The heart is normal in size. The thoracic aorta is grossly
unremarkable. The great vessels are within normal limits.

Mediastinum/Nodes: Visualized mediastinal nodes remain normal in
size. No pericardial effusion is identified. A 2.3 cm heterogeneous
mass is noted arising at the posterior aspect of the right thyroid
lobe. No axillary lymphadenopathy is appreciated.

Lungs/Pleura: Bibasilar atelectasis is noted, with mild peripheral
scarring. No pleural effusion or pneumothorax is seen. No masses are
identified.

Upper Abdomen: The visualized portions of the liver and spleen are
grossly unremarkable. A tiny hiatal hernia is noted. The visualized
portions of the pancreas and adrenal glands are within normal
limits.

Musculoskeletal: There is question of a vague sclerotic lesion at
the left side of vertebral body T7, of uncertain significance. The
visualized musculature is unremarkable in appearance.

Review of the MIP images confirms the above findings.
IMPRESSION: 1. No evidence of pulmonary embolus.
2. Bibasilar atelectasis, with mild peripheral scarring.
3. **An incidental finding of potential clinical significance has
been found. 2.3 cm heterogeneous mass arising at the posterior
aspect of the right thyroid lobe. Recommend further evaluation with
thyroid ultrasound. If patient is clinically hyperthyroid, consider
nuclear medicine thyroid uptake and scan.**
4. Question of vague sclerotic lesion at the left-sided vertebral
body T7, of uncertain significance. Bone scan would be helpful for
further evaluation, when and as deemed clinically appropriate.

## 2019-03-10 ENCOUNTER — Other Ambulatory Visit: Payer: Self-pay

## 2019-03-10 ENCOUNTER — Ambulatory Visit
Admission: RE | Admit: 2019-03-10 | Discharge: 2019-03-10 | Disposition: A | Discharge status: Home / Self Care | Payer: Medicare Other | Source: Ambulatory Visit | Attending: Family Medicine | Admitting: Family Medicine

## 2019-03-10 DIAGNOSIS — Z1231 Encounter for screening mammogram for malignant neoplasm of breast: Secondary | ICD-10-CM

## 2019-03-13 ENCOUNTER — Telehealth: Payer: Self-pay

## 2019-03-13 NOTE — Telephone Encounter (Signed)
Please advise 

## 2019-03-13 NOTE — Telephone Encounter (Signed)
Copied from Benjamin Perez [id number]. Topic: General - Inquiry >> Mar 12, 2019  3:37 PM Richardo Priest, NT wrote: Reason for CRM: Patient calling in stating she would like advice on what the next step should be regarding her swelling feet and ankles. States last time she spoke with PCP, compression socks were advised. Patient states socks are too hot and becoming harder to take off and put on. She would like to know if she should contact her cardiologist or is in need of another visit with PCP. Please advise. Patient would like message on mychart and phone call to discuss.

## 2019-03-13 NOTE — Telephone Encounter (Signed)
I think we discussed this problem during her last physical,09/2018.  We discussed possible etiologies, I thought it was benign and most likely related to vein disease, in which case treatment is compression stockings and elevation. Amlodipine could also be contributing to this problem, so we can consider changing amlodipine for hydrochlorothiazide 25 mg daily. If she decides to change antihypertensive medication, we need to follow-up in 3 months and check potassium.  Thanks, BJ

## 2019-03-16 NOTE — Telephone Encounter (Signed)
Patient given recommendations per Dr. [name] and verbalized understanding. Before switching medications,patient would like to know if she could take Coreg and amlodipine together. Patient was on coreg one time before and was not having any issues with swelling. Patient informed that PCP is on vacation until next week, but stated that she would check messages every other day and there may be a 3 day delay in responses to messages. Patient stated that she would wait for recommendations from Dr. [name] before deciding on medication change.

## 2019-03-23 NOTE — Telephone Encounter (Signed)
I do not recommend Coreg at this time given her history of LBBB. We have other antihypertensive medications we can try, losartan 25 mg daily can be started and amlodipine decreased from 10 mg to 5 mg. BMP in 7 to 10 days. Follow-up appointment in 4 weeks. Thanks, BJ

## 2019-03-24 DIAGNOSIS — L821 Other seborrheic keratosis: Secondary | ICD-10-CM | POA: Diagnosis not present

## 2019-03-24 DIAGNOSIS — D229 Melanocytic nevi, unspecified: Secondary | ICD-10-CM | POA: Diagnosis not present

## 2019-03-25 ENCOUNTER — Other Ambulatory Visit: Payer: Self-pay

## 2019-03-25 MED ORDER — LOSARTAN POTASSIUM 25 MG PO TABS: 25.0000 mg | ORAL_TABLET | Freq: Every day | ORAL | 3 refills | Status: DC

## 2019-03-25 MED ORDER — AMLODIPINE BESYLATE 5 MG PO TABS: 5.0000 mg | ORAL_TABLET | Freq: Every day | ORAL | 2 refills | Status: DC

## 2019-04-02 ENCOUNTER — Telehealth: Payer: Self-pay | Admitting: Family Medicine

## 2019-04-02 NOTE — Telephone Encounter (Signed)
Pt called the office and would like to have labs done.  Can we please has lab orders?

## 2019-04-03 NOTE — Telephone Encounter (Signed)
Patient also wants to know when her next appt should be when she is called back.

## 2019-04-10 ENCOUNTER — Other Ambulatory Visit (INDEPENDENT_AMBULATORY_CARE_PROVIDER_SITE_OTHER): Payer: Medicare Other

## 2019-04-10 ENCOUNTER — Other Ambulatory Visit: Payer: Self-pay

## 2019-04-10 ENCOUNTER — Other Ambulatory Visit: Payer: Self-pay | Admitting: *Deleted

## 2019-04-10 DIAGNOSIS — I10 Essential (primary) hypertension: Secondary | ICD-10-CM

## 2019-04-10 LAB — BASIC METABOLIC PANEL
BUN: 18 mg/dL (ref 6–23)
CO2: 28 mEq/L (ref 19–32)
Calcium: 9.5 mg/dL (ref 8.4–10.5)
Chloride: 104 mEq/L (ref 96–112)
Creatinine, Ser: 0.71 mg/dL (ref 0.40–1.20)
GFR: 82.02 mL/min (ref >60.00)
Glucose, Bld: 84 mg/dL (ref 70–99)
Potassium: 4 mEq/L (ref 3.5–5.1)
Sodium: 140 mEq/L (ref 135–145)

## 2019-04-10 NOTE — Telephone Encounter (Signed)
Spoke with patient and informed her that it was not too late to have lab done. BMP ordered and lab appointment made. Nothing further needed at this time.

## 2019-04-10 NOTE — Telephone Encounter (Signed)
Attempted to reach the office, now answer.  Patient is calling back to schedule lab appt per Dr. [name].  However, the order has not been placed. One previous message was placed to request order. However, order has not been placed. Patient is not happy with the process. Please advise   Patient is requesting a CB- [phone] when an order is placed.   Dr. [name] requesting 7-10 day to review medication. It now has been 17 days?  Is it too late for the labs for what Dr. [name] was checking for?  CB- [phone]

## 2019-04-13 ENCOUNTER — Encounter: Payer: Self-pay | Admitting: Family Medicine

## 2019-05-08 ENCOUNTER — Other Ambulatory Visit: Payer: Self-pay

## 2019-05-08 ENCOUNTER — Encounter: Payer: Self-pay | Admitting: Family Medicine

## 2019-05-08 ENCOUNTER — Ambulatory Visit (INDEPENDENT_AMBULATORY_CARE_PROVIDER_SITE_OTHER): Payer: Medicare Other | Admitting: Family Medicine

## 2019-05-08 VITALS — BP 128/82 | HR 81 | Temp 97.9°F | Resp 12 | Ht 61.0 in | Wt 159.4 lb

## 2019-05-08 DIAGNOSIS — Z23 Encounter for immunization: Secondary | ICD-10-CM | POA: Diagnosis not present

## 2019-05-08 DIAGNOSIS — I10 Essential (primary) hypertension: Secondary | ICD-10-CM

## 2019-05-08 DIAGNOSIS — R600 Localized edema: Secondary | ICD-10-CM | POA: Diagnosis not present

## 2019-05-08 NOTE — Progress Notes (Signed)
HPI:   Cheryl Barnes is a 68 y.o. female, who is here today for chronic disease management.  Recently antihypertensive medications were adjusted because she was concerned about worsening lower extremity edema. States that her shoes did not fit at the end of the day. No erythema or pain. Amlodipine dose was decreased from 10 mg to 5 mg.  She is on amlodipine 5 mg daily and losartan 25 mg daily. Edema has greatly improved, back to "normal."  Lab Results  Component Value Date   CREATININE 0.71 04/10/2019   BUN 18 04/10/2019   NA 140 04/10/2019   K 4.0 04/10/2019   CL 104 04/10/2019   CO2 28 04/10/2019   She has tolerated medication well.  She has some questions about COVID 19 and possible vaccine.  Review of Systems  Constitutional: Negative for appetite change, fatigue, fever and unexpected weight change.  HENT: Negative for mouth sores and nosebleeds.   Respiratory: Negative for cough, wheezing and stridor.   Gastrointestinal: Negative for abdominal pain, nausea and vomiting.  Genitourinary: Negative for decreased urine volume, dysuria and hematuria.  Neurological: Negative for syncope and facial asymmetry.  Psychiatric/Behavioral: Negative for confusion. The patient is nervous/anxious.   Rest see pertinent positives and negatives per HPI.   Current Outpatient Medications on File Prior to Visit  Medication Sig Dispense Refill  . amLODipine (NORVASC) 5 MG tablet Take 1 tablet (5 mg total) by mouth daily. 90 tablet 2  . Calcium Citrate-Vitamin D (CALCIUM + D PO) Take 600 mg by mouth 2 (two) times daily.    . cetirizine (ZYRTEC) 10 MG tablet Take 10 mg by mouth daily.    Marland Kitchen docusate sodium (COLACE) 100 MG capsule Take 1 capsule (100 mg total) by mouth 2 (two) times daily. 30 capsule 0  . losartan (COZAAR) 25 MG tablet Take 1 tablet (25 mg total) by mouth daily. 90 tablet 3  . Multiple Vitamin (MULTIVITAMIN) capsule Take 1 capsule by mouth daily.    .  valACYclovir (VALTREX) 1000 MG tablet Take 2 tablets by mouth as needed upon acute onset and asap repeat dose in 12 hours 16 tablet 5  . Calcium Carb-Cholecalciferol 600-800 MG-UNIT TABS Take by mouth.     No current facility-administered medications on file prior to visit.      Past Medical History:  Diagnosis Date  . Allergy   . Diverticulitis   . Hypertension   . LBBB (left bundle branch block)   . Migraine    history   Allergies  Allergen Reactions  . Penicillins Itching and Rash    Has patient had a PCN reaction causing immediate rash, facial/tongue/throat swelling, SOB or lightheadedness with hypotension: Yes Has patient had a PCN reaction causing severe rash involving mucus membranes or skin necrosis: No Has patient had a PCN reaction that required hospitalization No Has patient had a PCN reaction occurring within the last 10 years: No If all of the above answers are "NO", then may proceed with Cephalosporin use.   . Seasonal Ic [Cholestatin] Other (See Comments)    Upper resp, sore throat, sneezing, etc.  . Tape Other (See Comments)    Tape causes some redness & causes BLISTERS if left on for too long  . Latex Itching and Rash    Social History   Socioeconomic History  . Marital status: Single    Spouse name: Not on file  . Number of children: Not on file  . Years of education:  Not on file  . Highest education level: Not on file  Occupational History  . Occupation: ophthalmology @ [employer]    Employer: [employer]  . Financial resource strain: Not on file  . Food insecurity    Worry: Not on file    Inability: Not on file  . Transportation needs    Medical: Not on file    Non-medical: Not on file  Tobacco Use  . Smoking status: Never Smoker  . Smokeless tobacco: Never Used  Substance and Sexual Activity  . Alcohol use: No  . Drug use: No  . Sexual activity: Never  Lifestyle  . Physical activity    Days per week: Not on file    Minutes  per session: Not on file  . Stress: Not on file  Relationships  . Social Herbalist on phone: Not on file    Gets together: Not on file    Attends religious service: Not on file    Active member of club or organization: Not on file    Attends meetings of clubs or organizations: Not on file    Relationship status: Not on file  Other Topics Concern  . Not on file  Social History Narrative  . Not on file    Vitals:   05/08/19 1132  BP: 128/82  Pulse: 81  Resp: 12  Temp: 97.9 F (36.6 C)  SpO2: 96%   Body mass index is 30.12 kg/m.   Physical Exam  Nursing note and vitals reviewed. Constitutional: She is oriented to person, place, and time. She appears well-developed. No distress.  HENT:  Head: Normocephalic and atraumatic.  Mouth/Throat: Oropharynx is clear and moist and mucous membranes are normal.  Eyes: Pupils are equal, round, and reactive to light. Conjunctivae are normal.  Cardiovascular: Normal rate and regular rhythm.  No murmur heard. Pulses:      Dorsalis pedis pulses are 2+ on the right side and 2+ on the left side.  Respiratory: Effort normal and breath sounds normal. No respiratory distress.  GI: Soft. She exhibits no mass. There is no hepatomegaly. There is no abdominal tenderness.  Musculoskeletal:        General: No edema.  Lymphadenopathy:    She has no cervical adenopathy.  Neurological: She is alert and oriented to person, place, and time. She has normal strength. No cranial nerve deficit. Gait normal.  Skin: Skin is warm. No rash noted. No erythema.  Psychiatric: Her mood appears anxious.  Well groomed, good eye contact.    ASSESSMENT AND PLAN:  Cheryl Barnes was seen today for follow-up.  Diagnoses and all orders for this visit:  Bilateral lower extremity edema Improved. Possible etiologies discussed. ? Vein disease. LE elevation a few times during the day.  Essential hypertension BP adequately controlled. No changes in  current management. Continue monitoring BP at home.  Need for influenza vaccination -     Flu Vaccine QUAD High Dose(Fluad)   16 min face to face OV. > 50% was dedicated to discussion of above Dx ,side effects. COVID 19 ,symptoms,approved treatments and current vaccine trials.  Return in about 5 months (around 10/08/2019) for medicare and f/u.   Sadie Hazelett G. [name], MD  [facility]. Cheryl Barnes office.

## 2019-05-08 NOTE — Patient Instructions (Signed)
A few things to remember from today's visit:   Bilateral lower extremity edema  Essential hypertension  Need for influenza vaccination - Plan: Flu Vaccine QUAD High Dose(Fluad)  Your blood pressure is very well controlled. It seems like you are good with medications for now, call your pharmacy if you need more refills. I will see you back in February for your physical.   Please be sure medication list is accurate. If a new problem present, please set up appointment sooner than planned today.

## 2019-08-12 ENCOUNTER — Other Ambulatory Visit: Payer: Self-pay

## 2019-08-12 ENCOUNTER — Telehealth: Payer: Self-pay

## 2019-08-12 ENCOUNTER — Encounter: Payer: Self-pay | Admitting: Family Medicine

## 2019-08-12 ENCOUNTER — Telehealth (INDEPENDENT_AMBULATORY_CARE_PROVIDER_SITE_OTHER): Payer: Medicare Other | Admitting: Family Medicine

## 2019-08-12 ENCOUNTER — Ambulatory Visit (INDEPENDENT_AMBULATORY_CARE_PROVIDER_SITE_OTHER): Payer: Medicare Other

## 2019-08-12 ENCOUNTER — Other Ambulatory Visit (INDEPENDENT_AMBULATORY_CARE_PROVIDER_SITE_OTHER): Payer: Medicare Other

## 2019-08-12 VITALS — Ht 61.0 in

## 2019-08-12 DIAGNOSIS — R110 Nausea: Secondary | ICD-10-CM

## 2019-08-12 DIAGNOSIS — M545 Low back pain, unspecified: Secondary | ICD-10-CM

## 2019-08-12 DIAGNOSIS — R34 Anuria and oliguria: Secondary | ICD-10-CM

## 2019-08-12 MED ORDER — ONDANSETRON HCL 4 MG PO TABS: 4.0000 mg | ORAL_TABLET | Freq: Two times a day (BID) | ORAL | 0 refills | Status: AC | PRN

## 2019-08-12 MED ORDER — HYDROCODONE-ACETAMINOPHEN 5-325 MG PO TABS: 1.0000 | ORAL_TABLET | Freq: Three times a day (TID) | ORAL | 0 refills | Status: AC | PRN

## 2019-08-12 MED ORDER — TIZANIDINE HCL 4 MG PO TABS: 4.0000 mg | ORAL_TABLET | Freq: Three times a day (TID) | ORAL | 0 refills | Status: DC | PRN

## 2019-08-12 NOTE — Progress Notes (Signed)
Virtual Visit via Video Note   I connected with Cheryl Barnes on 08/12/19 by a video enabled telemedicine application and verified that I am speaking with the correct person using two identifiers.  Location patient: home Location provider:work office Persons participating in the virtual visit: patient, provider  I discussed the limitations of evaluation and management by telemedicine and the availability of in person appointments. The patient expressed understanding and agreed to proceed.   HPI: Cheryl Barnes is a 68 yo female with Hx of HTN,allergies,and diverticulosis c/o 2 days of severe right-sided back pain. Pain is constant. No Hx of back pain. No Hx of trauma or unusual physical activity.  "So much pain" Movement exacerbating pain. Sharp, alleviated by sitting still and lying down,8/10. 2 days ago right lower back pain, sudden onset. Pain is getting worse. Severe nausea, started after 2nd dose of Oxycodone. Negative for saddle anesthesia, lower extremity numbness/tingling/weakness, or bladder/bowel dysfunction.  Ibuprofen did not help. She took Oxycodone Rx left from prior Rx, and helped some. No fever,chills,sore throat,CP,dyspnea,palpitations,abdominal pain,changes in bowel habits, or vomiting. Negative for skin rash,vaginal bleeding or discharge. No dysuria,increased urinary frequency, or gross hematuria.  She mentioned that she is not urinating as much as she used to despite the fact she is drinking plenty of fluids. Negative for edema, orthopnea, or PND.  ROS: See pertinent positives and negatives per HPI.  Past Medical History:  Diagnosis Date  . Allergy   . Diverticulitis   . Hypertension   . LBBB (left bundle branch block)   . Migraine    history    Past Surgical History:  Procedure Laterality Date  . CESAREAN SECTION    . CHOLECYSTECTOMY    . FRACTURE SURGERY    . PARTIAL HYSTERECTOMY     Abdominal  . ROTATOR CUFF REPAIR Left   . ROTATOR CUFF REPAIR Right      Family History  Problem Relation Age of Onset  . Hypertension Father   . Diabetes Father        adult onset  . Colon cancer Father   . Hypertension Mother   . Breast cancer Mother        breast  . Cancer Mother        breast  . Asthma Maternal Grandfather   . Hypertension Brother   . Diabetes Brother   . Stomach cancer Neg Hx   . Pancreatic cancer Neg Hx     Social History   Socioeconomic History  . Marital status: Single    Spouse name: Not on file  . Number of children: Not on file  . Years of education: Not on file  . Highest education level: Not on file  Occupational History  . Occupation: ophthalmology @ [employer]    Employer: [employer] Use  . Smoking status: Never Smoker  . Smokeless tobacco: Never Used  Substance and Sexual Activity  . Alcohol use: No  . Drug use: No  . Sexual activity: Never  Other Topics Concern  . Not on file  Social History Narrative  . Not on file   Social Determinants of Health   Financial Resource Strain:   . Difficulty of Paying Living Expenses: Not on file  Food Insecurity:   . Worried About Charity fundraiser in the Last Year: Not on file  . Ran Out of Food in the Last Year: Not on file  Transportation Needs:   . Lack of Transportation (Medical): Not on file  . Lack  of Transportation (Non-Medical): Not on file  Physical Activity:   . Days of Exercise per Week: Not on file  . Minutes of Exercise per Session: Not on file  Stress:   . Feeling of Stress : Not on file  Social Connections:   . Frequency of Communication with Friends and Family: Not on file  . Frequency of Social Gatherings with Friends and Family: Not on file  . Attends Religious Services: Not on file  . Active Member of Clubs or Organizations: Not on file  . Attends Archivist Meetings: Not on file  . Marital Status: Not on file  Intimate Partner Violence:   . Fear of Current or Ex-Partner: Not on file  . Emotionally Abused: Not  on file  . Physically Abused: Not on file  . Sexually Abused: Not on file    Current Outpatient Medications:  .  amLODipine (NORVASC) 5 MG tablet, Take 1 tablet (5 mg total) by mouth daily., Disp: 90 tablet, Rfl: 2 .  Calcium Carb-Cholecalciferol 600-800 MG-UNIT TABS, Take by mouth., Disp: , Rfl:  .  Calcium Citrate-Vitamin D (CALCIUM + D PO), Take 600 mg by mouth 2 (two) times daily., Disp: , Rfl:  .  cetirizine (ZYRTEC) 10 MG tablet, Take 10 mg by mouth daily., Disp: , Rfl:  .  docusate sodium (COLACE) 100 MG capsule, Take 1 capsule (100 mg total) by mouth 2 (two) times daily., Disp: 30 capsule, Rfl: 0 .  losartan (COZAAR) 25 MG tablet, Take 1 tablet (25 mg total) by mouth daily., Disp: 90 tablet, Rfl: 3 .  Multiple Vitamin (MULTIVITAMIN) capsule, Take 1 capsule by mouth daily., Disp: , Rfl:  .  valACYclovir (VALTREX) 1000 MG tablet, Take 2 tablets by mouth as needed upon acute onset and asap repeat dose in 12 hours, Disp: 16 tablet, Rfl: 5 .  HYDROcodone-acetaminophen (NORCO/VICODIN) 5-325 MG tablet, Take 1 tablet by mouth every 8 (eight) hours as needed for up to 5 days for moderate pain., Disp: 15 tablet, Rfl: 0 .  ondansetron (ZOFRAN) 4 MG tablet, Take 1 tablet (4 mg total) by mouth every 12 (twelve) hours as needed for up to 5 days for nausea or vomiting., Disp: 10 tablet, Rfl: 0 .  tiZANidine (ZANAFLEX) 4 MG tablet, Take 1 tablet (4 mg total) by mouth every 8 (eight) hours as needed for up to 10 days for muscle spasms., Disp: 30 tablet, Rfl: 0  EXAM:  VITALS per patient if applicable:Ht 5\' 1"  (1.549 m)   BMI 30.12 kg/m   GENERAL: alert, oriented, appears well and in no acute distress  LUNGS: No signs of respiratory distress, breathing rate appears normal, no obvious gross SOB, gasping or wheezing  CV: no obvious cyanosis  PSYCH/NEURO: pleasant and cooperative, no obvious depression or anxiety, speech and thought processing grossly intact  ASSESSMENT AND PLAN:  Discussed the  following assessment and plan:  Acute right-sided low back pain without sciatica - Plan: CBC with Differential/Platelet, Urinalysis, Routine w reflex microscopic, DG Lumbar Spine Complete, HYDROcodone-acetaminophen (NORCO/VICODIN) 5-325 MG tablet History suggest musculoskeletal pain. Because new onset, lumbar imaging will be arranged. Muscle relaxant (Zanaflex 4 mg) to take at bedtime.She can take it 3 times per day but with caution due to drowsiness.  We discussed other possible etiologies. Some side effects of opioids in general discussed.  Nausea without vomiting - Plan: CBC with Differential/Platelet, Basic Metabolic Panel, ondansetron (ZOFRAN) 4 MG tablet We discussed possible causes. It can be related to opioid intake. Frequent  small sips of clear fluids throughout the day. Zofran 4 mg twice daily recommended.  Decreased urine output - Plan: CBC with Differential/Platelet, Basic Metabolic Panel, Urinalysis, Routine w reflex microscopic Continue adequate hydration. Avoid OTC NSAIDs. Further recommendation will be given according to lab results.   Lab appt arranged. She is calling her sister to drive her here to the office.  I discussed the assessment and treatment plan with the patient. She was provided an opportunity to ask questions and all were answered. She agreed with the plan and demonstrated an understanding of the instructions.   The patient was advised to call back or seek an in-person evaluation if the symptoms worsen or if the condition fails to improve as anticipated.  Return in about 2 weeks (around 08/26/2019) for back pain, nausea..    Vi Biddinger [name], MD

## 2019-08-12 NOTE — Telephone Encounter (Signed)
I spoke with pt. She is having some pain in her right kidney. No blood in her urine that she knows of, and no burning. Offered pt a virtual appt with pcp at 2:30 and pt accepted. Walked pt through how to connect and she knows to wait until pcp sends the link.

## 2019-08-12 NOTE — Telephone Encounter (Signed)
Copied from [place] [id number]. Topic: General - Inquiry >> Aug 12, 2019 12:57 PM Alease Frame wrote: Reason for CRM: Pateint is wanting a call back from DR [name] or her nurse . Please advise   Call back number [phone]

## 2019-08-13 ENCOUNTER — Telehealth: Payer: Self-pay | Admitting: *Deleted

## 2019-08-13 LAB — CBC WITH DIFFERENTIAL/PLATELET
Basophils Absolute: 0.1 10*3/uL (ref 0.0–0.1)
Basophils Relative: 0.9 % (ref 0.0–3.0)
Eosinophils Absolute: 0.1 10*3/uL (ref 0.0–0.7)
Eosinophils Relative: 1 % (ref 0.0–5.0)
HCT: 41.2 % (ref 36.0–46.0)
Hemoglobin: 13.7 g/dL (ref 12.0–15.0)
Lymphocytes Relative: 41 % (ref 12.0–46.0)
Lymphs Abs: 2.5 10*3/uL (ref 0.7–4.0)
MCHC: 33.4 g/dL (ref 30.0–36.0)
MCV: 91.5 fl (ref 78.0–100.0)
Monocytes Absolute: 0.8 10*3/uL (ref 0.1–1.0)
Monocytes Relative: 12.2 % — ABNORMAL HIGH (ref 3.0–12.0)
Neutro Abs: 2.8 10*3/uL (ref 1.4–7.7)
Neutrophils Relative %: 44.9 % (ref 43.0–77.0)
Platelets: 168 10*3/uL (ref 150.0–400.0)
RBC: 4.5 Mil/uL (ref 3.87–5.11)
RDW: 13.2 % (ref 11.5–15.5)
WBC: 6.2 10*3/uL (ref 4.0–10.5)

## 2019-08-13 LAB — BASIC METABOLIC PANEL
BUN: 14 mg/dL (ref 6–23)
CO2: 29 mEq/L (ref 19–32)
Calcium: 9.3 mg/dL (ref 8.4–10.5)
Chloride: 103 mEq/L (ref 96–112)
Creatinine, Ser: 0.75 mg/dL (ref 0.40–1.20)
GFR: 76.91 mL/min (ref >60.00)
Glucose, Bld: 98 mg/dL (ref 70–99)
Potassium: 4.2 mEq/L (ref 3.5–5.1)
Sodium: 138 mEq/L (ref 135–145)

## 2019-08-13 LAB — URINALYSIS, ROUTINE W REFLEX MICROSCOPIC
Bilirubin Urine: NEGATIVE
Hgb urine dipstick: NEGATIVE
Ketones, ur: NEGATIVE
Leukocytes,Ua: NEGATIVE
Nitrite: NEGATIVE
RBC / HPF: NONE SEEN (ref 0–?)
Specific Gravity, Urine: 1.02 (ref 1.000–1.030)
Total Protein, Urine: NEGATIVE
Urine Glucose: NEGATIVE
Urobilinogen, UA: 0.2 (ref 0.0–1.0)
pH: 6.5 (ref 5.0–8.0)

## 2019-08-13 NOTE — Progress Notes (Signed)
Patient is scheduled for a virtual appointment on 12/30 at 2 PM

## 2019-08-13 NOTE — Telephone Encounter (Signed)
Copied from [place] [id number]. Topic: General - Call Back - No Documentation >> Aug 13, 2019 10:38 AM Erick Blinks wrote: Reason for CRM: Pt wants to discuss lab results/ Xray imaging with clinic once they are ready. Please advise  Best contact: [phone]

## 2019-08-14 ENCOUNTER — Encounter: Payer: Self-pay | Admitting: Family Medicine

## 2019-08-14 NOTE — Telephone Encounter (Signed)
See my chart message

## 2019-08-14 NOTE — Telephone Encounter (Signed)
Do you have her labs/x-ray back?

## 2019-08-14 NOTE — Telephone Encounter (Signed)
Will notify pt as soon as I have the results back.

## 2019-08-16 ENCOUNTER — Encounter: Payer: Self-pay | Admitting: Family Medicine

## 2019-08-19 ENCOUNTER — Other Ambulatory Visit: Payer: Self-pay | Admitting: Family Medicine

## 2019-08-19 DIAGNOSIS — M545 Low back pain, unspecified: Secondary | ICD-10-CM

## 2019-08-26 ENCOUNTER — Encounter: Payer: Self-pay | Admitting: Family Medicine

## 2019-08-26 ENCOUNTER — Telehealth (INDEPENDENT_AMBULATORY_CARE_PROVIDER_SITE_OTHER): Payer: Medicare Other | Admitting: Family Medicine

## 2019-08-26 DIAGNOSIS — R110 Nausea: Secondary | ICD-10-CM | POA: Diagnosis not present

## 2019-08-26 DIAGNOSIS — M545 Low back pain, unspecified: Secondary | ICD-10-CM

## 2019-08-26 DIAGNOSIS — K5900 Constipation, unspecified: Secondary | ICD-10-CM | POA: Diagnosis not present

## 2019-08-26 NOTE — Progress Notes (Signed)
Virtual Visit via Video Note   I connected with Cheryl Barnes on 08/26/19 by a video enabled telemedicine application and verified that I am speaking with the correct person using two identifiers.  Location patient: home Location provider:work office Persons participating in the virtual visit: patient, provider  I discussed the limitations of evaluation and management by telemedicine and the availability of in person appointments. The patient expressed understanding and agreed to proceed.   HPI: Cheryl Barnes is a 68 yo female with hx of HTN and diverticulosis who is following on a recent visit,08/12/19, when she was having severe (10/10),new onset right-sided lower back pain associated with nausea.  Lumbar X ray done on 08/12/19:  1. Diffuse degenerative changes lumbar spine and both hips. Degenerative changes, SI joints, and both hips. No acute abnormality. 2. Large amount of stool noted throughout the colon. Constipation could present this fashion.  Lab Results  Component Value Date   WBC 6.2 08/12/2019   HGB 13.7 08/12/2019   HCT 41.2 08/12/2019   MCV 91.5 08/12/2019   PLT 168.0 08/12/2019   Lab Results  Component Value Date   CREATININE 0.75 08/12/2019   BUN 14 08/12/2019   NA 138 08/12/2019   K 4.2 08/12/2019   CL 103 08/12/2019   CO2 29 08/12/2019   She is no longer on hydrocodone-acetaminophen, which helped with pain and it was well tolerated. Zanaflex 4 mg 3 times daily as needed, last refill was sent on 08/20/2019.  Back pain is "much better", 0/10. Appetite is normal. Last bowel movement this morning. Negative for fever, chills, sore throat, cough, wheezing, dyspnea, abdominal pain, N/V, melena, and blood in the stool. No urinary symptoms.  All symptoms resolved, she is back to her baseline. She has no other concerns today.  ROS: See pertinent positives and negatives per HPI.  Past Medical History:  Diagnosis Date  . Allergy   . Diverticulitis   . Hypertension    . LBBB (left bundle branch block)   . Migraine    history    Past Surgical History:  Procedure Laterality Date  . CESAREAN SECTION    . CHOLECYSTECTOMY    . FRACTURE SURGERY    . PARTIAL HYSTERECTOMY     Abdominal  . ROTATOR CUFF REPAIR Left   . ROTATOR CUFF REPAIR Right     Family History  Problem Relation Age of Onset  . Hypertension Father   . Diabetes Father        adult onset  . Colon cancer Father   . Hypertension Mother   . Breast cancer Mother        breast  . Cancer Mother        breast  . Asthma Maternal Grandfather   . Hypertension Brother   . Diabetes Brother   . Stomach cancer Neg Hx   . Pancreatic cancer Neg Hx     Social History   Socioeconomic History  . Marital status: Single    Spouse name: Not on file  . Number of children: Not on file  . Years of education: Not on file  . Highest education level: Not on file  Occupational History  . Occupation: ophthalmology @ [employer]    Employer: [employer]  . Smoking status: Never Smoker  . Smokeless tobacco: Never Used  Substance and Sexual Activity  . Alcohol use: No  . Drug use: No  . Sexual activity: Never  Other Topics Concern  . Not on file  Social History Narrative  . Not on file   Social Determinants of Health   Financial Resource Strain:   . Difficulty of Paying Living Expenses: Not on file  Food Insecurity:   . Worried About Charity fundraiser in the Last Year: Not on file  . Ran Out of Food in the Last Year: Not on file  Transportation Needs:   . Lack of Transportation (Medical): Not on file  . Lack of Transportation (Non-Medical): Not on file  Physical Activity:   . Days of Exercise per Week: Not on file  . Minutes of Exercise per Session: Not on file  Stress:   . Feeling of Stress : Not on file  Social Connections:   . Frequency of Communication with Friends and Family: Not on file  . Frequency of Social Gatherings with Friends and Family: Not on file  .  Attends Religious Services: Not on file  . Active Member of Clubs or Organizations: Not on file  . Attends Archivist Meetings: Not on file  . Marital Status: Not on file  Intimate Partner Violence:   . Fear of Current or Ex-Partner: Not on file  . Emotionally Abused: Not on file  . Physically Abused: Not on file  . Sexually Abused: Not on file    Current Outpatient Medications:  .  amLODipine (NORVASC) 5 MG tablet, Take 1 tablet (5 mg total) by mouth daily., Disp: 90 tablet, Rfl: 2 .  Calcium Carb-Cholecalciferol 600-800 MG-UNIT TABS, Take by mouth., Disp: , Rfl:  .  Calcium Citrate-Vitamin D (CALCIUM + D PO), Take 600 mg by mouth 2 (two) times daily., Disp: , Rfl:  .  cetirizine (ZYRTEC) 10 MG tablet, Take 10 mg by mouth daily., Disp: , Rfl:  .  docusate sodium (COLACE) 100 MG capsule, Take 1 capsule (100 mg total) by mouth 2 (two) times daily., Disp: 30 capsule, Rfl: 0 .  losartan (COZAAR) 25 MG tablet, Take 1 tablet (25 mg total) by mouth daily., Disp: 90 tablet, Rfl: 3 .  Multiple Vitamin (MULTIVITAMIN) capsule, Take 1 capsule by mouth daily., Disp: , Rfl:  .  tiZANidine (ZANAFLEX) 4 MG tablet, TAKE 1 TABLET(4 MG) BY MOUTH EVERY 8 HOURS FOR UP TO 10 DAYS AS NEEDED FOR MUSCLE SPASMS, Disp: 30 tablet, Rfl: 0 .  valACYclovir (VALTREX) 1000 MG tablet, Take 2 tablets by mouth as needed upon acute onset and asap repeat dose in 12 hours, Disp: 16 tablet, Rfl: 5  EXAM:  VITALS per patient if applicable:N/A  GENERAL: alert, oriented, appears well and in no acute distress  LUNGS: No signs of respiratory distress, breathing rate appears normal, no obvious gross SOB, gasping or wheezing  PSYCH/NEURO: pleasant and cooperative, no obvious depression or anxiety, speech and thought processing grossly intact  ASSESSMENT AND PLAN:  Discussed the following assessment and plan:  Acute right-sided low back pain without sciatica Resolved. She can take Zanaflex 4 mg at bedtime if  problem presents in the future.  Nausea without vomiting Resolved.  Constipation, unspecified constipation type Improved. This problem could have been a contributing factor for the above problems. Continue adequate fiber and fluid intake. Benefiber 1 teaspoon twice daily. MiraLAX daily as needed.     I discussed the assessment and treatment plan with the patient. She was provided an opportunity to ask questions and all were answered. She agreed with the plan and demonstrated an understanding of the instructions.   Return if symptoms worsen or fail to improve, for  Keep next appt..    Mykalah Saari [name], MD

## 2019-10-08 ENCOUNTER — Other Ambulatory Visit: Payer: Self-pay

## 2019-10-08 DIAGNOSIS — R635 Abnormal weight gain: Secondary | ICD-10-CM | POA: Diagnosis not present

## 2019-10-08 DIAGNOSIS — I10 Essential (primary) hypertension: Secondary | ICD-10-CM | POA: Diagnosis not present

## 2019-10-08 DIAGNOSIS — E039 Hypothyroidism, unspecified: Secondary | ICD-10-CM | POA: Diagnosis not present

## 2019-10-08 DIAGNOSIS — N951 Menopausal and female climacteric states: Secondary | ICD-10-CM | POA: Diagnosis not present

## 2019-10-09 ENCOUNTER — Ambulatory Visit (INDEPENDENT_AMBULATORY_CARE_PROVIDER_SITE_OTHER): Payer: Medicare Other | Admitting: Family Medicine

## 2019-10-09 ENCOUNTER — Encounter: Payer: Self-pay | Admitting: Family Medicine

## 2019-10-09 VITALS — BP 126/80 | HR 73 | Temp 96.1°F | Resp 12 | Ht 61.0 in | Wt 166.0 lb

## 2019-10-09 DIAGNOSIS — E041 Nontoxic single thyroid nodule: Secondary | ICD-10-CM | POA: Diagnosis not present

## 2019-10-09 DIAGNOSIS — Z6831 Body mass index (BMI) 31.0-31.9, adult: Secondary | ICD-10-CM

## 2019-10-09 DIAGNOSIS — E6609 Other obesity due to excess calories: Secondary | ICD-10-CM

## 2019-10-09 DIAGNOSIS — I728 Aneurysm of other specified arteries: Secondary | ICD-10-CM

## 2019-10-09 DIAGNOSIS — I10 Essential (primary) hypertension: Secondary | ICD-10-CM | POA: Diagnosis not present

## 2019-10-09 DIAGNOSIS — Z0000 Encounter for general adult medical examination without abnormal findings: Secondary | ICD-10-CM

## 2019-10-09 DIAGNOSIS — Z780 Asymptomatic menopausal state: Secondary | ICD-10-CM

## 2019-10-09 DIAGNOSIS — E7800 Pure hypercholesterolemia, unspecified: Secondary | ICD-10-CM

## 2019-10-09 LAB — LIPID PANEL
Cholesterol: 186 mg/dL (ref 0–200)
HDL: 50.6 mg/dL (ref >39.00)
LDL Cholesterol: 101 mg/dL — ABNORMAL HIGH (ref 0–99)
NonHDL: 135.1
Total CHOL/HDL Ratio: 4
Triglycerides: 173 mg/dL — ABNORMAL HIGH (ref 0.0–149.0)
VLDL: 34.6 mg/dL (ref 0.0–40.0)

## 2019-10-09 LAB — BASIC METABOLIC PANEL
BUN: 14 mg/dL (ref 6–23)
CO2: 27 mEq/L (ref 19–32)
Calcium: 9.2 mg/dL (ref 8.4–10.5)
Chloride: 109 mEq/L (ref 96–112)
Creatinine, Ser: 0.62 mg/dL (ref 0.40–1.20)
GFR: 95.76 mL/min (ref >60.00)
Glucose, Bld: 89 mg/dL (ref 70–99)
Potassium: 3.8 mEq/L (ref 3.5–5.1)
Sodium: 142 mEq/L (ref 135–145)

## 2019-10-09 LAB — TSH: TSH: 0.67 u[IU]/mL (ref 0.35–4.50)

## 2019-10-09 NOTE — Patient Instructions (Addendum)
  A few tips:  Ms. Crossley , Thank you for taking time to come for your Medicare Wellness Visit. I appreciate your ongoing commitment to your health goals. Please review the following plan we discussed and let me know if I can assist you in the future.   These are the goals we discussed: Goals   None     This is a list of the screening recommended for you and due dates:  Health Maintenance  Topic Date Due  . Mammogram  03/09/2021  . Colon Cancer Screening  10/30/2023  . Flu Shot  Completed  . DEXA scan (bone density measurement)  Completed  .  Hepatitis C: One time screening is recommended by Center for Disease Control  (CDC) for  adults born from [birth year] through 1964.   Completed  . Pneumonia vaccines  Completed  . Tetanus Vaccine  Discontinued    -As we age balance is not as good as it was, so there is a higher risks for falls. Please remove small rugs and furniture that is "in your way" and could increase the risk of falls. Stretching exercises may help with fall prevention: Yoga and Tai Chi are some examples. Low impact exercise is better, so you are not very achy the next day.  -Sun screen and avoidance of direct sun light recommended. Caution with dehydration, if working outdoors be sure to drink enough fluids.  - Some medications are not safe as we age, increases the risk of side effects and can potentially interact with other medication you are also taken;  including some of over the counter medications. Be sure to let me know when you start a new medication even if it is a dietary/vitamin supplement.   -Healthy diet low in red meet/animal fat and sugar + regular physical activity is recommended.     Screening schedule for the next 5-10 years:  Colonoscopy in 2025  Glaucoma screening/eye exam every 1-2 years.  Mammogram for breast cancer screening annually.  Flu vaccine annually.  Fall prevention

## 2019-10-09 NOTE — Progress Notes (Signed)
HPI:   Cheryl Barnes is a 69 y.o. female, who is here today for AWV and chronic disease management.  She was last seen on 05/08/19. No new problem since her last visit.  Lower back pain has improved. Still having left hip pain. Started taking antiinflammatory teat and it is gradually getting better.  1-2 per week for 3 months. She is trying to follow a healthful diet,, not as well as she did before due to COVID 19 pandemia.  She has gained some wt, so yesterday she started care at [facility] loss clinic. F/U in a week when she is having blood work.  She lives alone. Independent ADL's and IADL's. No falls in the past year and denies depression symptoms.  Functional Status Survey: Is the patient deaf or have difficulty hearing?: No Does the patient have difficulty seeing, even when wearing glasses/contacts?: No Does the patient have difficulty concentrating, remembering, or making decisions?: No Does the patient have difficulty walking or climbing stairs?: No Does the patient have difficulty dressing or bathing?: No Does the patient have difficulty doing errands alone such as visiting a doctor's office or shopping?: No  Fall Risk  10/09/2019 10/04/2018 09/24/2017  Falls in the past year? 0 0 No  Number falls in past yr: 0 0 -  Injury with Fall? 0 0 -  Follow up Education provided Education provided -   Providers she sees regularly: Eye care provider: [facility], Dr Wynetta Emery. Ortho: Dr Theda Sers prn. Dermatology, Karma Lew.Hx of AK and seborrheic keratosis.  Depression screen [facility] 2/9 10/09/2019  Decreased Interest 0  Down, Depressed, Hopeless 0  PHQ - 2 Score 0    Mini-Cog - 10/09/19 0955    Normal clock drawing test?  yes       Hearing Screening   125Hz  250Hz  500Hz  1000Hz  2000Hz  3000Hz  4000Hz  6000Hz  8000Hz   Right ear:           Left ear:           Vision Screening Comments: Refused.   HTN: Currently she is on Amlodipine 5 mg daily and Losartan 25 mg  daily. Home BP's: < 120/70. Negative for severe/frequent headache, visual changes, chest pain, dyspnea, palpitation, claudication, focal weakness, or edema.  Hx of LBBB, she follows with cardiologist prn.  Component     Latest Ref Rng & Units 08/12/2019  Sodium     135 - 145 mEq/L 138  Potassium     3.5 - 5.1 mEq/L 4.2  Chloride     96 - 112 mEq/L 103  CO2     19 - 32 mEq/L 29  Glucose     70 - 99 mg/dL 98  BUN     6 - 23 mg/dL 14  Creatinine     0.40 - 1.20 mg/dL 0.75  Calcium     8.4 - 10.5 mg/dL 9.3  GFR     >60.00 mL/min 76.91   HLD: She is on non pharmacologic treatment. Component     Latest Ref Rng & Units 10/01/2018  Cholesterol     0 - 200 mg/dL 179  Triglycerides     0.0 - 149.0 mg/dL 129.0  HDL Cholesterol     >39.00 mg/dL 54.60  VLDL     0.0 - 40.0 mg/dL 25.8  LDL (calc)     0 - 99 mg/dL 99  Total CHOL/HDL Ratio      3  NonHDL      124.61  Lab Results  Component Value Date   WBC 6.2 08/12/2019   HGB 13.7 08/12/2019   HCT 41.2 08/12/2019   MCV 91.5 08/12/2019   PLT 168.0 08/12/2019    Review of Systems  Constitutional: Negative for activity change, appetite change, fatigue and fever.  HENT: Negative for mouth sores, nosebleeds and sore throat.   Eyes: Negative for redness and visual disturbance.  Respiratory: Negative for cough and wheezing.   Gastrointestinal: Negative for abdominal pain, nausea and vomiting.       Negative for changes in bowel habits.  Genitourinary: Negative for decreased urine volume and hematuria.  Musculoskeletal: Positive for arthralgias. Negative for gait problem and myalgias.  Neurological: Negative for seizures, syncope, weakness, numbness and headaches.  Psychiatric/Behavioral: Negative for confusion. The patient is not nervous/anxious.   Rest of ROS, see pertinent positives sand negatives in HPI   Current Outpatient Medications on File Prior to Visit  Medication Sig Dispense Refill  . amLODipine (NORVASC) 5 MG  tablet Take 1 tablet (5 mg total) by mouth daily. 90 tablet 2  . Calcium Carb-Cholecalciferol 600-800 MG-UNIT TABS Take by mouth.    . Calcium Citrate-Vitamin D (CALCIUM + D PO) Take 600 mg by mouth 2 (two) times daily.    . cetirizine (ZYRTEC) 10 MG tablet Take 10 mg by mouth daily.    Marland Kitchen docusate sodium (COLACE) 100 MG capsule Take 1 capsule (100 mg total) by mouth 2 (two) times daily. 30 capsule 0  . losartan (COZAAR) 25 MG tablet Take 1 tablet (25 mg total) by mouth daily. 90 tablet 3  . Multiple Vitamin (MULTIVITAMIN) capsule Take 1 capsule by mouth daily.    . valACYclovir (VALTREX) 1000 MG tablet Take 2 tablets by mouth as needed upon acute onset and asap repeat dose in 12 hours 16 tablet 5   No current facility-administered medications on file prior to visit.     Past Medical History:  Diagnosis Date  . Allergy   . Diverticulitis   . Hypertension   . LBBB (left bundle branch block)   . Migraine    history   Allergies  Allergen Reactions  . Penicillins Itching and Rash    Has patient had a PCN reaction causing immediate rash, facial/tongue/throat swelling, SOB or lightheadedness with hypotension: Yes Has patient had a PCN reaction causing severe rash involving mucus membranes or skin necrosis: No Has patient had a PCN reaction that required hospitalization No Has patient had a PCN reaction occurring within the last 10 years: No If all of the above answers are "NO", then may proceed with Cephalosporin use.   . Seasonal Ic [Cholestatin] Other (See Comments)    Upper resp, sore throat, sneezing, etc.  . Tape Other (See Comments)    Tape causes some redness & causes BLISTERS if left on for too long  . Latex Itching and Rash    Social History   Socioeconomic History  . Marital status: Single    Spouse name: Not on file  . Number of children: Not on file  . Years of education: Not on file  . Highest education level: Not on file  Occupational History  . Occupation:  ophthalmology @ [employer]    Employer: [employer] Use  . Smoking status: Never Smoker  . Smokeless tobacco: Never Used  Substance and Sexual Activity  . Alcohol use: No  . Drug use: No  . Sexual activity: Never  Other Topics Concern  . Not on file  Social  History Narrative  . Not on file   Social Determinants of Health   Financial Resource Strain:   . Difficulty of Paying Living Expenses: Not on file  Food Insecurity:   . Worried About Charity fundraiser in the Last Year: Not on file  . Ran Out of Food in the Last Year: Not on file  Transportation Needs:   . Lack of Transportation (Medical): Not on file  . Lack of Transportation (Non-Medical): Not on file  Physical Activity:   . Days of Exercise per Week: Not on file  . Minutes of Exercise per Session: Not on file  Stress:   . Feeling of Stress : Not on file  Social Connections:   . Frequency of Communication with Friends and Family: Not on file  . Frequency of Social Gatherings with Friends and Family: Not on file  . Attends Religious Services: Not on file  . Active Member of Clubs or Organizations: Not on file  . Attends Archivist Meetings: Not on file  . Marital Status: Not on file    Vitals:   10/09/19 0924  BP: 126/80  Pulse: 73  Resp: 12  Temp: (!) 96.1 F (35.6 C)  SpO2: 97%   Body mass index is 31.37 kg/m.   Physical Exam  Nursing note and vitals reviewed. Constitutional: She is oriented to person, place, and time. She appears well-developed. No distress.  HENT:  Head: Normocephalic and atraumatic.  Mouth/Throat: Oropharynx is clear and moist and mucous membranes are normal.  Eyes: Pupils are equal, round, and reactive to light. Conjunctivae are normal.  Neck: Thyromegaly present.  Cardiovascular: Normal rate and regular rhythm.  No murmur heard. Pulses:      Dorsalis pedis pulses are 2+ on the right side and 2+ on the left side.  Respiratory: Effort normal and breath sounds  normal. No respiratory distress.  GI: Soft. She exhibits no mass. There is no hepatomegaly. There is no abdominal tenderness.  Musculoskeletal:        General: No edema.  Lymphadenopathy:    She has no cervical adenopathy.  Neurological: She is alert and oriented to person, place, and time. She has normal strength. No cranial nerve deficit. Gait normal.  Skin: Skin is warm. No rash noted. No erythema.  Psychiatric: She has a normal mood and affect.  Well groomed, good eye contact.   ASSESSMENT AND PLAN:   Ms. Shaun Rubel was seen today for AWV and chronic disease management.   Orders Placed This Encounter  Procedures  . DG Bone Density  . Basic metabolic panel  . TSH  . Lipid panel   Lab Results  Component Value Date   TSH 0.67 10/09/2019   Lab Results  Component Value Date   CHOL 186 10/09/2019   HDL 50.60 10/09/2019   LDLCALC 101 (H) 10/09/2019   LDLDIRECT 120.6 08/22/2011   TRIG 173.0 (H) 10/09/2019   CHOLHDL 4 10/09/2019   Lab Results  Component Value Date   CREATININE 0.62 10/09/2019   BUN 14 10/09/2019   NA 142 10/09/2019   K 3.8 10/09/2019   CL 109 10/09/2019   CO2 27 10/09/2019    1. Medicare annual wellness visit, subsequent We discussed the importance of staying active, physically and mentally, as well as the benefits of a healthy/balance diet. Low impact exercise that involve stretching and strengthing are ideal. Vaccines up to date. We discussed preventive screening for the next 5-10 years, summery of recommendations given in  AVS.   Health Maintenance  Topic Date Due  . Mammogram  03/09/2021  . Colon Cancer Screening  10/30/2023  . Flu Shot  Completed  . DEXA scan (bone density measurement)  Completed  .  Hepatitis C: One time screening is recommended by Center for Disease Control  (CDC) for  adults born from [birth year] through 1964.   Completed  . Pneumonia vaccines  Completed  . Tetanus Vaccine  Discontinued   Fall prevention.  Advance  directives and end of life discussed, she has POA and living will.   The 10-year ASCVD risk score Mikey Bussing DC Brooke Bonito., et al., 2013) is: 8.9%   Values used to calculate the score:     Age: 70 years     Sex: Female     Is Non-Hispanic African American: No     Diabetic: No     Tobacco smoker: No     Systolic Blood Pressure: 123XX123 mmHg     Is BP treated: Yes     HDL Cholesterol: 50.6 mg/dL     Total Cholesterol: 186 mg/dL  2. Essential hypertension BP adequately controlled. Continue Amlodipine 5 mg daily and Losartan 25 mg daily. Eye exam is current. Continue low salt diet and checking BP regularly.  - Basic metabolic panel  3. Pure hypercholesterolemia Continue low fat diet. Further recommendations according to FLP results.  - Lipid panel  4. Thyroid nodule Asymptomatic. Further recommendations according to lab result.  - TSH  5. Asymptomatic postmenopausal estrogen deficiency - DG Bone Density; Future  6. Splenic artery aneurysm ([place]) Last abdominal CT 05/22/2017: 1. Acute uncomplicated diverticulitis of the distal descending colon. No perforation or abscess. 2. Unchanged 1.5 cm splenic artery aneurysm.  7. Class 1 obesity due to excess calories with serious comorbidity and body mass index (BMI) of 31.0 to 31.9 in adult We discussed benefits of wt loss as well as adverse effects of obesity. Consistency with healthy diet and physical activity recommended. Following with Weight loss Clinic.   Return in about 6 months (around 04/07/2020) for HTN.   Sareena Odeh G. [name], MD  [facility]. [place] office.

## 2019-10-11 ENCOUNTER — Encounter: Payer: Self-pay | Admitting: Family Medicine

## 2019-10-11 DIAGNOSIS — Z23 Encounter for immunization: Secondary | ICD-10-CM | POA: Diagnosis not present

## 2019-10-13 ENCOUNTER — Other Ambulatory Visit: Payer: Self-pay | Admitting: Physician Assistant

## 2019-10-13 DIAGNOSIS — L432 Lichenoid drug reaction: Secondary | ICD-10-CM | POA: Diagnosis not present

## 2019-10-13 DIAGNOSIS — D229 Melanocytic nevi, unspecified: Secondary | ICD-10-CM | POA: Diagnosis not present

## 2019-10-13 DIAGNOSIS — D485 Neoplasm of uncertain behavior of skin: Secondary | ICD-10-CM | POA: Diagnosis not present

## 2019-10-13 DIAGNOSIS — D225 Melanocytic nevi of trunk: Secondary | ICD-10-CM | POA: Diagnosis not present

## 2019-10-27 DIAGNOSIS — N951 Menopausal and female climacteric states: Secondary | ICD-10-CM | POA: Diagnosis not present

## 2019-10-27 DIAGNOSIS — E7800 Pure hypercholesterolemia, unspecified: Secondary | ICD-10-CM | POA: Diagnosis not present

## 2019-10-27 DIAGNOSIS — Z6830 Body mass index (BMI) 30.0-30.9, adult: Secondary | ICD-10-CM | POA: Diagnosis not present

## 2019-10-27 DIAGNOSIS — R7303 Prediabetes: Secondary | ICD-10-CM | POA: Diagnosis not present

## 2019-11-03 ENCOUNTER — Ambulatory Visit (INDEPENDENT_AMBULATORY_CARE_PROVIDER_SITE_OTHER)
Admission: RE | Admit: 2019-11-03 | Discharge: 2019-11-03 | Disposition: A | Discharge status: Home / Self Care | Payer: Medicare Other | Source: Ambulatory Visit | Attending: Family Medicine | Admitting: Family Medicine

## 2019-11-03 ENCOUNTER — Other Ambulatory Visit: Payer: Self-pay

## 2019-11-03 DIAGNOSIS — Z780 Asymptomatic menopausal state: Secondary | ICD-10-CM | POA: Diagnosis not present

## 2019-11-04 ENCOUNTER — Other Ambulatory Visit: Payer: Medicare Other

## 2019-11-04 DIAGNOSIS — Z6830 Body mass index (BMI) 30.0-30.9, adult: Secondary | ICD-10-CM | POA: Diagnosis not present

## 2019-11-04 DIAGNOSIS — I10 Essential (primary) hypertension: Secondary | ICD-10-CM | POA: Diagnosis not present

## 2019-11-07 DIAGNOSIS — Z23 Encounter for immunization: Secondary | ICD-10-CM | POA: Diagnosis not present

## 2019-11-10 ENCOUNTER — Encounter: Payer: Self-pay | Admitting: Family Medicine

## 2019-11-11 DIAGNOSIS — Z6829 Body mass index (BMI) 29.0-29.9, adult: Secondary | ICD-10-CM | POA: Diagnosis not present

## 2019-11-11 DIAGNOSIS — M2550 Pain in unspecified joint: Secondary | ICD-10-CM | POA: Diagnosis not present

## 2019-11-18 DIAGNOSIS — Z6829 Body mass index (BMI) 29.0-29.9, adult: Secondary | ICD-10-CM | POA: Diagnosis not present

## 2019-11-18 DIAGNOSIS — R7303 Prediabetes: Secondary | ICD-10-CM | POA: Diagnosis not present

## 2019-11-25 DIAGNOSIS — R7303 Prediabetes: Secondary | ICD-10-CM | POA: Diagnosis not present

## 2019-11-25 DIAGNOSIS — Z6829 Body mass index (BMI) 29.0-29.9, adult: Secondary | ICD-10-CM | POA: Diagnosis not present

## 2019-11-25 DIAGNOSIS — E7800 Pure hypercholesterolemia, unspecified: Secondary | ICD-10-CM | POA: Diagnosis not present

## 2019-12-09 DIAGNOSIS — E7800 Pure hypercholesterolemia, unspecified: Secondary | ICD-10-CM | POA: Diagnosis not present

## 2019-12-09 DIAGNOSIS — Z6829 Body mass index (BMI) 29.0-29.9, adult: Secondary | ICD-10-CM | POA: Diagnosis not present

## 2019-12-11 ENCOUNTER — Other Ambulatory Visit: Payer: Self-pay | Admitting: Family Medicine

## 2019-12-23 DIAGNOSIS — Z6829 Body mass index (BMI) 29.0-29.9, adult: Secondary | ICD-10-CM | POA: Diagnosis not present

## 2019-12-23 DIAGNOSIS — R7303 Prediabetes: Secondary | ICD-10-CM | POA: Diagnosis not present

## 2019-12-23 DIAGNOSIS — I10 Essential (primary) hypertension: Secondary | ICD-10-CM | POA: Diagnosis not present

## 2020-01-06 DIAGNOSIS — M2550 Pain in unspecified joint: Secondary | ICD-10-CM | POA: Diagnosis not present

## 2020-01-06 DIAGNOSIS — Z6828 Body mass index (BMI) 28.0-28.9, adult: Secondary | ICD-10-CM | POA: Diagnosis not present

## 2020-01-13 DIAGNOSIS — Z6829 Body mass index (BMI) 29.0-29.9, adult: Secondary | ICD-10-CM | POA: Diagnosis not present

## 2020-01-13 DIAGNOSIS — I10 Essential (primary) hypertension: Secondary | ICD-10-CM | POA: Diagnosis not present

## 2020-01-20 DIAGNOSIS — E7800 Pure hypercholesterolemia, unspecified: Secondary | ICD-10-CM | POA: Diagnosis not present

## 2020-01-20 DIAGNOSIS — R7303 Prediabetes: Secondary | ICD-10-CM | POA: Diagnosis not present

## 2020-01-20 DIAGNOSIS — Z6829 Body mass index (BMI) 29.0-29.9, adult: Secondary | ICD-10-CM | POA: Diagnosis not present

## 2020-02-03 DIAGNOSIS — Z6828 Body mass index (BMI) 28.0-28.9, adult: Secondary | ICD-10-CM | POA: Diagnosis not present

## 2020-02-03 DIAGNOSIS — R7303 Prediabetes: Secondary | ICD-10-CM | POA: Diagnosis not present

## 2020-02-17 ENCOUNTER — Other Ambulatory Visit: Payer: Self-pay | Admitting: Family Medicine

## 2020-02-17 DIAGNOSIS — Z1231 Encounter for screening mammogram for malignant neoplasm of breast: Secondary | ICD-10-CM

## 2020-03-08 ENCOUNTER — Other Ambulatory Visit: Payer: Self-pay | Admitting: Family Medicine

## 2020-03-10 ENCOUNTER — Ambulatory Visit
Admission: RE | Admit: 2020-03-10 | Discharge: 2020-03-10 | Disposition: A | Discharge status: Home / Self Care | Payer: Medicare Other | Source: Ambulatory Visit | Attending: Family Medicine | Admitting: Family Medicine

## 2020-03-10 ENCOUNTER — Other Ambulatory Visit: Payer: Self-pay

## 2020-03-10 DIAGNOSIS — Z1231 Encounter for screening mammogram for malignant neoplasm of breast: Secondary | ICD-10-CM | POA: Diagnosis not present

## 2020-03-17 DIAGNOSIS — M25552 Pain in left hip: Secondary | ICD-10-CM | POA: Diagnosis not present

## 2020-06-22 DIAGNOSIS — Z23 Encounter for immunization: Secondary | ICD-10-CM | POA: Diagnosis not present

## 2020-07-06 ENCOUNTER — Telehealth: Payer: Medicare Other | Admitting: Family Medicine

## 2020-07-06 DIAGNOSIS — R509 Fever, unspecified: Secondary | ICD-10-CM | POA: Diagnosis not present

## 2020-07-06 DIAGNOSIS — J029 Acute pharyngitis, unspecified: Secondary | ICD-10-CM | POA: Diagnosis not present

## 2020-07-06 DIAGNOSIS — R52 Pain, unspecified: Secondary | ICD-10-CM | POA: Diagnosis not present

## 2020-07-15 ENCOUNTER — Other Ambulatory Visit: Payer: Self-pay | Admitting: Family Medicine

## 2020-07-19 DIAGNOSIS — H1045 Other chronic allergic conjunctivitis: Secondary | ICD-10-CM | POA: Diagnosis not present

## 2020-08-15 DIAGNOSIS — M7731 Calcaneal spur, right foot: Secondary | ICD-10-CM | POA: Diagnosis not present

## 2020-08-15 DIAGNOSIS — M722 Plantar fascial fibromatosis: Secondary | ICD-10-CM | POA: Diagnosis not present

## 2020-08-23 DIAGNOSIS — M722 Plantar fascial fibromatosis: Secondary | ICD-10-CM | POA: Diagnosis not present

## 2020-08-27 HISTORY — PX: TYMPANOSTOMY TUBE PLACEMENT: SHX32

## 2020-08-31 DIAGNOSIS — M71571 Other bursitis, not elsewhere classified, right ankle and foot: Secondary | ICD-10-CM | POA: Diagnosis not present

## 2020-08-31 DIAGNOSIS — M722 Plantar fascial fibromatosis: Secondary | ICD-10-CM | POA: Diagnosis not present

## 2020-09-07 DIAGNOSIS — M71571 Other bursitis, not elsewhere classified, right ankle and foot: Secondary | ICD-10-CM | POA: Diagnosis not present

## 2020-09-07 DIAGNOSIS — M722 Plantar fascial fibromatosis: Secondary | ICD-10-CM | POA: Diagnosis not present

## 2020-10-07 ENCOUNTER — Telehealth: Payer: Self-pay | Admitting: Family Medicine

## 2020-10-07 MED ORDER — LOSARTAN POTASSIUM 25 MG PO TABS
ORAL_TABLET | ORAL | 3 refills | Status: DC
Start: 2020-10-07 — End: 2020-11-18

## 2020-10-07 MED ORDER — AMLODIPINE BESYLATE 5 MG PO TABS: ORAL_TABLET | ORAL | 2 refills | Status: DC

## 2020-10-07 NOTE — Telephone Encounter (Signed)
amLODipine (NORVASC) 5 MG tablet    losartan (COZAAR) 25 MG tablet  [place] Phone:  [phone]  Fax:  [phone]       Patient needs this medication refilled ASAP because she only has 5 days left of her medication.

## 2020-10-07 NOTE — Telephone Encounter (Signed)
Rx's sent in. °

## 2020-10-16 NOTE — Progress Notes (Signed)
HPI: Cheryl Barnes is a 70 y.o. female, who is here today for AWV and 12 months follow up.   She was last seen on 10/09/19.  She lives alone. Independent ADL's and IADL's.  Functional Status Survey: Is the patient deaf or have difficulty hearing?: No Does the patient have difficulty seeing, even when wearing glasses/contacts?: No Does the patient have difficulty concentrating, remembering, or making decisions?: No Does the patient have difficulty walking or climbing stairs?: No Does the patient have difficulty dressing or bathing?: No Does the patient have difficulty doing errands alone such as visiting a doctor's office or shopping?: No  Fall Risk  10/17/2020 10/09/2019 10/04/2018 09/24/2017  Falls in the past year? 0 0 0 No  Number falls in past yr: 0 0 0 -  Injury with Fall? 0 0 0 -  Follow up Education provided Education provided Education provided -   Providers she sees regularly: Eye care provider: Dr Wynetta Emery Dermatology: Ms Montgomery, [place] [facility]: Dr Theda Sers, prn.  Depression screen [facility] 2/9 10/17/2020  Decreased Interest 0  Down, Depressed, Hopeless 0  PHQ - 2 Score 0     Mini-Cog - 10/17/20 0917    Normal clock drawing test? yes    How many words correct? 3            Hearing Screening   _0  _1  _2  _3  _4  _5  _6  _7  _8   Right ear:           Left ear:             Visual Acuity Screening   Right eye Left eye Both eyes  Without correction: _9  With correction:      HTN: She is on Losartan 25 mg daily and Amlodipine 5 mg daily. She checks BP at home, sometimes "a little high" since foot pain started: 140/92,150/101 x 1. Negative for severe/frequent headache, visual changes, dyspnea, palpitation, claudication, focal weakness, or edema. Hx of LBBB.  Lab Results  Component Value Date   CREATININE 0.62 10/09/2019   BUN 14 10/09/2019   NA 142 10/09/2019   K 3.8 10/09/2019   CL 109 10/09/2019   CO2 27  10/09/2019   HLD:  She is on non pharmacologic treatment.  Lab Results  Component Value Date   CHOL 186 10/09/2019   HDL 50.60 10/09/2019   LDLCALC 101 (H) 10/09/2019   LDLDIRECT 120.6 08/22/2011   TRIG 173.0 (H) 10/09/2019   CHOLHDL 4 10/09/2019   Since her last visit she was evaluated by podiatrist for plantar fascitis and fracture with no hx of trauma. She is on Meloxicam 7.5 mg daily. Pain started back a few days ago, she started exercises she did initially and these have helped.  She is not exercising regularly. She cooks at home if possible, sat and sun for the rest of the week. Eats out 1-2 times per month.  6-8 weeks ago she noted left-sided chest tightness, can happen at any time, < 1 min.Can happened 0-3 times per day. Usually not radiated but one time pain was radiating to neck,bilateral. Last time she had pain was yesterday while she was watching TV, "relaxing."  No associated symptoms.  Echo in 11/2014 LVEF 50-55%., grade I diastolic dysfunction. She called her cardiologists office and she was told she needed a referral.  Splenic artery aneurysm seen incidentally on abdominal CT 01/24/17: Stable 1.5 cm splenic artery aneurysm (compared with imaging on 05/13/16). No enlarged lymph nodes Also seen  aortic atherosclerosis.  Hx of thyroid nodule: Thyroid US 02/19/17. The gland is markedly heterogeneous. Small nodules an a simple cyst do not meet criteria for follow-up or biopsy. The abnormality on CT in the lower pole of the right lobe posteriorly corresponds to heterogeneous tissue and is felt to represent a pseudo nodule.  Review of Systems  Constitutional: Negative for activity change, appetite change, fatigue and fever.  HENT: Negative for mouth sores, nosebleeds and sore throat.   Respiratory: Negative for cough and wheezing.   Gastrointestinal: Negative for abdominal pain, nausea and vomiting.       Negative for changes in bowel habits.  Endocrine: Negative  for cold intolerance and heat intolerance.  Genitourinary: Negative for decreased urine volume, dysuria and hematuria.  Allergic/Immunologic: Positive for environmental allergies.  Neurological: Negative for syncope, facial asymmetry and weakness.  Rest of ROS, see pertinent positives sand negatives in HPI  Current Outpatient Medications on File Prior to Visit  Medication Sig Dispense Refill  . Calcium Carb-Cholecalciferol 600-800 MG-UNIT TABS Take by mouth.    . Calcium Citrate-Vitamin D (CALCIUM + D PO) Take 600 mg by mouth 2 (two) times daily.    . cetirizine (ZYRTEC) 10 MG tablet Take 10 mg by mouth daily.    Marland Kitchen docusate sodium (COLACE) 100 MG capsule Take 1 capsule (100 mg total) by mouth 2 (two) times daily. 30 capsule 0  . losartan (COZAAR) 25 MG tablet TAKE 1 TABLET(25 MG) BY MOUTH DAILY 90 tablet 3  . meloxicam (MOBIC) 7.5 MG tablet Take 7.5 mg by mouth daily.    . Multiple Vitamin (MULTIVITAMIN) capsule Take 1 capsule by mouth daily.    . valACYclovir (VALTREX) 1000 MG tablet Take 2 tablets by mouth as needed upon acute onset and asap repeat dose in 12 hours 16 tablet 5   No current facility-administered medications on file prior to visit.   Past Medical History:  Diagnosis Date  . Allergy   . Diverticulitis   . Hypertension   . LBBB (left bundle branch block)   . Migraine    history   Allergies  Allergen Reactions  . Penicillins Itching and Rash    Has patient had a PCN reaction causing immediate rash, facial/tongue/throat swelling, SOB or lightheadedness with hypotension: Yes Has patient had a PCN reaction causing severe rash involving mucus membranes or skin necrosis: No Has patient had a PCN reaction that required hospitalization No Has patient had a PCN reaction occurring within the last 10 years: No If all of the above answers are "NO", then may proceed with Cephalosporin use.   . Seasonal Ic [Cholestatin] Other (See Comments)    Upper resp, sore throat, sneezing,  etc.  . Tape Other (See Comments)    Tape causes some redness & causes BLISTERS if left on for too long  . Latex Itching and Rash    Social History   Socioeconomic History  . Marital status: Single    Spouse name: Not on file  . Number of children: Not on file  . Years of education: Not on file  . Highest education level: Not on file  Occupational History  . Occupation: ophthalmology @ [employer]    Employer: [employer] Use  . Smoking status: Never Smoker  . Smokeless tobacco: Never Used  Vaping Use  . Vaping Use: Never used  Substance and Sexual Activity  . Alcohol use: No  . Drug use: No  . Sexual activity: Never  Other Topics Concern  .  Not on file  Social History Narrative  . Not on file   Social Determinants of Health   Financial Resource Strain: Not on file  Food Insecurity: Not on file  Transportation Needs: Not on file  Physical Activity: Not on file  Stress: Not on file  Social Connections: Not on file   Vitals:   10/17/20 0900  BP: 128/80  Pulse: 67  Resp: 16  Temp: 97.7 F (36.5 C)  SpO2: 98%   Body mass index is 30.7 kg/m.  Physical Exam Vitals and nursing note reviewed.  Constitutional:      General: She is not in acute distress.    Appearance: She is well-developed.  HENT:     Head: Normocephalic and atraumatic.     Mouth/Throat:     Mouth: Oropharynx is clear and moist and mucous membranes are normal. Mucous membranes are moist.     Pharynx: Oropharynx is clear.  Eyes:     Conjunctiva/sclera: Conjunctivae normal.  Neck:     Thyroid: Thyromegaly present.  Cardiovascular:     Rate and Rhythm: Normal rate and regular rhythm.     Pulses:          Dorsalis pedis pulses are 2+ on the right side and 2+ on the left side.     Heart sounds: No murmur heard.   Pulmonary:     Effort: Pulmonary effort is normal. No respiratory distress.     Breath sounds: Normal breath sounds.  Abdominal:     Palpations: Abdomen is soft. There is  no hepatomegaly or mass.     Tenderness: There is no abdominal tenderness.  Musculoskeletal:        General: No edema.  Lymphadenopathy:     Cervical: No cervical adenopathy.  Skin:    General: Skin is warm.     Findings: No erythema or rash.  Neurological:     Mental Status: She is alert and oriented to person, place, and time.     Cranial Nerves: No cranial nerve deficit.     Gait: Gait normal.     Deep Tendon Reflexes: Strength normal.  Psychiatric:        Mood and Affect: Mood and affect normal.     Comments: Well groomed, good eye contact.    ASSESSMENT AND PLAN:  Cheryl Barnes was seen today for AWV and 12 months follow-up.  Orders Placed This Encounter  Procedures  . Comprehensive metabolic panel  . Lipid panel  . TSH  . Ambulatory referral to Cardiology  . EKG 12-Lead    Lab Results  Component Value Date   CHOL 178 10/17/2020   HDL 53.50 10/17/2020   LDLCALC 96 10/17/2020   LDLDIRECT 120.6 08/22/2011   TRIG 140.0 10/17/2020   CHOLHDL 3 10/17/2020   Lab Results  Component Value Date   CREATININE 0.73 10/17/2020   BUN 14 10/17/2020   NA 142 10/17/2020   K 4.2 10/17/2020   CL 106 10/17/2020   CO2 27 10/17/2020   Lab Results  Component Value Date   ALT 25 10/17/2020   AST 21 10/17/2020   ALKPHOS 92 10/17/2020   BILITOT 0.6 10/17/2020    Medicare annual wellness visit, subsequent We discussed the importance of staying active, physically and mentally, as well as the benefits of a healthy/balance diet. Low impact exercise that involve stretching and strengthing are ideal. Vaccines up to date. We discussed preventive screening for the next 5-10 years, summery of recommendations given in AVS.  Fall prevention. Mammogram: 03/10/20. Colonoscopy on 10/29/13, 10 years f/u recommended. DEXA: 11/03/19: Osteopenia, FRAX score 11% and 1.9% for major osteoporotic and hip fracture respectively. Advance directives and end of life discussed, she has POA and  living will.   Essential hypertension BP not adequately controlled,  re-checked  Today:160/90. Amlodipine increased from 5 mg to 10 mg daily. Continue Losartan 25 mg daily. Continue monitoring BP regularly and low salt diet.  -     amLODipine (NORVASC) 10 MG tablet; Take 1 tablet (10 mg total) by mouth daily.  Pure hypercholesterolemia Continue non pharmacologic treatment. Further recommendations according to FLP result.  The 10-year ASCVD risk score Mikey Bussing DC Brooke Bonito., et al., 2013) is: 9.9%   Values used to calculate the score:     Age: 70 years     Sex: Female     Is Non-Hispanic African American: No     Diabetic: No     Tobacco smoker: No     Systolic Blood Pressure: 474 mmHg     Is BP treated: Yes     HDL Cholesterol: 53.5 mg/dL     Total Cholesterol: 178 mg/dL  Chest pain, unspecified type We discussed possible etiologies. EKG today: SR,normal axis. T wave abnormalities on anterolateral leads. LBBB not longer present compared with EKG in 01/2017. Instructed about warning signs. Cardio referral placed.  Thyroid nodule Stable. Further recommendations according to TSH result.  Splenic artery aneurysm ([place]) Incidental finding. Asymptomatic.  Atherosclerosis of aorta ([place]) She is not on statin or Aspirin. Recommendations will be made according to FLP result.  Return in about 4 months (around 02/14/2021) for HTN.   Jarmar Rousseau G. [name], MD  [facility]. [place] office.  Cheryl Barnes , Thank you for taking time to come for your Medicare Wellness Visit. I appreciate your ongoing commitment to your health goals. Please review the following plan we discussed and let me know if I can assist you in the future.   These are the goals we discussed: Goals    . Exercise 3x per week (30 min per time)     150 min per week. Low impact exercise.       This is a list of the screening recommended for you and due dates:  Health Maintenance  Topic Date Due  . COVID-19  Vaccine (1) Never done  . Flu Shot  11/24/2020*  . Mammogram  03/10/2022  . Colon Cancer Screening  10/30/2023  . DEXA scan (bone density measurement)  Completed  .  Hepatitis C: One time screening is recommended by Center for Disease Control  (CDC) for  adults born from [birth year] through 1964.   Completed  . Pneumonia vaccines  Completed  . Tetanus Vaccine  Discontinued  *Topic was postponed. The date shown is not the original due date.   A few things to remember from today's visit:  Essential hypertension - Plan: amLODipine (NORVASC) 10 MG tablet, Comprehensive metabolic panel  Pure hypercholesterolemia - Plan: Comprehensive metabolic panel, Lipid panel  Chest pain, unspecified type - Plan: Ambulatory referral to Cardiology, EKG 12-Lead  Thyroid nodule - Plan: TSH  If you need refills please call your pharmacy. Do not use My Chart to request refills or for acute issues that need immediate attention.   Please be sure medication list is accurate. If a new problem present, please set up appointment sooner than planned today.  Blood pressure 160/90 here in office. Increase Amlodipine from 5 mg to 10 mg. No changes in Losartan.  Continue monitoring blood pressure at home. Bring monitor next visit.  A few tips:  -As we age balance is not as good as it was, so there is a higher risks for falls. Please remove small rugs and furniture that is "in your way" and could increase the risk of falls. Stretching exercises may help with fall prevention: Yoga and Tai Chi are some examples. Low impact exercise is better, so you are not very achy the next day.  -Sun screen and avoidance of direct sun light recommended. Caution with dehydration, if working outdoors be sure to drink enough fluids.  - Some medications are not safe as we age, increases the risk of side effects and can potentially interact with other medication you are also taken;  including some of over the counter medications. Be sure to  let me know when you start a new medication even if it is a dietary/vitamin supplement.   -Healthy diet low in red meet/animal fat and sugar + regular physical activity is recommended.

## 2020-10-17 ENCOUNTER — Ambulatory Visit (INDEPENDENT_AMBULATORY_CARE_PROVIDER_SITE_OTHER): Payer: Medicare Other | Admitting: Family Medicine

## 2020-10-17 ENCOUNTER — Other Ambulatory Visit: Payer: Self-pay

## 2020-10-17 ENCOUNTER — Encounter: Payer: Self-pay | Admitting: Family Medicine

## 2020-10-17 VITALS — BP 128/80 | HR 67 | Temp 97.7°F | Resp 16 | Ht 61.0 in | Wt 162.5 lb

## 2020-10-17 DIAGNOSIS — R079 Chest pain, unspecified: Secondary | ICD-10-CM | POA: Diagnosis not present

## 2020-10-17 DIAGNOSIS — I700 Atherosclerosis of aorta: Secondary | ICD-10-CM | POA: Diagnosis not present

## 2020-10-17 DIAGNOSIS — I10 Essential (primary) hypertension: Secondary | ICD-10-CM

## 2020-10-17 DIAGNOSIS — E041 Nontoxic single thyroid nodule: Secondary | ICD-10-CM | POA: Diagnosis not present

## 2020-10-17 DIAGNOSIS — E7800 Pure hypercholesterolemia, unspecified: Secondary | ICD-10-CM

## 2020-10-17 DIAGNOSIS — I728 Aneurysm of other specified arteries: Secondary | ICD-10-CM

## 2020-10-17 DIAGNOSIS — Z0000 Encounter for general adult medical examination without abnormal findings: Secondary | ICD-10-CM | POA: Diagnosis not present

## 2020-10-17 LAB — COMPREHENSIVE METABOLIC PANEL
ALT: 25 U/L (ref 0–35)
AST: 21 U/L (ref 0–37)
Albumin: 4.7 g/dL (ref 3.5–5.2)
Alkaline Phosphatase: 92 U/L (ref 39–117)
BUN: 14 mg/dL (ref 6–23)
CO2: 27 mEq/L (ref 19–32)
Calcium: 9.8 mg/dL (ref 8.4–10.5)
Chloride: 106 mEq/L (ref 96–112)
Creatinine, Ser: 0.73 mg/dL (ref 0.40–1.20)
GFR: 84.22 mL/min (ref >60.00)
Glucose, Bld: 98 mg/dL (ref 70–99)
Potassium: 4.2 mEq/L (ref 3.5–5.1)
Sodium: 142 mEq/L (ref 135–145)
Total Bilirubin: 0.6 mg/dL (ref 0.2–1.2)
Total Protein: 7.3 g/dL (ref 6.0–8.3)

## 2020-10-17 LAB — LIPID PANEL
Cholesterol: 178 mg/dL (ref 0–200)
HDL: 53.5 mg/dL (ref >39.00)
LDL Cholesterol: 96 mg/dL (ref 0–99)
NonHDL: 124.32
Total CHOL/HDL Ratio: 3
Triglycerides: 140 mg/dL (ref 0.0–149.0)
VLDL: 28 mg/dL (ref 0.0–40.0)

## 2020-10-17 LAB — TSH: TSH: 0.51 u[IU]/mL (ref 0.35–4.50)

## 2020-10-17 MED ORDER — AMLODIPINE BESYLATE 10 MG PO TABS: 10.0000 mg | ORAL_TABLET | Freq: Every day | ORAL | 1 refills | Status: DC

## 2020-10-17 NOTE — Patient Instructions (Addendum)
  Cheryl Barnes , Thank you for taking time to come for your Medicare Wellness Visit. I appreciate your ongoing commitment to your health goals. Please review the following plan we discussed and let me know if I can assist you in the future.   These are the goals we discussed: Goals    . Exercise 3x per week (30 min per time)     150 min per week. Low impact exercise.       This is a list of the screening recommended for you and due dates:  Health Maintenance  Topic Date Due  . COVID-19 Vaccine (1) Never done  . Flu Shot  11/24/2020*  . Mammogram  03/10/2022  . Colon Cancer Screening  10/30/2023  . DEXA scan (bone density measurement)  Completed  .  Hepatitis C: One time screening is recommended by Center for Disease Control  (CDC) for  adults born from [birth year] through 1964.   Completed  . Pneumonia vaccines  Completed  . Tetanus Vaccine  Discontinued  *Topic was postponed. The date shown is not the original due date.   A few things to remember from today's visit:  Essential hypertension - Plan: amLODipine (NORVASC) 10 MG tablet, Comprehensive metabolic panel  Pure hypercholesterolemia - Plan: Comprehensive metabolic panel, Lipid panel  Chest pain, unspecified type - Plan: Ambulatory referral to Cardiology, EKG 12-Lead  Thyroid nodule - Plan: TSH  If you need refills please call your pharmacy. Do not use My Chart to request refills or for acute issues that need immediate attention.   Please be sure medication list is accurate. If a new problem present, please set up appointment sooner than planned today.  Blood pressure 160/90 here in office. Increase Amlodipine from 5 mg to 10 mg. No changes in Losartan. Continue monitoring blood pressure at home. Bring monitor next visit.  A few tips:  -As we age balance is not as good as it was, so there is a higher risks for falls. Please remove small rugs and furniture that is "in your way" and could increase the risk of  falls. Stretching exercises may help with fall prevention: Yoga and Tai Chi are some examples. Low impact exercise is better, so you are not very achy the next day.  -Sun screen and avoidance of direct sun light recommended. Caution with dehydration, if working outdoors be sure to drink enough fluids.  - Some medications are not safe as we age, increases the risk of side effects and can potentially interact with other medication you are also taken;  including some of over the counter medications. Be sure to let me know when you start a new medication even if it is a dietary/vitamin supplement.   -Healthy diet low in red meet/animal fat and sugar + regular physical activity is recommended.

## 2020-10-31 ENCOUNTER — Telehealth: Payer: Self-pay | Admitting: Family Medicine

## 2020-10-31 NOTE — Telephone Encounter (Signed)
The patient called to let Dr. [name] know that she increased her to amLODipine (NORVASC) 10 MG tablet and now her ankles, fett, and legs are swelling.  She was wanting to know should she decrease to 5 mg?  Please advise

## 2020-11-01 NOTE — Telephone Encounter (Signed)
I called and spoke with patient. She is going to wait until she sees her cardiologist on 3/25 to see if they are going to add any medications.

## 2020-11-01 NOTE — Telephone Encounter (Signed)
Amlodipine can be decreased from 10 mg to 5 mg and she can increase Losartan from 25 mg to 50 mg. Continue monitoring BP. Thanks, BJ

## 2020-11-07 ENCOUNTER — Telehealth: Payer: Self-pay | Admitting: Family Medicine

## 2020-11-07 DIAGNOSIS — I10 Essential (primary) hypertension: Secondary | ICD-10-CM

## 2020-11-07 MED ORDER — AMLODIPINE BESYLATE 5 MG PO TABS: 5.0000 mg | ORAL_TABLET | Freq: Every day | ORAL | 3 refills | Status: DC

## 2020-11-07 NOTE — Telephone Encounter (Signed)
See other phone encounter - pt wants to go back to 5mg  due to swelling.

## 2020-11-07 NOTE — Telephone Encounter (Signed)
Patient is calling and is requesting a refill for amLODipine (NORVASC) 5 MG tablet to be sent to [facility] [address] [place] [address]  Phone:  [phone] Fax:  [phone] CB is [phone]

## 2020-11-17 ENCOUNTER — Ambulatory Visit: Payer: Self-pay | Admitting: Cardiology

## 2020-11-17 NOTE — Progress Notes (Signed)
Date:  11/18/2020   ID:  Cheryl Barnes, Cheryl Barnes 09-23-1950, MRN [id number]  PCP:  [name], [name], MD  Cardiologist:  Rex Kras, DO, [facility] (established care 11/17/2020) Former Cardiology Providers: Dr. Meda Coffee, Dr. Einar Gip  REASON FOR CONSULT: Chest pain  REQUESTING PHYSICIAN:  [name], [name], [address] [place],  [place] [address]  Chief Complaint  Patient presents with  . Chest Pain  . Hypertension  . New Patient (Initial Visit)    HPI  [name] is a 70 y.o. female who presents to the office with a chief complaint of "chest pain." Patient's past medical history and cardiovascular risk factors include: hypertension, intermittent left bundle branch block, splenic artery aneurysm, atherosclerosis of aorta, family history of premature coronary artery disease, postmenopausal female, obesity due to excess calories, advanced age.  She is referred to the office at the request of [name], [name], MD for evaluation of chest pain.  Chest pain: Patient presents for evaluation of chest pain.  The symptoms have been going on for the last 3 months and the intensity has remained stable.  Patient states that the discomfort is located substernally, intensity is 8 out of 10 at its worse and currently 2 out of 10, not brought on by effort related activities, does not resolve with rest, self-limited.  She describes the discomfort as a tightness/pressure-like sensation, occurring 7-10 times per day, and each occurrence lasting for several minutes.  The discomfort at times radiates up her neck and left shoulder.  No improving or worsening factors.  No known prior cardiac history.  Last echo/stress test approximately at least 7 years ago.  Family history of premature coronary artery disease: Dad had a myocardial infarction less than the age of 104.  Benign essential hypertension: Patient states that her home blood pressures are around 150/80 mmHg.  She is currently on  antihypertensive medications.  Currently managed by her primary care provider.  Patient states that she is cognizant of her salt intake and no regular exercise program due to discomfort in her legs.  FUNCTIONAL STATUS: No structured exercise program or daily routine.   ALLERGIES: Allergies  Allergen Reactions  . Penicillins Itching and Rash    Has patient had a PCN reaction causing immediate rash, facial/tongue/throat swelling, SOB or lightheadedness with hypotension: Yes Has patient had a PCN reaction causing severe rash involving mucus membranes or skin necrosis: No Has patient had a PCN reaction that required hospitalization No Has patient had a PCN reaction occurring within the last 10 years: No If all of the above answers are "NO", then may proceed with Cephalosporin use.   . Seasonal Ic [Cholestatin] Other (See Comments)    Upper resp, sore throat, sneezing, etc.  . Tape Other (See Comments)    Tape causes some redness & causes BLISTERS if left on for too long  . Latex Itching and Rash    MEDICATION LIST PRIOR TO VISIT: Current Meds  Medication Sig  . amLODipine (NORVASC) 5 MG tablet Take 1 tablet (5 mg total) by mouth daily.  . Calcium Citrate-Vitamin D (CALCIUM + D PO) Take 600 mg by mouth 2 (two) times daily.  . cetirizine (ZYRTEC) 10 MG tablet Take 10 mg by mouth daily.  . meloxicam (MOBIC) 7.5 MG tablet Take 7.5 mg by mouth daily.  . valACYclovir (VALTREX) 1000 MG tablet Take 2 tablets by mouth as needed upon acute onset and asap repeat dose in 12 hours  . Vitamin D, Cholecalciferol, 25  MCG (1000 UT) TABS Take 1 tablet by mouth at bedtime.  . [DISCONTINUED] aspirin EC 81 MG tablet Take 1 tablet (81 mg total) by mouth daily. Swallow whole.  . [DISCONTINUED] atorvastatin (LIPITOR) 20 MG tablet Take 1 tablet (20 mg total) by mouth at bedtime.  . [DISCONTINUED] losartan (COZAAR) 25 MG tablet TAKE 1 TABLET(25 MG) BY MOUTH DAILY  . [DISCONTINUED] losartan (COZAAR) 50 MG tablet  Take 1 tablet (50 mg total) by mouth every evening.  . [DISCONTINUED] metoprolol tartrate (LOPRESSOR) 25 MG tablet Take 1 tablet (25 mg total) by mouth 2 (two) times daily.     PAST MEDICAL HISTORY: Past Medical History:  Diagnosis Date  . Allergy   . Diverticulitis   . Hypertension   . LBBB (left bundle branch block)   . Migraine    history    PAST SURGICAL HISTORY: Past Surgical History:  Procedure Laterality Date  . CESAREAN SECTION    . CHOLECYSTECTOMY    . FRACTURE SURGERY    . PARTIAL HYSTERECTOMY     Abdominal  . ROTATOR CUFF REPAIR Left   . ROTATOR CUFF REPAIR Right     FAMILY HISTORY: The patient family history includes Asthma in her maternal grandfather; Breast cancer in her mother; Cancer in her mother; Colon cancer in her father; Diabetes in her brother and father; Hypertension in her brother, father, and mother.  SOCIAL HISTORY:  The patient  reports that she has never smoked. She has never used smokeless tobacco. She reports that she does not drink alcohol and does not use drugs.  REVIEW OF SYSTEMS: Review of Systems  Constitutional: Negative for chills and fever.  HENT: Negative for hoarse voice and nosebleeds.   Eyes: Negative for discharge, double vision and pain.  Cardiovascular: Positive for chest pain. Negative for claudication, dyspnea on exertion, leg swelling, near-syncope, orthopnea, palpitations, paroxysmal nocturnal dyspnea and syncope.  Respiratory: Negative for hemoptysis and shortness of breath.   Musculoskeletal: Negative for muscle cramps and myalgias.  Gastrointestinal: Negative for abdominal pain, constipation, diarrhea, hematemesis, hematochezia, melena, nausea and vomiting.  Neurological: Negative for dizziness and light-headedness.    PHYSICAL EXAM: Vitals with BMI 11/18/2020 10/17/2020 10/09/2019  Height 5\' 1"  5\' 1"  5\' 1"   Weight 168 lbs 162 lbs 8 oz 166 lbs  BMI 31.76 70.62 37.62  Systolic 831 517 616  Diastolic 80 80 80  Pulse 68  67 73    CONSTITUTIONAL: Well-developed and well-nourished. No acute distress.  SKIN: Skin is warm and dry. No rash noted. No cyanosis. No pallor. No jaundice HEAD: Normocephalic and atraumatic.  EYES: No scleral icterus MOUTH/THROAT: Moist oral membranes.  NECK: No JVD present. No thyromegaly noted. No carotid bruits  LYMPHATIC: No visible cervical adenopathy.  CHEST Normal respiratory effort. No intercostal retractions  LUNGS: Clear to auscultation bilaterally. No stridor. No wheezes. No rales.  CARDIOVASCULAR: Regular rate and rhythm, positive S1-S2, no murmurs rubs or gallops appreciated. ABDOMINAL: No apparent ascites.  EXTREMITIES: No peripheral edema  HEMATOLOGIC: No significant bruising NEUROLOGIC: Oriented to person, place, and time. Nonfocal. Normal muscle tone.  PSYCHIATRIC: Normal mood and affect. Normal behavior. Cooperative  CARDIAC DATABASE: EKG: 11/18/2020: Normal sinus rhythm, 62 bpm, normal axis, T wave inversions in the anterior/anteroseptal leads suggestive of possible ischemia, without underlying injury pattern. Similar findings noted on prior EKG.     Echocardiogram: 12/20/2014: Technically difficult; LVEF 50-55%, normal, septal dyssynergy makes EF difficult to quantitate; overall low normal with apical hypokinesis; grade 1 diastolic dysfunction; trace MR  and TR.   Stress Testing: No results found for this or any previous visit from the past 1095 days.   Heart Catheterization: None  LABORATORY DATA: CBC Latest Ref Rng & Units 08/12/2019 05/21/2017 02/13/2017  WBC 4.0 - 10.5 K/uL 6.2 11.1(H) 5.5  Hemoglobin 12.0 - 15.0 g/dL 13.7 13.6 13.3  Hematocrit 36.0 - 46.0 % 41.2 38.9 39.5  Platelets 150.0 - 400.0 K/uL 168.0 159 197.0    CMP Latest Ref Rng & Units 10/17/2020 10/09/2019 08/12/2019  Glucose 70 - 99 mg/dL 98 89 98  BUN 6 - 23 mg/dL 14 14 14   Creatinine 0.40 - 1.20 mg/dL 0.73 0.62 0.75  Sodium 135 - 145 mEq/L 142 142 138  Potassium 3.5 - 5.1 mEq/L 4.2  3.8 4.2  Chloride 96 - 112 mEq/L 106 109 103  CO2 19 - 32 mEq/L 27 27 29   Calcium 8.4 - 10.5 mg/dL 9.8 9.2 9.3  Total Protein 6.0 - 8.3 g/dL 7.3 - -  Total Bilirubin 0.2 - 1.2 mg/dL 0.6 - -  Alkaline Phos 39 - 117 U/L 92 - -  AST 0 - 37 U/L 21 - -  ALT 0 - 35 U/L 25 - -    Lipid Panel     Component Value Date/Time   CHOL 178 10/17/2020 0958   TRIG 140.0 10/17/2020 0958   HDL 53.50 10/17/2020 0958   CHOLHDL 3 10/17/2020 0958   VLDL 28.0 10/17/2020 0958   LDLCALC 96 10/17/2020 0958   LDLDIRECT 120.6 08/22/2011 0821    No components found for: NTPROBNP No results for input(s): PROBNP in the last 8760 hours. Recent Labs    10/17/20 0958  TSH 0.51    BMP Recent Labs    10/17/20 0958  NA 142  K 4.2  CL 106  CO2 27  GLUCOSE 98  BUN 14  CREATININE 0.73  CALCIUM 9.8    HEMOGLOBIN A1C Lab Results  Component Value Date   HGBA1C 5.5 12/19/2014   MPG 111 12/19/2014    IMPRESSION:    ICD-10-CM   1. Precordial pain  R07.2 CT CORONARY MORPH W/CTA COR W/SCORE W/CA W/CM &/OR WO/CM    CT CORONARY FRACTIONAL FLOW RESERVE DATA PREP    CT CORONARY FRACTIONAL FLOW RESERVE FLUID ANALYSIS    PCV ECHOCARDIOGRAM COMPLETE    DISCONTINUED: aspirin EC 81 MG tablet    DISCONTINUED: metoprolol tartrate (LOPRESSOR) 25 MG tablet  2. Essential hypertension  I10 EKG [id number]    Basic metabolic panel    Magnesium    DISCONTINUED: losartan (COZAAR) 50 MG tablet  3. LBBB (left bundle branch block)  I44.7   4. Atherosclerosis of aorta (HCC)  I70.0 DISCONTINUED: atorvastatin (LIPITOR) 20 MG tablet  5. Family history of premature CAD  Z82.49 DISCONTINUED: atorvastatin (LIPITOR) 20 MG tablet     RECOMMENDATIONS: Mellony Danziger is a 70 y.o. female whose past medical history and cardiac risk factors include: hypertension, intermittent left bundle branch block, splenic artery aneurysm, atherosclerosis of aorta, family history of premature coronary artery disease, postmenopausal female,  obesity due to excess calories, advanced age.  1. Precordial pain Her discomfort could be probably cardiac in origin.  However she has multiple cardiovascular risk factors including premature CAD in the family, hypertension, estimated 10-year risk of ASCVD greater than 10%, postmenopausal female, advanced age. EKG shows normal sinus rhythm with ST changes with T wave inversions in the anterior anteroseptal leads suggestive of possible ischemia. Therefore the shared decision was to proceed with an  ischemic evaluation. Echocardiogram will be ordered to evaluate for structural heart disease and left ventricular systolic function. Plan coronary CTA plus or minus CT FFR. Start Lopressor 25 mg p.o. twice daily  2. Essential hypertension Office blood pressures not well controlled. Home blood pressure is not well controlled. Patient requesting assistance with her blood pressure management. Increase losartan to 50 mg p.o. q. P.m. Blood work in 1 week to evaluate kidney function and electrolytes  3. LBBB (left bundle branch block) Appears to be intermittent. Continue to monitor  4. Atherosclerosis of aorta ([facility]) Noted on prior imaging studies. Calculated estimated 10-year risk of ASCVD approximately 13% based on hemodynamics noted today. Recommend aspirin 81 mg p.o. daily and start Lipitor 20 mg p.o. nightly. Patient is asked to be cognizant for any signs of bleeding given her history of diverticulosis/diverticulitis.  As long as she remains a low bleeding risk candidate would continue aspirin 81 mg p.o. daily  5. Family history of premature CAD Work-up as outlined above.   FINAL MEDICATION LIST END OF ENCOUNTER: Meds ordered this encounter  Medications  . DISCONTD: aspirin EC 81 MG tablet    Sig: Take 1 tablet (81 mg total) by mouth daily. Swallow whole.    Dispense:  90 tablet    Refill:  3  . DISCONTD: metoprolol tartrate (LOPRESSOR) 25 MG tablet    Sig: Take 1 tablet (25 mg total) by  mouth 2 (two) times daily.    Dispense:  60 tablet    Refill:  2  . DISCONTD: losartan (COZAAR) 50 MG tablet    Sig: Take 1 tablet (50 mg total) by mouth every evening.    Dispense:  90 tablet    Refill:  0  . DISCONTD: atorvastatin (LIPITOR) 20 MG tablet    Sig: Take 1 tablet (20 mg total) by mouth at bedtime.    Dispense:  90 tablet    Refill:  0    Medications Discontinued During This Encounter  Medication Reason  . Multiple Vitamin (MULTIVITAMIN) capsule Error  . docusate sodium (COLACE) 100 MG capsule Error  . Calcium Carb-Cholecalciferol 600-800 MG-UNIT TABS Error  . losartan (COZAAR) 25 MG tablet Dose change     Current Outpatient Medications:  .  amLODipine (NORVASC) 5 MG tablet, Take 1 tablet (5 mg total) by mouth daily., Disp: 90 tablet, Rfl: 3 .  Calcium Citrate-Vitamin D (CALCIUM + D PO), Take 600 mg by mouth 2 (two) times daily., Disp: , Rfl:  .  cetirizine (ZYRTEC) 10 MG tablet, Take 10 mg by mouth daily., Disp: , Rfl:  .  meloxicam (MOBIC) 7.5 MG tablet, Take 7.5 mg by mouth daily., Disp: , Rfl:  .  valACYclovir (VALTREX) 1000 MG tablet, Take 2 tablets by mouth as needed upon acute onset and asap repeat dose in 12 hours, Disp: 16 tablet, Rfl: 5 .  Vitamin D, Cholecalciferol, 25 MCG (1000 UT) TABS, Take 1 tablet by mouth at bedtime., Disp: , Rfl:  .  aspirin EC 81 MG tablet, Take 1 tablet (81 mg total) by mouth daily. Swallow whole., Disp: 90 tablet, Rfl: 3 .  atorvastatin (LIPITOR) 20 MG tablet, Take 1 tablet (20 mg total) by mouth at bedtime., Disp: 90 tablet, Rfl: 0 .  losartan (COZAAR) 50 MG tablet, Take 1 tablet (50 mg total) by mouth every evening., Disp: 90 tablet, Rfl: 0 .  metoprolol tartrate (LOPRESSOR) 25 MG tablet, Take 1 tablet (25 mg total) by mouth 2 (two) times daily., Disp: 60 tablet,  Rfl: 2  Orders Placed This Encounter  Procedures  . CT CORONARY MORPH W/CTA COR W/SCORE W/CA W/CM &/OR WO/CM  . CT CORONARY FRACTIONAL FLOW RESERVE DATA PREP  . CT  CORONARY FRACTIONAL FLOW RESERVE FLUID ANALYSIS  . Basic metabolic panel  . Magnesium  . EKG 12-Lead  . PCV ECHOCARDIOGRAM COMPLETE    There are no Patient Instructions on file for this visit.   --Continue cardiac medications as reconciled in final medication list. --Return in about 4 weeks (around 12/16/2020) for Reevaluation of, Chest pain. Or sooner if needed. --Continue follow-up with your primary care physician regarding the management of your other chronic comorbid conditions.  Patient's questions and concerns were addressed to her satisfaction. She voices understanding of the instructions provided during this encounter.   This note was created using a voice recognition software as a result there may be grammatical errors inadvertently enclosed that do not reflect the nature of this encounter. Every attempt is made to correct such errors.  Rex Kras, [place], [facility]  Pager: [phone] Office: [phone]

## 2020-11-18 ENCOUNTER — Ambulatory Visit: Payer: Medicare Other | Admitting: Cardiology

## 2020-11-18 ENCOUNTER — Other Ambulatory Visit: Payer: Self-pay

## 2020-11-18 ENCOUNTER — Encounter: Payer: Self-pay | Admitting: Cardiology

## 2020-11-18 VITALS — BP 155/80 | HR 68 | Temp 98.0°F | Resp 16 | Ht 61.0 in | Wt 168.0 lb

## 2020-11-18 DIAGNOSIS — R072 Precordial pain: Secondary | ICD-10-CM

## 2020-11-18 DIAGNOSIS — I10 Essential (primary) hypertension: Secondary | ICD-10-CM

## 2020-11-18 DIAGNOSIS — I700 Atherosclerosis of aorta: Secondary | ICD-10-CM

## 2020-11-18 DIAGNOSIS — Z8249 Family history of ischemic heart disease and other diseases of the circulatory system: Secondary | ICD-10-CM

## 2020-11-18 DIAGNOSIS — R079 Chest pain, unspecified: Secondary | ICD-10-CM

## 2020-11-18 DIAGNOSIS — I447 Left bundle-branch block, unspecified: Secondary | ICD-10-CM | POA: Diagnosis not present

## 2020-11-18 MED ORDER — ASPIRIN EC 81 MG PO TBEC: 81.0000 mg | DELAYED_RELEASE_TABLET | Freq: Every day | ORAL | 3 refills | Status: DC

## 2020-11-18 MED ORDER — LOSARTAN POTASSIUM 50 MG PO TABS: 50.0000 mg | ORAL_TABLET | Freq: Every evening | ORAL | 0 refills | Status: DC

## 2020-11-18 MED ORDER — METOPROLOL TARTRATE 25 MG PO TABS: 25.0000 mg | ORAL_TABLET | Freq: Two times a day (BID) | ORAL | 2 refills | Status: DC

## 2020-11-18 MED ORDER — ATORVASTATIN CALCIUM 20 MG PO TABS: 20.0000 mg | ORAL_TABLET | Freq: Every evening | ORAL | 0 refills | Status: DC

## 2020-11-25 ENCOUNTER — Other Ambulatory Visit: Payer: Self-pay | Admitting: Cardiology

## 2020-11-25 ENCOUNTER — Other Ambulatory Visit: Payer: Self-pay

## 2020-11-25 DIAGNOSIS — I10 Essential (primary) hypertension: Secondary | ICD-10-CM | POA: Diagnosis not present

## 2020-11-26 LAB — BASIC METABOLIC PANEL
BUN/Creatinine Ratio: 25 (ref 12–28)
BUN: 17 mg/dL (ref 8–27)
CO2: 21 mmol/L (ref 20–29)
Calcium: 9.9 mg/dL (ref 8.7–10.3)
Chloride: 105 mmol/L (ref 96–106)
Creatinine, Ser: 0.68 mg/dL (ref 0.57–1.00)
Glucose: 114 mg/dL — ABNORMAL HIGH (ref 65–99)
Potassium: 4.2 mmol/L (ref 3.5–5.2)
Sodium: 142 mmol/L (ref 134–144)
eGFR: 94 mL/min/{1.73_m2} (ref >59)

## 2020-11-26 LAB — MAGNESIUM: Magnesium: 2.1 mg/dL (ref 1.6–2.3)

## 2020-11-28 ENCOUNTER — Telehealth (HOSPITAL_COMMUNITY): Payer: Self-pay | Admitting: *Deleted

## 2020-11-28 ENCOUNTER — Telehealth (HOSPITAL_COMMUNITY): Payer: Self-pay | Admitting: Emergency Medicine

## 2020-11-28 NOTE — Telephone Encounter (Signed)
Attempted to call patient regarding upcoming cardiac CT appointment. °Left message on voicemail with name and callback number °[name] RN Navigator Cardiac Imaging °[facility] °[phone] Office °[phone] Cell ° °

## 2020-11-28 NOTE — Telephone Encounter (Signed)
Pt returning call regarding upcoming cardiac imaging study; pt verbalizes understanding of appt date/time, parking situation and where to check in, pre-test NPO status and medications ordered, and verified current allergies; name and call back number provided for further questions should they arise  Gordy Clement RN [facility] [phone] office [phone] cell  Pt states she got blood work on April 1 from the [facility] off [address].

## 2020-11-30 ENCOUNTER — Other Ambulatory Visit: Payer: Self-pay

## 2020-11-30 ENCOUNTER — Ambulatory Visit (HOSPITAL_COMMUNITY)
Admission: RE | Admit: 2020-11-30 | Discharge: 2020-11-30 | Disposition: A | Discharge status: Home / Self Care | Payer: Medicare Other | Source: Ambulatory Visit | Attending: Cardiology | Admitting: Cardiology

## 2020-11-30 DIAGNOSIS — K449 Diaphragmatic hernia without obstruction or gangrene: Secondary | ICD-10-CM | POA: Insufficient documentation

## 2020-11-30 DIAGNOSIS — I700 Atherosclerosis of aorta: Secondary | ICD-10-CM | POA: Insufficient documentation

## 2020-11-30 DIAGNOSIS — I2510 Atherosclerotic heart disease of native coronary artery without angina pectoris: Secondary | ICD-10-CM | POA: Insufficient documentation

## 2020-11-30 DIAGNOSIS — R072 Precordial pain: Secondary | ICD-10-CM | POA: Insufficient documentation

## 2020-11-30 DIAGNOSIS — Z006 Encounter for examination for normal comparison and control in clinical research program: Secondary | ICD-10-CM

## 2020-11-30 MED ORDER — NITROGLYCERIN 0.4 MG SL SUBL
SUBLINGUAL_TABLET | SUBLINGUAL | Status: AC
  Filled 2020-11-30: qty 2

## 2020-11-30 MED ORDER — NITROGLYCERIN 0.4 MG SL SUBL
0.8000 mg | SUBLINGUAL_TABLET | Freq: Once | SUBLINGUAL | Status: AC
  Administered 2020-11-30: 0.8 mg via SUBLINGUAL

## 2020-11-30 MED ORDER — IOHEXOL 350 MG/ML SOLN
80.0000 mL | Freq: Once | INTRAVENOUS | Status: AC | PRN
  Administered 2020-11-30: 80 mL via INTRAVENOUS

## 2020-11-30 NOTE — Progress Notes (Signed)
Patient tolerated CT well. Drank water and ate chips after. Vital signs stable encourage to drink water throughout day.Reasons explained and verbalized understanding. Ambulated steady gait.

## 2020-11-30 NOTE — Research (Signed)
IDENTIFY Informed Consent                  Subject Name: Cheryl Barnes   Subject met inclusion and exclusion criteria.  The informed consent form, study requirements and expectations were reviewed with the subject and questions and concerns were addressed prior to the signing of the consent form.  The subject verbalized understanding of the trial requirements.  The subject agreed to participate in the IDENTIFY trial and signed the informed consent at 10:14 on 11/30/20.  The informed consent was obtained prior to performance of any protocol-specific procedures for the subject.  A copy of the signed informed consent was given to the subject and a copy was placed in the subject's medical record.   Meade Maw , [employer]

## 2020-12-01 ENCOUNTER — Ambulatory Visit: Payer: Medicare Other

## 2020-12-01 ENCOUNTER — Other Ambulatory Visit: Payer: Self-pay

## 2020-12-01 DIAGNOSIS — R072 Precordial pain: Secondary | ICD-10-CM

## 2020-12-12 ENCOUNTER — Telehealth: Payer: Self-pay | Admitting: Family Medicine

## 2020-12-12 ENCOUNTER — Ambulatory Visit: Payer: Self-pay | Admitting: Cardiology

## 2020-12-12 NOTE — Telephone Encounter (Signed)
Patient had her AWV with Dr. [name] on 10/17/2020

## 2020-12-12 NOTE — Telephone Encounter (Signed)
Left message for patient to call back and schedule Medicare Annual Wellness Visit (AWV) either virtually or in office.   Last AWV ;10/17/20  please schedule at anytime with [facility] Nurse Health Advisor 1 or 2   This should be a 45 minute visit.

## 2020-12-13 ENCOUNTER — Ambulatory Visit: Payer: Medicare Other | Admitting: Physician Assistant

## 2020-12-29 ENCOUNTER — Telehealth: Payer: Self-pay

## 2020-12-29 NOTE — Telephone Encounter (Signed)
Patient called and said that she is having side effects from the Metoprolol and Atorvastatin. Per Dr. Terri Skains, cut Atorvastatin half and stop metoprolol. Patient understood and will keep her f/u appt next week.

## 2021-01-04 ENCOUNTER — Ambulatory Visit: Payer: Medicare Other | Admitting: Cardiology

## 2021-01-04 ENCOUNTER — Encounter: Payer: Self-pay | Admitting: Cardiology

## 2021-01-04 ENCOUNTER — Other Ambulatory Visit: Payer: Self-pay

## 2021-01-04 VITALS — BP 107/68 | HR 76 | Resp 16 | Ht 61.0 in | Wt 164.8 lb

## 2021-01-04 DIAGNOSIS — I2510 Atherosclerotic heart disease of native coronary artery without angina pectoris: Secondary | ICD-10-CM | POA: Diagnosis not present

## 2021-01-04 DIAGNOSIS — R072 Precordial pain: Secondary | ICD-10-CM

## 2021-01-04 DIAGNOSIS — I700 Atherosclerosis of aorta: Secondary | ICD-10-CM

## 2021-01-04 DIAGNOSIS — I447 Left bundle-branch block, unspecified: Secondary | ICD-10-CM

## 2021-01-04 DIAGNOSIS — I10 Essential (primary) hypertension: Secondary | ICD-10-CM | POA: Diagnosis not present

## 2021-01-04 DIAGNOSIS — Z8249 Family history of ischemic heart disease and other diseases of the circulatory system: Secondary | ICD-10-CM

## 2021-01-04 MED ORDER — PRAVASTATIN SODIUM 20 MG PO TABS: 20.0000 mg | ORAL_TABLET | Freq: Every evening | ORAL | 0 refills | Status: DC

## 2021-01-04 NOTE — Progress Notes (Signed)
Date:  01/04/2021   ID:  Roselind Messier, DOB April 28, 1951, MRN [id number]  PCP:  [name], [name], MD  Cardiologist:  Rex Kras, DO, [facility] (established care 11/17/2020) Former Cardiology Providers: Dr. Meda Coffee, Dr. Einar Gip  Date: 01/04/21 Last Office Visit: 11/18/2020  Chief Complaint  Patient presents with  . Precordial pain  . Follow-up  . Results    HPI  Cheryl Barnes is a 70 y.o. female who presents to the office with a chief complaint of "chest pain." Patient's past medical history and cardiovascular risk factors include: hypertension, intermittent left bundle branch block, splenic artery aneurysm, atherosclerosis of aorta, family history of premature coronary artery disease, postmenopausal female, obesity due to excess calories, advanced age.  She is referred to the office at the request of [name], [name], MD for evaluation of chest pain.  Patient initially presented to the office back in March 2022 for evaluation of precordial pain.  Patient is characteristic of precordial pain were most likely noncardiac in nature; however, due to multiple cardiovascular risk factors that shared decision was to proceed with coronary CTA for further risk stratification.  Since last office visit patient states that she continues to have chest pain which is same in characteristic.  The discomfort is not brought on by effort related activities and does not resolve with rest.  The intensity, frequency, and duration has not changed since last evaluation.  Patient was started on beta-blocker therapy given her chest pain and also prior to the coronary CTA.  The medication was discontinued as she started experiencing symptoms of generalized tired and depression.  She was also started on statin therapy (atorvastatin) which she tolerated initially but started complaining of joint pain and muscle pain.  I advised her to cut the medication in half which she did for a couple weeks but as of last Saturday  she stopped it completely.    Since stopping her statin therapy her symptoms have improved but she is not back at baseline.    Independently reviewed the coronary CTA images with the patient at today's office visit.  She had a total coronary calcium score of 0 but is noted to have mild nonobstructive disease in the LAD distribution due to noncalcified plaque.    Echocardiogram notes preserved LVEF, grade 1 diastolic impairment, no significant valvular heart disease.  Family history of premature coronary artery disease: Dad had a myocardial infarction less than the age of 81.  At the last office visit patient requested my assistance with antihypertensive medication management.  I requested her to increase her losartan to 50 mg p.o. daily.  However she forgot to do so.  She has been checking her blood pressures at home and the average SBP is 120-130 mmHg and DBP 60-22mHg.   FUNCTIONAL STATUS: No structured exercise program or daily routine.   ALLERGIES: Allergies  Allergen Reactions  . Penicillins Itching and Rash    Has patient had a PCN reaction causing immediate rash, facial/tongue/throat swelling, SOB or lightheadedness with hypotension: Yes Has patient had a PCN reaction causing severe rash involving mucus membranes or skin necrosis: No Has patient had a PCN reaction that required hospitalization No Has patient had a PCN reaction occurring within the last 10 years: No If all of the above answers are "NO", then may proceed with Cephalosporin use.   . Seasonal Ic [Cholestatin] Other (See Comments)    Upper resp, sore throat, sneezing, etc.  . Tape Other (See Comments)    Tape causes  some redness & causes BLISTERS if left on for too long  . Latex Itching and Rash    MEDICATION LIST PRIOR TO VISIT: Current Meds  Medication Sig  . amLODipine (NORVASC) 5 MG tablet Take 1 tablet (5 mg total) by mouth daily.  Marland Kitchen aspirin EC 81 MG tablet Take 1 tablet (81 mg total) by mouth daily.  Swallow whole.  . Calcium Citrate-Vitamin D (CALCIUM + D PO) Take 600 mg by mouth 2 (two) times daily.  . cetirizine (ZYRTEC) 10 MG tablet Take 10 mg by mouth daily.  Marland Kitchen losartan (COZAAR) 25 MG tablet Take 25 mg by mouth every morning.  . pravastatin (PRAVACHOL) 20 MG tablet Take 1 tablet (20 mg total) by mouth every evening.  . valACYclovir (VALTREX) 1000 MG tablet Take 2 tablets by mouth as needed upon acute onset and asap repeat dose in 12 hours  . Vitamin D, Cholecalciferol, 25 MCG (1000 UT) TABS Take 1 tablet by mouth at bedtime.  . [DISCONTINUED] losartan (COZAAR) 50 MG tablet Take 1 tablet (50 mg total) by mouth every evening.     PAST MEDICAL HISTORY: Past Medical History:  Diagnosis Date  . Allergy   . Diverticulitis   . Hypertension   . LBBB (left bundle branch block)   . Migraine    history  . Nonobstructive atherosclerosis of coronary artery     PAST SURGICAL HISTORY: Past Surgical History:  Procedure Laterality Date  . CESAREAN SECTION    . CHOLECYSTECTOMY    . FRACTURE SURGERY    . PARTIAL HYSTERECTOMY     Abdominal  . ROTATOR CUFF REPAIR Left   . ROTATOR CUFF REPAIR Right     FAMILY HISTORY: The patient family history includes Asthma in her maternal grandfather; Breast cancer in her mother; Cancer in her mother; Colon cancer in her father; Diabetes in her brother and father; Hypertension in her brother, father, and mother.  SOCIAL HISTORY:  The patient  reports that she has never smoked. She has never used smokeless tobacco. She reports that she does not drink alcohol and does not use drugs.  REVIEW OF SYSTEMS: Review of Systems  Constitutional: Negative for chills and fever.  HENT: Negative for hoarse voice and nosebleeds.   Eyes: Negative for discharge, double vision and pain.  Cardiovascular: Positive for chest pain. Negative for claudication, dyspnea on exertion, leg swelling, near-syncope, orthopnea, palpitations, paroxysmal nocturnal dyspnea and  syncope.  Respiratory: Negative for hemoptysis and shortness of breath.   Musculoskeletal: Positive for joint pain and muscle cramps. Negative for myalgias.  Gastrointestinal: Negative for abdominal pain, constipation, diarrhea, hematemesis, hematochezia, melena, nausea and vomiting.  Neurological: Negative for dizziness and light-headedness.    PHYSICAL EXAM: Vitals with BMI 01/04/2021 11/30/2020 11/30/2020  Height 5' 1"  - -  Weight 164 lbs 13 oz - -  BMI 66.29 - -  Systolic 476 546 503  Diastolic 68 74 89  Pulse 76 61 56    CONSTITUTIONAL: Well-developed and well-nourished. No acute distress.  SKIN: Skin is warm and dry. No rash noted. No cyanosis. No pallor. No jaundice HEAD: Normocephalic and atraumatic.  EYES: No scleral icterus MOUTH/THROAT: Moist oral membranes.  NECK: No JVD present. No thyromegaly noted. No carotid bruits  LYMPHATIC: No visible cervical adenopathy.  CHEST Normal respiratory effort. No intercostal retractions  LUNGS: Clear to auscultation bilaterally. No stridor. No wheezes. No rales.  CARDIOVASCULAR: Regular rate and rhythm, positive S1-S2, no murmurs rubs or gallops appreciated. ABDOMINAL: No apparent ascites.  EXTREMITIES:  No peripheral edema  HEMATOLOGIC: No significant bruising NEUROLOGIC: Oriented to person, place, and time. Nonfocal. Normal muscle tone.  PSYCHIATRIC: Normal mood and affect. Normal behavior. Cooperative  CARDIAC DATABASE: EKG: 11/18/2020: Normal sinus rhythm, 62 bpm, normal axis, T wave inversions in the anterior/anteroseptal leads suggestive of possible ischemia, without underlying injury pattern. Similar findings noted on prior EKG.     Echocardiogram: 12/01/2020: Left ventricle cavity is normal in size and wall thickness. Normal global wall motion. Normal LV systolic function with EF 64%. Doppler evidence of grade I (impaired) diastolic dysfunction, normal LAP.  No significant valvular abnormality.  No evidence of pulmonary  hypertension.  Stress Testing: No results found for this or any previous visit from the past 1095 days.  CCTA 11/30/2020 Total coronary calcium score of 0. Normal coronary origin with right-dominance. CAD-RADS = 2. Mild stenosis (25-49%) at the mid LAD due to noncalcified plaque. Aortic atherosclerosis. No significant incidental noncardiac findings are noted.  Heart Catheterization: None  LABORATORY DATA: CBC Latest Ref Rng & Units 08/12/2019 05/21/2017 02/13/2017  WBC 4.0 - 10.5 K/uL 6.2 11.1(H) 5.5  Hemoglobin 12.0 - 15.0 g/dL 13.7 13.6 13.3  Hematocrit 36.0 - 46.0 % 41.2 38.9 39.5  Platelets 150.0 - 400.0 K/uL 168.0 159 197.0    CMP Latest Ref Rng & Units 11/25/2020 10/17/2020 10/09/2019  Glucose 65 - 99 mg/dL 114(H) 98 89  BUN 8 - 27 mg/dL 17 14 14   Creatinine 0.57 - 1.00 mg/dL 0.68 0.73 0.62  Sodium 134 - 144 mmol/L 142 142 142  Potassium 3.5 - 5.2 mmol/L 4.2 4.2 3.8  Chloride 96 - 106 mmol/L 105 106 109  CO2 20 - 29 mmol/L 21 27 27   Calcium 8.7 - 10.3 mg/dL 9.9 9.8 9.2  Total Protein 6.0 - 8.3 g/dL - 7.3 -  Total Bilirubin 0.2 - 1.2 mg/dL - 0.6 -  Alkaline Phos 39 - 117 U/L - 92 -  AST 0 - 37 U/L - 21 -  ALT 0 - 35 U/L - 25 -    Lipid Panel     Component Value Date/Time   CHOL 178 10/17/2020 0958   TRIG 140.0 10/17/2020 0958   HDL 53.50 10/17/2020 0958   CHOLHDL 3 10/17/2020 0958   VLDL 28.0 10/17/2020 0958   LDLCALC 96 10/17/2020 0958   LDLDIRECT 120.6 08/22/2011 0821    No components found for: NTPROBNP No results for input(s): PROBNP in the last 8760 hours. Recent Labs    10/17/20 0958  TSH 0.51    BMP Recent Labs    10/17/20 0958 11/25/20 1023  NA 142 142  K 4.2 4.2  CL 106 105  CO2 27 21  GLUCOSE 98 114*  BUN 14 17  CREATININE 0.73 0.68  CALCIUM 9.8 9.9    HEMOGLOBIN A1C Lab Results  Component Value Date   HGBA1C 5.5 12/19/2014   MPG 111 12/19/2014    IMPRESSION:    ICD-10-CM   1. Precordial pain  R07.2   2. Essential  hypertension  I10   3. LBBB (left bundle branch block)  I44.7   4. Atherosclerosis of aorta (HCC)  I70.0 pravastatin (PRAVACHOL) 20 MG tablet    Lipid Panel With LDL/HDL Ratio    LDL cholesterol, direct    CMP14+EGFR  5. Family history of premature CAD  Z82.49 Lipid Panel With LDL/HDL Ratio    LDL cholesterol, direct    CMP14+EGFR  6. Nonobstructive atherosclerosis of coronary artery  I25.10 pravastatin (PRAVACHOL) 20 MG tablet  Lipid Panel With LDL/HDL Ratio    LDL cholesterol, direct    CMP14+EGFR     RECOMMENDATIONS: Heily Carlucci is a 70 y.o. female whose past medical history and cardiac risk factors include: hypertension, intermittent left bundle branch block, splenic artery aneurysm, atherosclerosis of aorta, family history of premature coronary artery disease, postmenopausal female, obesity due to excess calories, advanced age.  Precordial pain Patient continues to have similar precordial pain.   Coronary CTA since last visit notes mild CAD in the LAD distribution closer to 25% stenosis due to noncalcified plaque. Secondary prevention discussed.   Continue aspirin and statin therapy. I have asked her to discuss noncardiac causes of precordial pain with her primary care provider.  Nonobstructive CAD: Educated on the importance of secondary prevention. Continue aspirin 81 mg p.o. daily. Unable to tolerate Lipitor 20 mg p.o. nightly. Will transition to pravastatin 20 mg p.o. nightly. We will check a CMP and lipid profile prior to next office visit. Echocardiogram results reviewed with the patient at today's office visit as well.  Essential hypertension Office blood pressures well controlled. She requested BP management assistance at the prior OV.  Recommended increase in Losartan; however, she forgot to go up on the dosage.  Home BP log reviewed.  Continue current treatment.   LBBB (left bundle branch block) Appears to be intermittent. Continue to  monitor  Atherosclerosis of aorta [facility]) Noted on prior imaging studies. Calculated estimated 10-year risk of ASCVD approximately 13% based on hemodynamics noted today. Did not tolerate Lipitor 20 mg p.o. nightly. We will transition to pravastatin 20 mg p.o. nightly given her aortic atherosclerosis and mild nonobstructive CAD.  Family history of premature CAD Has undergone ischemic evaluation as outlined above. Educated on importance of secondary prevention. Continue aspirin and statin therapy.   FINAL MEDICATION LIST END OF ENCOUNTER: Meds ordered this encounter  Medications  . pravastatin (PRAVACHOL) 20 MG tablet    Sig: Take 1 tablet (20 mg total) by mouth every evening.    Dispense:  90 tablet    Refill:  0    Medications Discontinued During This Encounter  Medication Reason  . atorvastatin (LIPITOR) 20 MG tablet Error  . meloxicam (MOBIC) 7.5 MG tablet Error  . losartan (COZAAR) 50 MG tablet Dose change     Current Outpatient Medications:  .  amLODipine (NORVASC) 5 MG tablet, Take 1 tablet (5 mg total) by mouth daily., Disp: 90 tablet, Rfl: 3 .  aspirin EC 81 MG tablet, Take 1 tablet (81 mg total) by mouth daily. Swallow whole., Disp: 90 tablet, Rfl: 3 .  Calcium Citrate-Vitamin D (CALCIUM + D PO), Take 600 mg by mouth 2 (two) times daily., Disp: , Rfl:  .  cetirizine (ZYRTEC) 10 MG tablet, Take 10 mg by mouth daily., Disp: , Rfl:  .  losartan (COZAAR) 25 MG tablet, Take 25 mg by mouth every morning., Disp: , Rfl:  .  pravastatin (PRAVACHOL) 20 MG tablet, Take 1 tablet (20 mg total) by mouth every evening., Disp: 90 tablet, Rfl: 0 .  valACYclovir (VALTREX) 1000 MG tablet, Take 2 tablets by mouth as needed upon acute onset and asap repeat dose in 12 hours, Disp: 16 tablet, Rfl: 5 .  Vitamin D, Cholecalciferol, 25 MCG (1000 UT) TABS, Take 1 tablet by mouth at bedtime., Disp: , Rfl:   Orders Placed This Encounter  Procedures  . Lipid Panel With LDL/HDL Ratio  . LDL  cholesterol, direct  . CMP14+EGFR    There are  no Patient Instructions on file for this visit.   --Continue cardiac medications as reconciled in final medication list. --Return in about 6 months (around 07/07/2021) for Follow up non-obstructive CAD. . Or sooner if needed. --Continue follow-up with your primary care physician regarding the management of your other chronic comorbid conditions.  Patient's questions and concerns were addressed to her satisfaction. She voices understanding of the instructions provided during this encounter.   This note was created using a voice recognition software as a result there may be grammatical errors inadvertently enclosed that do not reflect the nature of this encounter. Every attempt is made to correct such errors.  Rex Kras, [place], [facility]  Pager: [phone] Office: [phone]

## 2021-01-06 ENCOUNTER — Other Ambulatory Visit: Payer: Self-pay

## 2021-01-06 DIAGNOSIS — Z8249 Family history of ischemic heart disease and other diseases of the circulatory system: Secondary | ICD-10-CM

## 2021-01-06 DIAGNOSIS — I700 Atherosclerosis of aorta: Secondary | ICD-10-CM

## 2021-01-06 DIAGNOSIS — I2510 Atherosclerotic heart disease of native coronary artery without angina pectoris: Secondary | ICD-10-CM

## 2021-01-24 ENCOUNTER — Ambulatory Visit: Payer: Medicare Other | Admitting: Physician Assistant

## 2021-01-30 ENCOUNTER — Other Ambulatory Visit: Payer: Self-pay | Admitting: Family Medicine

## 2021-01-30 ENCOUNTER — Ambulatory Visit: Payer: Self-pay | Admitting: Cardiology

## 2021-01-30 DIAGNOSIS — Z1231 Encounter for screening mammogram for malignant neoplasm of breast: Secondary | ICD-10-CM

## 2021-01-31 ENCOUNTER — Ambulatory Visit: Payer: Medicare Other | Admitting: Physician Assistant

## 2021-02-13 ENCOUNTER — Other Ambulatory Visit: Payer: Self-pay | Admitting: Cardiology

## 2021-02-13 DIAGNOSIS — I700 Atherosclerosis of aorta: Secondary | ICD-10-CM

## 2021-02-13 DIAGNOSIS — Z8249 Family history of ischemic heart disease and other diseases of the circulatory system: Secondary | ICD-10-CM

## 2021-02-13 DIAGNOSIS — R072 Precordial pain: Secondary | ICD-10-CM

## 2021-02-20 ENCOUNTER — Other Ambulatory Visit: Payer: Self-pay

## 2021-02-20 MED ORDER — LOSARTAN POTASSIUM 50 MG PO TABS: 25.0000 mg | ORAL_TABLET | Freq: Every day | ORAL | 3 refills | Status: DC

## 2021-03-20 ENCOUNTER — Other Ambulatory Visit: Payer: Self-pay

## 2021-03-20 ENCOUNTER — Telehealth: Payer: Self-pay | Admitting: Cardiology

## 2021-03-20 MED ORDER — LOSARTAN POTASSIUM 50 MG PO TABS: 50.0000 mg | ORAL_TABLET | Freq: Every day | ORAL | 0 refills | Status: DC

## 2021-03-20 MED ORDER — LOSARTAN POTASSIUM 50 MG PO TABS: 50.0000 mg | ORAL_TABLET | Freq: Every day | ORAL | 3 refills | Status: DC

## 2021-03-20 NOTE — Telephone Encounter (Signed)
Pt called due to confused on losartan (COZAAR) 50 MG tablet. Pt mention you told her to take half a pill in the morning and bedtime. But in the Rx you have to take it only in morning. Pt mention she was on 25 in morning. Pt would like to take a full pill of '25mg'$  instead of half of '50mg'$ . Please advise.

## 2021-03-20 NOTE — Telephone Encounter (Signed)
Pt is calling to speak with a nurse about a Medication problem she is having. Call back number [phone]

## 2021-03-21 NOTE — Telephone Encounter (Signed)
Per Dr. Terri Skains refilled pt's Losartan as '50mg'$ . Pt will now take 1 tab in the morning for a week, will call us back with in a week. To see how her bp is doing. Pt understood

## 2021-03-21 NOTE — Telephone Encounter (Signed)
Please document our discussion for reference. I think this is resolved.

## 2021-03-23 ENCOUNTER — Telehealth: Payer: Self-pay

## 2021-03-23 DIAGNOSIS — Z006 Encounter for examination for normal comparison and control in clinical research program: Secondary | ICD-10-CM

## 2021-03-23 NOTE — Telephone Encounter (Signed)
Called patient for 90 day Identify phone call no answer, I left a voicemail stating the intent of the phone call and our call back number to be reached in our department. 

## 2021-03-24 ENCOUNTER — Ambulatory Visit
Admission: RE | Admit: 2021-03-24 | Discharge: 2021-03-24 | Disposition: A | Discharge status: Home / Self Care | Payer: Medicare Other | Source: Ambulatory Visit | Attending: Family Medicine | Admitting: Family Medicine

## 2021-03-24 ENCOUNTER — Other Ambulatory Visit: Payer: Self-pay

## 2021-03-24 DIAGNOSIS — Z1231 Encounter for screening mammogram for malignant neoplasm of breast: Secondary | ICD-10-CM

## 2021-04-02 ENCOUNTER — Other Ambulatory Visit: Payer: Self-pay | Admitting: Cardiology

## 2021-04-06 ENCOUNTER — Ambulatory Visit: Payer: Medicare Other | Admitting: Physician Assistant

## 2021-07-10 ENCOUNTER — Ambulatory Visit: Payer: Medicare Other | Admitting: Cardiology

## 2021-08-26 DIAGNOSIS — H6693 Otitis media, unspecified, bilateral: Secondary | ICD-10-CM | POA: Diagnosis not present

## 2021-08-29 ENCOUNTER — Telehealth: Payer: Self-pay

## 2021-08-29 NOTE — Telephone Encounter (Signed)
--  Caller states she has right ear pain and congestion. States symptoms started 3-4 days ago, had fever last night. Covid test negative.  08/26/2021 2:53:18 PM See HCP within 4 Hours (or PCP triage) Windle Guard, RN, Olin Hauser  Referrals [facility] [place]  08/29/21 1232: Pt states she went to [facility] & was dx with bilat ear infection. Currently taking prescribed abx. Pt advised if no improvement after abx course, to call & schedule in person appt with any available provider. Pt verb understanding & denies further ques/concerns at this time.

## 2021-09-03 DIAGNOSIS — H9311 Tinnitus, right ear: Secondary | ICD-10-CM | POA: Diagnosis not present

## 2021-09-03 DIAGNOSIS — H7391 Unspecified disorder of tympanic membrane, right ear: Secondary | ICD-10-CM | POA: Diagnosis not present

## 2021-09-03 DIAGNOSIS — H6982 Other specified disorders of Eustachian tube, left ear: Secondary | ICD-10-CM | POA: Diagnosis not present

## 2021-09-03 DIAGNOSIS — H93231 Hyperacusis, right ear: Secondary | ICD-10-CM | POA: Diagnosis not present

## 2021-09-12 DIAGNOSIS — H6983 Other specified disorders of Eustachian tube, bilateral: Secondary | ICD-10-CM | POA: Diagnosis not present

## 2021-09-18 DIAGNOSIS — H6981 Other specified disorders of Eustachian tube, right ear: Secondary | ICD-10-CM | POA: Diagnosis not present

## 2021-09-18 DIAGNOSIS — J3489 Other specified disorders of nose and nasal sinuses: Secondary | ICD-10-CM | POA: Diagnosis not present

## 2021-09-18 DIAGNOSIS — H90A31 Mixed conductive and sensorineural hearing loss, unilateral, right ear with restricted hearing on the contralateral side: Secondary | ICD-10-CM | POA: Diagnosis not present

## 2021-09-18 DIAGNOSIS — H90A22 Sensorineural hearing loss, unilateral, left ear, with restricted hearing on the contralateral side: Secondary | ICD-10-CM | POA: Diagnosis not present

## 2021-09-18 DIAGNOSIS — H6531 Chronic mucoid otitis media, right ear: Secondary | ICD-10-CM | POA: Diagnosis not present

## 2021-09-20 ENCOUNTER — Encounter: Payer: Medicare Other | Admitting: Family Medicine

## 2021-09-28 DIAGNOSIS — H6981 Other specified disorders of Eustachian tube, right ear: Secondary | ICD-10-CM | POA: Diagnosis not present

## 2021-09-28 DIAGNOSIS — H6521 Chronic serous otitis media, right ear: Secondary | ICD-10-CM | POA: Diagnosis not present

## 2021-09-28 DIAGNOSIS — H90A31 Mixed conductive and sensorineural hearing loss, unilateral, right ear with restricted hearing on the contralateral side: Secondary | ICD-10-CM | POA: Diagnosis not present

## 2021-10-17 DIAGNOSIS — H90A31 Mixed conductive and sensorineural hearing loss, unilateral, right ear with restricted hearing on the contralateral side: Secondary | ICD-10-CM | POA: Diagnosis not present

## 2021-10-17 DIAGNOSIS — H6981 Other specified disorders of Eustachian tube, right ear: Secondary | ICD-10-CM | POA: Diagnosis not present

## 2021-10-17 DIAGNOSIS — H90A11 Conductive hearing loss, unilateral, right ear with restricted hearing on the contralateral side: Secondary | ICD-10-CM | POA: Diagnosis not present

## 2021-10-17 DIAGNOSIS — H93A1 Pulsatile tinnitus, right ear: Secondary | ICD-10-CM | POA: Diagnosis not present

## 2021-10-20 NOTE — Progress Notes (Signed)
HPI: Cheryl Barnes is a 71 y.o. female, who is here today for AWV and follow up.  Last AWV: 10/17/20 Independent ADL's and IADL's.  She walks a little bit, she is active around her house with chores and yard work. She tried to follow a healthful diet, cooks at home. Meat and vegetables. Trying to eat healthier and still having difficulty losing wt. She snacks on fruit and peanut butter with crackers.  Functional Status Survey: Is the patient deaf or have difficulty hearing?: No Does the patient have difficulty seeing, even when wearing glasses/contacts?: No Does the patient have difficulty concentrating, remembering, or making decisions?: No Does the patient have difficulty walking or climbing stairs?: No Does the patient have difficulty dressing or bathing?: No Does the patient have difficulty doing errands alone such as visiting a doctor's office or shopping?: No  Fall Risk  10/23/2021 10/17/2020 10/09/2019 10/04/2018 09/24/2017  Falls in the past year? 0 0 0 0 No  Number falls in past yr: 0 0 0 0 -  Injury with Fall? 0 0 0 0 -  Risk for fall due to : No Fall Risks - - - -  Follow up Falls evaluation completed;Education provided Education provided Education provided Education provided -   Providers she sees regularly: ENT, Jolene Provost, prn. Eye care provider: Dr Wynetta Emery Dermatology: Ms Winston, [place] [facility]: Dr Theda Sers, prn. Cardiologist, Dr Terri Skains.  Depression screen [facility] 2/9 10/23/2021  Decreased Interest 0  Down, Depressed, Hopeless 0  PHQ - 2 Score 0    Mini-Cog - 10/23/21 1005     Normal clock drawing test? yes    How many words correct? 3            Vision Screening - Comments:: Has upcoming eye exam with eye care provider.  Chronic medical problems: HTN,HLD,LBBB,diverticulosis, aortic atherosclerosis,and thyroid nodule among some.  Immunization History  Administered Date(s) Administered   Fluad Quad(high Dose 65+) 05/08/2019   Influenza Whole  05/27/2009   Influenza, High Dose Seasonal PF 05/08/2017   Influenza,inj,Quad PF,6+ Mos 05/13/2014   Influenza-Unspecified 05/13/2013, 05/12/2014, 05/12/2015, 04/27/2018   Pneumococcal Conjugate-13 12/18/2016   Pneumococcal Polysaccharide-23 12/24/2017   Td 08/27/1997, 03/09/2008   Zoster, Live 10/06/2013   Health Maintenance  Topic Date Due   COVID-19 Vaccine (1) 11/08/2021 (Originally 05/25/1951)   INFLUENZA VACCINE  11/24/2021 (Originally 03/27/2021)   Zoster Vaccines- Shingrix (1 of 2) 08/31/2022 (Originally 11/22/2001)   MAMMOGRAM  03/25/2023   COLONOSCOPY (Pts 45-98yr Insurance coverage will need to be confirmed)  10/30/2023   Pneumonia Vaccine 70 Years old  Completed   DEXA SCAN  Completed   Hepatitis C Screening  Completed   HPV VACCINES  Aged Out   TETANUS/TDAP  Discontinued   Thyroid nodule. TSH has been normal. Thyroid [facility] 01/2017 showed small nodules and a simple cyst did not meet criteria for bx of follow up. She has not noted growth.  Lab Results  Component Value Date   TSH 0.51 10/17/2020   HTN: She is on Amlodipine 5 mg daily. Negative for severe/frequent headache, visual changes, chest pain, dyspnea, palpitation,focal weakness, or edema. She checks BP sometimes.  Lab Results  Component Value Date   CREATININE 0.68 11/25/2020   BUN 17 11/25/2020   NA 142 11/25/2020   K 4.2 11/25/2020   CL 105 11/25/2020   CO2 21 11/25/2020   Since her last visit she has seen ENT for earache and right pulsatile tinnitus. About 3 weeks ago she underwent right  sided tympanostomy and tube placement, which helped with symptoms. Last follow up 10/17/21, she was recommended to follow prn.  HLD: She also saw cardiologist, Pravastatin was recommended but it caused depression and body aches, so she discontinued. Aortic atherosclerosis seen on coronary CT in 11/2020.  Lab Results  Component Value Date   CHOL 178 10/17/2020   HDL 53.50 10/17/2020   LDLCALC 96 10/17/2020    LDLDIRECT 120.6 08/22/2011   TRIG 140.0 10/17/2020   CHOLHDL 3 10/17/2020   Review of Systems  Constitutional:  Negative for activity change, appetite change and fever.  HENT:  Negative for mouth sores, nosebleeds and sore throat.   Respiratory:  Negative for cough and wheezing.   Gastrointestinal:  Negative for abdominal pain, nausea and vomiting.       Negative for changes in bowel habits.  Endocrine: Negative for cold intolerance and heat intolerance.  Genitourinary:  Negative for decreased urine volume and hematuria.  Musculoskeletal:  Negative for gait problem and myalgias.  Neurological:  Negative for syncope, facial asymmetry and weakness.  Psychiatric/Behavioral:  Negative for behavioral problems and confusion.    Current Outpatient Medications on File Prior to Visit  Medication Sig Dispense Refill   aspirin EC 81 MG tablet Take 1 tablet (81 mg total) by mouth daily. Swallow whole. 90 tablet 3   Calcium Citrate-Vitamin D (CALCIUM + D PO) Take 600 mg by mouth 2 (two) times daily.     cetirizine (ZYRTEC) 10 MG tablet Take 10 mg by mouth daily.     valACYclovir (VALTREX) 1000 MG tablet Take 2 tablets by mouth as needed upon acute onset and asap repeat dose in 12 hours 16 tablet 5   No current facility-administered medications on file prior to visit.   Past Medical History:  Diagnosis Date   Allergy    Diverticulitis    Hypertension    LBBB (left bundle branch block)    Migraine    history   Nonobstructive atherosclerosis of coronary artery    Past Surgical History:  Procedure Laterality Date   CESAREAN SECTION     CHOLECYSTECTOMY     FRACTURE SURGERY     PARTIAL HYSTERECTOMY     Abdominal   ROTATOR CUFF REPAIR Left    ROTATOR CUFF REPAIR Right     Allergies  Allergen Reactions   Penicillins Itching and Rash    Has patient had a PCN reaction causing immediate rash, facial/tongue/throat swelling, SOB or lightheadedness with hypotension: Yes Has patient had a PCN  reaction causing severe rash involving mucus membranes or skin necrosis: No Has patient had a PCN reaction that required hospitalization No Has patient had a PCN reaction occurring within the last 10 years: No If all of the above answers are "NO", then may proceed with Cephalosporin use.    Seasonal Ic [Cholestatin] Other (See Comments)    Upper resp, sore throat, sneezing, etc.   Tape Other (See Comments)    Tape causes some redness & causes BLISTERS if left on for too long   Latex Itching and Rash    Family History  Problem Relation Age of Onset   Hypertension Father    Diabetes Father        adult onset   Colon cancer Father    Hypertension Mother    Breast cancer Mother        breast   Cancer Mother        breast   Asthma Maternal Grandfather    Hypertension Brother  Diabetes Brother    Stomach cancer Neg Hx    Pancreatic cancer Neg Hx     Social History   Socioeconomic History   Marital status: Single    Spouse name: Not on file   Number of children: Not on file   Years of education: Not on file   Highest education level: Not on file  Occupational History   Occupation: ophthalmology @ [employer]    Employer: [employer]  Tobacco Use   Smoking status: Never   Smokeless tobacco: Never  Vaping Use   Vaping Use: Never used  Substance and Sexual Activity   Alcohol use: No   Drug use: No   Sexual activity: Never  Other Topics Concern   Not on file  Social History Narrative   Not on file   Social Determinants of Health   Financial Resource Strain: Not on file  Food Insecurity: Not on file  Transportation Needs: Not on file  Physical Activity: Not on file  Stress: Not on file  Social Connections: Not on file   Vitals:   10/23/21 0949  BP: 128/70  Pulse: 79  Resp: 16  SpO2: 98%   Body mass index is 30.61 kg/m.  Wt Readings from Last 3 Encounters:  10/23/21 162 lb (73.5 kg)  01/04/21 164 lb 12.8 oz (74.8 kg)  11/18/20 168 lb (76.2 kg)    Physical Exam Vitals and nursing note reviewed.  Constitutional:      General: She is not in acute distress.    Appearance: She is well-developed.  HENT:     Head: Normocephalic and atraumatic.     Right Ear: External ear normal.     Left Ear: Tympanic membrane, ear canal and external ear normal.     Ears:     Comments: Right tube in place and trace of blood in ear canal, no active bleeding.    Mouth/Throat:     Mouth: Mucous membranes are moist.     Pharynx: Oropharynx is clear.  Eyes:     Conjunctiva/sclera: Conjunctivae normal.  Neck:     Thyroid: Thyromegaly present.  Cardiovascular:     Rate and Rhythm: Normal rate and regular rhythm.     Pulses:          Dorsalis pedis pulses are 2+ on the right side and 2+ on the left side.     Heart sounds: No murmur heard. Pulmonary:     Effort: Pulmonary effort is normal. No respiratory distress.     Breath sounds: Normal breath sounds.  Abdominal:     Palpations: Abdomen is soft. There is no hepatomegaly or mass.     Tenderness: There is no abdominal tenderness.  Lymphadenopathy:     Cervical: No cervical adenopathy.  Skin:    General: Skin is warm.     Findings: No erythema or rash.  Neurological:     General: No focal deficit present.     Mental Status: She is alert and oriented to person, place, and time.     Cranial Nerves: No cranial nerve deficit.     Gait: Gait normal.  Psychiatric:     Comments: Well groomed, good eye contact.   ASSESSMENT AND PLAN:  Ms. Lamari Youngers was here today annual physical examination.  Orders Placed This Encounter  Procedures   Comprehensive metabolic panel   Lipid panel   TSH   Ambulatory referral to Gastroenterology   Lab Results  Component Value Date   TSH 1.04 10/23/2021  Lab Results  Component Value Date   CHOL 188 10/23/2021   HDL 51.40 10/23/2021   LDLCALC 101 (H) 10/23/2021   LDLDIRECT 120.6 08/22/2011   TRIG 178.0 (H) 10/23/2021   CHOLHDL 4 10/23/2021    Lab Results  Component Value Date   CREATININE 0.75 10/23/2021   BUN 13 10/23/2021   NA 141 10/23/2021   K 3.7 10/23/2021   CL 108 10/23/2021   CO2 28 10/23/2021   Lab Results  Component Value Date   ALT 27 10/23/2021   AST 21 10/23/2021   ALKPHOS 85 10/23/2021   BILITOT 0.5 10/23/2021   Medicare annual wellness visit, subsequent We discussed the importance of staying active, physically and mentally, as well as the benefits of a healthy/balance diet. Low impact exercise that involve stretching and strengthing are ideal. Vaccines up to date. We discussed preventive screening for the next 5-10 years, summery of recommendations given in AVS.   Goals      Exercise 3x per week (30 min per time)     Tai chi and/or walking 10-15 min. Stop peanut butter crackers. 1-2 fruits daily and 2-3 portions of green vegetables.        This is a list of the screening recommended for you and due dates:  Health Maintenance  Topic Date Due   COVID-19 Vaccine (1) 11/08/2021*   Flu Shot  11/24/2021*   Zoster (Shingles) Vaccine (1 of 2) 08/31/2022*   Mammogram  03/25/2023   Colon Cancer Screening  10/30/2023   Pneumonia Vaccine  Completed   DEXA scan (bone density measurement)  Completed   Hepatitis C Screening: USPSTF Recommendation to screen - Ages 23-79 yo.  Completed   HPV Vaccine  Aged Out   Tetanus Vaccine  Discontinued  *Topic was postponed. The date shown is not the original due date.   Fall prevention. Advance directives and end of life discussed, she has POA and living will.   Essential hypertension BP adequately controlled. Continue current management: Amlodipine 5 mg daily. DASH/low salt diet to continue. Monitor BP at home. Eye exam recommended annually.  -     amLODipine (NORVASC) 5 MG tablet; Take 1 tablet (5 mg total) by mouth daily.  Atherosclerosis of aorta (HCC) CV benefits of statin discussed. Further recommendations according to lipid panel  results. Continue Aspirin 81 mg daily, some side effects discussed.  Thyroid nodule TSH has been stable. Further recommendations according to TSH result.  Pure hypercholesterolemia Non pharmacologic treatment recommended for now. Further recommendations will be given according to lipid panel numbers.  The 10-year ASCVD risk score (Arnett DK, et al., 2019) is: 11.3%   Values used to calculate the score:     Age: 16 years     Sex: Female     Is Non-Hispanic African American: No     Diabetic: No     Tobacco smoker: No     Systolic Blood Pressure: 496 mmHg     Is BP treated: Yes     HDL Cholesterol: 51.4 mg/dL     Total Cholesterol: 188 mg/dL  Obesity (BMI 30-39.9) We discussed benefits of wt loss. Consistency with healthy diet and physical activity encouraged. Recommend healthier snacks.  Colon cancer screening -     Ambulatory referral to Gastroenterology  Return in about 1 year (around 10/23/2022) for AWV and f/u.  Marijose Curington G. [name], MD  [facility]. [place] office.

## 2021-10-23 ENCOUNTER — Encounter: Payer: Self-pay | Admitting: Family Medicine

## 2021-10-23 ENCOUNTER — Ambulatory Visit (INDEPENDENT_AMBULATORY_CARE_PROVIDER_SITE_OTHER): Payer: Medicare Other | Admitting: Family Medicine

## 2021-10-23 VITALS — BP 128/70 | HR 79 | Resp 16 | Ht 61.0 in | Wt 162.0 lb

## 2021-10-23 DIAGNOSIS — E669 Obesity, unspecified: Secondary | ICD-10-CM | POA: Diagnosis not present

## 2021-10-23 DIAGNOSIS — I700 Atherosclerosis of aorta: Secondary | ICD-10-CM

## 2021-10-23 DIAGNOSIS — E041 Nontoxic single thyroid nodule: Secondary | ICD-10-CM | POA: Diagnosis not present

## 2021-10-23 DIAGNOSIS — Z1211 Encounter for screening for malignant neoplasm of colon: Secondary | ICD-10-CM

## 2021-10-23 DIAGNOSIS — E7800 Pure hypercholesterolemia, unspecified: Secondary | ICD-10-CM

## 2021-10-23 DIAGNOSIS — Z0000 Encounter for general adult medical examination without abnormal findings: Secondary | ICD-10-CM | POA: Diagnosis not present

## 2021-10-23 DIAGNOSIS — I10 Essential (primary) hypertension: Secondary | ICD-10-CM | POA: Diagnosis not present

## 2021-10-23 LAB — COMPREHENSIVE METABOLIC PANEL
ALT: 27 U/L (ref 0–35)
AST: 21 U/L (ref 0–37)
Albumin: 4.5 g/dL (ref 3.5–5.2)
Alkaline Phosphatase: 85 U/L (ref 39–117)
BUN: 13 mg/dL (ref 6–23)
CO2: 28 mEq/L (ref 19–32)
Calcium: 9.6 mg/dL (ref 8.4–10.5)
Chloride: 108 mEq/L (ref 96–112)
Creatinine, Ser: 0.75 mg/dL (ref 0.40–1.20)
GFR: 80.95 mL/min (ref >60.00)
Glucose, Bld: 96 mg/dL (ref 70–99)
Potassium: 3.7 mEq/L (ref 3.5–5.1)
Sodium: 141 mEq/L (ref 135–145)
Total Bilirubin: 0.5 mg/dL (ref 0.2–1.2)
Total Protein: 7.2 g/dL (ref 6.0–8.3)

## 2021-10-23 LAB — LIPID PANEL
Cholesterol: 188 mg/dL (ref 0–200)
HDL: 51.4 mg/dL (ref >39.00)
LDL Cholesterol: 101 mg/dL — ABNORMAL HIGH (ref 0–99)
NonHDL: 136.4
Total CHOL/HDL Ratio: 4
Triglycerides: 178 mg/dL — ABNORMAL HIGH (ref 0.0–149.0)
VLDL: 35.6 mg/dL (ref 0.0–40.0)

## 2021-10-23 NOTE — Patient Instructions (Addendum)
Ms. Cobert , Thank you for taking time to come for your Medicare Wellness Visit. I appreciate your ongoing commitment to your health goals. Please review the following plan we discussed and let me know if I can assist you in the future.   These are the goals we discussed:  Goals      Exercise 3x per week (30 min per time)     Tai chi and/or walking 10-15 min. Stop peanut butter crackers. 1-2 fruits daily and 2-3 portions of green vegetables.        This is a list of the screening recommended for you and due dates:  Health Maintenance  Topic Date Due   COVID-19 Vaccine (1) 11/08/2021*   Flu Shot  11/24/2021*   Zoster (Shingles) Vaccine (1 of 2) 08/31/2022*   Mammogram  03/25/2023   Colon Cancer Screening  10/30/2023   Pneumonia Vaccine  Completed   DEXA scan (bone density measurement)  Completed   Hepatitis C Screening: USPSTF Recommendation to screen - Ages 27-79 yo.  Completed   HPV Vaccine  Aged Out   Tetanus Vaccine  Discontinued  *Topic was postponed. The date shown is not the original due date.   A few things to remember from today's visit:   Medicare annual wellness visit, subsequent  Essential hypertension - Plan: Comprehensive metabolic panel  Atherosclerosis of aorta (Major) - Plan: Lipid panel  Thyroid nodule - Plan: TSH  Pure hypercholesterolemia - Plan: Comprehensive metabolic panel, Lipid panel  Colon cancer screening - Plan: Ambulatory referral to Gastroenterology  If you need refills please call your pharmacy. Do not use My Chart to request refills or for acute issues that need immediate attention.  Please be sure medication list is accurate. If a new problem present, please set up appointment sooner than planned today.  A few tips:  -As we age balance is not as good as it was, so there is a higher risks for falls. Please remove small rugs and furniture that is "in your way" and could increase the risk of falls. Stretching exercises may help with fall  prevention: Yoga and Tai Chi are some examples. Low impact exercise is better, so you are not very achy the next day.  -Sun screen and avoidance of direct sun light recommended. Caution with dehydration, if working outdoors be sure to drink enough fluids.  - Some medications are not safe as we age, increases the risk of side effects and can potentially interact with other medication you are also taken;  including some of over the counter medications. Be sure to let me know when you start a new medication even if it is a dietary/vitamin supplement.   -Healthy diet low in red meet/animal fat and sugar + regular physical activity is recommended.

## 2021-10-24 LAB — TSH: TSH: 1.04 u[IU]/mL (ref 0.35–5.50)

## 2021-10-25 MED ORDER — AMLODIPINE BESYLATE 5 MG PO TABS: 5.0000 mg | ORAL_TABLET | Freq: Every day | ORAL | 3 refills | Status: DC

## 2021-11-02 DIAGNOSIS — H2513 Age-related nuclear cataract, bilateral: Secondary | ICD-10-CM | POA: Diagnosis not present

## 2021-11-10 ENCOUNTER — Telehealth: Payer: Self-pay | Admitting: Family Medicine

## 2021-11-10 NOTE — Telephone Encounter (Signed)
I spoke with [facility], per her last colonoscopy report & Dr. Ardis Hughs, colonoscopy doesn't need to be repeated until 2025. Patient is aware. ?FYI ?

## 2021-11-10 NOTE — Telephone Encounter (Signed)
Patient called in stating that when she called to schedule her colonoscopy she was informed that she wasn't due until 2025. ? ?Please advise. ?

## 2021-12-19 ENCOUNTER — Telehealth: Payer: Self-pay | Admitting: Family Medicine

## 2021-12-19 NOTE — Telephone Encounter (Signed)
Yes, that is correct I do not recommend wt loss medication but we can offer a referral to wt loss clinic. ?Thanks, ?BJ ?

## 2021-12-19 NOTE — Telephone Encounter (Signed)
Patient called in to see if Provider will subscribe a weight loss pill.  Patient would like to have CMA or Provider to return call  ?

## 2021-12-19 NOTE — Telephone Encounter (Signed)
I left patient a voicemail letting her know that PCP does not prescribe weight loss medications. ?

## 2021-12-19 NOTE — Telephone Encounter (Signed)
I spoke with patient. You have prescribed her phentermine in the past, she wanted to know if it would be possible to restart for a short period? I told her you weren't prescribing new prescriptions, but that I would check since you had prescribed it before. I can set her an appointment up if you want to discuss it with her.  ?

## 2021-12-20 NOTE — Telephone Encounter (Signed)
I called and spoke with patient. We went over the information below. She will consider the referral and let me know if she would like to move forward.  ?

## 2022-01-04 ENCOUNTER — Ambulatory Visit (INDEPENDENT_AMBULATORY_CARE_PROVIDER_SITE_OTHER): Payer: Medicare Other | Admitting: Physician Assistant

## 2022-01-04 ENCOUNTER — Encounter: Payer: Self-pay | Admitting: Physician Assistant

## 2022-01-04 DIAGNOSIS — L820 Inflamed seborrheic keratosis: Secondary | ICD-10-CM | POA: Diagnosis not present

## 2022-01-04 DIAGNOSIS — Z1283 Encounter for screening for malignant neoplasm of skin: Secondary | ICD-10-CM

## 2022-01-04 NOTE — Progress Notes (Signed)
? ?  Follow-Up Visit ?  ?Subjective  ?Cheryl Barnes is a 71 y.o. female who presents for the following: Annual Exam (No new concerns. No personal or family history of non mole skin cancer or melanoma. ). ? ? ?The following portions of the chart were reviewed this encounter and updated as appropriate:  Tobacco  Allergies  Meds  Problems  Med Hx  Surg Hx  Fam Hx   ?  ? ?Objective  ?Well appearing patient in no apparent distress; mood and affect are within normal limits. ? ?A full examination was performed including scalp, head, eyes, ears, nose, lips, neck, chest, axillae, abdomen, back, buttocks, bilateral upper extremities, bilateral lower extremities, hands, feet, fingers, toes, fingernails, and toenails. All findings within normal limits unless otherwise noted below. ? ?Full body skin examination- No atypical nevi or signs of NMSC noted at the time of the visit.  ? ?Left Upper Arm - Anterior, Mid Abdomen, Mid Back (10) ?Stuck-on, crusted plaques on an erythematous base.  ? ? ?Assessment & Plan  ?Encounter for screening for malignant neoplasm of skin ? ?Yearly skin examination  ? ?Seborrheic keratosis, inflamed (12) ?Left Upper Arm - Anterior; Mid Abdomen; Mid Back (10) ? ?Destruction of lesion - Left Upper Arm - Anterior, Mid Abdomen, Mid Back ?Complexity: simple   ?Destruction method: cryotherapy   ?Informed consent: discussed and consent obtained   ?Timeout:  patient name, date of birth, surgical site, and procedure verified ?Lesion destroyed using liquid nitrogen: Yes   ?Outcome: patient tolerated procedure well with no complications   ? ? ? ?I, Suede Greenawalt, PA-C, have reviewed all documentation's for this visit.  The documentation on 01/04/22 for the exam, diagnosis, procedures and orders are all accurate and complete. ?

## 2022-02-01 ENCOUNTER — Telehealth: Payer: Self-pay | Admitting: Physician Assistant

## 2022-02-01 NOTE — Telephone Encounter (Signed)
Informed patient that we would have to see daughter to diagnosis the lesions and determine how Cheryl Barnes would want to removed then.Marland Kitchen a biobpy, punch bx or excision. They are all different prices so once we know how she would removed them we would be able to get her a more accurate price. Patient understood and will have daughter call back to make appointment if she wants them removed.

## 2022-02-01 NOTE — Telephone Encounter (Signed)
Wants an idea how much removing  2 papillomas would cost; one at end of eyebrow, the other on cheek. (She's trying to convince daughter, in [place], to have it done in [place]). She asked for Lauren or KRS.

## 2022-02-09 DIAGNOSIS — M25522 Pain in left elbow: Secondary | ICD-10-CM | POA: Diagnosis not present

## 2022-03-09 ENCOUNTER — Telehealth: Payer: Self-pay | Admitting: Family Medicine

## 2022-03-09 DIAGNOSIS — H6981 Other specified disorders of Eustachian tube, right ear: Secondary | ICD-10-CM

## 2022-03-09 NOTE — Telephone Encounter (Signed)
Referral placed for patient.  

## 2022-03-09 NOTE — Telephone Encounter (Signed)
Pt requesting a referral to Dr Thornell Mule [phone]

## 2022-03-19 ENCOUNTER — Other Ambulatory Visit: Payer: Self-pay | Admitting: Cardiology

## 2022-05-07 ENCOUNTER — Telehealth: Payer: Self-pay | Admitting: Family Medicine

## 2022-05-07 NOTE — Telephone Encounter (Signed)
Requesting refill losartan (COZAAR) 50 MG tablet 90d supply sent to  [place], [place] Phone:  [phone]  Fax:  [phone]    States she is out

## 2022-05-07 NOTE — Telephone Encounter (Signed)
I don't see medication on her med list, it looks like you discontinued it back in March.

## 2022-05-07 NOTE — Telephone Encounter (Signed)
According to records, she is just on Amlodipine 5 mg daily. I think I d/c med because she was not taking it. She was supposed to see cardiologist in 10/2021. Is she taking Losartan also? If she is and BP is well controlled, she can continue it. Thanks, BJ

## 2022-05-08 MED ORDER — LOSARTAN POTASSIUM 50 MG PO TABS: 50.0000 mg | ORAL_TABLET | Freq: Every day | ORAL | 3 refills | Status: DC

## 2022-05-08 NOTE — Addendum Note (Signed)
Addended by: Nathanial Millman E on: 05/08/2022 11:52 AM   Modules accepted: Orders

## 2022-05-08 NOTE — Telephone Encounter (Signed)
I spoke with patient, she has been taking medication and her BP has been well controlled. Rx sent in.

## 2022-08-23 DIAGNOSIS — H903 Sensorineural hearing loss, bilateral: Secondary | ICD-10-CM | POA: Diagnosis not present

## 2022-08-23 DIAGNOSIS — Z9622 Myringotomy tube(s) status: Secondary | ICD-10-CM | POA: Diagnosis not present

## 2022-08-23 DIAGNOSIS — H93A1 Pulsatile tinnitus, right ear: Secondary | ICD-10-CM | POA: Diagnosis not present

## 2022-10-23 NOTE — Progress Notes (Signed)
HPI: Cheryl Barnes is a 72 y.o. female with PMHx significant for HTN,HLD,LBBB,diverticulosis, aortic atherosclerosis,and thyroid nodule here today for her routine physical.  Last CPE: 10/23/21 No new problems since her last visit.  Independent ADL's and IADL's.  She reports trying to maintain a healthy diet, cooking at home, and consuming vegetables daily. She has not been engaged in regular physical activity, but has been performing household chores and plans to walk outside when the weather improves.  She denies any falls in the past year.  She sleeps an average of 8-9 hours per night, does not smoke, and does not consume significant amounts of alcohol.  Functional Status Survey: Is the patient deaf or have difficulty hearing?: No Does the patient have difficulty seeing, even when wearing glasses/contacts?: No Does the patient have difficulty concentrating, remembering, or making decisions?: No Does the patient have difficulty walking or climbing stairs?: No Does the patient have difficulty dressing or bathing?: No Does the patient have difficulty doing errands alone such as visiting a doctor's office or shopping?: No     10/24/2022    8:00 AM 10/23/2021    9:54 AM 10/17/2020    9:01 AM 10/09/2019   10:17 AM 10/04/2018   11:14 PM  [place] in the past year? 0 0 0 0 0  Number falls in past yr: 0 0 0 0 0  Injury with Fall? 0 0 0 0 0  Risk for fall due to : Other (Comment) No Fall Risks     Follow up Falls evaluation completed Falls evaluation completed;Education provided Education provided Education provided Education provided   She mentions her last eye exam was about a year ago and has not experienced any issues with her eyes/vision.  She has not seen an orthopedist recently, but recalls seeing one about a year ago for left elbow pain, which has since resolved.  LBBB: States that she has been released by her cardiologist, she is to follow prn.  Providers she  sees regularly: Dermatologist, Evans Lance, [place] Eye care provider: Dr Wynetta Emery  ENT, Jolene Provost PA Ortho: Dr Theda Sers, prn. Cardiologist, Dr Terri Skains, prn.     10/24/2022    8:01 AM  Depression screen PHQ 2/9  Decreased Interest 1  Down, Depressed, Hopeless 1  PHQ - 2 Score 2  Altered sleeping 1  Tired, decreased energy 1  Change in appetite 1  Feeling bad or failure about yourself  1  Trouble concentrating 1  Moving slowly or fidgety/restless 1  Suicidal thoughts 0  PHQ-9 Score 8  Difficult doing work/chores Not difficult at all   Vision Screening   Right eye Left eye Both eyes  Without correction '20/20 20/20 20/20 '$  With correction       Mini-Cog - 10/24/22 0814     Normal clock drawing test? yes    How many words correct? 3            Immunization History  Administered Date(s) Administered   Fluad Quad(high Dose 65+) 05/08/2019   Influenza Whole 05/27/2009   Influenza, High Dose Seasonal PF 05/08/2017   Influenza,inj,Quad PF,6+ Mos 05/13/2014   Influenza-Unspecified 05/13/2013, 05/12/2014, 05/12/2015, 04/27/2018   Pneumococcal Conjugate-13 12/18/2016   Pneumococcal Polysaccharide-23 12/24/2017   Td 08/27/1997, 03/09/2008   Zoster, Live 10/06/2013   Health Maintenance  Topic Date Due   COVID-19 Vaccine (1) 11/08/2022 (Originally 11/22/1955)   INFLUENZA VACCINE  11/25/2022 (Originally 03/27/2022)   Zoster Vaccines- Shingrix (1 of 2) 08/30/2023 (  Originally 11/23/1969)   MAMMOGRAM  03/25/2023   Medicare Annual Wellness (AWV)  10/25/2023   COLONOSCOPY (Pts 45-32yr Insurance coverage will need to be confirmed)  10/30/2023   Pneumonia Vaccine 72 Years old  Completed   DEXA SCAN  Completed   Hepatitis C Screening  Completed   HPV VACCINES  Aged Out   DTaP/Tdap/Td  Discontinued   Hypertension:  Medications:Amlodipine 5 mg daily and Losartan 50 mg daily  BP readings at home:Not frequently. Side effects:None. Negative for unusual or severe headache, visual  changes, exertional chest pain, dyspnea,  focal weakness, or edema.  Lab Results  Component Value Date   CREATININE 0.75 10/23/2021   BUN 13 10/23/2021   NA 141 10/23/2021   K 3.7 10/23/2021   CL 108 10/23/2021   CO2 28 10/23/2021   HLD on non pharmacologic treatment. Aortic atherosclerosis seen on coronary CT in 11/2020.. Lab Results  Component Value Date   CHOL 188 10/23/2021   HDL 51.40 10/23/2021   LDLCALC 101 (H) 10/23/2021   LDLDIRECT 120.6 08/22/2011   TRIG 178.0 (H) 10/23/2021   CHOLHDL 4 10/23/2021   Thyroid nodule. She has not noted growth. Lab Results  Component Value Date   TSH 1.04 10/23/2021   Review of Systems  Constitutional:  Negative for activity change, appetite change and fever.  HENT:  Negative for mouth sores, nosebleeds and sore throat.   Respiratory:  Negative for cough and wheezing.   Gastrointestinal:  Negative for abdominal pain, nausea and vomiting.       Negative for changes in bowel habits.  Endocrine: Negative for cold intolerance, heat intolerance, polydipsia, polyphagia and polyuria.  Genitourinary:  Negative for decreased urine volume, dysuria and hematuria.  Musculoskeletal:  Negative for back pain and myalgias.  Skin:  Negative for rash.  Neurological:  Negative for syncope and facial asymmetry.  Psychiatric/Behavioral:  Negative for confusion and hallucinations.    Current Outpatient Medications on File Prior to Visit  Medication Sig Dispense Refill   amLODipine (NORVASC) 5 MG tablet Take 1 tablet (5 mg total) by mouth daily. 90 tablet 3   Calcium Citrate-Vitamin D (CALCIUM + D PO) Take 600 mg by mouth 2 (two) times daily.     cetirizine (ZYRTEC) 10 MG tablet Take 10 mg by mouth daily.     losartan (COZAAR) 50 MG tablet Take 1 tablet (50 mg total) by mouth daily. 90 tablet 3   valACYclovir (VALTREX) 1000 MG tablet Take 2 tablets by mouth as needed upon acute onset and asap repeat dose in 12 hours 16 tablet 5   No current  facility-administered medications on file prior to visit.    Past Medical History:  Diagnosis Date   Allergy    Diverticulitis    Hypertension    LBBB (left bundle branch block)    Migraine    history   Nonobstructive atherosclerosis of coronary artery     Past Surgical History:  Procedure Laterality Date   CESAREAN SECTION     CHOLECYSTECTOMY     FRACTURE SURGERY     PARTIAL HYSTERECTOMY     Abdominal   ROTATOR CUFF REPAIR Left    ROTATOR CUFF REPAIR Right     Allergies  Allergen Reactions   Penicillins Itching and Rash    Has patient had a PCN reaction causing immediate rash, facial/tongue/throat swelling, SOB or lightheadedness with hypotension: Yes Has patient had a PCN reaction causing severe rash involving mucus membranes or skin necrosis: No Has patient had  a PCN reaction that required hospitalization No Has patient had a PCN reaction occurring within the last 10 years: No If all of the above answers are "NO", then may proceed with Cephalosporin use.    Seasonal Ic [Cholestatin] Other (See Comments)    Upper resp, sore throat, sneezing, etc.   Tape Other (See Comments)    Tape causes some redness & causes BLISTERS if left on for too long   Latex Itching and Rash    Family History  Problem Relation Age of Onset   Hypertension Father    Diabetes Father        adult onset   Colon cancer Father    Hypertension Mother    Breast cancer Mother        breast   Cancer Mother        breast   Asthma Maternal Grandfather    Hypertension Brother    Diabetes Brother    Stomach cancer Neg Hx    Pancreatic cancer Neg Hx     Social History   Socioeconomic History   Marital status: Single    Spouse name: Not on file   Number of children: Not on file   Years of education: Not on file   Highest education level: Not on file  Occupational History   Occupation: ophthalmology @ [employer]    Employer: [employer]  Tobacco Use   Smoking status: Never   Smokeless  tobacco: Never  Vaping Use   Vaping Use: Never used  Substance and Sexual Activity   Alcohol use: No   Drug use: No   Sexual activity: Never  Other Topics Concern   Not on file  Social History Narrative   Not on file   Social Determinants of Health   Financial Resource Strain: Not on file  Food Insecurity: Not on file  Transportation Needs: Not on file  Physical Activity: Not on file  Stress: Not on file  Social Connections: Not on file   Vitals:   10/24/22 0750  BP: 120/70  Pulse: 65  Resp: 16  Temp: 98.6 F (37 C)  SpO2: 96%   Body mass index is 31.22 kg/m.  Wt Readings from Last 3 Encounters:  10/24/22 165 lb 4 oz (75 kg)  10/23/21 162 lb (73.5 kg)  01/04/21 164 lb 12.8 oz (74.8 kg)   Physical Exam Vitals and nursing note reviewed.  Constitutional:      General: She is not in acute distress.    Appearance: She is well-developed.  HENT:     Head: Normocephalic and atraumatic.     Mouth/Throat:     Mouth: Mucous membranes are moist.     Pharynx: Oropharynx is clear.  Eyes:     Conjunctiva/sclera: Conjunctivae normal.  Neck:     Thyroid: Thyromegaly present.  Cardiovascular:     Rate and Rhythm: Normal rate and regular rhythm.     Pulses:          Dorsalis pedis pulses are 2+ on the right side and 2+ on the left side.     Heart sounds: No murmur heard. Pulmonary:     Effort: Pulmonary effort is normal. No respiratory distress.     Breath sounds: Normal breath sounds.  Abdominal:     Palpations: Abdomen is soft. There is no hepatomegaly or mass.     Tenderness: There is no abdominal tenderness.  Lymphadenopathy:     Cervical: No cervical adenopathy.  Skin:    General: Skin is  warm.     Findings: No erythema or rash.  Neurological:     Mental Status: She is alert and oriented to person, place, and time.     Cranial Nerves: No cranial nerve deficit.     Gait: Gait normal.     Deep Tendon Reflexes:     Reflex Scores:      Patellar reflexes are 2+  on the right side and 2+ on the left side. Psychiatric:        Mood and Affect: Mood and affect normal.   ASSESSMENT AND PLAN: Ms. Jamal Antrobus was here today annual physical examination.  Orders Placed This Encounter  Procedures   Comprehensive metabolic panel   TSH   Lab Results  Component Value Date   TSH 0.58 10/24/2022   Lab Results  Component Value Date   CREATININE 0.75 10/24/2022   BUN 18 10/24/2022   NA 141 10/24/2022   K 3.8 10/24/2022   CL 108 10/24/2022   CO2 25 10/24/2022   Lab Results  Component Value Date   ALT 23 10/24/2022   AST 20 10/24/2022   ALKPHOS 97 10/24/2022   BILITOT 0.6 10/24/2022   Medicare annual wellness visit, subsequent Assessment & Plan: We discussed the importance of staying active, physically and mentally, as well as the benefits of a healthy/balance diet. Low impact exercise that involve stretching and strengthing are ideal. Vaccines up to date. We discussed preventive screening for the next 5-10 years, summery of recommendations given in AVS.   Goals      Exercise 3x per week (30 min per time)     Tai chi and/or walking videos for 15-30 min. 1-2 fruits daily and 2-3 portions of green vegetables.       Health Maintenance  Topic Date Due   COVID-19 Vaccine (1) 11/08/2022*   Flu Shot  11/25/2022*   Zoster (Shingles) Vaccine (1 of 2) 08/30/2023*   Mammogram  03/25/2023   Medicare Annual Wellness Visit  10/25/2023   Colon Cancer Screening  10/30/2023   Pneumonia Vaccine  Completed   DEXA scan (bone density measurement)  Completed   Hepatitis C Screening: USPSTF Recommendation to screen - Ages 61-79 yo.  Completed   HPV Vaccine  Aged Out   DTaP/Tdap/Td vaccine  Discontinued  *Topic was postponed. The date shown is not the original due date.   Fall prevention. Advance directives and end of life discussed, she has POA and living will.   Essential hypertension Assessment & Plan: BP adequately  controlled. Recommend monitoring BP at home. Continue Amlodipine 5 mg daily and Losartan 50 mg daily. Low salt diet. Annual eye exam. She would like annual follow ups.  Orders: -     Comprehensive metabolic panel; Future  Pure hypercholesterolemia Assessment & Plan: She has not tolerated statins in the past: Pravastatin, Atorvastatin,and Lovastatin among some. Continue low fat diet. She is not interested in trying medications, so we decided not to check FLP today.  Orders: -     Comprehensive metabolic panel; Future  Atherosclerosis of aorta [facility]) Assessment & Plan: She is not on statin medication, has tried a few and developed muscle aches.   Statin myopathy Assessment & Plan: She experienced side effects with previous statins: Pravastatin, Atorvastatin (Lipitor), and Lovastatin.  Diagnosis of myalgia due to statin intolerance added to PMHx.   Thyroid nodule Assessment & Plan: Last TSH 1.04. 02/19/17 thyroid US:The gland is markedly heterogeneous.Small nodules an a simple cyst do not meet criteria for  follow-up or biopsy. Will continue monitoring TSH annually.  Orders: -     TSH; Future  Class 1 obesity with serious comorbidity and body mass index (BMI) of 31.0 to 31.9 in adult, unspecified obesity type Assessment & Plan: She understands the benefits of wt loss as well as adverse effects of obesity. Consistency with healthy diet and physical activity encouraged. She is not interested in establishing with Healthy [facility] and wellness clinic.   Asymptomatic postmenopausal estrogen deficiency -     DG Bone Density; Future  Return in about 1 year (around 10/25/2023) for AWV and chronic problems.  Leilana Mcquire G. [name], MD  [facility]. [place] office.

## 2022-10-24 ENCOUNTER — Ambulatory Visit (INDEPENDENT_AMBULATORY_CARE_PROVIDER_SITE_OTHER): Payer: Medicare Other | Admitting: Family Medicine

## 2022-10-24 ENCOUNTER — Encounter: Payer: Self-pay | Admitting: Family Medicine

## 2022-10-24 VITALS — BP 120/70 | HR 65 | Temp 98.6°F | Resp 16 | Ht 61.0 in | Wt 165.2 lb

## 2022-10-24 DIAGNOSIS — Z0000 Encounter for general adult medical examination without abnormal findings: Secondary | ICD-10-CM

## 2022-10-24 DIAGNOSIS — E7800 Pure hypercholesterolemia, unspecified: Secondary | ICD-10-CM | POA: Diagnosis not present

## 2022-10-24 DIAGNOSIS — E041 Nontoxic single thyroid nodule: Secondary | ICD-10-CM | POA: Diagnosis not present

## 2022-10-24 DIAGNOSIS — I700 Atherosclerosis of aorta: Secondary | ICD-10-CM

## 2022-10-24 DIAGNOSIS — I10 Essential (primary) hypertension: Secondary | ICD-10-CM | POA: Diagnosis not present

## 2022-10-24 DIAGNOSIS — E66811 Obesity, class 1: Secondary | ICD-10-CM

## 2022-10-24 DIAGNOSIS — G720 Drug-induced myopathy: Secondary | ICD-10-CM

## 2022-10-24 DIAGNOSIS — Z6831 Body mass index (BMI) 31.0-31.9, adult: Secondary | ICD-10-CM

## 2022-10-24 DIAGNOSIS — Z780 Asymptomatic menopausal state: Secondary | ICD-10-CM

## 2022-10-24 DIAGNOSIS — E669 Obesity, unspecified: Secondary | ICD-10-CM

## 2022-10-24 DIAGNOSIS — T466X5A Adverse effect of antihyperlipidemic and antiarteriosclerotic drugs, initial encounter: Secondary | ICD-10-CM

## 2022-10-24 LAB — COMPREHENSIVE METABOLIC PANEL
ALT: 23 U/L (ref 0–35)
AST: 20 U/L (ref 0–37)
Albumin: 4.3 g/dL (ref 3.5–5.2)
Alkaline Phosphatase: 97 U/L (ref 39–117)
BUN: 18 mg/dL (ref 6–23)
CO2: 25 mEq/L (ref 19–32)
Calcium: 9.7 mg/dL (ref 8.4–10.5)
Chloride: 108 mEq/L (ref 96–112)
Creatinine, Ser: 0.75 mg/dL (ref 0.40–1.20)
GFR: 80.38 mL/min (ref >60.00)
Glucose, Bld: 93 mg/dL (ref 70–99)
Potassium: 3.8 mEq/L (ref 3.5–5.1)
Sodium: 141 mEq/L (ref 135–145)
Total Bilirubin: 0.6 mg/dL (ref 0.2–1.2)
Total Protein: 7.1 g/dL (ref 6.0–8.3)

## 2022-10-24 LAB — TSH: TSH: 0.58 u[IU]/mL (ref 0.35–5.50)

## 2022-10-24 NOTE — Patient Instructions (Addendum)
  Cheryl Barnes , Thank you for taking time to come for your Medicare Wellness Visit. I appreciate your ongoing commitment to your health goals. Please review the following plan we discussed and let me know if I can assist you in the future.   These are the goals we discussed:  Goals      Exercise 3x per week (30 min per time)     Tai chi and/or walking videos for 15-30 min. 1-2 fruits daily and 2-3 portions of green vegetables.        This is a list of the screening recommended for you and due dates:  Health Maintenance  Topic Date Due   COVID-19 Vaccine (1) 11/08/2022*   Flu Shot  11/25/2022*   Zoster (Shingles) Vaccine (1 of 2) 08/30/2023*   Mammogram  03/25/2023   Medicare Annual Wellness Visit  10/25/2023   Colon Cancer Screening  10/30/2023   Pneumonia Vaccine  Completed   DEXA scan (bone density measurement)  Completed   Hepatitis C Screening: USPSTF Recommendation to screen - Ages 32-79 yo.  Completed   HPV Vaccine  Aged Out   DTaP/Tdap/Td vaccine  Discontinued  *Topic was postponed. The date shown is not the original due date.   A few things to remember from today's visit:  Medicare annual wellness visit, subsequent  Essential hypertension - Plan: Comprehensive metabolic panel  Pure hypercholesterolemia - Plan: Comprehensive metabolic panel  Atherosclerosis of aorta ([place]), Chronic  Statin myopathy  Thyroid nodule - Plan: TSH  If you need refills for medications you take chronically, please call your pharmacy. Do not use My Chart to request refills or for acute issues that need immediate attention. If you send a my chart message, it may take a few days to be addressed, specially if I am not in the office.  Please be sure medication list is accurate. If a new problem present, please set up appointment sooner than planned today.

## 2022-10-28 NOTE — Assessment & Plan Note (Signed)
She understands the benefits of wt loss as well as adverse effects of obesity. Consistency with healthy diet and physical activity encouraged. She is not interested in establishing with [facility] and wellness clinic.

## 2022-10-28 NOTE — Assessment & Plan Note (Addendum)
BP adequately controlled. Recommend monitoring BP at home. Continue Amlodipine 5 mg daily and Losartan 50 mg daily. Low salt diet. Annual eye exam. She would like annual follow ups.

## 2022-10-28 NOTE — Assessment & Plan Note (Addendum)
She has not tolerated statins in the past: Pravastatin, Atorvastatin,and Lovastatin among some. Continue low fat diet. She is not interested in trying medications, so we decided not to check FLP today.

## 2022-10-28 NOTE — Assessment & Plan Note (Signed)
Last TSH 1.04. 02/19/17 thyroid US:The gland is markedly heterogeneous.Small nodules an a simple cyst do not meet criteria for follow-up or biopsy. Will continue monitoring TSH annually.

## 2022-10-28 NOTE — Assessment & Plan Note (Signed)
She experienced side effects with previous statins: Pravastatin, Atorvastatin (Lipitor), and Lovastatin.  Diagnosis of myalgia due to statin intolerance added to PMHx.

## 2022-10-28 NOTE — Assessment & Plan Note (Signed)
We discussed the importance of staying active, physically and mentally, as well as the benefits of a healthy/balance diet. Low impact exercise that involve stretching and strengthing are ideal. Vaccines up to date. We discussed preventive screening for the next 5-10 years, summery of recommendations given in AVS.   Goals      Exercise 3x per week (30 min per time)     Tai chi and/or walking videos for 15-30 min. 1-2 fruits daily and 2-3 portions of green vegetables.       Health Maintenance  Topic Date Due   COVID-19 Vaccine (1) 11/08/2022*   Flu Shot  11/25/2022*   Zoster (Shingles) Vaccine (1 of 2) 08/30/2023*   Mammogram  03/25/2023   Medicare Annual Wellness Visit  10/25/2023   Colon Cancer Screening  10/30/2023   Pneumonia Vaccine  Completed   DEXA scan (bone density measurement)  Completed   Hepatitis C Screening: USPSTF Recommendation to screen - Ages 68-79 yo.  Completed   HPV Vaccine  Aged Out   DTaP/Tdap/Td vaccine  Discontinued  *Topic was postponed. The date shown is not the original due date.   Fall prevention. Advance directives and end of life discussed, she has POA and living will.

## 2022-10-28 NOTE — Assessment & Plan Note (Signed)
She is not on statin medication, has tried a few and developed muscle aches.

## 2022-11-13 ENCOUNTER — Other Ambulatory Visit: Payer: Self-pay | Admitting: Family Medicine

## 2022-11-13 DIAGNOSIS — I10 Essential (primary) hypertension: Secondary | ICD-10-CM

## 2022-11-15 ENCOUNTER — Other Ambulatory Visit: Payer: Self-pay | Admitting: Family Medicine

## 2022-11-15 DIAGNOSIS — Z1231 Encounter for screening mammogram for malignant neoplasm of breast: Secondary | ICD-10-CM

## 2023-01-08 ENCOUNTER — Ambulatory Visit: Payer: Medicare Other | Admitting: Physician Assistant

## 2023-02-06 ENCOUNTER — Other Ambulatory Visit (INDEPENDENT_AMBULATORY_CARE_PROVIDER_SITE_OTHER): Payer: Medicare Other

## 2023-02-06 ENCOUNTER — Other Ambulatory Visit: Payer: Self-pay

## 2023-02-06 ENCOUNTER — Ambulatory Visit (INDEPENDENT_AMBULATORY_CARE_PROVIDER_SITE_OTHER): Payer: Medicare Other | Admitting: Orthopaedic Surgery

## 2023-02-06 DIAGNOSIS — M1612 Unilateral primary osteoarthritis, left hip: Secondary | ICD-10-CM | POA: Diagnosis not present

## 2023-02-06 DIAGNOSIS — M1611 Unilateral primary osteoarthritis, right hip: Secondary | ICD-10-CM | POA: Diagnosis not present

## 2023-02-06 NOTE — Progress Notes (Signed)
Office Visit Note   Patient: Cheryl Barnes           Date of Birth: [birth year]           MRN: [id number] Visit Date: 02/06/2023              Requested by: [place], [name], MD [address] [place],  [place] [address] PCP: [place], [name], MD   Assessment & Plan: Visit Diagnoses:  1. Primary osteoarthritis of right hip   2. Primary osteoarthritis of left hip     Plan: Impression is severe right hip degenerative joint disease secondary to Osteoarthritis.  Imaging shows bone on bone joint space narrowing.  At this point, conservative treatments fail to provide any significant relief and the pain is severely affecting ADLs and quality of life.  Based on treatment options, the patient has elected to move forward with a hip replacement.  We have discussed the surgical risks that include but are not limited to infection, DVT, leg length discrepancy, numbness, tingling, incomplete relief of pain.  Recovery and prognosis were also reviewed.    Eunice Blase will call the patient to confirm surgery time.  Current anticoagulants: No antithrombotic Postop anticoagulation: Aspirin 81 mg Diabetic: No  Prior DVT/PE: No Tobacco use: No Clearances needed for surgery: Betty [place] PCP Anticipate discharge dispo: home   Follow-Up Instructions: No follow-ups on file.   Orders:  Orders Placed This Encounter  Procedures   XR HIP UNILAT W OR W/O PELVIS 2-3 VIEWS RIGHT   XR HIP UNILAT W OR W/O PELVIS 2-3 VIEWS LEFT   No orders of the defined types were placed in this encounter.     Procedures: No procedures performed   Clinical Data: No additional findings.   Subjective: Chief Complaint  Patient presents with   Left Hip - Pain   Right Hip - Pain    HPI Asjia is a very pleasant 72 year old female here for severe bilateral hip and groin pain for years that has progressively gotten worse.  She is currently using Advil for temporary relief.  She has taken Celebrex in the past.   The pain causes interference and daily activities as well as quality of life.  Denies any radicular symptoms. Review of Systems  Constitutional: Negative.   HENT: Negative.    Eyes: Negative.   Respiratory: Negative.    Cardiovascular: Negative.   Endocrine: Negative.   Musculoskeletal: Negative.   Neurological: Negative.   Hematological: Negative.   Psychiatric/Behavioral: Negative.    All other systems reviewed and are negative.    Objective: Vital Signs: There were no vitals taken for this visit.  Physical Exam Vitals and nursing note reviewed.  Constitutional:      Appearance: She is well-developed.  HENT:     Head: Atraumatic.     Nose: Nose normal.  Eyes:     Extraocular Movements: Extraocular movements intact.  Cardiovascular:     Pulses: Normal pulses.  Pulmonary:     Effort: Pulmonary effort is normal.  Abdominal:     Palpations: Abdomen is soft.  Musculoskeletal:     Cervical back: Neck supple.  Skin:    General: Skin is warm.     Capillary Refill: Capillary refill takes less than 2 seconds.  Neurological:     Mental Status: She is alert. Mental status is at baseline.  Psychiatric:        Behavior: Behavior normal.        Thought Content: Thought content normal.  Judgment: Judgment normal.     Ortho Exam Examination of bilateral hips show pain with hip flexion and internal and external rotation.  There is no trochanteric tenderness.  Positive Stinchfield. Specialty Comments:  No specialty comments available.  Imaging: XR HIP UNILAT W OR W/O PELVIS 2-3 VIEWS RIGHT  Result Date: 02/06/2023 Advanced degenerative joint disease with bone-on-bone joint space narrowing.  XR HIP UNILAT W OR W/O PELVIS 2-3 VIEWS LEFT  Result Date: 02/06/2023 Advanced degenerative joint disease with bone-on-bone joint space narrowing.    PMFS History: Patient Active Problem List   Diagnosis Date Noted   Primary osteoarthritis of left hip 02/06/2023   Statin  myopathy 10/24/2022   Atherosclerosis of aorta (HCC) 10/17/2020   Nocturnal leg cramps 10/01/2018   Splenic artery aneurysm (HCC) 03/01/2017   Thyroid nodule 01/27/2017   Acute diverticulitis 01/24/2017   Recurrent herpes labialis 09/21/2016   Pure hypercholesterolemia 09/05/2016   Class 1 obesity with body mass index (BMI) of 31.0 to 31.9 in adult 09/05/2016   Bilateral hip pain 09/14/2015   Medicare annual wellness visit, subsequent 09/14/2015   Benign hypertension 07/17/2015   Sigmoid diverticulitis 01/27/2015   LBBB (left bundle branch block) 12/23/2014   Hematochezia 12/23/2014   Abdominal pain    Diverticulitis of large intestine without perforation or abscess without bleeding    Chest pain    Solitary pulmonary nodule 06/09/2014   Left lower lobe pneumonia 10/06/2013   Urinary incontinence in female 03/02/2013   Postmenopause atrophic vaginitis 03/02/2013   RUQ PAIN 04/01/2007   Essential hypertension 03/24/2007   Diverticulosis of colon 03/17/2007   Past Medical History:  Diagnosis Date   Allergy    Diverticulitis    Hypertension    LBBB (left bundle branch block)    Migraine    history   Nonobstructive atherosclerosis of coronary artery     Family History  Problem Relation Age of Onset   Hypertension Father    Diabetes Father        adult onset   Colon cancer Father    Hypertension Mother    Breast cancer Mother        breast   Cancer Mother        breast   Asthma Maternal Grandfather    Hypertension Brother    Diabetes Brother    Stomach cancer Neg Hx    Pancreatic cancer Neg Hx     Past Surgical History:  Procedure Laterality Date   CESAREAN SECTION     CHOLECYSTECTOMY     FRACTURE SURGERY     PARTIAL HYSTERECTOMY     Abdominal   ROTATOR CUFF REPAIR Left    ROTATOR CUFF REPAIR Right    Social History   Occupational History   Occupation: ophthalmology @ [employer]    Employer: [employer]  Tobacco Use   Smoking status: Never   Smokeless  tobacco: Never  Vaping Use   Vaping Use: Never used  Substance and Sexual Activity   Alcohol use: No   Drug use: No   Sexual activity: Never

## 2023-02-07 ENCOUNTER — Telehealth: Payer: Self-pay | Admitting: Orthopaedic Surgery

## 2023-02-07 NOTE — Telephone Encounter (Signed)
Left message on patient's voicemail asking her to call me regarding setting up her surgery.  Provided name and my direct number to call back.

## 2023-03-07 ENCOUNTER — Telehealth: Payer: Self-pay | Admitting: Orthopaedic Surgery

## 2023-03-07 NOTE — Telephone Encounter (Signed)
Patient left message on voice mail 03/06/23 stating she would like to cancel surgery and reschedule to a later date.  I returned patient's call. No answer. Left message on patient's voicemail, providing my name and direct number.

## 2023-03-20 ENCOUNTER — Emergency Department (HOSPITAL_BASED_OUTPATIENT_CLINIC_OR_DEPARTMENT_OTHER)
Admission: EM | Admit: 2023-03-20 | Discharge: 2023-03-20 | Disposition: A | Discharge status: Home / Self Care | Payer: Medicare Other | Attending: Emergency Medicine | Admitting: Emergency Medicine

## 2023-03-20 ENCOUNTER — Emergency Department (HOSPITAL_BASED_OUTPATIENT_CLINIC_OR_DEPARTMENT_OTHER): Payer: Medicare Other

## 2023-03-20 ENCOUNTER — Other Ambulatory Visit: Payer: Self-pay

## 2023-03-20 ENCOUNTER — Encounter (HOSPITAL_BASED_OUTPATIENT_CLINIC_OR_DEPARTMENT_OTHER): Payer: Self-pay

## 2023-03-20 DIAGNOSIS — K5732 Diverticulitis of large intestine without perforation or abscess without bleeding: Secondary | ICD-10-CM | POA: Insufficient documentation

## 2023-03-20 DIAGNOSIS — R1032 Left lower quadrant pain: Secondary | ICD-10-CM | POA: Diagnosis present

## 2023-03-20 DIAGNOSIS — Z91040 Latex allergy status: Secondary | ICD-10-CM | POA: Diagnosis not present

## 2023-03-20 DIAGNOSIS — K5792 Diverticulitis of intestine, part unspecified, without perforation or abscess without bleeding: Secondary | ICD-10-CM

## 2023-03-20 LAB — COMPREHENSIVE METABOLIC PANEL
ALT: 23 U/L (ref 0–44)
AST: 20 U/L (ref 15–41)
Albumin: 4.7 g/dL (ref 3.5–5.0)
Alkaline Phosphatase: 107 U/L (ref 38–126)
Anion gap: 8 (ref 5–15)
BUN: 14 mg/dL (ref 8–23)
CO2: 25 mmol/L (ref 22–32)
Calcium: 9.6 mg/dL (ref 8.9–10.3)
Chloride: 106 mmol/L (ref 98–111)
Creatinine, Ser: 0.87 mg/dL (ref 0.44–1.00)
GFR, Estimated: >60 mL/min (ref >60)
Glucose, Bld: 112 mg/dL — ABNORMAL HIGH (ref 70–99)
Potassium: 4.5 mmol/L (ref 3.5–5.1)
Sodium: 139 mmol/L (ref 135–145)
Total Bilirubin: 0.8 mg/dL (ref 0.3–1.2)
Total Protein: 7.9 g/dL (ref 6.5–8.1)

## 2023-03-20 LAB — URINALYSIS, ROUTINE W REFLEX MICROSCOPIC
Bilirubin Urine: NEGATIVE
Glucose, UA: NEGATIVE mg/dL
Hgb urine dipstick: NEGATIVE
Leukocytes,Ua: NEGATIVE
Nitrite: NEGATIVE
Specific Gravity, Urine: >1.046 — ABNORMAL HIGH (ref 1.005–1.030)
pH: 6.5 (ref 5.0–8.0)

## 2023-03-20 LAB — CBC
HCT: 41.7 % (ref 36.0–46.0)
Hemoglobin: 14.1 g/dL (ref 12.0–15.0)
MCH: 31 pg (ref 26.0–34.0)
MCHC: 33.8 g/dL (ref 30.0–36.0)
MCV: 91.6 fL (ref 80.0–100.0)
Platelets: 156 10*3/uL (ref 150–400)
RBC: 4.55 MIL/uL (ref 3.87–5.11)
RDW: 13 % (ref 11.5–15.5)
WBC: 9.8 10*3/uL (ref 4.0–10.5)
nRBC: 0 % (ref 0.0–0.2)

## 2023-03-20 LAB — LIPASE, BLOOD: Lipase: 18 U/L (ref 11–51)

## 2023-03-20 MED ORDER — KETOROLAC TROMETHAMINE 30 MG/ML IJ SOLN
30.0000 mg | Freq: Once | INTRAMUSCULAR | Status: AC
  Administered 2023-03-20: 30 mg via INTRAVENOUS
  Filled 2023-03-20: qty 1

## 2023-03-20 MED ORDER — METRONIDAZOLE 500 MG PO TABS: 500.0000 mg | ORAL_TABLET | Freq: Three times a day (TID) | ORAL | 0 refills | Status: AC

## 2023-03-20 MED ORDER — ONDANSETRON HCL 4 MG PO TABS: 4.0000 mg | ORAL_TABLET | Freq: Four times a day (QID) | ORAL | 0 refills | Status: AC

## 2023-03-20 MED ORDER — METRONIDAZOLE 500 MG PO TABS
500.0000 mg | ORAL_TABLET | Freq: Once | ORAL | Status: AC
  Administered 2023-03-20: 500 mg via ORAL
  Filled 2023-03-20: qty 1

## 2023-03-20 MED ORDER — CIPROFLOXACIN HCL 500 MG PO TABS: 500.0000 mg | ORAL_TABLET | Freq: Two times a day (BID) | ORAL | 0 refills | Status: AC

## 2023-03-20 MED ORDER — IBUPROFEN 600 MG PO TABS: 600.0000 mg | ORAL_TABLET | Freq: Three times a day (TID) | ORAL | 0 refills | Status: DC | PRN

## 2023-03-20 MED ORDER — IOHEXOL 300 MG/ML  SOLN
100.0000 mL | Freq: Once | INTRAMUSCULAR | Status: AC | PRN
  Administered 2023-03-20: 85 mL via INTRAVENOUS

## 2023-03-20 MED ORDER — CIPROFLOXACIN HCL 500 MG PO TABS
500.0000 mg | ORAL_TABLET | Freq: Once | ORAL | Status: AC
  Administered 2023-03-20: 500 mg via ORAL
  Filled 2023-03-20: qty 1

## 2023-03-20 NOTE — ED Provider Notes (Signed)
EMERGENCY DEPARTMENT AT [facility] Provider Note   CSN: [id number] Arrival date & time: 03/20/23  1125     History  Chief Complaint  Patient presents with   Abdominal Pain    Cheryl Barnes is a 72 y.o. female with a history of diverticulosis and diverticulitis presenting to ED with left lower quadrant abdominal pain and nausea.  Patient reports has been having the symptoms for a week.  She thought she was having a "mild" flareup of her diverticulitis, and has not been on antibiotics.  However pain significantly worsened last night.  She reports she has felt constipated and bloated for the past 24 hours, though she did have a small bowel movement today.  She denies blood in her stool.  She reports a history of cholecystectomy and hysterectomy, no other abdominal surgeries.  HPI     Home Medications Prior to Admission medications   Medication Sig Start Date End Date Taking? Authorizing Provider  ciprofloxacin (CIPRO) 500 MG tablet Take 1 tablet (500 mg total) by mouth every 12 (twelve) hours for 7 days. 03/20/23 03/27/23 Yes Gwen Edler, Kermit Balo, MD  ibuprofen (ADVIL) 600 MG tablet Take 1 tablet (600 mg total) by mouth every 8 (eight) hours as needed for up to 21 doses for mild pain or headache. 03/20/23  Yes Tammela Bales, Kermit Balo, MD  metroNIDAZOLE (FLAGYL) 500 MG tablet Take 1 tablet (500 mg total) by mouth 3 (three) times daily for 7 days. 03/20/23 03/27/23 Yes Avis Tirone, Kermit Balo, MD  ondansetron (ZOFRAN) 4 MG tablet Take 1 tablet (4 mg total) by mouth every 6 (six) hours for 10 days. 03/20/23 03/30/23 Yes Terald Sleeper, MD  amLODipine (NORVASC) 5 MG tablet TAKE 1 TABLET DAILY 11/13/22   [place], [name], MD  Calcium Citrate-Vitamin D (CALCIUM + D PO) Take 600 mg by mouth 2 (two) times daily.    [provider]  cetirizine (ZYRTEC) 10 MG tablet Take 10 mg by mouth daily.    [provider]  losartan (COZAAR) 50 MG tablet Take 1 tablet (50 mg total) by  mouth daily. 05/08/22   [place], [name], MD  valACYclovir (VALTREX) 1000 MG tablet Take 2 tablets by mouth as needed upon acute onset and asap repeat dose in 12 hours 10/01/18   [place], [name], MD      Allergies    Penicillins, Seasonal ic [cholestatin], Tape, and Latex    Review of Systems   Review of Systems  Physical Exam Updated Vital Signs BP (!) 142/78 (BP Location: Right Arm)   Pulse 87   Temp 99.3 F (37.4 C) (Oral)   Resp 18   Ht 5\' 1"  (1.549 m)   Wt 74.8 kg   SpO2 98%   BMI 31.18 kg/m  Physical Exam Constitutional:      General: She is not in acute distress. HENT:     Head: Normocephalic and atraumatic.  Eyes:     Conjunctiva/sclera: Conjunctivae normal.     Pupils: Pupils are equal, round, and reactive to light.  Cardiovascular:     Rate and Rhythm: Normal rate and regular rhythm.  Pulmonary:     Effort: Pulmonary effort is normal. No respiratory distress.  Abdominal:     General: There is no distension.     Tenderness: There is abdominal tenderness in the left upper quadrant and left lower quadrant. There is guarding.  Skin:    General: Skin is warm and dry.  Neurological:  General: No focal deficit present.     Mental Status: She is alert. Mental status is at baseline.  Psychiatric:        Mood and Affect: Mood normal.        Behavior: Behavior normal.     ED Results / Procedures / Treatments   Labs (all labs ordered are listed, but only abnormal results are displayed) Labs Reviewed  COMPREHENSIVE METABOLIC PANEL - Abnormal; Notable for the following components:      Result Value   Glucose, Bld 112 (*)    All other components within normal limits  LIPASE, BLOOD  CBC  URINALYSIS, ROUTINE W REFLEX MICROSCOPIC    EKG None  Radiology CT ABDOMEN PELVIS W CONTRAST  Result Date: 03/20/2023 CLINICAL DATA:  Left lower quadrant pain. Nausea. Recurrent diverticulitis. EXAM: CT ABDOMEN AND PELVIS WITH CONTRAST TECHNIQUE: Multidetector CT imaging of  the abdomen and pelvis was performed using the standard protocol following bolus administration of intravenous contrast. RADIATION DOSE REDUCTION: This exam was performed according to the departmental dose-optimization program which includes automated exposure control, adjustment of the mA and/or kV according to patient size and/or use of iterative reconstruction technique. CONTRAST:  85mL OMNIPAQUE IOHEXOL 300 MG/ML  SOLN COMPARISON:  05/22/2017 FINDINGS: Lower Chest: No acute findings. Hepatobiliary: No suspicious hepatic masses identified. Prior cholecystectomy. No evidence of biliary obstruction. Pancreas:  No mass or inflammatory changes. Spleen: Within normal limits in size and appearance. Adrenals/Urinary Tract: No suspicious masses identified. No evidence of ureteral calculi or hydronephrosis. Stomach/Bowel: Stable tiny hiatal hernia. Diffuse colonic diverticulosis is noted. Mild-to-moderate diverticulitis is seen involving the distal descending colon. No evidence of perforation or abscess. Vascular/Lymphatic: No pathologically enlarged lymph nodes. No acute vascular findings. Reproductive: Prior hysterectomy noted. Adnexal regions are unremarkable in appearance. Other:  None. Musculoskeletal:  No suspicious bone lesions identified. IMPRESSION: Mild-to-moderate diverticulitis involving the distal descending colon. No evidence of perforation or abscess. Stable tiny hiatal hernia. Electronically Signed   By: Danae Orleans M.D.   On: 03/20/2023 14:45    Procedures Procedures    Medications Ordered in ED Medications  ketorolac (TORADOL) 30 MG/ML injection 30 mg (30 mg Intravenous Given 03/20/23 1257)  iohexol (OMNIPAQUE) 300 MG/ML solution 100 mL (85 mLs Intravenous Contrast Given 03/20/23 1349)  ciprofloxacin (CIPRO) tablet 500 mg (500 mg Oral Given 03/20/23 1519)  metroNIDAZOLE (FLAGYL) tablet 500 mg (500 mg Oral Given 03/20/23 1519)    ED Course/ Medical Decision Making/ A&P                              Medical Decision Making Amount and/or Complexity of Data Reviewed Labs: ordered. Radiology: ordered.  Risk Prescription drug management.   This patient presents to the ED with concern for abdominal pain. This involves an extensive number of treatment options, and is a complaint that carries with it a high risk of complications and morbidity.  The differential diagnosis includes diverticulitis versus bowel perforation versus bowel obstruction versus ileus versus colitis versus other  Co-morbidities that complicate the patient evaluation: History of diverticulosis at high risk of diverticulitis  I ordered and personally interpreted labs.  The pertinent results include: No emergent findings  I ordered imaging studies including CT abdomen pelvis I independently visualized and interpreted imaging which showed acute uncomplicated diverticulitis I agree with the radiologist interpretation  The patient was maintained on a cardiac monitor.  I personally viewed and interpreted the cardiac monitored which showed  an underlying rhythm of: Sinus rhythm  I ordered medication including IV Toradol for pain, Cipro Flagyl for diverticulitis  I have reviewed the patients home medicines and have made adjustments as needed   After the interventions noted above, I reevaluated the patient and found that they have: improved   Dispostion:  After consideration of the diagnostic results and the patients response to treatment, I feel that the patent would benefit from close outpatient follow-up.  Hospitalization was considered, but at this point the patient is able to tolerate p.o., no sign of evidence of surgical emergency, and she would prefer to go home, and I think this is reasonable.  Close return precautions were discussed         Final Clinical Impression(s) / ED Diagnoses Final diagnoses:  Diverticulitis    Rx / DC Orders ED Discharge Orders          Ordered    ciprofloxacin  (CIPRO) 500 MG tablet  Every 12 hours        03/20/23 1502    metroNIDAZOLE (FLAGYL) 500 MG tablet  3 times daily        03/20/23 1502    ondansetron (ZOFRAN) 4 MG tablet  Every 6 hours        03/20/23 1502    ibuprofen (ADVIL) 600 MG tablet  Every 8 hours PRN        03/20/23 1502              Terald Sleeper, MD 03/20/23 1521

## 2023-03-20 NOTE — ED Triage Notes (Signed)
Reports has flare ups with diverticulitis that started a week ago and went away but last night the pain came back worse than she has ever had it.  Reports LLQ pain and headache, +nausea.  Denies blood in stool.

## 2023-03-20 NOTE — ED Notes (Signed)
Pt unable to provide urine sample at this time 

## 2023-04-15 ENCOUNTER — Other Ambulatory Visit: Payer: Self-pay | Admitting: Family Medicine

## 2023-04-22 ENCOUNTER — Ambulatory Visit: Payer: Medicare Other

## 2023-04-22 ENCOUNTER — Telehealth: Payer: Self-pay | Admitting: Family Medicine

## 2023-04-22 ENCOUNTER — Telehealth: Payer: Self-pay | Admitting: Orthopaedic Surgery

## 2023-04-22 NOTE — Telephone Encounter (Signed)
FYI.Marland KitchenMarland KitchenMarland KitchenMarland KitchenMarland KitchenPt wanted [place] to know her diverticulitis is flaring up again and she will be contacting her PCP to make sure she is still good for surgery incase any further action is needed

## 2023-04-22 NOTE — Telephone Encounter (Signed)
Pt call and wanted more antibiotic but pt need a appt but stated she will go the ER .

## 2023-04-22 NOTE — Telephone Encounter (Signed)
Ok, thanks  [place], [place]

## 2023-05-02 NOTE — Progress Notes (Signed)
Surgical Instructions   Your procedure is scheduled on Setempber 16, 2024. Report to [facility] Main Entrance "A" at 08:30 A.M., then check in with the Admitting office. Any questions or running late day of surgery: call [phone]  Questions prior to your surgery date: call [phone], Monday-Friday, 8am-4pm. If you experience any cold or flu symptoms such as cough, fever, chills, shortness of breath, etc. between now and your scheduled surgery, please notify us at the above number.     Remember:  Do not eat after midnight the night before your surgery   You may drink clear liquids until 07:30 the morning of your surgery.   Clear liquids allowed are: Water, Non-Citrus Juices (without pulp), Carbonated Beverages, Clear Tea, Black Coffee Only (NO MILK, CREAM OR POWDERED CREAMER of any kind), and Gatorade.      Take these medicines the morning of surgery with A SIP OF WATER  amLODipine (NORVASC)  cetirizine (ZYRTEC)     May take these medicines IF NEEDED: acetaminophen (TYLENOL)     One week prior to surgery, STOP taking any Aspirin (unless otherwise instructed by your surgeon) Aleve, Naproxen, Ibuprofen, Motrin, Advil, Goody's, BC's, all herbal medications, fish oil, and non-prescription vitamins.                     Do NOT Smoke (Tobacco/Vaping) for 24 hours prior to your procedure.  You will be asked to remove any contacts, glasses, piercing's, hearing aid's, dentures/partials prior to surgery. Please bring cases for these items if needed.    Patients discharged the day of surgery will not be allowed to drive home, and someone needs to stay with them for 24 hours.  SURGICAL WAITING ROOM VISITATION Patients may have no more than 2 support people in the waiting area - these visitors may rotate.   Pre-op nurse will coordinate an appropriate time for 1 ADULT support person, who may not rotate, to accompany patient in pre-op.  Children under the age of 32 must have an  adult with them who is not the patient and must remain in the main waiting area with an adult.  If the patient needs to stay at the hospital during part of their recovery, the visitor guidelines for inpatient rooms apply.  Please refer to the [facility] website for the visitor guidelines for any additional information.   If you received a COVID test during your pre-op visit  it is requested that you wear a mask when out in public, stay away from anyone that may not be feeling well and notify your surgeon if you develop symptoms. If you have been in contact with anyone that has tested positive in the last 10 days please notify you surgeon.      Pre-operative CHG Bathing Instructions   You can play a key role in reducing the risk of infection after surgery. Your skin needs to be as free of germs as possible. You can reduce the number of germs on your skin by washing with CHG (chlorhexidine gluconate) soap before surgery. CHG is an antiseptic soap that kills germs and continues to kill germs even after washing.   DO NOT use if you have an allergy to chlorhexidine/CHG or antibacterial soaps. If your skin becomes reddened or irritated, stop using the CHG and notify one of our RNs at [phone].              TAKE A SHOWER THE NIGHT BEFORE SURGERY AND THE DAY OF SURGERY  Please keep in mind the following:  DO NOT shave, including legs and underarms, 48 hours prior to surgery.   You may shave your face before/day of surgery.  Place clean sheets on your bed the night before surgery Use a clean washcloth (not used since being washed) for each shower. DO NOT sleep with pet's night before surgery.  CHG Shower Instructions:  If you choose to wash your hair and private area, wash first with your normal shampoo/soap.  After you use shampoo/soap, rinse your hair and body thoroughly to remove shampoo/soap residue.  Turn the water OFF and apply half the bottle of CHG soap to a CLEAN washcloth.   Apply CHG soap ONLY FROM YOUR NECK DOWN TO YOUR TOES (washing for 3-5 minutes)  DO NOT use CHG soap on face, private areas, open wounds, or sores.  Pay special attention to the area where your surgery is being performed.  If you are having back surgery, having someone wash your back for you may be helpful. Wait 2 minutes after CHG soap is applied, then you may rinse off the CHG soap.  Pat dry with a clean towel  Put on clean pajamas    Additional instructions for the day of surgery: DO NOT APPLY any lotions, deodorants, cologne, or perfumes.   Do not wear jewelry or makeup Do not wear nail polish, gel polish, artificial nails, or any other type of covering on natural nails (fingers and toes) Do not bring valuables to the hospital. [facility] is not responsible for valuables/personal belongings. Put on clean/comfortable clothes.  Please brush your teeth.  Ask your nurse before applying any prescription medications to the skin.

## 2023-05-02 NOTE — Progress Notes (Signed)
Surgical Instructions   Your procedure is scheduled on Setempber 16, 2024. Report to [facility] Main Entrance "A" at 08:00 A.M., then check in with the Admitting office. Any questions or running late day of surgery: call [phone]  Questions prior to your surgery date: call [phone], Monday-Friday, 8am-4pm. If you experience any cold or flu symptoms such as cough, fever, chills, shortness of breath, etc. between now and your scheduled surgery, please notify us at the above number.     Remember:  Do not eat after midnight the night before your surgery   You may drink clear liquids until 07:30 the morning of your surgery.   Clear liquids allowed are: Water, Non-Citrus Juices (without pulp), Carbonated Beverages, Clear Tea, Black Coffee Only (NO MILK, CREAM OR POWDERED CREAMER of any kind), and Gatorade.  Patient Instructions  The night before surgery:  No food after midnight. ONLY clear liquids after midnight  The day of surgery (if you do NOT have diabetes):  Drink ONE (1) Pre-Surgery Clear Ensure by 07:30 AM the morning of surgery. Drink in one sitting. Do not sip.  This drink was given to you during your hospital  pre-op appointment visit.  Nothing else to drink after completing the  Pre-Surgery Clear Ensure.          If you have questions, please contact your surgeon's office.     Take these medicines the morning of surgery with A SIP OF WATER  amLODipine (NORVASC)      May take these medicines IF NEEDED: acetaminophen (TYLENOL)  valACYclovir (VALTREX)    One week prior to surgery, STOP taking any Aspirin (unless otherwise instructed by your surgeon) Aleve, Naproxen, Ibuprofen, Motrin, Advil, Goody's, BC's, all herbal medications, fish oil, and non-prescription vitamins.                     Do NOT Smoke (Tobacco/Vaping) for 24 hours prior to your procedure.  You will be asked to remove any contacts, glasses, piercing's, hearing aid's, dentures/partials  prior to surgery. Please bring cases for these items if needed.    Patients discharged the day of surgery will not be allowed to drive home, and someone needs to stay with them for 24 hours.  SURGICAL WAITING ROOM VISITATION Patients may have no more than 2 support people in the waiting area - these visitors may rotate.   Pre-op nurse will coordinate an appropriate time for 1 ADULT support person, who may not rotate, to accompany patient in pre-op.  Children under the age of 11 must have an adult with them who is not the patient and must remain in the main waiting area with an adult.  If the patient needs to stay at the hospital during part of their recovery, the visitor guidelines for inpatient rooms apply.  Please refer to the [facility] website for the visitor guidelines for any additional information.   If you received a COVID test during your pre-op visit  it is requested that you wear a mask when out in public, stay away from anyone that may not be feeling well and notify your surgeon if you develop symptoms. If you have been in contact with anyone that has tested positive in the last 10 days please notify you surgeon.      Pre-operative CHG Bathing Instructions   You can play a key role in reducing the risk of infection after surgery. Your skin needs to be as free of germs as possible. You can reduce  the number of germs on your skin by washing with CHG (chlorhexidine gluconate) soap before surgery. CHG is an antiseptic soap that kills germs and continues to kill germs even after washing.   DO NOT use if you have an allergy to chlorhexidine/CHG or antibacterial soaps. If your skin becomes reddened or irritated, stop using the CHG and notify one of our RNs at [phone].              TAKE A SHOWER THE NIGHT BEFORE SURGERY AND THE DAY OF SURGERY    Please keep in mind the following:  DO NOT shave, including legs and underarms, 48 hours prior to surgery.   You may shave your face  before/day of surgery.  Place clean sheets on your bed the night before surgery Use a clean washcloth (not used since being washed) for each shower. DO NOT sleep with pet's night before surgery.  CHG Shower Instructions:  If you choose to wash your hair and private area, wash first with your normal shampoo/soap.  After you use shampoo/soap, rinse your hair and body thoroughly to remove shampoo/soap residue.  Turn the water OFF and apply half the bottle of CHG soap to a CLEAN washcloth.  Apply CHG soap ONLY FROM YOUR NECK DOWN TO YOUR TOES (washing for 3-5 minutes)  DO NOT use CHG soap on face, private areas, open wounds, or sores.  Pay special attention to the area where your surgery is being performed.  If you are having back surgery, having someone wash your back for you may be helpful. Wait 2 minutes after CHG soap is applied, then you may rinse off the CHG soap.  Pat dry with a clean towel  Put on clean pajamas    Additional instructions for the day of surgery: DO NOT APPLY any lotions, deodorants, cologne, or perfumes.   Do not wear jewelry or makeup Do not wear nail polish, gel polish, artificial nails, or any other type of covering on natural nails (fingers and toes) Do not bring valuables to the hospital. [facility] is not responsible for valuables/personal belongings. Put on clean/comfortable clothes.  Please brush your teeth.  Ask your nurse before applying any prescription medications to the skin.

## 2023-05-03 ENCOUNTER — Telehealth: Payer: Self-pay | Admitting: Orthopaedic Surgery

## 2023-05-03 ENCOUNTER — Other Ambulatory Visit: Payer: Self-pay

## 2023-05-03 ENCOUNTER — Encounter (HOSPITAL_COMMUNITY): Payer: Self-pay

## 2023-05-03 ENCOUNTER — Encounter (HOSPITAL_COMMUNITY)
Admission: RE | Admit: 2023-05-03 | Discharge: 2023-05-03 | Disposition: A | Discharge status: Home / Self Care | Payer: Medicare Other | Source: Ambulatory Visit | Attending: Orthopaedic Surgery | Admitting: Orthopaedic Surgery

## 2023-05-03 VITALS — BP 127/73 | HR 75 | Temp 98.1°F | Resp 18 | Ht 61.0 in | Wt 163.1 lb

## 2023-05-03 DIAGNOSIS — I10 Essential (primary) hypertension: Secondary | ICD-10-CM | POA: Diagnosis not present

## 2023-05-03 DIAGNOSIS — M1611 Unilateral primary osteoarthritis, right hip: Secondary | ICD-10-CM | POA: Insufficient documentation

## 2023-05-03 DIAGNOSIS — I2510 Atherosclerotic heart disease of native coronary artery without angina pectoris: Secondary | ICD-10-CM | POA: Diagnosis not present

## 2023-05-03 DIAGNOSIS — R9431 Abnormal electrocardiogram [ECG] [EKG]: Secondary | ICD-10-CM | POA: Insufficient documentation

## 2023-05-03 DIAGNOSIS — I447 Left bundle-branch block, unspecified: Secondary | ICD-10-CM | POA: Diagnosis not present

## 2023-05-03 DIAGNOSIS — Z01818 Encounter for other preprocedural examination: Secondary | ICD-10-CM | POA: Insufficient documentation

## 2023-05-03 DIAGNOSIS — I700 Atherosclerosis of aorta: Secondary | ICD-10-CM | POA: Diagnosis not present

## 2023-05-03 HISTORY — DX: Personal history of other diseases of the digestive system: Z87.19

## 2023-05-03 HISTORY — DX: Bilateral primary osteoarthritis of hip: M16.0

## 2023-05-03 LAB — BASIC METABOLIC PANEL
Anion gap: 11 (ref 5–15)
BUN: 13 mg/dL (ref 8–23)
CO2: 23 mmol/L (ref 22–32)
Calcium: 9.4 mg/dL (ref 8.9–10.3)
Chloride: 107 mmol/L (ref 98–111)
Creatinine, Ser: 0.93 mg/dL (ref 0.44–1.00)
GFR, Estimated: >60 mL/min (ref >60)
Glucose, Bld: 114 mg/dL — ABNORMAL HIGH (ref 70–99)
Potassium: 3.7 mmol/L (ref 3.5–5.1)
Sodium: 141 mmol/L (ref 135–145)

## 2023-05-03 LAB — TYPE AND SCREEN
ABO/RH(D): O POS
Antibody Screen: NEGATIVE

## 2023-05-03 LAB — CBC
HCT: 40.1 % (ref 36.0–46.0)
Hemoglobin: 13.2 g/dL (ref 12.0–15.0)
MCH: 29.8 pg (ref 26.0–34.0)
MCHC: 32.9 g/dL (ref 30.0–36.0)
MCV: 90.5 fL (ref 80.0–100.0)
Platelets: 174 10*3/uL (ref 150–400)
RBC: 4.43 MIL/uL (ref 3.87–5.11)
RDW: 13 % (ref 11.5–15.5)
WBC: 4.7 10*3/uL (ref 4.0–10.5)
nRBC: 0 % (ref 0.0–0.2)

## 2023-05-03 LAB — SURGICAL PCR SCREEN
MRSA, PCR: NEGATIVE
Staphylococcus aureus: NEGATIVE

## 2023-05-03 NOTE — Progress Notes (Signed)
PCP - Dr. Betty [place] Cardiologist - Dr. [name]  PPM/ICD - denies   Chest x-ray - 01/24/17 EKG - 05/03/23 Stress Test - 2017 per pt, with Dr. Jacinto Halim, no record, "non-remarkable" per notes ECHO - 12/01/20 Cardiac Cath - denies  Sleep Study - denies   DM- denies  ASA/Blood Thinner Instructions: n/a   ERAS Protcol - yes PRE-SURGERY Ensure given at PAT  COVID TEST- n/a   Anesthesia review: yes, cardiac hx (Pt last saw Dr. Odis Hollingshead in 2022 and said she was told that she did not need to f/u unless she was having any issues, and she has not had any issues.)  Patient denies shortness of breath, fever, cough and chest pain at PAT appointment   All instructions explained to the patient, with a verbal understanding of the material. Patient agrees to go over the instructions while at home for a better understanding.  The opportunity to ask questions was provided.

## 2023-05-03 NOTE — Telephone Encounter (Signed)
Pt would like Handicarp placard

## 2023-05-03 NOTE — Pre-Procedure Instructions (Signed)
 Pre-operative 5 CHG Bath Instructions   You can play a key role in reducing the risk of infection after surgery. Your skin needs to be as free of germs as possible. You can reduce the number of germs on your skin by washing with CHG (chlorhexidine gluconate) soap before surgery. CHG is an antiseptic soap that kills germs and continues to kill germs even after washing.   DO NOT use if you have an allergy to chlorhexidine/CHG or antibacterial soaps. If your skin becomes reddened or irritated, stop using the CHG and notify one of our RNs at [phone].   Please shower with the CHG soap starting 4 days before surgery using the following schedule:     Please keep in mind the following:  DO NOT shave, including legs and underarms, starting the day of your first shower.   You may shave your face at any point before/day of surgery.  Place clean sheets on your bed the day you start using CHG soap. Use a clean washcloth (not used since being washed) for each shower. DO NOT sleep with pets once you start using the CHG.   CHG Shower Instructions:  If you choose to wash your hair and private area, wash first with your normal shampoo/soap.  After you use shampoo/soap, rinse your hair and body thoroughly to remove shampoo/soap residue.  Turn the water OFF and apply about 3 tablespoons (45 ml) of CHG soap to a CLEAN washcloth.  Apply CHG soap ONLY FROM YOUR NECK DOWN TO YOUR TOES (washing for 3-5 minutes)  DO NOT use CHG soap on face, private areas, open wounds, or sores.  Pay special attention to the area where your surgery is being performed.  If you are having back surgery, having someone wash your back for you may be helpful. Wait 2 minutes after CHG soap is applied, then you may rinse off the CHG soap.  Pat dry with a clean towel  Put on clean clothes/pajamas   If you choose to wear lotion, please use ONLY the CHG-compatible lotions on the back of this paper.     Additional instructions for  the day of surgery: DO NOT APPLY any lotions, deodorants, cologne, or perfumes.   Put on clean/comfortable clothes.  Brush your teeth.  Ask your nurse before applying any prescription medications to the skin.      CHG Compatible Lotions   Aveeno Moisturizing lotion  Cetaphil Moisturizing Cream  Cetaphil Moisturizing Lotion  Clairol Herbal Essence Moisturizing Lotion, Dry Skin  Clairol Herbal Essence Moisturizing Lotion, Extra Dry Skin  Clairol Herbal Essence Moisturizing Lotion, Normal Skin  Curel Age Defying Therapeutic Moisturizing Lotion with Alpha Hydroxy  Curel Extreme Care Body Lotion  Curel Soothing Hands Moisturizing Hand Lotion  Curel Therapeutic Moisturizing Cream, Fragrance-Free  Curel Therapeutic Moisturizing Lotion, Fragrance-Free  Curel Therapeutic Moisturizing Lotion, Original Formula  Eucerin Daily Replenishing Lotion  Eucerin Dry Skin Therapy Plus Alpha Hydroxy Crme  Eucerin Dry Skin Therapy Plus Alpha Hydroxy Lotion  Eucerin Original Crme  Eucerin Original Lotion  Eucerin Plus Crme Eucerin Plus Lotion  Eucerin TriLipid Replenishing Lotion  Keri Anti-Bacterial Hand Lotion  Keri Deep Conditioning Original Lotion Dry Skin Formula Softly Scented  Keri Deep Conditioning Original Lotion, Fragrance Free Sensitive Skin Formula  Keri Lotion Fast Absorbing Fragrance Free Sensitive Skin Formula  Keri Lotion Fast Absorbing Softly Scented Dry Skin Formula  Keri Original Lotion  Keri Skin Renewal Lotion Keri Silky Smooth Lotion  Keri Silky Smooth Sensitive Skin Lotion    Nivea Body Creamy Conditioning Oil  Nivea Body Extra Enriched Lotion  Nivea Body Original Lotion  Nivea Body Sheer Moisturizing Lotion Nivea Crme  Nivea Skin Firming Lotion  NutraDerm 30 Skin Lotion  NutraDerm Skin Lotion  NutraDerm Therapeutic Skin Cream  NutraDerm Therapeutic Skin Lotion  ProShield Protective Hand Cream  Provon moisturizing lotion   

## 2023-05-03 NOTE — Telephone Encounter (Signed)
Called and let patient know that handicap application is up front for pick up.

## 2023-05-06 ENCOUNTER — Other Ambulatory Visit: Payer: Self-pay | Admitting: Physician Assistant

## 2023-05-06 MED ORDER — TIZANIDINE HCL 4 MG PO TABS: 4.0000 mg | ORAL_TABLET | Freq: Two times a day (BID) | ORAL | 2 refills | Status: DC | PRN

## 2023-05-06 MED ORDER — ASPIRIN 81 MG PO TBEC: 81.0000 mg | DELAYED_RELEASE_TABLET | Freq: Two times a day (BID) | ORAL | 0 refills | Status: DC

## 2023-05-06 MED ORDER — OXYCODONE-ACETAMINOPHEN 5-325 MG PO TABS
1.0000 | ORAL_TABLET | Freq: Four times a day (QID) | ORAL | 0 refills | Status: DC | PRN
Start: 2023-05-06 — End: 2023-05-20

## 2023-05-06 MED ORDER — ONDANSETRON HCL 4 MG PO TABS: 4.0000 mg | ORAL_TABLET | Freq: Three times a day (TID) | ORAL | 0 refills | Status: DC | PRN

## 2023-05-06 MED ORDER — DOCUSATE SODIUM 100 MG PO CAPS: 100.0000 mg | ORAL_CAPSULE | Freq: Every day | ORAL | 2 refills | Status: DC | PRN

## 2023-05-06 NOTE — Progress Notes (Signed)
Anesthesia Chart Review:  79 female with pertinent history including HTN, LBBB, nonobstructive CAD, hiatal hernia, migraines.  She had previous evaluation of choreal pain with cardiologist Dr. Odis Hollingshead in 2022.  Per notes, pain was felt likely to be noncardiac in nature; however, due to multiple cardiovascular risk factors he was decided to proceed with coronary CTA for further risk stratification.  Imaging showed total coronary calcium score of 0 and mild nonobstructive disease in the LAD due to noncalcified plaque.  Echo showed normal LV systolic function, grade 1 DD, no significant valvular disease.  She was recommended to continue risk factor reduction.  Advised to follow-up with her PCP to discuss noncardiac causes of precordial pain.  Preop labs reviewed, WNL.  EKG 05/03/2023: NSR.  Rate 66.  T wave abnormality, consider anterior ischemia.  Compared to previous EKG, left bundle branch block is no longer present.  Anterior T wave normality has been present on prior EKGs.  Echocardiogram: 12/01/2020: Left ventricle cavity is normal in size and wall thickness. Normal global wall motion. Normal LV systolic function with EF 64%. Doppler evidence of grade I (impaired) diastolic dysfunction, normal LAP.  No significant valvular abnormality.  No evidence of pulmonary hypertension.  CCTA 11/30/2020 Total coronary calcium score of 0. Normal coronary origin with right-dominance.  CAD-RADS = 2. Mild stenosis (25-49%) at the mid LAD due to noncalcified plaque. Aortic atherosclerosis. No significant incidental noncardiac findings are noted.     Zannie Cove [facility] Short Stay Center/Anesthesiology Phone [phone] 05/06/2023 11:40 AM

## 2023-05-06 NOTE — Anesthesia Preprocedure Evaluation (Addendum)
Anesthesia Evaluation  Patient identified by MRN, date of birth, ID band Patient awake    Reviewed: Allergy & Precautions, H&P , NPO status , Patient's Chart, lab work & pertinent test results  Airway Mallampati: III  TM Distance: >3 FB Neck ROM: Full    Dental no notable dental hx. (+) Teeth Intact, Dental Advisory Given   Pulmonary neg pulmonary ROS   Pulmonary exam normal breath sounds clear to auscultation       Cardiovascular hypertension, Pt. on medications + CAD  + dysrhythmias  Rhythm:Regular Rate:Normal     Neuro/Psych  Headaches  negative psych ROS   GI/Hepatic Neg liver ROS, hiatal hernia,,,  Endo/Other  negative endocrine ROS    Renal/GU negative Renal ROS  negative genitourinary   Musculoskeletal  (+) Arthritis , Osteoarthritis,    Abdominal   Peds  Hematology negative hematology ROS (+)   Anesthesia Other Findings   Reproductive/Obstetrics negative OB ROS                             Anesthesia Physical Anesthesia Plan  ASA: 2  Anesthesia Plan: Spinal   Post-op Pain Management: Tylenol PO (pre-op)*   Induction: Intravenous  PONV Risk Score and Plan: 3 and Ondansetron, Propofol infusion and Dexamethasone  Airway Management Planned: Natural Airway and Simple Face Mask  Additional Equipment:   Intra-op Plan:   Post-operative Plan:   Informed Consent: I have reviewed the patients History and Physical, chart, labs and discussed the procedure including the risks, benefits and alternatives for the proposed anesthesia with the patient or authorized representative who has indicated his/her understanding and acceptance.     Dental advisory given  Plan Discussed with: CRNA  Anesthesia Plan Comments: (PAT note by Antionette Poles, PA-C: 23 female with pertinent history including HTN, LBBB, nonobstructive CAD, hiatal hernia, migraines.  She had previous evaluation of  choreal pain with cardiologist Dr. Odis Hollingshead in 2022.  Per notes, pain was felt likely to be noncardiac in nature; however, due to multiple cardiovascular risk factors he was decided to proceed with coronary CTA for further risk stratification.  Imaging showed total coronary calcium score of 0 and mild nonobstructive disease in the LAD due to noncalcified plaque.  Echo showed normal LV systolic function, grade 1 DD, no significant valvular disease.  She was recommended to continue risk factor reduction.  Advised to follow-up with her PCP to discuss noncardiac causes of precordial pain.  Preop labs reviewed, WNL.  EKG 05/03/2023: NSR.  Rate 66.  T wave abnormality, consider anterior ischemia.  Compared to previous EKG, left bundle branch block is no longer present.  Anterior T wave normality has been present on prior EKGs.  Echocardiogram: 12/01/2020: Left ventricle cavity is normal in size and wall thickness. Normal global wall motion. Normal LV systolic function with EF 64%. Doppler evidence of grade I (impaired) diastolic dysfunction, normal LAP.  No significant valvular abnormality.  No evidence of pulmonary hypertension.  CCTA 11/30/2020 Total coronary calcium score of 0. Normal coronary origin with right-dominance. CAD-RADS = 2. Mild stenosis (25-49%) at the mid LAD due to noncalcified plaque. Aortic atherosclerosis. No significant incidental noncardiac findings are noted.    )        Anesthesia Quick Evaluation

## 2023-05-10 MED ORDER — TRANEXAMIC ACID 1000 MG/10ML IV SOLN
2000.0000 mg | INTRAVENOUS | Status: AC
  Filled 2023-05-10: qty 20

## 2023-05-10 NOTE — Progress Notes (Signed)
Patient was called to be informed that the surgery time for Monday was changed to 09:00 o'clock. Patient was instructed to be at the hospital at 06:30 o'clock and finish the drink at 06:00 o'clock. Patient verbalized understanding.

## 2023-05-12 DIAGNOSIS — M1611 Unilateral primary osteoarthritis, right hip: Principal | ICD-10-CM | POA: Insufficient documentation

## 2023-05-13 ENCOUNTER — Encounter (HOSPITAL_COMMUNITY)
Admission: RE | Disposition: A | Discharge status: Home / Self Care | Payer: Self-pay | Source: Home / Self Care | Attending: Orthopaedic Surgery

## 2023-05-13 ENCOUNTER — Ambulatory Visit (HOSPITAL_COMMUNITY): Payer: Medicare Other

## 2023-05-13 ENCOUNTER — Other Ambulatory Visit: Payer: Self-pay

## 2023-05-13 ENCOUNTER — Observation Stay (HOSPITAL_COMMUNITY): Payer: Medicare Other

## 2023-05-13 ENCOUNTER — Encounter (HOSPITAL_COMMUNITY): Payer: Self-pay | Admitting: Orthopaedic Surgery

## 2023-05-13 ENCOUNTER — Ambulatory Visit (HOSPITAL_COMMUNITY): Payer: Medicare Other | Admitting: Physician Assistant

## 2023-05-13 ENCOUNTER — Observation Stay (HOSPITAL_COMMUNITY)
Admission: RE | Admit: 2023-05-13 | Discharge: 2023-05-14 | Disposition: A | Discharge status: Home / Self Care | Payer: Medicare Other | Attending: Orthopaedic Surgery | Admitting: Orthopaedic Surgery

## 2023-05-13 DIAGNOSIS — M1611 Unilateral primary osteoarthritis, right hip: Secondary | ICD-10-CM

## 2023-05-13 DIAGNOSIS — I10 Essential (primary) hypertension: Secondary | ICD-10-CM | POA: Insufficient documentation

## 2023-05-13 DIAGNOSIS — Z79899 Other long term (current) drug therapy: Secondary | ICD-10-CM | POA: Insufficient documentation

## 2023-05-13 DIAGNOSIS — Z91040 Latex allergy status: Secondary | ICD-10-CM | POA: Diagnosis not present

## 2023-05-13 DIAGNOSIS — Z96641 Presence of right artificial hip joint: Secondary | ICD-10-CM

## 2023-05-13 DIAGNOSIS — Z7982 Long term (current) use of aspirin: Secondary | ICD-10-CM | POA: Insufficient documentation

## 2023-05-13 HISTORY — PX: TOTAL HIP ARTHROPLASTY: SHX124

## 2023-05-13 LAB — ABO/RH: ABO/RH(D): O POS

## 2023-05-13 SURGERY — ARTHROPLASTY, HIP, TOTAL, ANTERIOR APPROACH
Anesthesia: Spinal | Site: Hip | Laterality: Right

## 2023-05-13 MED ORDER — METHOCARBAMOL 1000 MG/10ML IJ SOLN: 500.0000 mg | Freq: Four times a day (QID) | INTRAVENOUS | Status: DC | PRN

## 2023-05-13 MED ORDER — METOCLOPRAMIDE HCL 5 MG PO TABS: 5.0000 mg | ORAL_TABLET | Freq: Three times a day (TID) | ORAL | Status: DC | PRN

## 2023-05-13 MED ORDER — PHENOL 1.4 % MT LIQD: 1.0000 | OROMUCOSAL | Status: DC | PRN

## 2023-05-13 MED ORDER — PRONTOSAN WOUND IRRIGATION OPTIME
TOPICAL | Status: DC | PRN
  Administered 2023-05-13: 1

## 2023-05-13 MED ORDER — DIPHENHYDRAMINE HCL 12.5 MG/5ML PO ELIX: 25.0000 mg | ORAL_SOLUTION | ORAL | Status: DC | PRN

## 2023-05-13 MED ORDER — PROPOFOL 500 MG/50ML IV EMUL
INTRAVENOUS | Status: DC | PRN
Start: 2023-05-13 — End: 2023-05-13
  Administered 2023-05-13: 50 ug/kg/min via INTRAVENOUS

## 2023-05-13 MED ORDER — PROPOFOL 1000 MG/100ML IV EMUL
INTRAVENOUS | Status: AC
  Filled 2023-05-13: qty 100

## 2023-05-13 MED ORDER — SODIUM CHLORIDE 0.9 % IR SOLN
Status: DC | PRN
  Administered 2023-05-13: 1000 mL

## 2023-05-13 MED ORDER — PROPOFOL 10 MG/ML IV BOLUS
INTRAVENOUS | Status: DC | PRN
  Administered 2023-05-13: 20 mg via INTRAVENOUS

## 2023-05-13 MED ORDER — LIDOCAINE 2% (20 MG/ML) 5 ML SYRINGE
INTRAMUSCULAR | Status: DC | PRN
  Administered 2023-05-13: 40 mg via INTRAVENOUS

## 2023-05-13 MED ORDER — ASPIRIN 81 MG PO CHEW
81.0000 mg | CHEWABLE_TABLET | Freq: Two times a day (BID) | ORAL | Status: DC
  Administered 2023-05-13 – 2023-05-14 (×2): 81 mg via ORAL
  Filled 2023-05-13 (×2): qty 1

## 2023-05-13 MED ORDER — LACTATED RINGERS IV SOLN: INTRAVENOUS | Status: DC

## 2023-05-13 MED ORDER — ACETAMINOPHEN 325 MG PO TABS: 325.0000 mg | ORAL_TABLET | Freq: Four times a day (QID) | ORAL | Status: DC | PRN

## 2023-05-13 MED ORDER — PHENYLEPHRINE HCL-NACL 20-0.9 MG/250ML-% IV SOLN
INTRAVENOUS | Status: DC | PRN
  Administered 2023-05-13: 40 ug/min via INTRAVENOUS

## 2023-05-13 MED ORDER — CEFAZOLIN SODIUM-DEXTROSE 2-4 GM/100ML-% IV SOLN
2.0000 g | INTRAVENOUS | Status: AC
  Administered 2023-05-13: 2 g via INTRAVENOUS
  Filled 2023-05-13: qty 100

## 2023-05-13 MED ORDER — ONDANSETRON HCL 4 MG/2ML IJ SOLN
INTRAMUSCULAR | Status: DC | PRN
Start: 2023-05-13 — End: 2023-05-13
  Administered 2023-05-13: 4 mg via INTRAVENOUS

## 2023-05-13 MED ORDER — POVIDONE-IODINE 10 % EX SWAB
2.0000 | Freq: Once | CUTANEOUS | Status: AC
  Administered 2023-05-13: 2 via TOPICAL

## 2023-05-13 MED ORDER — OXYCODONE HCL 5 MG PO TABS: 10.0000 mg | ORAL_TABLET | ORAL | Status: DC | PRN

## 2023-05-13 MED ORDER — BUPIVACAINE IN DEXTROSE 0.75-8.25 % IT SOLN
INTRATHECAL | Status: DC | PRN
Start: 2023-05-13 — End: 2023-05-13
  Administered 2023-05-13: 1.6 mL via INTRATHECAL

## 2023-05-13 MED ORDER — HYDROXYZINE HCL 50 MG/ML IM SOLN
50.0000 mg | Freq: Four times a day (QID) | INTRAMUSCULAR | Status: DC | PRN
  Administered 2023-05-13: 50 mg via INTRAMUSCULAR
  Filled 2023-05-13: qty 1

## 2023-05-13 MED ORDER — AMISULPRIDE (ANTIEMETIC) 5 MG/2ML IV SOLN
INTRAVENOUS | Status: AC
  Filled 2023-05-13: qty 2

## 2023-05-13 MED ORDER — DEXAMETHASONE SODIUM PHOSPHATE 10 MG/ML IJ SOLN
INTRAMUSCULAR | Status: AC
  Filled 2023-05-13: qty 1

## 2023-05-13 MED ORDER — OXYCODONE HCL ER 10 MG PO T12A
10.0000 mg | EXTENDED_RELEASE_TABLET | Freq: Two times a day (BID) | ORAL | Status: DC
  Administered 2023-05-13 (×2): 10 mg via ORAL
  Filled 2023-05-13 (×2): qty 1

## 2023-05-13 MED ORDER — ONDANSETRON HCL 4 MG PO TABS: 4.0000 mg | ORAL_TABLET | Freq: Four times a day (QID) | ORAL | Status: DC | PRN

## 2023-05-13 MED ORDER — TRANEXAMIC ACID-NACL 1000-0.7 MG/100ML-% IV SOLN
1000.0000 mg | INTRAVENOUS | Status: AC
  Administered 2023-05-13: 1000 mg via INTRAVENOUS
  Filled 2023-05-13: qty 100

## 2023-05-13 MED ORDER — ALUM & MAG HYDROXIDE-SIMETH 200-200-20 MG/5ML PO SUSP: 30.0000 mL | ORAL | Status: DC | PRN

## 2023-05-13 MED ORDER — SORBITOL 70 % SOLN: 30.0000 mL | Freq: Every day | Status: DC | PRN

## 2023-05-13 MED ORDER — AMISULPRIDE (ANTIEMETIC) 5 MG/2ML IV SOLN
5.0000 mg | Freq: Once | INTRAVENOUS | Status: AC
  Administered 2023-05-13: 5 mg via INTRAVENOUS

## 2023-05-13 MED ORDER — DEXAMETHASONE SODIUM PHOSPHATE 10 MG/ML IJ SOLN
INTRAMUSCULAR | Status: DC | PRN
  Administered 2023-05-13: 10 mg via INTRAVENOUS

## 2023-05-13 MED ORDER — SODIUM CHLORIDE 0.9 % IV SOLN: INTRAVENOUS | Status: DC

## 2023-05-13 MED ORDER — METHOCARBAMOL 500 MG PO TABS
500.0000 mg | ORAL_TABLET | Freq: Four times a day (QID) | ORAL | Status: DC | PRN
  Administered 2023-05-13 – 2023-05-14 (×3): 500 mg via ORAL
  Filled 2023-05-13 (×3): qty 1

## 2023-05-13 MED ORDER — VANCOMYCIN HCL 1000 MG IV SOLR
INTRAVENOUS | Status: AC
  Filled 2023-05-13: qty 20

## 2023-05-13 MED ORDER — CHLORHEXIDINE GLUCONATE 0.12 % MT SOLN
15.0000 mL | Freq: Once | OROMUCOSAL | Status: AC
  Administered 2023-05-13: 15 mL via OROMUCOSAL
  Filled 2023-05-13: qty 15

## 2023-05-13 MED ORDER — PANTOPRAZOLE SODIUM 40 MG PO TBEC
40.0000 mg | DELAYED_RELEASE_TABLET | Freq: Every day | ORAL | Status: DC
  Administered 2023-05-13 – 2023-05-14 (×2): 40 mg via ORAL
  Filled 2023-05-13 (×2): qty 1

## 2023-05-13 MED ORDER — AMLODIPINE BESYLATE 5 MG PO TABS
5.0000 mg | ORAL_TABLET | Freq: Every day | ORAL | Status: DC
  Administered 2023-05-14: 5 mg via ORAL
  Filled 2023-05-13: qty 1

## 2023-05-13 MED ORDER — ACETAMINOPHEN 500 MG PO TABS
ORAL_TABLET | ORAL | Status: AC
  Filled 2023-05-13: qty 2

## 2023-05-13 MED ORDER — FERROUS SULFATE 325 (65 FE) MG PO TABS
325.0000 mg | ORAL_TABLET | Freq: Three times a day (TID) | ORAL | Status: DC
  Administered 2023-05-14: 325 mg via ORAL
  Filled 2023-05-13: qty 1

## 2023-05-13 MED ORDER — CEFAZOLIN SODIUM-DEXTROSE 2-4 GM/100ML-% IV SOLN
2.0000 g | Freq: Four times a day (QID) | INTRAVENOUS | Status: AC
  Administered 2023-05-13 (×2): 2 g via INTRAVENOUS
  Filled 2023-05-13 (×2): qty 100

## 2023-05-13 MED ORDER — MIDAZOLAM HCL 2 MG/2ML IJ SOLN
INTRAMUSCULAR | Status: DC | PRN
  Administered 2023-05-13: 1 mg via INTRAVENOUS
  Administered 2023-05-13: .5 mg via INTRAVENOUS

## 2023-05-13 MED ORDER — TRANEXAMIC ACID 1000 MG/10ML IV SOLN
INTRAVENOUS | Status: DC | PRN
  Administered 2023-05-13: 2000 mg via TOPICAL

## 2023-05-13 MED ORDER — BUPIVACAINE-MELOXICAM ER 400-12 MG/14ML IJ SOLN
INTRAMUSCULAR | Status: AC
  Filled 2023-05-13: qty 1

## 2023-05-13 MED ORDER — POLYETHYLENE GLYCOL 3350 17 G PO PACK
17.0000 g | PACK | Freq: Every day | ORAL | Status: DC
  Administered 2023-05-14: 17 g via ORAL
  Filled 2023-05-13: qty 1

## 2023-05-13 MED ORDER — FENTANYL CITRATE (PF) 100 MCG/2ML IJ SOLN
INTRAMUSCULAR | Status: AC
  Filled 2023-05-13: qty 2

## 2023-05-13 MED ORDER — PHENYLEPHRINE 80 MCG/ML (10ML) SYRINGE FOR IV PUSH (FOR BLOOD PRESSURE SUPPORT)
PREFILLED_SYRINGE | INTRAVENOUS | Status: AC
  Filled 2023-05-13: qty 10

## 2023-05-13 MED ORDER — 0.9 % SODIUM CHLORIDE (POUR BTL) OPTIME
TOPICAL | Status: DC | PRN
  Administered 2023-05-13: 1000 mL

## 2023-05-13 MED ORDER — FENTANYL CITRATE (PF) 250 MCG/5ML IJ SOLN
INTRAMUSCULAR | Status: DC | PRN
  Administered 2023-05-13: 25 ug via INTRAVENOUS
  Administered 2023-05-13: 50 ug via INTRAVENOUS
  Administered 2023-05-13 (×2): 25 ug via INTRAVENOUS

## 2023-05-13 MED ORDER — PHENYLEPHRINE 80 MCG/ML (10ML) SYRINGE FOR IV PUSH (FOR BLOOD PRESSURE SUPPORT)
PREFILLED_SYRINGE | INTRAVENOUS | Status: DC | PRN
  Administered 2023-05-13 (×2): 80 ug via INTRAVENOUS

## 2023-05-13 MED ORDER — ONDANSETRON HCL 4 MG/2ML IJ SOLN
INTRAMUSCULAR | Status: AC
  Filled 2023-05-13: qty 2

## 2023-05-13 MED ORDER — ORAL CARE MOUTH RINSE: 15.0000 mL | Freq: Once | OROMUCOSAL | Status: AC

## 2023-05-13 MED ORDER — BUPIVACAINE-MELOXICAM ER 400-12 MG/14ML IJ SOLN
INTRAMUSCULAR | Status: DC | PRN
  Administered 2023-05-13: 400 mg

## 2023-05-13 MED ORDER — MENTHOL 3 MG MT LOZG: 1.0000 | LOZENGE | OROMUCOSAL | Status: DC | PRN

## 2023-05-13 MED ORDER — LOSARTAN POTASSIUM 50 MG PO TABS
50.0000 mg | ORAL_TABLET | Freq: Every day | ORAL | Status: DC
  Administered 2023-05-13: 50 mg via ORAL
  Filled 2023-05-13: qty 1

## 2023-05-13 MED ORDER — MIDAZOLAM HCL 2 MG/2ML IJ SOLN
INTRAMUSCULAR | Status: AC
  Filled 2023-05-13: qty 2

## 2023-05-13 MED ORDER — ONDANSETRON HCL 4 MG/2ML IJ SOLN: 4.0000 mg | Freq: Four times a day (QID) | INTRAMUSCULAR | Status: DC | PRN

## 2023-05-13 MED ORDER — FENTANYL CITRATE (PF) 250 MCG/5ML IJ SOLN
INTRAMUSCULAR | Status: AC
  Filled 2023-05-13: qty 5

## 2023-05-13 MED ORDER — DOCUSATE SODIUM 100 MG PO CAPS
100.0000 mg | ORAL_CAPSULE | Freq: Two times a day (BID) | ORAL | Status: DC
  Administered 2023-05-13 – 2023-05-14 (×2): 100 mg via ORAL
  Filled 2023-05-13 (×2): qty 1

## 2023-05-13 MED ORDER — FENTANYL CITRATE (PF) 100 MCG/2ML IJ SOLN
25.0000 ug | INTRAMUSCULAR | Status: DC | PRN
  Administered 2023-05-13: 25 ug via INTRAVENOUS

## 2023-05-13 MED ORDER — ACETAMINOPHEN 500 MG PO TABS
1000.0000 mg | ORAL_TABLET | Freq: Four times a day (QID) | ORAL | Status: AC
  Administered 2023-05-13 – 2023-05-14 (×4): 1000 mg via ORAL
  Filled 2023-05-13 (×5): qty 2

## 2023-05-13 MED ORDER — MAGNESIUM CITRATE PO SOLN: 1.0000 | Freq: Once | ORAL | Status: DC | PRN

## 2023-05-13 MED ORDER — OXYCODONE HCL 5 MG PO TABS
5.0000 mg | ORAL_TABLET | ORAL | Status: DC | PRN
  Administered 2023-05-13: 5 mg via ORAL
  Administered 2023-05-14: 10 mg via ORAL
  Administered 2023-05-14: 5 mg via ORAL
  Filled 2023-05-13 (×3): qty 1
  Filled 2023-05-13: qty 2

## 2023-05-13 MED ORDER — PROPOFOL 10 MG/ML IV BOLUS
INTRAVENOUS | Status: AC
  Filled 2023-05-13: qty 20

## 2023-05-13 MED ORDER — METOCLOPRAMIDE HCL 5 MG/ML IJ SOLN: 5.0000 mg | Freq: Three times a day (TID) | INTRAMUSCULAR | Status: DC | PRN

## 2023-05-13 MED ORDER — LIDOCAINE 2% (20 MG/ML) 5 ML SYRINGE
INTRAMUSCULAR | Status: AC
  Filled 2023-05-13: qty 5

## 2023-05-13 MED ORDER — DEXAMETHASONE SODIUM PHOSPHATE 10 MG/ML IJ SOLN
10.0000 mg | Freq: Once | INTRAMUSCULAR | Status: AC
  Administered 2023-05-14: 10 mg via INTRAVENOUS
  Filled 2023-05-13: qty 1

## 2023-05-13 MED ORDER — HYDROMORPHONE HCL 1 MG/ML IJ SOLN: 0.5000 mg | INTRAMUSCULAR | Status: DC | PRN

## 2023-05-13 MED ORDER — VANCOMYCIN HCL 1 G IV SOLR
INTRAVENOUS | Status: DC | PRN
  Administered 2023-05-13: 1000 mg via TOPICAL

## 2023-05-13 MED ORDER — TRANEXAMIC ACID-NACL 1000-0.7 MG/100ML-% IV SOLN
1000.0000 mg | Freq: Once | INTRAVENOUS | Status: AC
  Administered 2023-05-13: 1000 mg via INTRAVENOUS
  Filled 2023-05-13: qty 100

## 2023-05-13 MED ORDER — ACETAMINOPHEN 500 MG PO TABS
1000.0000 mg | ORAL_TABLET | Freq: Once | ORAL | Status: AC
  Administered 2023-05-13: 1000 mg via ORAL

## 2023-05-13 SURGICAL SUPPLY — 72 items
ACETAB CUP W/GRIPTION 54 (Plate) ×1 IMPLANT
ADH SKN CLS APL DERMABOND .7 (GAUZE/BANDAGES/DRESSINGS) ×1
BAG COUNTER SPONGE SURGICOUNT (BAG) ×1 IMPLANT
BAG DECANTER FOR FLEXI CONT (MISCELLANEOUS) ×1 IMPLANT
BAG SPNG CNTER NS LX DISP (BAG) ×1
BLADE SAG 18X100X1.27 (BLADE) ×1 IMPLANT
CNTNR URN SCR LID CUP LEK RST (MISCELLANEOUS) IMPLANT
CONT SPEC 4OZ STRL OR WHT (MISCELLANEOUS) ×1
COVER PERINEAL POST (MISCELLANEOUS) ×1 IMPLANT
COVER SURGICAL LIGHT HANDLE (MISCELLANEOUS) ×1 IMPLANT
CUP ACETAB W/GRIPTION 54 (Plate) IMPLANT
DERMABOND ADVANCED .7 DNX12 (GAUZE/BANDAGES/DRESSINGS) IMPLANT
DRAPE C-ARM 42X72 X-RAY (DRAPES) ×1 IMPLANT
DRAPE POUCH INSTRU U-SHP 10X18 (DRAPES) ×1 IMPLANT
DRAPE STERI IOBAN 125X83 (DRAPES) ×1 IMPLANT
DRAPE U-SHAPE 47X51 STRL (DRAPES) ×2 IMPLANT
DRSG AQUACEL AG ADV 3.5X10 (GAUZE/BANDAGES/DRESSINGS) ×1 IMPLANT
DRSG XEROFORM 1X8 (GAUZE/BANDAGES/DRESSINGS) IMPLANT
DURAPREP 26ML APPLICATOR (WOUND CARE) ×2 IMPLANT
ELECT BLADE 4.0 EZ CLEAN MEGAD (MISCELLANEOUS) ×1
ELECT REM PT RETURN 9FT ADLT (ELECTROSURGICAL) ×1
ELECTRODE BLDE 4.0 EZ CLN MEGD (MISCELLANEOUS) ×1 IMPLANT
ELECTRODE REM PT RTRN 9FT ADLT (ELECTROSURGICAL) ×1 IMPLANT
GLOVE BIOGEL PI IND STRL 7.0 (GLOVE) ×2 IMPLANT
GLOVE BIOGEL PI IND STRL 7.5 (GLOVE) ×5 IMPLANT
GLOVE ECLIPSE 7.0 STRL STRAW (GLOVE) ×2 IMPLANT
GLOVE SKINSENSE STRL SZ7.5 (GLOVE) ×1 IMPLANT
GLOVE SURG SYN 7.5 E (GLOVE) ×4
GLOVE SURG SYN 7.5 PF PI (GLOVE) ×2 IMPLANT
GLOVE SURG UNDER POLY LF SZ7 (GLOVE) ×3 IMPLANT
GLOVE SURG UNDER POLY LF SZ7.5 (GLOVE) ×2 IMPLANT
GOWN STRL REUS W/ TWL LRG LVL3 (GOWN DISPOSABLE) IMPLANT
GOWN STRL REUS W/ TWL XL LVL3 (GOWN DISPOSABLE) ×1 IMPLANT
GOWN STRL REUS W/TWL LRG LVL3 (GOWN DISPOSABLE)
GOWN STRL REUS W/TWL XL LVL3 (GOWN DISPOSABLE) ×2
GOWN STRL SURGICAL XL XLNG (GOWN DISPOSABLE) ×1 IMPLANT
GOWN TOGA ZIPPER T7+ PEEL AWAY (MISCELLANEOUS) ×2 IMPLANT
HANDPIECE INTERPULSE COAX TIP (DISPOSABLE) ×1
HEAD CERAMIC DELTA 36 PLUS 1.5 (Hips) IMPLANT
HOOD PEEL AWAY T7 (MISCELLANEOUS) ×1 IMPLANT
IV NS IRRIG 3000ML ARTHROMATIC (IV SOLUTION) ×1 IMPLANT
KIT BASIN OR (CUSTOM PROCEDURE TRAY) ×1 IMPLANT
LINER NEUTRAL 54X36MM PLUS 4 (Hips) IMPLANT
MARKER SKIN DUAL TIP RULER LAB (MISCELLANEOUS) ×1 IMPLANT
NDL SPNL 18GX3.5 QUINCKE PK (NEEDLE) ×1 IMPLANT
NEEDLE SPNL 18GX3.5 QUINCKE PK (NEEDLE) ×1
PACK TOTAL JOINT (CUSTOM PROCEDURE TRAY) ×1 IMPLANT
PACK UNIVERSAL I (CUSTOM PROCEDURE TRAY) ×1 IMPLANT
SCREW 6.5MMX25MM (Screw) IMPLANT
SET HNDPC FAN SPRY TIP SCT (DISPOSABLE) ×1 IMPLANT
SOLUTION PRONTOSAN WOUND 350ML (IRRIGATION / IRRIGATOR) ×1 IMPLANT
STAPLER VISISTAT 35W (STAPLE) IMPLANT
STEM FEM ACTIS STD SZ2 (Stem) IMPLANT
SUT ETHIBOND 2 V 37 (SUTURE) ×1 IMPLANT
SUT ETHILON 2 0 FS 18 (SUTURE) IMPLANT
SUT VIC AB 0 CT1 27 (SUTURE) ×1
SUT VIC AB 0 CT1 27XBRD ANBCTR (SUTURE) ×1 IMPLANT
SUT VIC AB 1 CTX 36 (SUTURE) ×1
SUT VIC AB 1 CTX36XBRD ANBCTR (SUTURE) ×1 IMPLANT
SUT VIC AB 2-0 CT1 27 (SUTURE) ×2
SUT VIC AB 2-0 CT1 TAPERPNT 27 (SUTURE) ×2 IMPLANT
SYR 30ML LL (SYRINGE) IMPLANT
SYR 50ML LL SCALE MARK (SYRINGE) ×1 IMPLANT
TOWEL GREEN STERILE (TOWEL DISPOSABLE) ×1 IMPLANT
TRAY CATH INTERMITTENT SS 16FR (CATHETERS) IMPLANT
TRAY FOL W/BAG SLVR 16FR STRL (SET/KITS/TRAYS/PACK) IMPLANT
TRAY FOLEY W/BAG SLVR 16FR (SET/KITS/TRAYS/PACK)
TRAY FOLEY W/BAG SLVR 16FR LF (SET/KITS/TRAYS/PACK) ×1
TRAY FOLEY W/BAG SLVR 16FR ST (SET/KITS/TRAYS/PACK) IMPLANT
TUBE SUCT ARGYLE STRL (TUBING) ×1 IMPLANT
WARMER LAPAROSCOPE (MISCELLANEOUS) IMPLANT
YANKAUER SUCT BULB TIP NO VENT (SUCTIONS) ×1 IMPLANT

## 2023-05-13 NOTE — H&P (Signed)
PREOPERATIVE H&P  Chief Complaint: right hip osteoarthritis  HPI: Cheryl Barnes is a 72 y.o. female who presents for surgical treatment of right hip osteoarthritis.  She denies any changes in medical history.  Past Surgical History:  Procedure Laterality Date   CESAREAN SECTION     x2   CHOLECYSTECTOMY     PARTIAL HYSTERECTOMY     Abdominal   ROTATOR CUFF REPAIR Left    ROTATOR CUFF REPAIR Right    TONSILLECTOMY     removed as a child   TYMPANOSTOMY TUBE PLACEMENT Right 2022   Social History   Socioeconomic History   Marital status: Single    Spouse name: Not on file   Number of children: 2   Years of education: Not on file   Highest education level: Not on file  Occupational History   Occupation: ophthalmology @ [employer]    Employer: [employer]  Tobacco Use   Smoking status: Never   Smokeless tobacco: Never  Vaping Use   Vaping status: Never Used  Substance and Sexual Activity   Alcohol use: No   Drug use: No   Sexual activity: Never  Other Topics Concern   Not on file  Social History Narrative   Not on file   Social Determinants of Health   Financial Resource Strain: Not on file  Food Insecurity: Not on file  Transportation Needs: Not on file  Physical Activity: Not on file  Stress: Not on file  Social Connections: Not on file   Family History  Problem Relation Age of Onset   Hypertension Father    Diabetes Father        adult onset   Colon cancer Father    Hypertension Mother    Breast cancer Mother        breast   Cancer Mother        breast   Asthma Maternal Grandfather    Hypertension Brother    Diabetes Brother    Stomach cancer Neg Hx    Pancreatic cancer Neg Hx    Allergies  Allergen Reactions   Penicillins Itching and Rash    Has patient had a PCN reaction causing immediate rash, facial/tongue/throat swelling, SOB or lightheadedness with hypotension: Yes Has patient had a PCN reaction causing severe rash involving mucus  membranes or skin necrosis: No Has patient had a PCN reaction that required hospitalization No Has patient had a PCN reaction occurring within the last 10 years: No If all of the above answers are "NO", then may proceed with Cephalosporin use.    Seasonal Ic [Cholestatin] Other (See Comments)    Upper resp, sore throat, sneezing, etc.   Tape Other (See Comments)    Tape causes some redness & causes BLISTERS if left on for too long   Latex Itching and Rash   Prior to Admission medications   Medication Sig Start Date End Date Taking? Authorizing Provider  acetaminophen (TYLENOL) 500 MG tablet Take 1,000 mg by mouth every 6 (six) hours as needed (joint pain/headache).   Yes [provider]  amLODipine (NORVASC) 5 MG tablet TAKE 1 TABLET DAILY 11/13/22  Yes [place], [name], MD  cetirizine (ZYRTEC) 10 MG tablet Take 10 mg by mouth every evening.   Yes [provider]  Homeopathic Products (CLEAR TINNITUS) CAPS Take 1 capsule by mouth daily after supper. Silencil Max for Tinnitus   Yes [provider]  ibuprofen (ADVIL) 200 MG tablet Take 400 mg by mouth  every 8 (eight) hours as needed (joint pain/headaches.).   Yes [provider]  losartan (COZAAR) 50 MG tablet TAKE 1 TABLET DAILY Patient taking differently: Take 50 mg by mouth every evening. 04/15/23  Yes [place], [name], MD  melatonin 5 MG TABS Take 5 mg by mouth at bedtime as needed (sleep).   Yes [provider]  valACYclovir (VALTREX) 1000 MG tablet Take 2 tablets by mouth as needed upon acute onset and asap repeat dose in 12 hours Patient taking differently: Take 500-1,000 mg by mouth daily as needed (outbreaks). 10/01/18  Yes [place], [name], MD  aspirin EC 81 MG tablet Take 1 tablet (81 mg total) by mouth 2 (two) times daily. To be taken after surgery to prevent blood clots 05/06/23 05/05/24  Cristie Hem, PA-C  docusate sodium (COLACE) 100 MG capsule Take 1 capsule (100 mg total) by mouth daily as  needed. 05/06/23 05/05/24  Cristie Hem, PA-C  ibuprofen (ADVIL) 600 MG tablet Take 1 tablet (600 mg total) by mouth every 8 (eight) hours as needed for up to 21 doses for mild pain or headache. Patient not taking: Reported on 04/23/2023 03/20/23   Terald Sleeper, MD  ondansetron (ZOFRAN) 4 MG tablet Take 1 tablet (4 mg total) by mouth every 8 (eight) hours as needed for nausea or vomiting. 05/06/23   Cristie Hem, PA-C  oxyCODONE-acetaminophen (PERCOCET) 5-325 MG tablet Take 1-2 tablets by mouth every 6 (six) hours as needed. 05/06/23   Cristie Hem, PA-C  tiZANidine (ZANAFLEX) 4 MG tablet Take 1 tablet (4 mg total) by mouth 2 (two) times daily as needed for muscle spasms. 05/06/23 05/05/24  Cristie Hem, PA-C     Positive ROS: All other systems have been reviewed and were otherwise negative with the exception of those mentioned in the HPI and as above.  Physical Exam: General: Alert, no acute distress Cardiovascular: No pedal edema Respiratory: No cyanosis, no use of accessory musculature GI: abdomen soft Skin: No lesions in the area of chief complaint Neurologic: Sensation intact distally Psychiatric: Patient is competent for consent with normal mood and affect Lymphatic: no lymphedema  MUSCULOSKELETAL: exam stable  Assessment: right hip osteoarthritis  Plan: Plan for Procedure(s): RIGHT TOTAL HIP ARTHROPLASTY ANTERIOR APPROACH  The risks benefits and alternatives were discussed with the patient including but not limited to the risks of nonoperative treatment, versus surgical intervention including infection, bleeding, nerve injury,  blood clots, cardiopulmonary complications, morbidity, mortality, among others, and they were willing to proceed.   Glee Arvin, MD 05/13/2023 6:27 AM

## 2023-05-13 NOTE — Transfer of Care (Signed)
Immediate Anesthesia Transfer of Care Note  Patient: Cheryl Barnes  Procedure(s) Performed: RIGHT TOTAL HIP REPLACEMENT (Right: Hip)  Patient Location: PACU  Anesthesia Type:Spinal  Level of Consciousness: awake, oriented, and drowsy  Airway & Oxygen Therapy: Patient Spontanous Breathing and Patient connected to face mask oxygen  Post-op Assessment: Report given to RN, Post -op Vital signs reviewed and stable, and Patient moving all extremities  Post vital signs: Reviewed and stable  Last Vitals:  Vitals Value Taken Time  BP 114/73 05/13/23 1045  Temp    Pulse 59 05/13/23 1051  Resp 15 05/13/23 1051  SpO2 97 % 05/13/23 1051  Vitals shown include unfiled device data.  Last Pain:  Vitals:   05/13/23 0719  TempSrc:   PainSc: 4          Complications: No notable events documented.

## 2023-05-13 NOTE — Evaluation (Signed)
Physical Therapy Evaluation Patient Details Name: Cheryl Barnes MRN: [id number] DOB: October 07, 1950 Today's Date: 05/13/2023  History of Present Illness  72 y.o. female presents to [facility] hospital on 05/13/2023 for elective R THA. PMH includes HTN, diverticulosis, LBBB, HLD.  Clinical Impression  Pt presents to PT s/p R THA mobilizing well. Pt is able to transfer and ambulate without physical assistance. Activity tolerance is limited by nausea and vomiting during session. PT provides education on surgical hip HEP. PT will follow up tomorrow for further functional mobility and stair training.        If plan is discharge home, recommend the following: A little help with bathing/dressing/bathroom;Assistance with cooking/housework;Assist for transportation   Can travel by private vehicle        Equipment Recommendations Rolling walker (2 wheels)  Recommendations for Other Services       Functional Status Assessment Patient has had a recent decline in their functional status and demonstrates the ability to make significant improvements in function in a reasonable and predictable amount of time.     Precautions / Restrictions Precautions Precautions: Fall Precaution Comments: direct anterior THA Restrictions Weight Bearing Restrictions: Yes RLE Weight Bearing: Weight bearing as tolerated      Mobility  Bed Mobility Overal bed mobility: Needs Assistance Bed Mobility: Supine to Sit, Sit to Supine     Supine to sit: Supervision, Used rails Sit to supine: Min assist        Transfers Overall transfer level: Needs assistance Equipment used: Rolling walker (2 wheels) Transfers: Sit to/from Stand Sit to Stand: Contact guard assist                Ambulation/Gait Ambulation/Gait assistance: Contact guard assist Gait Distance (Feet): 80 Feet Assistive device: Rolling walker (2 wheels) Gait Pattern/deviations: Step-through pattern Gait velocity: reduced Gait velocity  interpretation: <1.8 ft/sec, indicate of risk for recurrent falls   General Gait Details: slowed step-through gait, reduced stride length  Stairs            Wheelchair Mobility     Tilt Bed    Modified Rankin (Stroke Patients Only)       Balance Overall balance assessment: Needs assistance Sitting-balance support: No upper extremity supported, Feet supported Sitting balance-Leahy Scale: Good     Standing balance support: Single extremity supported, Reliant on assistive device for balance Standing balance-Leahy Scale: Poor                               Pertinent Vitals/Pain Pain Assessment Pain Assessment: 0-10 Pain Score: 4  Pain Location: R hip Pain Descriptors / Indicators: Sore Pain Intervention(s): Monitored during session    Home Living Family/patient expects to be discharged to:: Private residence Living Arrangements: Alone Available Help at Discharge: Family Type of Home: House Home Access: Stairs to enter Entrance Stairs-Rails: Can reach both Entrance Stairs-Number of Steps: 3   Home Layout: One level Home Equipment: None      Prior Function Prior Level of Function : Independent/Modified Independent;Driving                     Extremity/Trunk Assessment   Upper Extremity Assessment Upper Extremity Assessment: Overall WFL for tasks assessed    Lower Extremity Assessment Lower Extremity Assessment: RLE deficits/detail RLE Deficits / Details: generalized post-op weakness    Cervical / Trunk Assessment Cervical / Trunk Assessment: Normal  Communication   Communication Communication: No apparent difficulties Cueing  Techniques: Verbal cues  Cognition Arousal: Alert Behavior During Therapy: WFL for tasks assessed/performed Overall Cognitive Status: Within Functional Limits for tasks assessed                                          General Comments General comments (skin integrity, edema, etc.): VSS  on RA    Exercises     Assessment/Plan    PT Assessment Patient needs continued PT services  PT Problem List Decreased strength;Decreased activity tolerance;Decreased balance;Decreased mobility;Decreased knowledge of use of DME;Pain       PT Treatment Interventions Gait training;DME instruction;Stair training;Functional mobility training;Therapeutic activities;Balance training;Neuromuscular re-education;Patient/family education    PT Goals (Current goals can be found in the Care Plan section)  Acute Rehab PT Goals Patient Stated Goal: to return to independence PT Goal Formulation: With patient Time For Goal Achievement: 05/17/23 Potential to Achieve Goals: Good    Frequency Min 1X/week     Co-evaluation               AM-PAC PT "6 Clicks" Mobility  Outcome Measure Help needed turning from your back to your side while in a flat bed without using bedrails?: A Little Help needed moving from lying on your back to sitting on the side of a flat bed without using bedrails?: A Little Help needed moving to and from a bed to a chair (including a wheelchair)?: A Little Help needed standing up from a chair using your arms (e.g., wheelchair or bedside chair)?: A Little Help needed to walk in hospital room?: A Little Help needed climbing 3-5 steps with a railing? : Total 6 Click Score: 16    End of Session   Activity Tolerance: Treatment limited secondary to medical complications (Comment) (pt with nausea and vomiting during session) Patient left: in bed;with call bell/phone within reach;with family/visitor present Nurse Communication: Mobility status PT Visit Diagnosis: Other abnormalities of gait and mobility (R26.89);Muscle weakness (generalized) (M62.81);Pain Pain - Right/Left: Right Pain - part of body: Hip    Time: 1510-1550 PT Time Calculation (min) (ACUTE ONLY): 40 min   Charges:   PT Evaluation $PT Eval Low Complexity: 1 Low   PT General Charges $$ ACUTE PT  VISIT: 1 Visit         Arlyss Gandy, PT, DPT Acute Rehabilitation Office [phone]   Arlyss Gandy 05/13/2023, 4:01 PM

## 2023-05-13 NOTE — Anesthesia Procedure Notes (Signed)
Spinal  Patient location during procedure: OR Start time: 05/13/2023 8:38 AM End time: 05/13/2023 8:43 AM Reason for block: surgical anesthesia Staffing Performed: anesthesiologist  Anesthesiologist: Gaynelle Adu, MD Performed by: Gaynelle Adu, MD Authorized by: Gaynelle Adu, MD   Preanesthetic Checklist Completed: patient identified, IV checked, risks and benefits discussed, surgical consent, monitors and equipment checked, pre-op evaluation and timeout performed Spinal Block Patient position: sitting Prep: DuraPrep Patient monitoring: cardiac monitor, continuous pulse ox and blood pressure Approach: right paramedian (Attempted midline. Unable to locate space.) Location: L3-4 Injection technique: single-shot Needle Needle type: Quincke  Needle gauge: 22 G Needle length: 9 cm Assessment Sensory level: T8 Events: CSF return Additional Notes Functioning IV was confirmed and monitors were applied. Sterile prep and drape, including hand hygiene and sterile gloves were used. The patient was positioned and the spine was prepped. The skin was anesthetized with lidocaine.  Free flow of clear CSF was obtained prior to injecting local anesthetic into the CSF.  The spinal needle aspirated freely following injection.  The needle was carefully withdrawn.  The patient tolerated the procedure well.

## 2023-05-13 NOTE — Discharge Instructions (Signed)

## 2023-05-13 NOTE — Op Note (Signed)
RIGHT TOTAL HIP REPLACEMENT  Procedure Note Cheryl Barnes   [id number]  Pre-op Diagnosis: right hip osteoarthritis     Post-op Diagnosis: same  Operative Findings Advanced chondromalacia of the superior femoral head and zone 1 of acetabulum   Operative Procedures  1. Total hip replacement; Right hip; uncemented [id number]   Surgeon: Gershon Mussel, M.D.  Assist: Oneal Grout, PA-C   Anesthesia: spinal  Prosthesis: Depuy Acetabulum: Pinnacle 54 mm Femur: Actis 2 STD Head: 36 mm size: +1.5 Liner: +4 Bearing Type: ceramic/poly  Total Hip Arthroplasty (Anterior Approach) Op Note:  After informed consent was obtained and the operative extremity marked in the holding area, the patient was brought back to the operating room and placed supine on the HANA table. Next, the operative extremity was prepped and draped in normal sterile fashion. Surgical timeout occurred verifying patient identification, surgical site, surgical procedure and administration of antibiotics.  A 10 cm longitudinal incision was made starting from 2 fingerbreadths lateral and inferior to the ASIS towards the lateral aspect of the patella.  A Hueter approach to the hip was performed, using the interval between tensor fascia lata and sartorius.  Dissection was carried bluntly down onto the anterior hip capsule. The lateral femoral circumflex vessels were identified and coagulated. A capsulotomy was performed and the capsular flaps tagged for later repair.  The neck osteotomy was performed. The femoral head was removed which showed advanced chondromalacia of the weight bearing surfaces, the acetabular rim was cleared of soft tissue and osteophytes and attention was turned to reaming the acetabulum.  Sequential reaming was performed under fluoroscopic guidance down to the floor of the cotyloid fossa. We reamed to a size 54 mm, and then impacted the acetabular shell. A 25 mm cancellous screw was placed to secure the  shell.  The liner was then placed after irrigation and attention turned to the femur.  After placing the femoral hook, the leg was taken to externally rotated, extended and adducted position taking care to perform soft tissue releases to allow for adequate mobilization of the femur. Soft tissue was cleared from the shoulder of the greater trochanter and the hook elevator used to improve exposure of the proximal femur. Sequential broaching performed up to a size 2. Trial neck and head were placed. The leg was brought back up to neutral and the construct reduced.  Antibiotic irrigation was placed in the surgical wound.  The position and sizing of components, offset and leg lengths were checked using fluoroscopy. Stability of the construct was checked in extension and external rotation without any subluxation, shuck or impingement of prosthesis. We dislocated the prosthesis, dropped the leg back into position, removed trial components, and irrigated copiously. The final stem and head was then placed, the leg brought back up, the system reduced and fluoroscopy used to verify positioning.  We irrigated, obtained hemostasis and closed the capsule using #2 ethibond suture.  One gram of vancomycin powder was placed in the surgical bed.   One gram of topical tranexamic acid was injected into the joint.  The fascia was closed with #1 vicryl plus, the deep fat layer was closed with 0 vicryl, the subcutaneous layers closed with 2.0 Vicryl Plus and the skin closed with 2.0 nylon and dermabond. A sterile dressing was applied. The patient was awakened in the operating room and taken to recovery in stable condition.  All sponge, needle, and instrument counts were correct at the end of the case.   Cheryl Barnes, my  PA, was a medical necessity for opening, closing, limb positioning, retracting, exposing, and overall facilitation and timely completion of the surgery.  Position: supine  Complications: see description of  procedure.  Time Out: performed   Drains/Packing: none  Estimated blood loss: see anesthesia record  Returned to Recovery Room: in good condition.   Antibiotics: yes   Mechanical VTE (DVT) Prophylaxis: sequential compression devices, TED thigh-high  Chemical VTE (DVT) Prophylaxis: aspirin   Fluid Replacement: see anesthesia record  Specimens Removed: 1 to pathology   Sponge and Instrument Count Correct? yes   PACU: portable radiograph - low AP   Plan/RTC: Return in 2 weeks for staple removal. Weight Bearing/Load Lower Extremity: full  Hip precautions: none Suture Removal: 2 weeks   N. Glee Arvin, MD [facility] 10:12 AM   Implant Name Type Inv. Item Serial No. Manufacturer Lot No. LRB No. Used Action  ACETAB CUP W/GRIPTION 54 - [id number] Plate ACETAB CUP W/GRIPTION 54  [employer] [id number] Right 1 Implanted  SCREW 6.5MMX25MM - [id number] Screw SCREW 6.5MMX25MM  [employer] [id number] Right 1 Implanted  LINER NEUTRAL 54X36MM PLUS 4 - [id number] Hips LINER NEUTRAL 54X36MM PLUS 4  [employer] [id number] Right 1 Implanted  HEAD CERAMIC DELTA 36 PLUS 1.5 - [id number] Hips HEAD CERAMIC DELTA 36 PLUS 1.5  [employer] [id number] Right 1 Implanted  STEM FEM ACTIS STD SZ2 - [id number] Stem STEM FEM ACTIS STD SZ2  [employer] [id number] Right 1 Implanted

## 2023-05-13 NOTE — Plan of Care (Signed)
Problem: Education: Goal: Knowledge of the prescribed therapeutic regimen will improve Outcome: Completed/Met Goal: Understanding of discharge needs will improve Outcome: Completed/Met Goal: Individualized Educational Video(s) Outcome: Completed/Met   Problem: Activity: Goal: Ability to avoid complications of mobility impairment will improve Outcome: Completed/Met Goal: Ability to tolerate increased activity will improve Outcome: Completed/Met   Problem: Clinical Measurements: Goal: Postoperative complications will be avoided or minimized Outcome: Completed/Met   Problem: Pain Management: Goal: Pain level will decrease with appropriate interventions Outcome: Completed/Met   Problem: Skin Integrity: Goal: Will show signs of wound healing Outcome: Completed/Met

## 2023-05-13 NOTE — Anesthesia Postprocedure Evaluation (Signed)
Anesthesia Post Note  Patient: Cheryl Barnes  Procedure(s) Performed: RIGHT TOTAL HIP REPLACEMENT (Right: Hip)     Patient location during evaluation: PACU Anesthesia Type: Spinal Level of consciousness: oriented and awake and alert Pain management: pain level controlled Vital Signs Assessment: post-procedure vital signs reviewed and stable Respiratory status: spontaneous breathing and respiratory function stable Cardiovascular status: blood pressure returned to baseline and stable Postop Assessment: no headache, no backache, no apparent nausea or vomiting, spinal receding and patient able to bend at knees Anesthetic complications: no  No notable events documented.  Last Vitals:  Vitals:   05/13/23 1130 05/13/23 1150  BP: 139/76 (!) 132/91  Pulse: 66 72  Resp: 18 20  Temp: 36.6 C 36.5 C  SpO2: 92% 93%    Last Pain:  Vitals:   05/13/23 1150  TempSrc: Oral  PainSc:                  Swanson Farnell,W. EDMOND

## 2023-05-14 DIAGNOSIS — M1611 Unilateral primary osteoarthritis, right hip: Secondary | ICD-10-CM | POA: Diagnosis not present

## 2023-05-14 NOTE — Progress Notes (Addendum)
Subjective: 1 Day Post-Op Procedure(s) (LRB): RIGHT TOTAL HIP REPLACEMENT (Right) Patient reports pain as mild.    Objective: Vital signs in last 24 hours: Temp:  [97.6 F (36.4 C)-98.4 F (36.9 C)] 98.4 F (36.9 C) (09/17 0500) Pulse Rate:  [65-77] 67 (09/17 0500) Resp:  [12-20] 20 (09/17 0500) BP: (103-139)/(64-97) 103/64 (09/17 0500) SpO2:  [90 %-100 %] 95 % (09/17 0500)  Intake/Output from previous day: 09/16 0701 - 09/17 0700 In: 1160 [P.O.:960; IV Piggyback:200] Out: 2550 [Urine:2450; Blood:100] Intake/Output this shift: Total I/O In: -  Out: 150 [Urine:150]  No results for input(s): "HGB" in the last 72 hours. No results for input(s): "WBC", "RBC", "HCT", "PLT" in the last 72 hours. No results for input(s): "NA", "K", "CL", "CO2", "BUN", "CREATININE", "GLUCOSE", "CALCIUM" in the last 72 hours. No results for input(s): "LABPT", "INR" in the last 72 hours.  Neurologically intact Neurovascular intact Sensation intact distally Intact pulses distally Dorsiflexion/Plantar flexion intact Incision: scant drainage No cellulitis present Compartment soft   Assessment/Plan: 1 Day Post-Op Procedure(s) (LRB): RIGHT TOTAL HIP REPLACEMENT (Right) Advance diet Up with therapy D/C IV fluids Discharge home with home health once cleared by PT WBAT RLE       Cristie Hem 05/14/2023, 7:59 AM

## 2023-05-14 NOTE — Progress Notes (Signed)
Patient awaiting transport via wheelchair by volunteer for discharge home; in no acute distress nor complaints of pain nor discomfort; incision on her right hip with hydrocolloid dressing and is clean, dry and intact; room was checked and accounted for all her belongings; discharge instructions concerning her medications, incision care, follow up appointment and when to call the doctor as needed were all discussed with patient by RN and she expressed understanding on the instructions given

## 2023-05-14 NOTE — Progress Notes (Signed)
Physical Therapy Treatment Patient Details Name: Cheryl Barnes MRN: [id number] DOB: [birth year] Today's Date: 05/14/2023   History of Present Illness 72 y.o. female presents to [facility] hospital on 05/13/2023 for elective R THA. PMH includes HTN, diverticulosis, LBBB, HLD.    PT Comments  Pt received in supine, agreeable to therapy session and with good participation and tolerance for transfer, gait and stair training. Pt daughter present and receptive to instruction on guarding with gait belt (pt given gait belt for home for safety with standing tasks/stairs) and pt able to demo back all activities as detailed below with up to minA ascending/descending stairs with safety cues. Reviewed supine/seated/standing HEP with pt and pt able to demo back. Reviewed use of iceman PRN for pain/swelling mgmt, RN gave pain meds prior to OOB mobility at beginning of session. No acute s/sx distress or nausea reported during functional tasks. Pt continues to benefit from PT services to progress toward functional mobility goals, anticipate pt safe to DC home from a functional mobility perspective once medically cleared, do not anticipate additional PT session needed this date acutely unless pt requests one, pt/daughter agreeable she is ready for home.     If plan is discharge home, recommend the following: A little help with bathing/dressing/bathroom;Assistance with cooking/housework;Assist for transportation   Can travel by private vehicle        Equipment Recommendations  Rolling walker (2 wheels)    Recommendations for Other Services       Precautions / Restrictions Precautions Precautions: Fall Precaution Comments: direct anterior THA Restrictions Weight Bearing Restrictions: Yes RLE Weight Bearing: Weight bearing as tolerated     Mobility  Bed Mobility Overal bed mobility: Needs Assistance Bed Mobility: Supine to Sit     Supine to sit: Supervision     General bed mobility comments: from flat  bed/no rails, increased time/effort to perform and verbal/visual cues for improved technique, no physical assist.    Transfers Overall transfer level: Needs assistance Equipment used: Rolling walker (2 wheels) Transfers: Sit to/from Stand Sit to Stand: Supervision           General transfer comment: Cues for BLE placement for reduced pain and safer BUE placement; pt ignoring cues initially and pulling up on RW handles but follows cues for stand>sit.    Ambulation/Gait Ambulation/Gait assistance: Supervision Gait Distance (Feet): 125 Feet Assistive device: Rolling walker (2 wheels) Gait Pattern/deviations: Step-through pattern Gait velocity: reduced Gait velocity interpretation: <1.8 ft/sec, indicate of risk for recurrent falls   General Gait Details: slowed step-through gait, reduced stride length. min cues for improved RW proximity initially with good carryover.   Stairs Stairs: Yes Stairs assistance: Min assist Stair Management: One rail Left, Step to pattern, Forwards Number of Stairs: 4 General stair comments: pt daughter instructed on guarding position for stairs with HHA /forearm assist and use of gait belt for safety, able to demo back. Pt benefits from consistent cues for step sequencing ascending/descending due to decreased carryover when descending. Daughter able to remind her with "good leg goes up and sore/bad leg goes down"   Wheelchair Mobility     Tilt Bed    Modified Rankin (Stroke Patients Only)       Balance Overall balance assessment: Needs assistance Sitting-balance support: No upper extremity supported, Feet supported Sitting balance-Leahy Scale: Good     Standing balance support: Single extremity supported, Reliant on assistive device for balance, Bilateral upper extremity supported, During functional activity Standing balance-Leahy Scale: Fair Standing balance comment: static standing  fair to wash hands and dynamic standing fair with RW; pt  with good safety awareness standing and keeps AD with her while standing/transferring.                            Cognition Arousal: Alert Behavior During Therapy: WFL for tasks assessed/performed Overall Cognitive Status: Within Functional Limits for tasks assessed                                 General Comments: Needs some reminders for step sequencing with stairs, daughter present and able to reinforce this with her as well.        Exercises Other Exercises Other Exercises: supine BLE AROM: ankle pumps, heel slides x3-5 reps ea Other Exercises: seated LAQ pt given visual/verbal demo Other Exercises: standing RLE AROM: hip flexion, hip abduction, hip ext x3-5 reps ea cues for technique to demo back    General Comments General comments (skin integrity, edema, etc.): VSS on RA per chart review, no acute s/sx distress throughout      Pertinent Vitals/Pain Pain Assessment Pain Assessment: 0-10 Pain Score: 4  Pain Location: R hip Pain Descriptors / Indicators: Sore Pain Intervention(s): Monitored during session, Repositioned, RN gave pain meds during session, Ice applied     PT Goals (current goals can now be found in the care plan section) Acute Rehab PT Goals Patient Stated Goal: to return to independence PT Goal Formulation: With patient Time For Goal Achievement: 05/17/23 Progress towards PT goals: Progressing toward goals    Frequency    Min 1X/week      PT Plan         AM-PAC PT "6 Clicks" Mobility   Outcome Measure  Help needed turning from your back to your side while in a flat bed without using bedrails?: A Little Help needed moving from lying on your back to sitting on the side of a flat bed without using bedrails?: A Little Help needed moving to and from a bed to a chair (including a wheelchair)?: A Little Help needed standing up from a chair using your arms (e.g., wheelchair or bedside chair)?: A Little Help needed to walk  in hospital room?: A Little Help needed climbing 3-5 steps with a railing? : A Little 6 Click Score: 18    End of Session Equipment Utilized During Treatment: Gait belt Activity Tolerance: Patient tolerated treatment well Patient left: with call bell/phone within reach;with family/visitor present;in chair (daughter in room) Nurse Communication: Mobility status PT Visit Diagnosis: Other abnormalities of gait and mobility (R26.89);Muscle weakness (generalized) (M62.81);Pain Pain - Right/Left: Right Pain - part of body: Hip     Time: 6644-0347 PT Time Calculation (min) (ACUTE ONLY): 32 min  Charges:    $Gait Training: 8-22 mins $Therapeutic Activity: 8-22 mins PT General Charges $$ ACUTE PT VISIT: 1 Visit                     Carlynn Leduc P., PTA Acute Rehabilitation Services Secure Chat Preferred 9a-5:30pm Office: [phone]    Dorathy Kinsman [facility] 05/14/2023, 10:31 AM

## 2023-05-14 NOTE — Discharge Summary (Signed)
Patient ID: Cheryl Barnes MRN: [id number] DOB/AGE: 03/05/1951 72 y.o.  Admit date: 05/13/2023 Discharge date: 05/14/2023  Admission Diagnoses:  Principal Problem:   Primary osteoarthritis of right hip Active Problems:   Status post total replacement of right hip   Discharge Diagnoses:  Same  Past Medical History:  Diagnosis Date   Allergy    Diverticulitis    History of hiatal hernia    Hypertension    LBBB (left bundle branch block)    Migraine    history   Nonobstructive atherosclerosis of coronary artery    Osteoarthritis, hip, bilateral     Surgeries: Procedure(s): RIGHT TOTAL HIP REPLACEMENT on 05/13/2023   Consultants:   Discharged Condition: Improved  Hospital Course: Cheryl Barnes is an 72 y.o. female who was admitted 05/13/2023 for operative treatment ofPrimary osteoarthritis of right hip. Patient has severe unremitting pain that affects sleep, daily activities, and work/hobbies. After pre-op clearance the patient was taken to the operating room on 05/13/2023 and underwent  Procedure(s): RIGHT TOTAL HIP REPLACEMENT.    Patient was given perioperative antibiotics:  Anti-infectives (From admission, onward)    Start     Dose/Rate Route Frequency Ordered Stop   05/13/23 1145  ceFAZolin (ANCEF) IVPB 2g/100 mL premix        2 g 200 mL/hr over 30 Minutes Intravenous Every 6 hours 05/13/23 1130 05/13/23 1901   05/13/23 0950  vancomycin (VANCOCIN) powder  Status:  Discontinued          As needed 05/13/23 0950 05/13/23 1041   05/13/23 0700  ceFAZolin (ANCEF) IVPB 2g/100 mL premix        2 g 200 mL/hr over 30 Minutes Intravenous On call to O.R. 05/13/23 9562 05/13/23 0931        Patient was given sequential compression devices, early ambulation, and chemoprophylaxis to prevent DVT.  Patient benefited maximally from hospital stay and there were no complications.    Recent vital signs: Patient Vitals for the past 24 hrs:  BP Temp Temp src Pulse Resp SpO2   05/14/23 0500 103/64 98.4 F (36.9 C) Oral 67 20 95 %  05/13/23 2348 124/67 97.6 F (36.4 C) Oral 65 18 100 %  05/13/23 2017 131/69 97.7 F (36.5 C) Oral 77 -- 100 %  05/13/23 1657 118/67 98.2 F (36.8 C) Oral 76 20 90 %  05/13/23 1150 (!) 132/91 97.7 F (36.5 C) Oral 72 20 93 %  05/13/23 1130 139/76 97.8 F (36.6 C) -- 66 18 92 %  05/13/23 1115 121/68 -- -- 68 16 91 %  05/13/23 1100 (!) 117/97 -- -- 66 12 94 %  05/13/23 1045 114/73 97.6 F (36.4 C) -- 66 17 97 %     Recent laboratory studies: No results for input(s): "WBC", "HGB", "HCT", "PLT", "NA", "K", "CL", "CO2", "BUN", "CREATININE", "GLUCOSE", "INR", "CALCIUM" in the last 72 hours.  Invalid input(s): "PT", "2"   Discharge Medications:   Allergies as of 05/14/2023       Reactions   Penicillins Itching, Rash   Has patient had a PCN reaction causing immediate rash, facial/tongue/throat swelling, SOB or lightheadedness with hypotension: Yes Has patient had a PCN reaction causing severe rash involving mucus membranes or skin necrosis: No Has patient had a PCN reaction that required hospitalization No Has patient had a PCN reaction occurring within the last 10 years: No If all of the above answers are "NO", then may proceed with Cephalosporin use.   Seasonal Ic [cholestatin] Other (See  Comments)   Upper resp, sore throat, sneezing, etc.   Tape Other (See Comments)   Tape causes some redness & causes BLISTERS if left on for too long   Latex Itching, Rash        Medication List     STOP taking these medications    acetaminophen 500 MG tablet Commonly known as: TYLENOL   ibuprofen 200 MG tablet Commonly known as: ADVIL   ibuprofen 600 MG tablet Commonly known as: ADVIL       TAKE these medications    amLODipine 5 MG tablet Commonly known as: NORVASC TAKE 1 TABLET DAILY   aspirin EC 81 MG tablet Take 1 tablet (81 mg total) by mouth 2 (two) times daily. To be taken after surgery to prevent blood clots    cetirizine 10 MG tablet Commonly known as: ZYRTEC Take 10 mg by mouth every evening.   Clear Tinnitus Caps Take 1 capsule by mouth daily after supper. Silencil Max for Tinnitus   docusate sodium 100 MG capsule Commonly known as: Colace Take 1 capsule (100 mg total) by mouth daily as needed.   losartan 50 MG tablet Commonly known as: COZAAR TAKE 1 TABLET DAILY What changed: when to take this   melatonin 5 MG Tabs Take 5 mg by mouth at bedtime as needed (sleep).   ondansetron 4 MG tablet Commonly known as: Zofran Take 1 tablet (4 mg total) by mouth every 8 (eight) hours as needed for nausea or vomiting.   oxyCODONE-acetaminophen 5-325 MG tablet Commonly known as: Percocet Take 1-2 tablets by mouth every 6 (six) hours as needed.   tiZANidine 4 MG tablet Commonly known as: Zanaflex Take 1 tablet (4 mg total) by mouth 2 (two) times daily as needed for muscle spasms.   valACYclovir 1000 MG tablet Commonly known as: VALTREX Take 2 tablets by mouth as needed upon acute onset and asap repeat dose in 12 hours What changed:  how much to take how to take this when to take this reasons to take this additional instructions               Durable Medical Equipment  (From admission, onward)           Start     Ordered   05/13/23 1131  DME Walker rolling  Once       Question:  Patient needs a walker to treat with the following condition  Answer:  History of hip replacement   05/13/23 1130   05/13/23 1131  DME 3 n 1  Once        05/13/23 1130   05/13/23 1131  DME Bedside commode  Once       Question:  Patient needs a bedside commode to treat with the following condition  Answer:  History of hip replacement   05/13/23 1130            Diagnostic Studies: DG Pelvis Portable  Result Date: 05/13/2023 CLINICAL DATA:  Status post total right hip arthroplasty. EXAM: PORTABLE PELVIS 1-2 VIEWS COMPARISON:  Bilateral hip radiographs 02/06/2023 FINDINGS: There is diffuse  decreased bone mineralization. Interval total right hip arthroplasty. No perihardware lucency is seen to indicate hardware failure or loosening. Expected postoperative subcutaneous air about the right hip. Mild superomedial left femoroacetabular joint space narrowing. Moderate superolateral left acetabular degenerative osteophytes. Mild bilateral inferior sacroiliac subchondral sclerosis. Minimal pubic symphysis degenerative articular surface irregularity. No acute fracture or dislocation. IMPRESSION: Interval total right hip arthroplasty without evidence of  hardware failure or loosening. Electronically Signed   By: Neita Garnet M.D.   On: 05/13/2023 12:28   DG HIP UNILAT WITH PELVIS 1V RIGHT  Result Date: 05/13/2023 CLINICAL DATA:  Total right hip arthroplasty. Intraoperative fluoroscopy. EXAM: DG HIP (WITH OR WITHOUT PELVIS) 1V RIGHT COMPARISON:  Bilateral hip radiographs 01/30/2023 FINDINGS: Images were performed intraoperatively without the presence of a radiologist. Interval total right hip arthroplasty. No hardware complication is seen. Total fluoroscopy images: 4 Total fluoroscopy time: 39 seconds Total dose: Radiation Exposure Index (as provided by the fluoroscopic device): 4.61 mGy air Kerma Please see intraoperative findings for further detail. IMPRESSION: Intraoperative fluoroscopy for total right hip arthroplasty. Electronically Signed   By: Neita Garnet M.D.   On: 05/13/2023 12:25   DG C-Arm 1-60 Min-No Report  Result Date: 05/13/2023 Fluoroscopy was utilized by the requesting physician.  No radiographic interpretation.   DG C-Arm 1-60 Min-No Report  Result Date: 05/13/2023 Fluoroscopy was utilized by the requesting physician.  No radiographic interpretation.    Disposition: Discharge disposition: 01-Home or Self Care          Follow-up Information     Cristie Hem, PA-C. Schedule an appointment as soon as possible for a visit in 2 week(s).   Specialty: Orthopedic  Surgery Contact information: [address] [place] [address] [phone]         [facility] Follow up.   Why: [facility]:: The home health agency will contact you for the first home visit Contact information: [address] [place] [address] [phone]                  Signed: Cristie Hem 05/14/2023, 8:00 AM

## 2023-05-15 ENCOUNTER — Telehealth: Payer: Self-pay | Admitting: *Deleted

## 2023-05-15 ENCOUNTER — Telehealth: Payer: Self-pay | Admitting: Orthopaedic Surgery

## 2023-05-15 NOTE — Telephone Encounter (Signed)
Therapist with [facility] called stating she evaluated patient today and is doing well. Sleepy with pain medication and recommendation taking separately from muscle relaxer. Verbal orders given for 2wk1, 3wk1.

## 2023-05-15 NOTE — Telephone Encounter (Signed)
Pt called due to questions of her medication. Pt advised to give her a call back please. Aletha Edmonson [phone]

## 2023-05-15 NOTE — Telephone Encounter (Signed)
Patient wants to know if she can use mineral oil as a stool softener? She states that she spoke with Mardella Layman about this before discharge but never got an answer. Please advise.

## 2023-05-16 NOTE — Telephone Encounter (Signed)
I did indeed speak with her as well as her daughter about this and told her to ask the pharmacist

## 2023-05-20 ENCOUNTER — Telehealth: Payer: Self-pay | Admitting: Family Medicine

## 2023-05-20 ENCOUNTER — Telehealth: Payer: Self-pay

## 2023-05-20 ENCOUNTER — Telehealth: Payer: Self-pay | Admitting: Orthopaedic Surgery

## 2023-05-20 ENCOUNTER — Ambulatory Visit: Payer: Medicare Other

## 2023-05-20 MED ORDER — VALACYCLOVIR HCL 1 G PO TABS: ORAL_TABLET | ORAL | 2 refills | Status: AC

## 2023-05-20 MED ORDER — TIZANIDINE HCL 4 MG PO TABS: 4.0000 mg | ORAL_TABLET | Freq: Two times a day (BID) | ORAL | 2 refills | Status: DC | PRN

## 2023-05-20 MED ORDER — OXYCODONE-ACETAMINOPHEN 5-325 MG PO TABS: 1.0000 | ORAL_TABLET | Freq: Every day | ORAL | 0 refills | Status: DC | PRN

## 2023-05-20 NOTE — Telephone Encounter (Signed)
Can you please send in for me?!

## 2023-05-20 NOTE — Telephone Encounter (Signed)
Mark with [facility] would like to know if a Rx for Lasix can be sent to [facility] for patient.  Stated that patient's pain is better, but just has a lot of swelling in the right hip.  Patient had right hip surgery on 05/13/2023.  Cb# for Loraine Leriche will be [phone]. Office # [phone]. CB# for patient [phone].  Please advise.  Thank you.

## 2023-05-20 NOTE — Telephone Encounter (Signed)
Patient called needing Rx refilled Oxycodone Acetaminophen and Tizanidine. Patient said she is out of her medication. Patient said she called and spoke with the on call provider also concerning her medication.  The number to contact patient is [phone]

## 2023-05-20 NOTE — Telephone Encounter (Signed)
Prescription Request  05/20/2023  LOV: 10/24/2022  What is the name of the medication or equipment?  valACYclovir (VALTREX) 1000 MG tablet   Have you contacted your pharmacy to request a refill? No   Which pharmacy would you like this sent to?   [id number] [place], [place] - [address] AT [facility] OF [address] & [facility] [address] [place] [address] Phone: [phone] Fax: [phone]    Patient notified that their request is being sent to the clinical staff for review and that they should receive a response within 2 business days.   Please advise at Mobile [phone] (mobile)

## 2023-05-20 NOTE — Telephone Encounter (Signed)
Rx sent 

## 2023-05-21 ENCOUNTER — Ambulatory Visit: Payer: Medicare Other

## 2023-05-21 ENCOUNTER — Other Ambulatory Visit: Payer: Medicare Other

## 2023-05-21 NOTE — Telephone Encounter (Signed)
We do not send in this type of medicine.  Will need to contact pcp

## 2023-05-21 NOTE — Telephone Encounter (Signed)
Called and notified Loraine Leriche with [employer].

## 2023-05-22 ENCOUNTER — Encounter (HOSPITAL_COMMUNITY): Payer: Self-pay

## 2023-05-22 ENCOUNTER — Emergency Department (HOSPITAL_BASED_OUTPATIENT_CLINIC_OR_DEPARTMENT_OTHER): Payer: Medicare Other

## 2023-05-22 ENCOUNTER — Emergency Department (HOSPITAL_COMMUNITY)
Admission: EM | Admit: 2023-05-22 | Discharge: 2023-05-23 | Disposition: A | Discharge status: Home / Self Care | Payer: Medicare Other | Attending: Emergency Medicine | Admitting: Emergency Medicine

## 2023-05-22 ENCOUNTER — Emergency Department (HOSPITAL_COMMUNITY): Payer: Medicare Other

## 2023-05-22 ENCOUNTER — Telehealth: Payer: Self-pay | Admitting: *Deleted

## 2023-05-22 ENCOUNTER — Other Ambulatory Visit: Payer: Self-pay

## 2023-05-22 DIAGNOSIS — G8918 Other acute postprocedural pain: Secondary | ICD-10-CM | POA: Diagnosis not present

## 2023-05-22 DIAGNOSIS — I10 Essential (primary) hypertension: Secondary | ICD-10-CM | POA: Insufficient documentation

## 2023-05-22 DIAGNOSIS — R2241 Localized swelling, mass and lump, right lower limb: Secondary | ICD-10-CM | POA: Diagnosis not present

## 2023-05-22 DIAGNOSIS — Z96641 Presence of right artificial hip joint: Secondary | ICD-10-CM | POA: Insufficient documentation

## 2023-05-22 DIAGNOSIS — Z79899 Other long term (current) drug therapy: Secondary | ICD-10-CM | POA: Diagnosis not present

## 2023-05-22 DIAGNOSIS — Z1152 Encounter for screening for COVID-19: Secondary | ICD-10-CM | POA: Diagnosis not present

## 2023-05-22 DIAGNOSIS — M7989 Other specified soft tissue disorders: Secondary | ICD-10-CM

## 2023-05-22 DIAGNOSIS — Z91040 Latex allergy status: Secondary | ICD-10-CM | POA: Diagnosis not present

## 2023-05-22 DIAGNOSIS — Z7982 Long term (current) use of aspirin: Secondary | ICD-10-CM | POA: Diagnosis not present

## 2023-05-22 DIAGNOSIS — R509 Fever, unspecified: Secondary | ICD-10-CM | POA: Insufficient documentation

## 2023-05-22 LAB — I-STAT CHEM 8, ED
BUN: 22 mg/dL (ref 8–23)
Calcium, Ion: 1.05 mmol/L — ABNORMAL LOW (ref 1.15–1.40)
Chloride: 107 mmol/L (ref 98–111)
Creatinine, Ser: 0.8 mg/dL (ref 0.44–1.00)
Glucose, Bld: 142 mg/dL — ABNORMAL HIGH (ref 70–99)
HCT: 29 % — ABNORMAL LOW (ref 36.0–46.0)
Hemoglobin: 9.9 g/dL — ABNORMAL LOW (ref 12.0–15.0)
Potassium: 3.7 mmol/L (ref 3.5–5.1)
Sodium: 140 mmol/L (ref 135–145)
TCO2: 21 mmol/L — ABNORMAL LOW (ref 22–32)

## 2023-05-22 LAB — URINALYSIS, W/ REFLEX TO CULTURE (INFECTION SUSPECTED)
Bacteria, UA: NONE SEEN
Bilirubin Urine: NEGATIVE
Glucose, UA: NEGATIVE mg/dL
Hgb urine dipstick: NEGATIVE
Ketones, ur: NEGATIVE mg/dL
Nitrite: NEGATIVE
Protein, ur: NEGATIVE mg/dL
Specific Gravity, Urine: 1.029 (ref 1.005–1.030)
pH: 5 (ref 5.0–8.0)

## 2023-05-22 LAB — CBC
HCT: 29.5 % — ABNORMAL LOW (ref 36.0–46.0)
Hemoglobin: 9.7 g/dL — ABNORMAL LOW (ref 12.0–15.0)
MCH: 31 pg (ref 26.0–34.0)
MCHC: 32.9 g/dL (ref 30.0–36.0)
MCV: 94.2 fL (ref 80.0–100.0)
Platelets: 213 10*3/uL (ref 150–400)
RBC: 3.13 MIL/uL — ABNORMAL LOW (ref 3.87–5.11)
RDW: 13.7 % (ref 11.5–15.5)
WBC: 5.1 10*3/uL (ref 4.0–10.5)
nRBC: 0 % (ref 0.0–0.2)

## 2023-05-22 LAB — RESP PANEL BY RT-PCR (RSV, FLU A&B, COVID)  RVPGX2
Influenza A by PCR: NEGATIVE
Influenza B by PCR: NEGATIVE
Resp Syncytial Virus by PCR: NEGATIVE
SARS Coronavirus 2 by RT PCR: NEGATIVE

## 2023-05-22 LAB — I-STAT CG4 LACTIC ACID, ED: Lactic Acid, Venous: 1.5 mmol/L (ref 0.5–1.9)

## 2023-05-22 LAB — BASIC METABOLIC PANEL
Anion gap: 9 (ref 5–15)
BUN: 21 mg/dL (ref 8–23)
CO2: 22 mmol/L (ref 22–32)
Calcium: 8.7 mg/dL — ABNORMAL LOW (ref 8.9–10.3)
Chloride: 106 mmol/L (ref 98–111)
Creatinine, Ser: 0.92 mg/dL (ref 0.44–1.00)
GFR, Estimated: >60 mL/min (ref >60)
Glucose, Bld: 145 mg/dL — ABNORMAL HIGH (ref 70–99)
Potassium: 3.6 mmol/L (ref 3.5–5.1)
Sodium: 137 mmol/L (ref 135–145)

## 2023-05-22 LAB — POC OCCULT BLOOD, ED: Fecal Occult Bld: NEGATIVE

## 2023-05-22 LAB — SEDIMENTATION RATE: Sed Rate: 39 mm/hr — ABNORMAL HIGH (ref 0–22)

## 2023-05-22 LAB — C-REACTIVE PROTEIN: CRP: 2.2 mg/dL — ABNORMAL HIGH (ref <1.0)

## 2023-05-22 MED ORDER — ONDANSETRON HCL 4 MG PO TABS
4.0000 mg | ORAL_TABLET | Freq: Three times a day (TID) | ORAL | Status: DC | PRN
  Administered 2023-05-23: 4 mg via ORAL
  Filled 2023-05-22: qty 1

## 2023-05-22 MED ORDER — ASPIRIN 81 MG PO TBEC
81.0000 mg | DELAYED_RELEASE_TABLET | Freq: Two times a day (BID) | ORAL | Status: DC
  Administered 2023-05-22 – 2023-05-23 (×2): 81 mg via ORAL
  Filled 2023-05-22 (×2): qty 1

## 2023-05-22 MED ORDER — ACETAMINOPHEN 325 MG PO TABS
650.0000 mg | ORAL_TABLET | Freq: Four times a day (QID) | ORAL | Status: DC | PRN
  Administered 2023-05-22 – 2023-05-23 (×3): 650 mg via ORAL
  Filled 2023-05-22 (×3): qty 2

## 2023-05-22 MED ORDER — LIDOCAINE 5 % EX PTCH
1.0000 | MEDICATED_PATCH | CUTANEOUS | Status: DC
  Administered 2023-05-22 – 2023-05-23 (×2): 1 via TRANSDERMAL
  Filled 2023-05-22 (×2): qty 1

## 2023-05-22 MED ORDER — TIZANIDINE HCL 4 MG PO TABS
4.0000 mg | ORAL_TABLET | Freq: Three times a day (TID) | ORAL | Status: DC | PRN
  Administered 2023-05-22 – 2023-05-23 (×2): 4 mg via ORAL
  Filled 2023-05-22 (×2): qty 1

## 2023-05-22 MED ORDER — OXYCODONE HCL 5 MG PO TABS
5.0000 mg | ORAL_TABLET | Freq: Four times a day (QID) | ORAL | Status: DC | PRN
  Administered 2023-05-22 – 2023-05-23 (×4): 5 mg via ORAL
  Filled 2023-05-22 (×4): qty 1

## 2023-05-22 MED ORDER — AMLODIPINE BESYLATE 5 MG PO TABS
5.0000 mg | ORAL_TABLET | Freq: Every day | ORAL | Status: DC
  Administered 2023-05-22 – 2023-05-23 (×2): 5 mg via ORAL
  Filled 2023-05-22 (×2): qty 1

## 2023-05-22 MED ORDER — HYDROMORPHONE HCL 1 MG/ML IJ SOLN
0.5000 mg | Freq: Once | INTRAMUSCULAR | Status: AC
  Administered 2023-05-22: 0.5 mg via INTRAVENOUS
  Filled 2023-05-22: qty 1

## 2023-05-22 MED ORDER — LOSARTAN POTASSIUM 50 MG PO TABS
50.0000 mg | ORAL_TABLET | Freq: Every evening | ORAL | Status: DC
  Administered 2023-05-22: 50 mg via ORAL
  Filled 2023-05-22: qty 1

## 2023-05-22 MED ORDER — DOCUSATE SODIUM 100 MG PO CAPS: 100.0000 mg | ORAL_CAPSULE | Freq: Every day | ORAL | Status: DC | PRN

## 2023-05-22 MED ORDER — TIZANIDINE HCL 4 MG PO TABS
4.0000 mg | ORAL_TABLET | ORAL | Status: AC
  Administered 2023-05-22: 4 mg via ORAL
  Filled 2023-05-22: qty 1

## 2023-05-22 MED ORDER — MELATONIN 5 MG PO TABS
5.0000 mg | ORAL_TABLET | Freq: Every evening | ORAL | Status: DC | PRN
  Administered 2023-05-22: 5 mg via ORAL
  Filled 2023-05-22: qty 1

## 2023-05-22 MED ORDER — ACETAMINOPHEN 500 MG PO TABS
1000.0000 mg | ORAL_TABLET | ORAL | Status: AC
  Administered 2023-05-22: 1000 mg via ORAL
  Filled 2023-05-22: qty 2

## 2023-05-22 MED ORDER — LIDOCAINE 5 % EX PTCH
1.0000 | MEDICATED_PATCH | CUTANEOUS | Status: DC
  Administered 2023-05-22: 1 via TRANSDERMAL
  Filled 2023-05-22: qty 1

## 2023-05-22 MED ORDER — LACTATED RINGERS IV BOLUS (SEPSIS)
2000.0000 mL | Freq: Once | INTRAVENOUS | Status: AC
  Administered 2023-05-22: 2000 mL via INTRAVENOUS

## 2023-05-22 NOTE — Progress Notes (Signed)
PT Cancellation Note  Patient Details Name: Cheryl Barnes MRN: [id number] DOB: [birth year]   Cancelled Treatment:    Reason Eval/Treat Not Completed: Other (comment). Pt with frequent almost constant BM's. On bedpan initially. Assisted off with nsg. Before we could begin trying to mobilize she had to have bedpan again. Will try again tomorrow.    [name] [facility] 05/22/2023, 4:32 PM Skip Mayer PT Acute [employer] [phone]

## 2023-05-22 NOTE — Telephone Encounter (Signed)
Teams message Para March with vas advising of order in workq

## 2023-05-22 NOTE — ED Provider Notes (Signed)
[facility]ERG[facility] DEPARTMENT AT [facility] Provider Note   CSN: [id number] Arrival date & time: 05/22/23  5366     History  Chief Complaint  Patient presents with   Fever    Shaquay Perlstein is a 72 y.o. female.  72 year old female with history of hypertension and osteoarthritis status post right total hip placement on 05/13/2023 who presents emergency department with leg pain as well as fevers.  Patient reports that since her surgery she went home and has been having severe leg pain.  Says that the pain of her hip as well as the swelling of her leg has made it difficult to move her leg.  Also has been having subjective fevers with a Tmax of 100 F at home.  Denies any runny nose, sore throat, cough, shortness of breath, dysuria, or frequency.  No drainage from the surgical site.  Taking aspirin for DVT prophylaxis but no other blood thinners.       Home Medications Prior to Admission medications   Medication Sig Start Date End Date Taking? Authorizing Provider  amLODipine (NORVASC) 5 MG tablet TAKE 1 TABLET DAILY 11/13/22  Yes [place], [name], MD  aspirin EC 81 MG tablet Take 1 tablet (81 mg total) by mouth 2 (two) times daily. To be taken after surgery to prevent blood clots 05/06/23 05/05/24 Yes Cristie Hem, PA-C  cetirizine (ZYRTEC) 10 MG tablet Take 10 mg by mouth every evening.   Yes [provider]  docusate sodium (COLACE) 100 MG capsule Take 1 capsule (100 mg total) by mouth daily as needed. 05/06/23 05/05/24 Yes Cristie Hem, PA-C  Homeopathic Products (CLEAR TINNITUS) CAPS Take 1 capsule by mouth daily after supper. Silencil Max for Tinnitus   Yes [provider]  losartan (COZAAR) 50 MG tablet TAKE 1 TABLET DAILY Patient taking differently: Take 50 mg by mouth every evening. 04/15/23  Yes [place], [name], MD  melatonin 5 MG TABS Take 5 mg by mouth at bedtime as needed (sleep).   Yes [provider]  ondansetron (ZOFRAN) 4 MG  tablet Take 1 tablet (4 mg total) by mouth every 8 (eight) hours as needed for nausea or vomiting. 05/06/23  Yes Cristie Hem, PA-C  oxyCODONE-acetaminophen (PERCOCET) 5-325 MG tablet Take 1-2 tablets by mouth daily as needed. Patient taking differently: Take 2 tablets by mouth every 4 (four) hours as needed for moderate pain. 05/20/23  Yes Tarry Kos, MD  tiZANidine (ZANAFLEX) 4 MG tablet Take 1 tablet (4 mg total) by mouth 2 (two) times daily as needed for muscle spasms. Patient taking differently: Take 4 mg by mouth 3 (three) times daily. 05/20/23 05/19/24 Yes Tarry Kos, MD  valACYclovir (VALTREX) 1000 MG tablet Take 2 tablets by mouth as needed upon acute onset and asap repeat dose in 12 hours 05/20/23  Yes [place], [name], MD      Allergies    Penicillins, Seasonal ic [cholestatin], Tape, and Latex    Review of Systems   Review of Systems  Physical Exam Updated Vital Signs BP (!) 161/75   Pulse 76   Temp 98.3 F (36.8 C) (Oral)   Resp (!) 21   Ht 5\' 1"  (1.549 m)   Wt 70.3 kg   SpO2 96%   BMI 29.29 kg/m  Physical Exam Vitals and nursing note reviewed.  Constitutional:      General: She is not in acute distress.    Appearance: She is well-developed.  HENT:  Head: Normocephalic and atraumatic.     Right Ear: External ear normal.     Left Ear: External ear normal.     Nose: Nose normal.  Eyes:     Extraocular Movements: Extraocular movements intact.     Conjunctiva/sclera: Conjunctivae normal.     Pupils: Pupils are equal, round, and reactive to light.  Cardiovascular:     Rate and Rhythm: Normal rate and regular rhythm.     Heart sounds: No murmur heard. Pulmonary:     Effort: Pulmonary effort is normal. No respiratory distress.     Breath sounds: Normal breath sounds.  Musculoskeletal:     Cervical back: Normal range of motion and neck supple.     Right lower leg: Edema (2+) present.     Left lower leg: No edema.     Comments: DP pulses 2+ bilaterally.   Patient unable to flex right hip because of pain.  Intact sensation to light touch of right lower extremity.  Skin:    General: Skin is warm and dry.  Neurological:     Mental Status: She is alert and oriented to person, place, and time. Mental status is at baseline.  Psychiatric:        Mood and Affect: Mood normal.     ED Results / Procedures / Treatments   Labs (all labs ordered are listed, but only abnormal results are displayed) Labs Reviewed  CBC - Abnormal; Notable for the following components:      Result Value   RBC 3.13 (*)    Hemoglobin 9.7 (*)    HCT 29.5 (*)    All other components within normal limits  BASIC METABOLIC PANEL - Abnormal; Notable for the following components:   Glucose, Bld 145 (*)    Calcium 8.7 (*)    All other components within normal limits  URINALYSIS, W/ REFLEX TO CULTURE (INFECTION SUSPECTED) - Abnormal; Notable for the following components:   APPearance HAZY (*)    Leukocytes,Ua TRACE (*)    All other components within normal limits  SEDIMENTATION RATE - Abnormal; Notable for the following components:   Sed Rate 39 (*)    All other components within normal limits  C-REACTIVE PROTEIN - Abnormal; Notable for the following components:   CRP 2.2 (*)    All other components within normal limits  I-STAT CHEM 8, ED - Abnormal; Notable for the following components:   Glucose, Bld 142 (*)    Calcium, Ion 1.05 (*)    TCO2 21 (*)    Hemoglobin 9.9 (*)    HCT 29.0 (*)    All other components within normal limits  RESP PANEL BY RT-PCR (RSV, FLU A&B, COVID)  RVPGX2  CULTURE, BLOOD (ROUTINE X 2)  CULTURE, BLOOD (ROUTINE X 2)  I-STAT CG4 LACTIC ACID, ED  POC OCCULT BLOOD, ED  POC OCCULT BLOOD, ED    EKG EKG Interpretation Date/Time:  Wednesday May 22 2023 06:56:04 EDT Ventricular Rate:  65 PR Interval:  134 QRS Duration:  108 QT Interval:  463 QTC Calculation: 482 R Axis:   58  Text Interpretation: Sinus rhythm Ventricular premature  complex Borderline T abnormalities, anterior leads Confirmed by Vonita Moss [phone]) on 05/22/2023 7:14:25 AM  Radiology VAS [place] LOWER EXTREMITY VENOUS (DVT) (ONLY [facility] & [facility])  Result Date: 05/22/2023  Lower Venous DVT Study Patient Name:  [facility] ADAMS Coppock  Date of Exam:   05/22/2023 Medical Rec #: [id number]  Accession #:    [id number] Date of Birth: Feb 07, 1951            Patient Gender: F Patient Age:   49 years Exam Location:  [facility] Procedure:      VAS [place] LOWER EXTREMITY VENOUS (DVT) Referring Phys: Molly Maduro Nazaiah Navarrete --------------------------------------------------------------------------------  Indications: Pain.  Risk Factors: Right hip replacement 05/13/2023. Limitations: Poor ultrasound/tissue interface and extreme pain intolerance. Comparison Study: No previous exams Performing Technologist: Jody Hill RVT, RDMS  Examination Guidelines: A complete evaluation includes B-mode imaging, spectral Doppler, color Doppler, and power Doppler as needed of all accessible portions of each vessel. Bilateral testing is considered an integral part of a complete examination. Limited examinations for reoccurring indications may be performed as noted. The reflux portion of the exam is performed with the patient in reverse Trendelenburg.  +--------+---------------+---------+-----------+----------+--------------------+ RIGHT   CompressibilityPhasicitySpontaneityPropertiesThrombus Aging       +--------+---------------+---------+-----------+----------+--------------------+ CFV                  Yes      Yes                  patent by                                                                 color/doppler        +--------+---------------+---------+-----------+----------+--------------------+ SFJ                                                patent by color      +--------+---------------+---------+-----------+----------+--------------------+ FV Prox              Yes       Yes                  patent by                                                                 color/doppler        +--------+---------------+---------+-----------+----------+--------------------+ FV Mid               Yes      Yes                  patent by                                                                 color/doppler        +--------+---------------+---------+-----------+----------+--------------------+ FV                   Yes      Yes  patent by            Distal                                               color/doppler        +--------+---------------+---------+-----------+----------+--------------------+ PFV                  Yes      Yes                  patent by                                                                 color/doppler        +--------+---------------+---------+-----------+----------+--------------------+ POP                  Yes      Yes                  patent by                                                                 color/doppler        +--------+---------------+---------+-----------+----------+--------------------+ PTV     Full                                         Not well visualized  +--------+---------------+---------+-----------+----------+--------------------+ PERO    Full                                         Not well visualized  +--------+---------------+---------+-----------+----------+--------------------+   Right Technical Findings:   Patient unable to tolerate any compression from groin to knee. Barely able to tolerate probe on skin.  +----+---------------+---------+-----------+----------+--------------+ LEFTCompressibilityPhasicitySpontaneityPropertiesThrombus Aging +----+---------------+---------+-----------+----------+--------------+ CFV Full           Yes      Yes                                  +----+---------------+---------+-----------+----------+--------------+    Summary: RIGHT: - There is no evidence of deep vein thrombosis in the lower extremity. However, portions of this examination were limited- see technologist comments above.  - No cystic structure found in the popliteal fossa.  LEFT: - No evidence of common femoral vein obstruction.   *See table(s) above for measurements and observations.    Preliminary    DG Femur Min 2 Views Right  Result Date: 05/22/2023 CLINICAL DATA:  Right leg pain after right total hip arthroplasty 8 days ago. EXAM: RIGHT FEMUR 2 VIEWS COMPARISON:  May 13, 2023. FINDINGS: The right femoral and acetabular components are well situated. No fracture or dislocation is  noted. IMPRESSION: No acute abnormality seen. Electronically Signed   By: Lupita Raider M.D.   On: 05/22/2023 08:39   DG Chest 1 View  Result Date: 05/22/2023 CLINICAL DATA:  Chest pain. EXAM: CHEST  1 VIEW COMPARISON:  Jan 24, 2017. FINDINGS: The heart size and mediastinal contours are within normal limits. Both lungs are clear. The visualized skeletal structures are unremarkable. IMPRESSION: No active disease. Electronically Signed   By: Lupita Raider M.D.   On: 05/22/2023 08:37    Procedures Procedures    Medications Ordered in ED Medications  lidocaine (LIDODERM) 5 % 1 patch (1 patch Transdermal Patch Applied 05/22/23 1034)  lidocaine (LIDODERM) 5 % 1 patch (1 patch Transdermal Patch Applied 05/22/23 1258)  oxyCODONE (Oxy IR/ROXICODONE) immediate release tablet 5 mg (5 mg Oral Given 05/22/23 1557)  acetaminophen (TYLENOL) tablet 650 mg (has no administration in time range)  lactated ringers bolus 2,000 mL (0 mLs Intravenous Stopped 05/22/23 1014)  acetaminophen (TYLENOL) tablet 1,000 mg (1,000 mg Oral Given 05/22/23 0758)  HYDROmorphone (DILAUDID) injection 0.5 mg (0.5 mg Intravenous Given 05/22/23 0801)  HYDROmorphone (DILAUDID) injection 0.5 mg (0.5 mg Intravenous Given 05/22/23  0914)  tiZANidine (ZANAFLEX) tablet 4 mg (4 mg Oral Given 05/22/23 1609)    ED Course/ Medical Decision Making/ A&P Clinical Course as of 05/22/23 1954  Wed May 22, 2023  0739 Hemoglobin(!): 9.7 Baseline of 13 [RP]  0901 Technician states that patient was negative for DVT [RP]  0920 Patient states she has not had any blood in her stool [RP]  1027 Micheal Jeffery from ortho to evaluate the patient's hip [RP]  1119 Earney Hamburg from orthopedics as discussed with the patient's surgeon.  Feels that it is likely postoperative pain and low concern for infection at this time.  Does not feel any additional diagnostic workup is required currently. [RP]  1156 Dr Maisie Fus consulted regarding admission.  [RP]    Clinical Course User Index [RP] Rondel Baton, MD                                 Medical Decision Making Amount and/or Complexity of Data Reviewed Labs: ordered. Decision-making details documented in ED Course. Radiology: ordered.  Risk OTC drugs. Prescription drug management.   Kenedie Gajda is a 72 y.o. female with comorbidities that complicate the patient evaluation including hypertension and osteoarthritis status post right total hip placement on 05/13/2023 who presents emergency department with leg pain as well as fevers.   Initial Ddx:  Postop infection, sepsis, urinary tract infection, pneumonia, DVT  MDM/Course:  Patient presents to the emergency department with right hip pain approximately 11 days after her operation.  Pain limits range of motion of the leg.  Initially was concerned about possible postop infection so cultures and inflammatory markers were sent.  Also on the differential for subjective fevers early postop with DVT as well as pneumonia.  X-ray of the leg and chest x-ray did not reveal acute abnormalities.  DVT ultrasound is negative.  White blood cell count was normal and patient was afebrile on arrival to the emergency department but did have mildly  elevated inflammatory markers.  Urinalysis and COVID and flu to check for other sources of infection were negative.  Upon re-evaluation was still having significant pain was given several rounds of pain medication.  Was noted to be anemic which I suspect is postoperative losses.  Did have a  Hemoccult performed in the emergency department that was negative.  Orthopedics came down in person and evaluated the patient and discussed that with her surgeon.  Was felt that postoperative infection was highly unlikely and this is likely postoperative pain.  Since patient lives at home by herself and is having difficulty getting around we will have physical therapy evaluate the patient.  Placed in TOC boarder status.  Prior to this was discussed with medicine for admission who did not feel that she met criteria at this time.  This patient presents to the ED for concern of complaints listed in HPI, this involves an extensive number of treatment options, and is a complaint that carries with it a high risk of complications and morbidity. Disposition including potential need for admission considered.   Dispo: TOC border  Additional history obtained from family Records reviewed Outpatient Clinic Notes The following labs were independently interpreted: Chemistry and CBC and show acute anemia I independently reviewed the following imaging with scope of interpretation limited to determining acute life threatening conditions related to emergency care: Extremity x-ray(s) and agree with the radiologist interpretation with the following exceptions: none I personally reviewed and interpreted cardiac monitoring: normal sinus rhythm  I personally reviewed and interpreted the pt's EKG: see above for interpretation  I have reviewed the patients home medications and made adjustments as needed Consults: Hospitalist and Orthopedics Social Determinants of health:  Elderly         Final Clinical Impression(s) / ED  Diagnoses Final diagnoses:  Subjective fever  History of total right hip replacement  Postoperative pain  Localized swelling of right lower extremity    Rx / DC Orders ED Discharge Orders     None         Rondel Baton, MD 05/22/23 1954

## 2023-05-22 NOTE — Telephone Encounter (Signed)
-----   Message from Wendi Maya sent at 05/22/2023  9:06 AM EDT ----- Ultrasound to rule out DVT has been ordered.

## 2023-05-22 NOTE — ED Triage Notes (Signed)
Pt had right hip replacement on 05/13/23 and was able to move the leg some for a couple of days. Since the third day she has not been able to move it, it hurts a lot, and she has been having fevers off and on.

## 2023-05-22 NOTE — Consult Note (Signed)
Reason for Consult:Right hip pain Referring Physician: Vonita Moss Time called: 1028 Time at bedside: 1035   Cheryl Barnes is an 72 y.o. female.  HPI: Cheryl Barnes is s/p THA by Dr. Roda Shutters 9/16. She did moderately well for about 3d but the pain became much worse and has waxed and waned since then and she's been unable to bear weight. She was brought to the ED today for evaluation and orthopedic surgery was consulted.  Past Medical History:  Diagnosis Date   Allergy    Diverticulitis    History of hiatal hernia    Hypertension    LBBB (left bundle branch block)    Migraine    history   Nonobstructive atherosclerosis of coronary artery    Osteoarthritis, hip, bilateral     Past Surgical History:  Procedure Laterality Date   CESAREAN SECTION     x2   CHOLECYSTECTOMY     PARTIAL HYSTERECTOMY     Abdominal   ROTATOR CUFF REPAIR Left    ROTATOR CUFF REPAIR Right    TONSILLECTOMY     removed as a child   TOTAL HIP ARTHROPLASTY Right 05/13/2023   Procedure: RIGHT TOTAL HIP REPLACEMENT;  Surgeon: Tarry Kos, MD;  Location: [facility] OR;  Service: Orthopedics;  Laterality: Right;  3-C   TYMPANOSTOMY TUBE PLACEMENT Right 2022    Family History  Problem Relation Age of Onset   Hypertension Father    Diabetes Father        adult onset   Colon cancer Father    Hypertension Mother    Breast cancer Mother        breast   Cancer Mother        breast   Asthma Maternal Grandfather    Hypertension Brother    Diabetes Brother    Stomach cancer Neg Hx    Pancreatic cancer Neg Hx     Social History:  reports that she has never smoked. She has never used smokeless tobacco. She reports that she does not drink alcohol and does not use drugs.  Allergies:  Allergies  Allergen Reactions   Penicillins Itching and Rash    Has patient had a PCN reaction causing immediate rash, facial/tongue/throat swelling, SOB or lightheadedness with hypotension: Yes Has patient had a PCN reaction causing  severe rash involving mucus membranes or skin necrosis: No Has patient had a PCN reaction that required hospitalization No Has patient had a PCN reaction occurring within the last 10 years: No If all of the above answers are "NO", then may proceed with Cephalosporin use.    Seasonal Ic [Cholestatin] Other (See Comments)    Upper resp, sore throat, sneezing, etc.   Tape Other (See Comments)    Tape causes some redness & causes BLISTERS if left on for too long   Latex Itching and Rash    Medications: I have reviewed the patient's current medications.  Results for orders placed or performed during the hospital encounter of 05/22/23 (from the past 48 hour(s))  CBC     Status: Abnormal   Collection Time: 05/22/23  6:54 AM  Result Value Ref Range   WBC 5.1 4.0 - 10.5 K/uL   RBC 3.13 (L) 3.87 - 5.11 MIL/uL   Hemoglobin 9.7 (L) 12.0 - 15.0 g/dL   HCT 29.5 (L) 18.8 - 41.6 %   MCV 94.2 80.0 - 100.0 fL   MCH 31.0 26.0 - 34.0 pg   MCHC 32.9 30.0 - 36.0 g/dL   RDW  13.7 11.5 - 15.5 %   Platelets 213 150 - 400 K/uL   nRBC 0.0 0.0 - 0.2 %    Comment: Performed at [facility], [address], [place], [place] [address]  Basic metabolic panel     Status: Abnormal   Collection Time: 05/22/23  6:54 AM  Result Value Ref Range   Sodium 137 135 - 145 mmol/L   Potassium 3.6 3.5 - 5.1 mmol/L   Chloride 106 98 - 111 mmol/L   CO2 22 22 - 32 mmol/L   Glucose, Bld 145 (H) 70 - 99 mg/dL    Comment: Glucose reference range applies only to samples taken after fasting for at least 8 hours.   BUN 21 8 - 23 mg/dL   Creatinine, Ser 8.75 0.44 - 1.00 mg/dL   Calcium 8.7 (L) 8.9 - 10.3 mg/dL   GFR, Estimated >64 >33 mL/min    Comment: (NOTE) Calculated using the CKD-EPI Creatinine Equation (2021)    Anion gap 9 5 - 15    Comment: Performed at [facility], [address] [place], [place] [address]  I-stat chem 8, ED (not at [facility], [facility] or [facility])     Status: Abnormal   Collection Time: 05/22/23   7:09 AM  Result Value Ref Range   Sodium 140 135 - 145 mmol/L   Potassium 3.7 3.5 - 5.1 mmol/L   Chloride 107 98 - 111 mmol/L   BUN 22 8 - 23 mg/dL   Creatinine, Ser 8.41 0.44 - 1.00 mg/dL   Glucose, Bld 660 (H) 70 - 99 mg/dL    Comment: Glucose reference range applies only to samples taken after fasting for at least 8 hours.   Calcium, Ion 1.05 (L) 1.15 - 1.40 mmol/L   TCO2 21 (L) 22 - 32 mmol/L   Hemoglobin 9.9 (L) 12.0 - 15.0 g/dL   HCT 63.0 (L) 16.0 - 10.9 %  I-Stat CG4 Lactic Acid     Status: None   Collection Time: 05/22/23  7:10 AM  Result Value Ref Range   Lactic Acid, Venous 1.5 0.5 - 1.9 mmol/L  Resp panel by RT-PCR (RSV, Flu A&B, Covid) Anterior Nasal Swab     Status: None   Collection Time: 05/22/23  7:24 AM   Specimen: Anterior Nasal Swab  Result Value Ref Range   SARS Coronavirus 2 by RT PCR NEGATIVE NEGATIVE   Influenza A by PCR NEGATIVE NEGATIVE   Influenza B by PCR NEGATIVE NEGATIVE    Comment: (NOTE) The Xpert Xpress SARS-CoV-2/FLU/RSV plus assay is intended as an aid in the diagnosis of influenza from Nasopharyngeal swab specimens and should not be used as a sole basis for treatment. Nasal washings and aspirates are unacceptable for Xpert Xpress SARS-CoV-2/FLU/RSV testing.  Fact Sheet for Patients: BloggerCourse.com  Fact Sheet for Healthcare Providers: SeriousBroker.it  This test is not yet approved or cleared by the [place] FDA and has been authorized for detection and/or diagnosis of SARS-CoV-2 by FDA under an Emergency Use Authorization (EUA). This EUA will remain in effect (meaning this test can be used) for the duration of the COVID-19 declaration under Section 564(b)(1) of the Act, 21 U.S.C. section 360bbb-3(b)(1), unless the authorization is terminated or revoked.     Resp Syncytial Virus by PCR NEGATIVE NEGATIVE    Comment: (NOTE) Fact Sheet for  Patients: BloggerCourse.com  Fact Sheet for Healthcare Providers: SeriousBroker.it  This test is not yet approved or cleared by the [place] and  has been authorized for detection and/or diagnosis of SARS-CoV-2 by FDA under an Emergency Use Authorization (EUA). This EUA will remain in effect (meaning this test can be used) for the duration of the COVID-19 declaration under Section 564(b)(1) of the Act, 21 U.S.C. section 360bbb-3(b)(1), unless the authorization is terminated or revoked.  Performed at [facility], [address], [place], [place] [address]   Sedimentation rate     Status: Abnormal   Collection Time: 05/22/23  7:24 AM  Result Value Ref Range   Sed Rate 39 (H) 0 - 22 mm/hr    Comment: Performed at [facility], [address], [place], [place] [address]  C-reactive protein     Status: Abnormal   Collection Time: 05/22/23  7:24 AM  Result Value Ref Range   CRP 2.2 (H) <1.0 mg/dL    Comment: Performed at [facility], [address], [place], [place] [address]  Urinalysis, w/ Reflex to Culture (Infection Suspected) -Urine, Clean Catch     Status: Abnormal   Collection Time: 05/22/23  9:36 AM  Result Value Ref Range   Specimen Source URINE, CLEAN CATCH    Color, Urine YELLOW YELLOW   APPearance HAZY (A) CLEAR   Specific Gravity, Urine 1.029 1.005 - 1.030   pH 5.0 5.0 - 8.0   Glucose, UA NEGATIVE NEGATIVE mg/dL   Hgb urine dipstick NEGATIVE NEGATIVE   Bilirubin Urine NEGATIVE NEGATIVE   Ketones, ur NEGATIVE NEGATIVE mg/dL   Protein, ur NEGATIVE NEGATIVE mg/dL   Nitrite NEGATIVE NEGATIVE   Leukocytes,Ua TRACE (A) NEGATIVE   RBC / HPF 0-5 0 - 5 RBC/hpf   WBC, UA 6-10 0 - 5 WBC/hpf    Comment:        Reflex urine culture not performed if WBC <=10, OR if Squamous epithelial cells >5. If Squamous epithelial cells >5 suggest recollection.    Bacteria, UA NONE SEEN NONE SEEN   Squamous  Epithelial / HPF 0-5 0 - 5 /HPF   Mucus PRESENT    Hyaline Casts, UA PRESENT     Comment: Performed at [facility], [address], [place], [place] [address]    VAS [place] LOWER EXTREMITY VENOUS (DVT) (ONLY [facility] & [facility])  Result Date: 05/22/2023  Lower Venous DVT Study Patient Name:  [facility] AD[facility] Weisheit  Date of Exam:   05/22/2023 Medical Rec #: [id number]            Accession #:    [id number] Date of Birth: 1950/10/10            Patient Gender: F Patient Age:   89 years Exam Location:  [facility] Procedure:      VAS [place] LOWER EXTREMITY VENOUS (DVT) Referring Phys: Molly Maduro PATERSON --------------------------------------------------------------------------------  Indications: Pain.  Risk Factors: Right hip replacement 05/13/2023. Limitations: Poor ultrasound/tissue interface and extreme pain intolerance. Comparison Study: No previous exams Performing Technologist: Jody Hill RVT, RDMS  Examination Guidelines: A complete evaluation includes B-mode imaging, spectral Doppler, color Doppler, and power Doppler as needed of all accessible portions of each vessel. Bilateral testing is considered an integral part of a complete examination. Limited examinations for reoccurring indications may be performed as noted. The reflux portion of the exam is performed with the patient in reverse Trendelenburg.  +--------+---------------+---------+-----------+----------+--------------------+ RIGHT   CompressibilityPhasicitySpontaneityPropertiesThrombus Aging       +--------+---------------+---------+-----------+----------+--------------------+ CFV                  Yes  Yes                  patent by                                                                 color/doppler        +--------+---------------+---------+-----------+----------+--------------------+ SFJ                                                patent by color       +--------+---------------+---------+-----------+----------+--------------------+ FV Prox              Yes      Yes                  patent by                                                                 color/doppler        +--------+---------------+---------+-----------+----------+--------------------+ FV Mid               Yes      Yes                  patent by                                                                 color/doppler        +--------+---------------+---------+-----------+----------+--------------------+ FV                   Yes      Yes                  patent by            Distal                                               color/doppler        +--------+---------------+---------+-----------+----------+--------------------+ PFV                  Yes      Yes                  patent by  color/doppler        +--------+---------------+---------+-----------+----------+--------------------+ POP                  Yes      Yes                  patent by                                                                 color/doppler        +--------+---------------+---------+-----------+----------+--------------------+ PTV     Full                                         Not well visualized  +--------+---------------+---------+-----------+----------+--------------------+ PERO    Full                                         Not well visualized  +--------+---------------+---------+-----------+----------+--------------------+   Right Technical Findings:   Patient unable to tolerate any compression from groin to knee. Barely able to tolerate probe on skin.  +----+---------------+---------+-----------+----------+--------------+ LEFTCompressibilityPhasicitySpontaneityPropertiesThrombus Aging  +----+---------------+---------+-----------+----------+--------------+ CFV Full           Yes      Yes                                 +----+---------------+---------+-----------+----------+--------------+    Summary: RIGHT: - There is no evidence of deep vein thrombosis in the lower extremity. However, portions of this examination were limited- see technologist comments above.  - No cystic structure found in the popliteal fossa.  LEFT: - No evidence of common femoral vein obstruction.   *See table(s) above for measurements and observations.    Preliminary    DG Femur Min 2 Views Right  Result Date: 05/22/2023 CLINICAL DATA:  Right leg pain after right total hip arthroplasty 8 days ago. EXAM: RIGHT FEMUR 2 VIEWS COMPARISON:  May 13, 2023. FINDINGS: The right femoral and acetabular components are well situated. No fracture or dislocation is noted. IMPRESSION: No acute abnormality seen. Electronically Signed   By: Lupita Raider M.D.   On: 05/22/2023 08:39   DG Chest 1 View  Result Date: 05/22/2023 CLINICAL DATA:  Chest pain. EXAM: CHEST  1 VIEW COMPARISON:  Jan 24, 2017. FINDINGS: The heart size and mediastinal contours are within normal limits. Both lungs are clear. The visualized skeletal structures are unremarkable. IMPRESSION: No active disease. Electronically Signed   By: Lupita Raider M.D.   On: 05/22/2023 08:37    Review of Systems  Constitutional:  Positive for fever. Negative for chills and diaphoresis.  HENT:  Negative for ear discharge, ear pain, hearing loss and tinnitus.   Eyes:  Negative for photophobia and pain.  Respiratory:  Negative for cough and shortness of breath.   Cardiovascular:  Negative for chest pain.  Gastrointestinal:  Negative for abdominal pain, nausea and vomiting.  Genitourinary:  Negative for dysuria, flank pain, frequency and urgency.  Musculoskeletal:  Positive for arthralgias (Right hip). Negative for back pain, myalgias  and neck pain.   Neurological:  Negative for dizziness and headaches.  Hematological:  Does not bruise/bleed easily.  Psychiatric/Behavioral:  The patient is not nervous/anxious.    Blood pressure (!) 171/74, pulse 97, temperature 98.2 F (36.8 C), resp. rate (!) 26, height 5\' 1"  (1.549 m), weight 70.3 kg, SpO2 97%. Physical Exam Constitutional:      General: She is not in acute distress.    Appearance: She is well-developed. She is not diaphoretic.  HENT:     Head: Normocephalic and atraumatic.  Eyes:     General: No scleral icterus.       Right eye: No discharge.        Left eye: No discharge.     Conjunctiva/sclera: Conjunctivae normal.  Cardiovascular:     Rate and Rhythm: Normal rate and regular rhythm.  Pulmonary:     Effort: Pulmonary effort is normal. No respiratory distress.  Musculoskeletal:     Cervical back: Normal range of motion.     Comments: RLE No traumatic wounds, ecchymosis, or rash  Incision C/D/I, able to PROM about 15 degrees without pain  No knee or ankle effusion  Knee stable to varus/ valgus and anterior/posterior stress  Sens DPN, SPN, TN intact  Motor EHL, ext, flex, evers 5/5  DP 2+, PT 2+, 2+ pedal edema  Skin:    General: Skin is warm and dry.  Neurological:     Mental Status: She is alert.  Psychiatric:        Mood and Affect: Mood normal.        Behavior: Behavior normal.     Assessment/Plan: Right hip pain -- Suspect this is just undertreated post-surgical pain. Nothing to suggest infection or hardware failure. Agree with admission for pain control and mobilization. F/u with Dr. Roda Shutters as scheduled.    Freeman Caldron, PA-C Orthopedic Surgery [phone] 05/22/2023, 10:45 AM

## 2023-05-22 NOTE — ED Notes (Signed)
Patient transported to vascular ultrasound

## 2023-05-22 NOTE — Progress Notes (Signed)
RLE venous duplex has been completed.  Preliminary results given to Dr. Eloise Harman.   Results can be found under chart review under CV PROC. 05/22/2023 9:02 AM Cheyan Frees RVT, RDMS

## 2023-05-23 ENCOUNTER — Other Ambulatory Visit: Payer: Self-pay | Admitting: Physician Assistant

## 2023-05-23 ENCOUNTER — Telehealth: Payer: Self-pay | Admitting: Orthopaedic Surgery

## 2023-05-23 DIAGNOSIS — R509 Fever, unspecified: Secondary | ICD-10-CM | POA: Diagnosis not present

## 2023-05-23 LAB — CULTURE, BLOOD (ROUTINE X 2)
Special Requests: ADEQUATE
Special Requests: ADEQUATE

## 2023-05-23 MED ORDER — TIZANIDINE HCL 4 MG PO TABS: 4.0000 mg | ORAL_TABLET | Freq: Two times a day (BID) | ORAL | 2 refills | Status: DC | PRN

## 2023-05-23 MED ORDER — OXYCODONE-ACETAMINOPHEN 5-325 MG PO TABS: 1.0000 | ORAL_TABLET | Freq: Four times a day (QID) | ORAL | 0 refills | Status: DC | PRN

## 2023-05-23 NOTE — Progress Notes (Signed)
Went by to see Cheryl Barnes this morning.  She's doing better in terms of pain control.  We'll try to mobilize with PT today.  The surgical area is not concerning on exam.  BLE NVI.  Anticipate that she'll be able to discharge home today after PT and resume HHPT.  Follow up in the office as scheduled on 05/28/23.   Mayra Reel, MD [facility] 7:30 AM

## 2023-05-23 NOTE — ED Notes (Signed)
Patient c/o increasing pain to R hip/leg.  Requesting pain medications at this time

## 2023-05-23 NOTE — ED Notes (Signed)
PT at bedside.

## 2023-05-23 NOTE — ED Notes (Signed)
Patient placed on bedpan multiple times.  Shortly after being removed from bedpan, patient reports having a BM in the bed.  Patient hygiene performed and new sheets placed on bed.

## 2023-05-23 NOTE — Telephone Encounter (Signed)
Sent both to wg on file.  Please do not take more frequently than what is on rx

## 2023-05-23 NOTE — Discharge Planning (Signed)
Pt currently active with [facility] for Home Health services PT as confirmed by [facility] with Patient Ping.  Pt will resume HH services of PT. No DME needs identified at this time.

## 2023-05-23 NOTE — Telephone Encounter (Signed)
Patient called needing Rx refilled Oxycodone and Tizanidine  The number to contact patient is [phone]

## 2023-05-23 NOTE — ED Notes (Signed)
Placed pt on bedpan. Able to roll independently but required assistance moving her R leg d/t post op pain. Awaiting PT eval. Breakfast at bedside.

## 2023-05-23 NOTE — Discharge Instructions (Signed)
Follow-up with your orthopedist as directed.  Return to emergency room if you have any worsening symptoms.

## 2023-05-23 NOTE — ED Notes (Signed)
Patient resting quietly with even and unlabored respirations

## 2023-05-23 NOTE — ED Provider Notes (Signed)
Emergency Medicine Observation Re-evaluation Note  Cheryl Barnes is a 72 y.o. female, seen on rounds today.  Pt initially presented to the ED for complaints of Fever Currently, the patient is ending up in bed.  Physical Exam  BP (!) 149/64 (BP Location: Right Arm)   Pulse 90   Temp 98.9 F (37.2 C) (Axillary)   Resp 20   Ht 5\' 1"  (1.549 m)   Wt 70.3 kg   SpO2 96%   BMI 29.29 kg/m  Physical Exam General: No acute distress Cardiac: Normal rate Lungs: No increased work of breathing Psych: Calm  ED Course / MDM  EKG:EKG Interpretation Date/Time:  Wednesday May 22 2023 06:56:04 EDT Ventricular Rate:  65 PR Interval:  134 QRS Duration:  108 QT Interval:  463 QTC Calculation: 482 R Axis:   58  Text Interpretation: Sinus rhythm Ventricular premature complex Borderline T abnormalities, anterior leads Confirmed by Vonita Moss [id number]) on 05/22/2023 7:14:25 AM  I have reviewed the labs performed to date as well as medications administered while in observation.  Recent changes in the last 24 hours include none.  Plan  Current plan is for reevaluation by PT.  Patient has been seen by both her orthopedist and PT this morning.  PT evaluation was successful and both are recommending that patient be discharged home with ongoing PT at home.  Patient has people at home to help and has PT already set up per her report.  Her work appears been nonconcerning.  She was discharged home and will follow-up with Ortho.  Return for any worsening symptoms.    Rolan Bucco, MD 05/23/23 [phone]

## 2023-05-23 NOTE — Evaluation (Signed)
Physical Therapy Evaluation Patient Details Name: Cheryl Barnes MRN: [id number] DOB: 05/22/1951 Today's Date: 05/23/2023  History of Present Illness  72 y.o. female seen in ED 05/22/23 with leg pain and fevers and swelling.  Had R THA on 05/13/2023.  PMH includes HTN, diverticulosis, LBBB, HLD.  Clinical Impression  Patient presents with mobility close to baseline though with some edema and pain she did not feel initially post surgery.  Education on typical course, activity progression and encouraged to continue progressing as able.  Patient appropriate for home with family support and to resume follow up HHPT.         If plan is discharge home, recommend the following: A little help with bathing/dressing/bathroom;Assist for transportation;Help with stairs or ramp for entrance   Can travel by private vehicle        Equipment Recommendations None recommended by PT  Recommendations for Other Services       Functional Status Assessment Patient has not had a recent decline in their functional status     Precautions / Restrictions Precautions Precautions: Fall Precaution Comments: direct anterior THA Restrictions RLE Weight Bearing: Weight bearing as tolerated      Mobility  Bed Mobility Overal bed mobility: Needs Assistance Bed Mobility: Supine to Sit     Supine to sit: Min assist Sit to supine: Min assist   General bed mobility comments: assist for R LE off EOB and cues for managing UE's as pt using rails for supine to sit, used my gait belt to lift L LE into bed to supine, needed help for R LE into bed    Transfers Overall transfer level: Needs assistance Equipment used: Rolling walker (2 wheels) Transfers: Sit to/from Stand Sit to Stand: Supervision           General transfer comment: able to verbalize and demonstrate safe technique    Ambulation/Gait Ambulation/Gait assistance: Supervision Gait Distance (Feet): 200 Feet Assistive device: Rolling walker  (2 wheels) Gait Pattern/deviations: Step-to pattern, Step-through pattern, Decreased stride length, Antalgic       General Gait Details: mild antalgia and slower speed  Stairs         General stair comments: Reviewed stair technique, pt verbalized understanding and reports has sister to assist at home.  Declined practice due to pain in hip after ambulation  Wheelchair Mobility     Tilt Bed    Modified Rankin (Stroke Patients Only)       Balance Overall balance assessment: Needs assistance Sitting-balance support: Feet supported Sitting balance-Leahy Scale: Good     Standing balance support: Bilateral upper extremity supported, Single extremity supported Standing balance-Leahy Scale: Fair Standing balance comment: can stand without UE support staticially, when trying to adjust brief she had mild LOB grabbing walker to steady herself                             Pertinent Vitals/Pain Pain Assessment Pain Assessment: 0-10 Pain Score: 8  Pain Location: R hip Pain Descriptors / Indicators: Sore Pain Intervention(s): Monitored during session, Repositioned    Home Living Family/patient expects to be discharged to:: Private residence Living Arrangements: Other relatives (sister staying with her right now) Available Help at Discharge: Family Type of Home: House Home Access: Stairs to enter Entrance Stairs-Rails: Can reach both Entrance Stairs-Number of Steps: 3     Home Equipment: Agricultural consultant (2 wheels)      Prior Function Prior Level of Function : Needs  assist             Mobility Comments: was working with HHPT and getting help for bathing and stairs       Extremity/Trunk Assessment   Upper Extremity Assessment Upper Extremity Assessment: Overall WFL for tasks assessed    Lower Extremity Assessment Lower Extremity Assessment: RLE deficits/detail RLE Deficits / Details: AAROM hip and knee limited in supine with edema and pain; able to  flex ankle though also with edema present    Cervical / Trunk Assessment Cervical / Trunk Assessment: Kyphotic  Communication   Communication Communication: No apparent difficulties  Cognition Arousal: Alert Behavior During Therapy: WFL for tasks assessed/performed Overall Cognitive Status: Within Functional Limits for tasks assessed                                          General Comments General comments (skin integrity, edema, etc.): Reviewd activity progression, use of ice and meds and for resuming HHPT    Exercises Total Joint Exercises Ankle Circles/Pumps: AROM, 5 reps, Right, Left, Supine Heel Slides: AAROM, Right, Supine, 5 reps   Assessment/Plan    PT Assessment All further PT needs can be met in the next venue of care  PT Problem List         PT Treatment Interventions      PT Goals (Current goals can be found in the Care Plan section)  Acute Rehab PT Goals PT Goal Formulation: All assessment and education complete, DC therapy    Frequency       Co-evaluation               AM-PAC PT "6 Clicks" Mobility  Outcome Measure Help needed turning from your back to your side while in a flat bed without using bedrails?: A Little Help needed moving from lying on your back to sitting on the side of a flat bed without using bedrails?: A Little Help needed moving to and from a bed to a chair (including a wheelchair)?: None Help needed standing up from a chair using your arms (e.g., wheelchair or bedside chair)?: None Help needed to walk in hospital room?: None Help needed climbing 3-5 steps with a railing? : A Little 6 Click Score: 21    End of Session Equipment Utilized During Treatment: Gait belt Activity Tolerance: Patient tolerated treatment well Patient left: in bed;with call bell/phone within reach   PT Visit Diagnosis: Other abnormalities of gait and mobility (R26.89);Muscle weakness (generalized) (M62.81);Pain Pain - Right/Left:  Right Pain - part of body: Hip    Time: 0928-1000 PT Time Calculation (min) (ACUTE ONLY): 32 min   Charges:   PT Evaluation $PT Eval Low Complexity: 1 Low PT Treatments $Gait Training: 8-22 mins PT General Charges $$ ACUTE PT VISIT: 1 Visit         Sheran Lawless, PT Acute Rehabilitation Services Office:[phone] 05/23/2023   Elray Mcgregor 05/23/2023, 10:13 AM

## 2023-05-24 ENCOUNTER — Telehealth: Payer: Self-pay | Admitting: Orthopaedic Surgery

## 2023-05-24 NOTE — Telephone Encounter (Signed)
Left verbal on voice mail

## 2023-05-24 NOTE — Telephone Encounter (Signed)
Patient notified

## 2023-05-24 NOTE — Telephone Encounter (Signed)
Charrisse (PT) called from [facility] requesting rebal orders 1 wk I and pt will discharge next wk. Charrisse phone number is [phone]

## 2023-05-27 ENCOUNTER — Telehealth (HOSPITAL_COMMUNITY): Payer: Self-pay | Admitting: Emergency Medicine

## 2023-05-27 ENCOUNTER — Telehealth: Payer: Self-pay

## 2023-05-27 LAB — CULTURE, BLOOD (ROUTINE X 2): Culture: NO GROWTH

## 2023-05-27 NOTE — Telephone Encounter (Cosign Needed Addendum)
  Notified by charge RN that 1/4 patient's patients blood cultures (anaerobic bottle) positive with gram + rods.  May be contaminant, however she did have recent hip surgery 05/13/23 and repeat ED visit 05/22/23 for leg pain and fevers.  Patient was called and notified of results, encouraged to return to the ED.  Voiced she would need a ride and not sure when someone would be able to bring her but would try to return to the ED later today.

## 2023-05-27 NOTE — Telephone Encounter (Signed)
Spoke with Charisse at [facility].  S/p Right THA 05/13/2023. Patient is still having pain and swelling. She said that her swelling limits her. No pain with rest. She is scheduled to come in tomorrow for a follow up. Just an FYI.

## 2023-05-28 ENCOUNTER — Encounter: Payer: Medicare Other | Admitting: Physician Assistant

## 2023-05-28 ENCOUNTER — Encounter: Payer: Self-pay | Admitting: Physician Assistant

## 2023-05-28 ENCOUNTER — Ambulatory Visit (INDEPENDENT_AMBULATORY_CARE_PROVIDER_SITE_OTHER): Payer: Medicare Other | Admitting: Orthopaedic Surgery

## 2023-05-28 DIAGNOSIS — Z96641 Presence of right artificial hip joint: Secondary | ICD-10-CM

## 2023-05-28 NOTE — Progress Notes (Signed)
Post-Op Visit Note   Patient: Cheryl Barnes           Date of Birth: March 29, 1951           MRN: [id number] Visit Date: 05/28/2023 PCP: [place], [name], MD   Assessment & Plan:  Chief Complaint:  Chief Complaint  Patient presents with   Right Hip - Follow-up    Right total hip arthroplasty 05/13/2023   Visit Diagnoses:  1. Status post total replacement of right hip     Plan: Cheryl Barnes is 2-week status post right total hip replacement.  She is doing well overall.  She is making slow progress.  She is still on a walker.  Her main complaint is pain in the leg due to the swelling.  Exam of the right lower extremity shows moderate diffuse swelling and pitting edema.  No neurovascular compromise.  Ultrasound was negative for DVT.  Sutures removed Steri-Strips applied.  Strongly encouraged the regular use of TED hose as the swelling is the main contributor to her pain.  Instructions reviewed.  Recheck in 4 weeks with repeat x-rays.  Follow-Up Instructions: Return in about 4 weeks (around 06/25/2023) for Cheryl Barnes.   Orders:  No orders of the defined types were placed in this encounter.  No orders of the defined types were placed in this encounter.   Imaging: No results found.  PMFS History: Patient Active Problem List   Diagnosis Date Noted   Status post total replacement of right hip 05/13/2023   Primary osteoarthritis of right hip 05/12/2023   Primary osteoarthritis of left hip 02/06/2023   Statin myopathy 10/24/2022   Atherosclerosis of aorta (HCC) 10/17/2020   Nocturnal leg cramps 10/01/2018   Splenic artery aneurysm (HCC) 03/01/2017   Thyroid nodule 01/27/2017   Acute diverticulitis 01/24/2017   Recurrent herpes labialis 09/21/2016   Pure hypercholesterolemia 09/05/2016   Class 1 obesity with body mass index (BMI) of 31.0 to 31.9 in adult 09/05/2016   Bilateral hip pain 09/14/2015   Medicare annual wellness visit, subsequent 09/14/2015   Benign  hypertension 07/17/2015   Sigmoid diverticulitis 01/27/2015   LBBB (left bundle branch block) 12/23/2014   Hematochezia 12/23/2014   Abdominal pain    Diverticulitis of large intestine without perforation or abscess without bleeding    Chest pain    Solitary pulmonary nodule 06/09/2014   Left lower lobe pneumonia 10/06/2013   Urinary incontinence in female 03/02/2013   Postmenopause atrophic vaginitis 03/02/2013   RUQ PAIN 04/01/2007   Essential hypertension 03/24/2007   Diverticulosis of colon 03/17/2007   Past Medical History:  Diagnosis Date   Allergy    Diverticulitis    History of hiatal hernia    Hypertension    LBBB (left bundle branch block)    Migraine    history   Nonobstructive atherosclerosis of coronary artery    Osteoarthritis, hip, bilateral     Family History  Problem Relation Age of Onset   Hypertension Father    Diabetes Father        adult onset   Colon cancer Father    Hypertension Mother    Breast cancer Mother        breast   Cancer Mother        breast   Asthma Maternal Grandfather    Hypertension Brother    Diabetes Brother    Stomach cancer Neg Hx    Pancreatic cancer Neg Hx     Past Surgical History:  Procedure Laterality  Date   CESAREAN SECTION     x2   CHOLECYSTECTOMY     PARTIAL HYSTERECTOMY     Abdominal   ROTATOR CUFF REPAIR Left    ROTATOR CUFF REPAIR Right    TONSILLECTOMY     removed as a child   TOTAL HIP ARTHROPLASTY Right 05/13/2023   Procedure: RIGHT TOTAL HIP REPLACEMENT;  Surgeon: Tarry Kos, MD;  Location: [facility] OR;  Service: Orthopedics;  Laterality: Right;  3-C   TYMPANOSTOMY TUBE PLACEMENT Right 2022   Social History   Occupational History   Occupation: ophthalmology @ [employer]    Employer: [employer]  Tobacco Use   Smoking status: Never   Smokeless tobacco: Never  Vaping Use   Vaping status: Never Used  Substance and Sexual Activity   Alcohol use: No   Drug use: No   Sexual activity: Never

## 2023-05-29 LAB — CULTURE, BLOOD (ROUTINE X 2): Culture  Setup Time: NO GROWTH

## 2023-05-30 ENCOUNTER — Telehealth: Payer: Self-pay | Admitting: Orthopaedic Surgery

## 2023-05-30 NOTE — Telephone Encounter (Signed)
Patient called needing Rx refilled Oxycodone acetaminophen. The number to contact patient is [phone]

## 2023-05-31 ENCOUNTER — Other Ambulatory Visit: Payer: Self-pay | Admitting: Physician Assistant

## 2023-05-31 MED ORDER — OXYCODONE-ACETAMINOPHEN 5-325 MG PO TABS: 1.0000 | ORAL_TABLET | Freq: Four times a day (QID) | ORAL | 0 refills | Status: DC | PRN

## 2023-05-31 NOTE — Telephone Encounter (Signed)
sent 

## 2023-06-06 ENCOUNTER — Other Ambulatory Visit: Payer: Self-pay | Admitting: Physician Assistant

## 2023-06-06 ENCOUNTER — Telehealth: Payer: Self-pay | Admitting: Physician Assistant

## 2023-06-06 ENCOUNTER — Telehealth: Payer: Self-pay

## 2023-06-06 MED ORDER — HYDROCODONE-ACETAMINOPHEN 5-325 MG PO TABS: 1.0000 | ORAL_TABLET | Freq: Three times a day (TID) | ORAL | 0 refills | Status: DC | PRN

## 2023-06-06 NOTE — Telephone Encounter (Signed)
Sent to local pharmacy on file

## 2023-06-06 NOTE — Telephone Encounter (Signed)
If she is having significant pain weight bearing she can come in in the am.  Otherwise, I would back off activity and ice

## 2023-06-06 NOTE — Telephone Encounter (Signed)
Spoke with patient. She pulled her leg using the loop to help lift her leg. She feels like she may have pulled something in her groin. No difficulty with weight bearing. She is going to call tomorrow if she wants to be seen, otherwise she will rest and ice.

## 2023-06-06 NOTE — Telephone Encounter (Signed)
Is she sure it is not something else as this is the third rx she has had of this?  Was she not itching from previous prescriptions of oxycodone?  I can send in norco if she would like

## 2023-06-06 NOTE — Telephone Encounter (Signed)
Loraine Leriche, PT called stating that patient hurt her right hip on Tuesday night while getting in the bed and now she has increase pain and swelling.  Patient had right hip surgery on 05/13/2023.  CB# [phone].  Please advise.  Thank you.

## 2023-06-06 NOTE — Telephone Encounter (Signed)
Pt called stating the oxycodone is making her itch and asking she she need a new medication. Please call pt about this at [phone]

## 2023-06-06 NOTE — Telephone Encounter (Signed)
Spoke with patient. The itching started 3 days ago on her palms and bottom of her feet. She is aware that the hydrocodone has been sent to her pharmacy.

## 2023-06-10 ENCOUNTER — Ambulatory Visit: Admit: 2023-06-10 | Payer: Medicare Other | Admitting: Orthopaedic Surgery

## 2023-06-10 DIAGNOSIS — M1611 Unilateral primary osteoarthritis, right hip: Secondary | ICD-10-CM

## 2023-06-10 SURGERY — ARTHROPLASTY, HIP, TOTAL, ANTERIOR APPROACH
Anesthesia: Spinal | Site: Hip | Laterality: Right

## 2023-06-18 ENCOUNTER — Other Ambulatory Visit (INDEPENDENT_AMBULATORY_CARE_PROVIDER_SITE_OTHER): Payer: Medicare Other

## 2023-06-18 ENCOUNTER — Ambulatory Visit (INDEPENDENT_AMBULATORY_CARE_PROVIDER_SITE_OTHER): Payer: Medicare Other | Admitting: Physician Assistant

## 2023-06-18 DIAGNOSIS — Z96641 Presence of right artificial hip joint: Secondary | ICD-10-CM | POA: Diagnosis not present

## 2023-06-18 NOTE — Progress Notes (Signed)
Post-Op Visit Note   Patient: Cheryl Barnes           Date of Birth: 15-Dec-1950           MRN: [id number] Visit Date: 06/18/2023 PCP: [place], [name], MD   Assessment & Plan:  Chief Complaint:  Chief Complaint  Patient presents with   Right Hip - Follow-up    05/13/23 RT TOTAL HIP   Visit Diagnoses:  1. Status post total replacement of right hip     Plan: Patient is a pleasant 72 year old female who comes in today approximately 5 weeks status post right total hip replacement 05/13/2023.  She has been doing well.  She notes a little discomfort and stiffness but nothing more.  She occasionally takes a Norco at night.  She has been in therapy making good progress.  She has been compliant taking baby aspirin twice daily for DVT prophylaxis.  Examination of her right hip reveals a well-healed surgical scar without complication.  She does not have any pain with hip flexion although this is still slightly limited.  She is neurovascularly intact distally.  At this point, she will continue to advance with activity as tolerated.  Dental prophylaxis reinforced.  Follow-up in 7 weeks for repeat evaluation.  Call with concerns or questions.  Follow-Up Instructions: Return in about 7 weeks (around 08/06/2023).   Orders:  Orders Placed This Encounter  Procedures   XR HIP UNILAT W OR W/O PELVIS 2-3 VIEWS RIGHT   No orders of the defined types were placed in this encounter.   Imaging: XR HIP UNILAT W OR W/O PELVIS 2-3 VIEWS RIGHT  Result Date: 06/18/2023 Will see prosthesis without complication   PMFS History: Patient Active Problem List   Diagnosis Date Noted   Status post total replacement of right hip 05/13/2023   Primary osteoarthritis of right hip 05/12/2023   Primary osteoarthritis of left hip 02/06/2023   Statin myopathy 10/24/2022   Atherosclerosis of aorta (HCC) 10/17/2020   Nocturnal leg cramps 10/01/2018   Splenic artery aneurysm (HCC) 03/01/2017   Thyroid nodule  01/27/2017   Acute diverticulitis 01/24/2017   Recurrent herpes labialis 09/21/2016   Pure hypercholesterolemia 09/05/2016   Class 1 obesity with body mass index (BMI) of 31.0 to 31.9 in adult 09/05/2016   Bilateral hip pain 09/14/2015   Medicare annual wellness visit, subsequent 09/14/2015   Benign hypertension 07/17/2015   Sigmoid diverticulitis 01/27/2015   LBBB (left bundle branch block) 12/23/2014   Hematochezia 12/23/2014   Abdominal pain    Diverticulitis of large intestine without perforation or abscess without bleeding    Chest pain    Solitary pulmonary nodule 06/09/2014   Left lower lobe pneumonia 10/06/2013   Urinary incontinence in female 03/02/2013   Postmenopause atrophic vaginitis 03/02/2013   RUQ PAIN 04/01/2007   Essential hypertension 03/24/2007   Diverticulosis of colon 03/17/2007   Past Medical History:  Diagnosis Date   Allergy    Diverticulitis    History of hiatal hernia    Hypertension    LBBB (left bundle branch block)    Migraine    history   Nonobstructive atherosclerosis of coronary artery    Osteoarthritis, hip, bilateral     Family History  Problem Relation Age of Onset   Hypertension Father    Diabetes Father        adult onset   Colon cancer Father    Hypertension Mother    Breast cancer Mother  breast   Cancer Mother        breast   Asthma Maternal Grandfather    Hypertension Brother    Diabetes Brother    Stomach cancer Neg Hx    Pancreatic cancer Neg Hx     Past Surgical History:  Procedure Laterality Date   CESAREAN SECTION     x2   CHOLECYSTECTOMY     PARTIAL HYSTERECTOMY     Abdominal   ROTATOR CUFF REPAIR Left    ROTATOR CUFF REPAIR Right    TONSILLECTOMY     removed as a child   TOTAL HIP ARTHROPLASTY Right 05/13/2023   Procedure: RIGHT TOTAL HIP REPLACEMENT;  Surgeon: Tarry Kos, MD;  Location: [facility] OR;  Service: Orthopedics;  Laterality: Right;  3-C   TYMPANOSTOMY TUBE PLACEMENT Right 2022   Social  History   Occupational History   Occupation: ophthalmology @ [employer]    Employer: [employer]  Tobacco Use   Smoking status: Never   Smokeless tobacco: Never  Vaping Use   Vaping status: Never Used  Substance and Sexual Activity   Alcohol use: No   Drug use: No   Sexual activity: Never

## 2023-06-24 ENCOUNTER — Telehealth: Payer: Self-pay | Admitting: Orthopaedic Surgery

## 2023-06-24 ENCOUNTER — Other Ambulatory Visit: Payer: Self-pay | Admitting: Physician Assistant

## 2023-06-24 MED ORDER — HYDROCODONE-ACETAMINOPHEN 5-325 MG PO TABS: 1.0000 | ORAL_TABLET | Freq: Two times a day (BID) | ORAL | 0 refills | Status: DC | PRN

## 2023-06-24 MED ORDER — TIZANIDINE HCL 4 MG PO TABS: 4.0000 mg | ORAL_TABLET | Freq: Two times a day (BID) | ORAL | 2 refills | Status: DC | PRN

## 2023-06-24 NOTE — Telephone Encounter (Signed)
Weaning frequency of norco, but sent in both meds

## 2023-06-24 NOTE — Telephone Encounter (Signed)
Patient called needing a refill on Hydrocodone, and Tizanidine 4mg . CB#[phone]

## 2023-06-24 NOTE — Telephone Encounter (Signed)
Notified patient.

## 2023-06-25 ENCOUNTER — Encounter: Payer: Medicare Other | Admitting: Physician Assistant

## 2023-07-15 ENCOUNTER — Telehealth: Payer: Self-pay | Admitting: Orthopaedic Surgery

## 2023-07-15 ENCOUNTER — Other Ambulatory Visit: Payer: Self-pay | Admitting: Physician Assistant

## 2023-07-15 MED ORDER — HYDROCODONE-ACETAMINOPHEN 5-325 MG PO TABS: 1.0000 | ORAL_TABLET | Freq: Two times a day (BID) | ORAL | 0 refills | Status: DC | PRN

## 2023-07-15 NOTE — Telephone Encounter (Signed)
Pt requesting hydrocodone refill sent to [facility] on file

## 2023-07-15 NOTE — Telephone Encounter (Signed)
sent 

## 2023-07-30 ENCOUNTER — Ambulatory Visit (INDEPENDENT_AMBULATORY_CARE_PROVIDER_SITE_OTHER): Payer: Medicare Other | Admitting: Orthopaedic Surgery

## 2023-07-30 ENCOUNTER — Encounter: Payer: Self-pay | Admitting: Orthopaedic Surgery

## 2023-07-30 DIAGNOSIS — Z96641 Presence of right artificial hip joint: Secondary | ICD-10-CM

## 2023-07-30 MED ORDER — TIZANIDINE HCL 4 MG PO TABS: 4.0000 mg | ORAL_TABLET | Freq: Two times a day (BID) | ORAL | 2 refills | Status: DC | PRN

## 2023-07-30 NOTE — Progress Notes (Signed)
Post-Op Visit Note   Patient: Cheryl Barnes           Date of Birth: 26-Aug-1951           MRN: [id number] Visit Date: 07/30/2023 PCP: [place], [name], MD   Assessment & Plan:  Chief Complaint:  Chief Complaint  Patient presents with   Right Hip - Follow-up    Right total hip arthroplasty 05/13/2023   Visit Diagnoses:  1. Status post total replacement of right hip     Plan: [name] is about 3 months status post right total hip replacement.  She is doing well and has no real complaints.  She has resumed her daily activities.  Exam of the right hip shows fluid painless range of motion.  Normal gait pattern.  [name] is doing very well from her surgery.  She can follow-up with 3 months with repeat x-rays.  Follow-Up Instructions: Return in about 3 months (around 10/28/2023) for with lindsey.   Orders:  No orders of the defined types were placed in this encounter.  Meds ordered this encounter  Medications   tiZANidine (ZANAFLEX) 4 MG tablet    Sig: Take 1 tablet (4 mg total) by mouth 2 (two) times daily as needed for muscle spasms.    Dispense:  30 tablet    Refill:  2    Imaging: No results found.  PMFS History: Patient Active Problem List   Diagnosis Date Noted   Status post total replacement of right hip 05/13/2023   Primary osteoarthritis of right hip 05/12/2023   Primary osteoarthritis of left hip 02/06/2023   Statin myopathy 10/24/2022   Atherosclerosis of aorta (HCC) 10/17/2020   Nocturnal leg cramps 10/01/2018   Splenic artery aneurysm (HCC) 03/01/2017   Thyroid nodule 01/27/2017   Acute diverticulitis 01/24/2017   Recurrent herpes labialis 09/21/2016   Pure hypercholesterolemia 09/05/2016   Class 1 obesity with body mass index (BMI) of 31.0 to 31.9 in adult 09/05/2016   Bilateral hip pain 09/14/2015   Medicare annual wellness visit, subsequent 09/14/2015   Benign hypertension 07/17/2015   Sigmoid diverticulitis 01/27/2015   LBBB (left bundle  branch block) 12/23/2014   Hematochezia 12/23/2014   Abdominal pain    Diverticulitis of large intestine without perforation or abscess without bleeding    Chest pain    Solitary pulmonary nodule 06/09/2014   Left lower lobe pneumonia 10/06/2013   Urinary incontinence in female 03/02/2013   Postmenopause atrophic vaginitis 03/02/2013   RUQ PAIN 04/01/2007   Essential hypertension 03/24/2007   Diverticulosis of colon 03/17/2007   Past Medical History:  Diagnosis Date   Allergy    Diverticulitis    History of hiatal hernia    Hypertension    LBBB (left bundle branch block)    Migraine    history   Nonobstructive atherosclerosis of coronary artery    Osteoarthritis, hip, bilateral     Family History  Problem Relation Age of Onset   Hypertension Father    Diabetes Father        adult onset   Colon cancer Father    Hypertension Mother    Breast cancer Mother        breast   Cancer Mother        breast   Asthma Maternal Grandfather    Hypertension Brother    Diabetes Brother    Stomach cancer Neg Hx    Pancreatic cancer Neg Hx     Past Surgical History:  Procedure Laterality  Date   CESAREAN SECTION     x2   CHOLECYSTECTOMY     PARTIAL HYSTERECTOMY     Abdominal   ROTATOR CUFF REPAIR Left    ROTATOR CUFF REPAIR Right    TONSILLECTOMY     removed as a child   TOTAL HIP ARTHROPLASTY Right 05/13/2023   Procedure: RIGHT TOTAL HIP REPLACEMENT;  Surgeon: Tarry Kos, MD;  Location: [facility] OR;  Service: Orthopedics;  Laterality: Right;  3-C   TYMPANOSTOMY TUBE PLACEMENT Right 2022   Social History   Occupational History   Occupation: ophthalmology @ [employer]    Employer: [employer]  Tobacco Use   Smoking status: Never   Smokeless tobacco: Never  Vaping Use   Vaping status: Never Used  Substance and Sexual Activity   Alcohol use: No   Drug use: No   Sexual activity: Never

## 2023-09-02 ENCOUNTER — Telehealth: Payer: Self-pay | Admitting: Orthopaedic Surgery

## 2023-09-02 NOTE — Telephone Encounter (Signed)
 Yes that's fine

## 2023-09-02 NOTE — Telephone Encounter (Signed)
 Pt called in stating she is 3 Month Post op THA  and she would like to know if it is okay for her to [facility], also she will need a note stating she cannot walk long distances through the airport and she has mental implanted in her.

## 2023-09-03 NOTE — Telephone Encounter (Signed)
 Notified patient and mailed letter as requested.

## 2023-09-05 ENCOUNTER — Telehealth: Payer: Self-pay | Admitting: Orthopaedic Surgery

## 2023-09-05 ENCOUNTER — Other Ambulatory Visit: Payer: Self-pay | Admitting: Family Medicine

## 2023-09-05 DIAGNOSIS — Z780 Asymptomatic menopausal state: Secondary | ICD-10-CM

## 2023-09-05 NOTE — Telephone Encounter (Signed)
 Patient called and said she is having a teeth cleaning and needs antibiotics. CB#[phone]

## 2023-09-06 ENCOUNTER — Other Ambulatory Visit: Payer: Self-pay | Admitting: Physician Assistant

## 2023-09-06 MED ORDER — DOXYCYCLINE HYCLATE 100 MG PO CAPS: ORAL_CAPSULE | ORAL | 1 refills | Status: DC

## 2023-09-06 NOTE — Telephone Encounter (Signed)
 [facility] to [facility]

## 2023-09-06 NOTE — Telephone Encounter (Signed)
 Notified patient.

## 2023-09-19 ENCOUNTER — Ambulatory Visit: Payer: Medicare Other

## 2023-09-19 ENCOUNTER — Other Ambulatory Visit: Payer: Medicare Other

## 2023-09-24 ENCOUNTER — Ambulatory Visit
Admission: RE | Admit: 2023-09-24 | Discharge: 2023-09-24 | Disposition: A | Discharge status: Home / Self Care | Payer: Medicare Other | Source: Ambulatory Visit | Attending: Family Medicine | Admitting: Family Medicine

## 2023-09-24 DIAGNOSIS — Z1231 Encounter for screening mammogram for malignant neoplasm of breast: Secondary | ICD-10-CM

## 2023-10-29 ENCOUNTER — Ambulatory Visit (INDEPENDENT_AMBULATORY_CARE_PROVIDER_SITE_OTHER): Payer: Medicare Other | Admitting: Physician Assistant

## 2023-10-29 ENCOUNTER — Other Ambulatory Visit (INDEPENDENT_AMBULATORY_CARE_PROVIDER_SITE_OTHER): Payer: Self-pay

## 2023-10-29 DIAGNOSIS — Z96641 Presence of right artificial hip joint: Secondary | ICD-10-CM | POA: Diagnosis not present

## 2023-10-29 DIAGNOSIS — M1612 Unilateral primary osteoarthritis, left hip: Secondary | ICD-10-CM

## 2023-10-29 MED ORDER — GABAPENTIN 100 MG PO CAPS: 100.0000 mg | ORAL_CAPSULE | Freq: Two times a day (BID) | ORAL | 1 refills | Status: DC

## 2023-10-29 NOTE — Progress Notes (Signed)
 Post-Op Visit Note   Patient: Cheryl Barnes           Date of Birth: [birth year]           MRN: [id number] Visit Date: 10/29/2023 PCP: [place], [name], MD   Assessment & Plan:  Chief Complaint:  Chief Complaint  Patient presents with   Right Hip - Routine Post Op, Pain   Visit Diagnoses:  1. Status post total replacement of right hip   2. Primary osteoarthritis of left hip     Plan: Patient is a pleasant 73 year old female who comes in today 6 months status post right total hip replacement.  She is still having pain to the groin when she is dressing as well as when she is flexing her hip to get in and out of the car.  She has been taking Tylenol and tizanidine for this.  She is also complaining of burning nerve type pain to the anterolateral thigh.  Overall, things have slightly improved.  She is also complaining of left hip pain.  All this pain is to the groin.  Worse going from a seated to standing position.  No previous cortisone injection to the left hip.  Examination of the right hip reveals pain with hip flexion.  She does not have any pain with logroll.  Left hip exam: Pain with logroll and FADIR testing.  She is neurovascularly intact distally.  At this point, she will continue to work on home exercise program.  Have sent in gabapentin to hopefully help with the nerve pain.  Dental prophylaxis reinforced.  She will follow-up with Dr. Roda Shutters in 3 months for repeat evaluation.  In regards to the left hip, we have discussed intra-articular cortisone injection.  She would like to hold off for now.  She will call us back if she wishes to proceed.  Call with concerns or questions.  Follow-Up Instructions: Return in about 3 months (around 01/29/2024) for with Dr. Roda Shutters.   Orders:  Orders Placed This Encounter  Procedures   XR HIP UNILAT W OR W/O PELVIS 2-3 VIEWS RIGHT   Meds ordered this encounter  Medications   gabapentin (NEURONTIN) 100 MG capsule    Sig: Take 1 capsule (100 mg  total) by mouth 2 (two) times daily.    Dispense:  60 capsule    Refill:  1    Imaging: XR HIP UNILAT W OR W/O PELVIS 2-3 VIEWS RIGHT Result Date: 10/29/2023 Well-seated prosthesis without complication   PMFS History: Patient Active Problem List   Diagnosis Date Noted   Status post total replacement of right hip 05/13/2023   Primary osteoarthritis of right hip 05/12/2023   Primary osteoarthritis of left hip 02/06/2023   Statin myopathy 10/24/2022   Atherosclerosis of aorta (HCC) 10/17/2020   Nocturnal leg cramps 10/01/2018   Splenic artery aneurysm (HCC) 03/01/2017   Thyroid nodule 01/27/2017   Acute diverticulitis 01/24/2017   Recurrent herpes labialis 09/21/2016   Pure hypercholesterolemia 09/05/2016   Class 1 obesity with body mass index (BMI) of 31.0 to 31.9 in adult 09/05/2016   Bilateral hip pain 09/14/2015   Medicare annual wellness visit, subsequent 09/14/2015   Benign hypertension 07/17/2015   Sigmoid diverticulitis 01/27/2015   LBBB (left bundle branch block) 12/23/2014   Hematochezia 12/23/2014   Abdominal pain    Diverticulitis of large intestine without perforation or abscess without bleeding    Chest pain    Solitary pulmonary nodule 06/09/2014   Left lower lobe pneumonia 10/06/2013  Urinary incontinence in female 03/02/2013   Postmenopause atrophic vaginitis 03/02/2013   RUQ PAIN 04/01/2007   Essential hypertension 03/24/2007   Diverticulosis of colon 03/17/2007   Past Medical History:  Diagnosis Date   Allergy    Diverticulitis    History of hiatal hernia    Hypertension    LBBB (left bundle branch block)    Migraine    history   Nonobstructive atherosclerosis of coronary artery    Osteoarthritis, hip, bilateral     Family History  Problem Relation Age of Onset   Hypertension Father    Diabetes Father        adult onset   Colon cancer Father    Hypertension Mother    Breast cancer Mother        breast   Cancer Mother        breast    Asthma Maternal Grandfather    Hypertension Brother    Diabetes Brother    Stomach cancer Neg Hx    Pancreatic cancer Neg Hx     Past Surgical History:  Procedure Laterality Date   CESAREAN SECTION     x2   CHOLECYSTECTOMY     PARTIAL HYSTERECTOMY     Abdominal   ROTATOR CUFF REPAIR Left    ROTATOR CUFF REPAIR Right    TONSILLECTOMY     removed as a child   TOTAL HIP ARTHROPLASTY Right 05/13/2023   Procedure: RIGHT TOTAL HIP REPLACEMENT;  Surgeon: Tarry Kos, MD;  Location: [facility] OR;  Service: Orthopedics;  Laterality: Right;  3-C   TYMPANOSTOMY TUBE PLACEMENT Right 2022   Social History   Occupational History   Occupation: ophthalmology @ [employer]    Employer: [employer]  Tobacco Use   Smoking status: Never   Smokeless tobacco: Never  Vaping Use   Vaping status: Never Used  Substance and Sexual Activity   Alcohol use: No   Drug use: No   Sexual activity: Never

## 2023-10-31 ENCOUNTER — Encounter: Payer: Medicare Other | Admitting: Family Medicine

## 2023-10-31 NOTE — Progress Notes (Signed)
 error

## 2023-11-02 ENCOUNTER — Other Ambulatory Visit: Payer: Self-pay | Admitting: Orthopaedic Surgery

## 2023-11-04 ENCOUNTER — Encounter: Payer: Self-pay | Admitting: Family Medicine

## 2023-11-04 ENCOUNTER — Ambulatory Visit (INDEPENDENT_AMBULATORY_CARE_PROVIDER_SITE_OTHER): Payer: Medicare Other | Admitting: Family Medicine

## 2023-11-04 VITALS — BP 126/80 | HR 76 | Temp 98.4°F | Resp 16 | Ht 61.0 in | Wt 165.5 lb

## 2023-11-04 DIAGNOSIS — T466X5A Adverse effect of antihyperlipidemic and antiarteriosclerotic drugs, initial encounter: Secondary | ICD-10-CM

## 2023-11-04 DIAGNOSIS — H9313 Tinnitus, bilateral: Secondary | ICD-10-CM | POA: Diagnosis not present

## 2023-11-04 DIAGNOSIS — E785 Hyperlipidemia, unspecified: Secondary | ICD-10-CM | POA: Diagnosis not present

## 2023-11-04 DIAGNOSIS — Z6831 Body mass index (BMI) 31.0-31.9, adult: Secondary | ICD-10-CM

## 2023-11-04 DIAGNOSIS — E66811 Obesity, class 1: Secondary | ICD-10-CM

## 2023-11-04 DIAGNOSIS — R739 Hyperglycemia, unspecified: Secondary | ICD-10-CM

## 2023-11-04 DIAGNOSIS — G720 Drug-induced myopathy: Secondary | ICD-10-CM

## 2023-11-04 DIAGNOSIS — Z1211 Encounter for screening for malignant neoplasm of colon: Secondary | ICD-10-CM

## 2023-11-04 DIAGNOSIS — Z0000 Encounter for general adult medical examination without abnormal findings: Secondary | ICD-10-CM

## 2023-11-04 DIAGNOSIS — I10 Essential (primary) hypertension: Secondary | ICD-10-CM | POA: Diagnosis not present

## 2023-11-04 LAB — HEMOGLOBIN A1C: Hgb A1c MFr Bld: 5.3 % (ref 4.6–6.5)

## 2023-11-04 LAB — COMPREHENSIVE METABOLIC PANEL
ALT: 19 U/L (ref 0–35)
AST: 20 U/L (ref 0–37)
Albumin: 4.8 g/dL (ref 3.5–5.2)
Alkaline Phosphatase: 116 U/L (ref 39–117)
BUN: 17 mg/dL (ref 6–23)
CO2: 26 meq/L (ref 19–32)
Calcium: 9.7 mg/dL (ref 8.4–10.5)
Chloride: 108 meq/L (ref 96–112)
Creatinine, Ser: 0.71 mg/dL (ref 0.40–1.20)
GFR: 85.23 mL/min (ref >60.00)
Glucose, Bld: 102 mg/dL — ABNORMAL HIGH (ref 70–99)
Potassium: 3.8 meq/L (ref 3.5–5.1)
Sodium: 143 meq/L (ref 135–145)
Total Bilirubin: 0.6 mg/dL (ref 0.2–1.2)
Total Protein: 7.7 g/dL (ref 6.0–8.3)

## 2023-11-04 LAB — LIPID PANEL
Cholesterol: 203 mg/dL — ABNORMAL HIGH (ref 0–200)
HDL: 57 mg/dL (ref >39.00)
LDL Cholesterol: 117 mg/dL — ABNORMAL HIGH (ref 0–99)
NonHDL: 146.43
Total CHOL/HDL Ratio: 4
Triglycerides: 147 mg/dL (ref 0.0–149.0)
VLDL: 29.4 mg/dL (ref 0.0–40.0)

## 2023-11-04 NOTE — Assessment & Plan Note (Signed)
 She understands the benefits of wt loss as well as adverse effects of obesity. Consistency with healthy diet and physical activity encouraged. She agrees with referral to healthy weight and wellness clinic.

## 2023-11-04 NOTE — Assessment & Plan Note (Addendum)
 We discussed Dx and prognosis. Explained we do not have effects treatment options. Follows with ENT prn.

## 2023-11-04 NOTE — Assessment & Plan Note (Signed)
 She has not tolerated statins in the past, she tried pravastatin, Atorvastatin,and Lovastatin among some. Continue low fat diet. Further recommendation will be given according to lipid panel result.

## 2023-11-04 NOTE — Progress Notes (Signed)
 HPI: Cheryl Barnes is a 73 y.o. female with a PMHx significant for HTN, HLD, LBBB, diverticulosis, aortic atherosclerosis, and thyroid nodule, who is here today for chronic follow up and her annual medicare wellness visit.  Last seen on 10/24/2022.   Exercise: She has not been exercising regularly since her hip surgery.  Diet: She says she is cooking at home and eating healthy in general.  Vision: UTD on routine vision care.  Dental: UTD on routine dental care.   Chronic medical problems:   Hypertension:  Medications: Currently on amlodipine 5 mg daily and losartan 50 mg daily. No longer taking aspirin 81 mg.  BP readings at home: Not checking her BP at home very frequently. The last time she checked it at home (~3 weeks ago) it was 115/70.  Side effects: none Negative for unusual or severe headache, visual changes, exertional chest pain, dyspnea,  focal weakness, or edema.  She would like to stop medications.  Lab Results  Component Value Date   CREATININE 0.80 05/22/2023   BUN 22 05/22/2023   NA 140 05/22/2023   K 3.7 05/22/2023   CL 107 05/22/2023   CO2 22 05/22/2023   Hyperlipidemia: Not currently on pharmacologic treatment.  Lab Results  Component Value Date   CHOL 188 10/23/2021   HDL 51.40 10/23/2021   LDLCALC 101 (H) 10/23/2021   LDLDIRECT 120.6 08/22/2011   TRIG 178.0 (H) 10/23/2021   CHOLHDL 4 10/23/2021   Chronic tinnitus:  Inquiring about treatment options. She has followed with ENT for tinnitus but it is still bothering her. She was recommended to try hearing aids, but they did not help.  Currently on Clear Tinnitus 1 capsule daily.  She sleeps with a noise machine.   Would like to have right ear check and see if she still has the tube in place. Follows with ENT as needed.  -She had a right hip replacement on 05/13/2023. She has been following with Dr. Roda Shutters and Jari Sportsman PA-C.  She did complete physical therapy. Currently on Tizanidine 4  mg twice daily as needed and gabapentin 100 mg twice daily. States that she was prescribed the gabapentin at her last visit for stinging and burning in the hip.  She will see them again in 3 months.   Seasonal allergies:  Currently on Zyrtec 10 mg daily.   Noted elevated glucose at 145 in 04/2023. No hx of diabetes.  Concerns today:  Patient would like to get on a medication for weight loss.  She says she has tried managing her weight with diet and exercise but it has not helped.  She is receptive to seeing the [facility].   Medicare Wellness:  Independent ADL's and IADL's. She has a power of attorney and living will.   Health Maintenance  Topic Date Due   Colon Cancer Screening  10/30/2023   COVID-19 Vaccine (1) 11/20/2023*   Zoster (Shingles) Vaccine (1 of 2) 09/04/2024*   Medicare Annual Wellness Visit  11/03/2024   Mammogram  09/23/2025   Pneumonia Vaccine  Completed   Flu Shot  Completed   DEXA scan (bone density measurement)  Completed   Hepatitis C Screening  Completed   HPV Vaccine  Aged Out   DTaP/Tdap/Td vaccine  Discontinued  *Topic was postponed. The date shown is not the original due date.   DEXA scheduled for 03/2024. Not taking calcium or vitamin D supplementation.  Immunization History  Administered Date(s) Administered   Fluad Quad(high  Dose 65+) 05/08/2019   Influenza Whole 05/27/2009   Influenza, High Dose Seasonal PF 05/08/2017, 08/05/2023   Influenza,inj,Quad PF,6+ Mos 05/13/2014   Influenza-Unspecified 05/13/2013, 05/12/2014, 05/12/2015, 04/27/2018   Pneumococcal Conjugate-13 12/18/2016   Pneumococcal Polysaccharide-23 12/24/2017   Td 08/27/1997, 03/09/2008   Zoster, Live 10/06/2013   Functional Status Survey:    11/04/2023    9:59 AM 10/24/2022    8:00 AM 10/23/2021    9:54 AM 10/17/2020    9:01 AM 10/09/2019   10:17 AM  Fall Risk   Falls in the past year? 0 0 0 0 0  Number falls in past yr: 0 0 0 0 0  Injury with Fall?  0 0 0 0 0  Risk for fall due to : Other (Comment) Other (Comment) No Fall Risks    Follow up Falls evaluation completed Falls evaluation completed Falls evaluation completed;Education provided Education provided Education provided   Providers she sees regularly:  Dermatology: Dr. Elonda Husky Laural Benes) [facility] provider: Dr. Laural Benes Ortho: Dr. Gershon Mussel and Jari Sportsman PA-C No longer following with cardiology or ENT.      11/04/2023    9:59 AM  Depression screen PHQ 2/9  Decreased Interest 0  Down, Depressed, Hopeless 0  PHQ - 2 Score 0    Mini-Cog - 11/04/23 1015     Normal clock drawing test? yes    How many words correct? 3            Vision Screening   Right eye Left eye Both eyes  Without correction 20/30 20/30 20/30   With correction       Review of Systems  Constitutional:  Positive for fatigue. Negative for activity change, appetite change and fever.  HENT:  Negative for mouth sores, nosebleeds, sore throat and trouble swallowing.   Respiratory:  Negative for cough and wheezing.   Gastrointestinal:  Negative for abdominal pain, nausea and vomiting.       Negative for changes in bowel habits.  Endocrine: Negative for cold intolerance and heat intolerance.  Genitourinary:  Negative for decreased urine volume, dysuria and hematuria.  Allergic/Immunologic: Positive for environmental allergies.  Neurological:  Negative for syncope and facial asymmetry.  Psychiatric/Behavioral:  Negative for confusion and hallucinations.   See other pertinent positives and negatives in HPI.  Current Outpatient Medications on File Prior to Visit  Medication Sig Dispense Refill   amLODipine (NORVASC) 5 MG tablet TAKE 1 TABLET DAILY 90 tablet 3   cetirizine (ZYRTEC) 10 MG tablet Take 10 mg by mouth every evening.     docusate sodium (COLACE) 100 MG capsule Take 1 capsule (100 mg total) by mouth daily as needed. 30 capsule 2   gabapentin (NEURONTIN) 100 MG capsule Take 1  capsule (100 mg total) by mouth 2 (two) times daily. 60 capsule 1   Homeopathic Products (CLEAR TINNITUS) CAPS Take 1 capsule by mouth daily after supper. Silencil Max for Tinnitus     losartan (COZAAR) 50 MG tablet TAKE 1 TABLET DAILY (Patient taking differently: Take 50 mg by mouth every evening.) 90 tablet 3   melatonin 5 MG TABS Take 5 mg by mouth at bedtime as needed (sleep).     valACYclovir (VALTREX) 1000 MG tablet Take 2 tablets by mouth as needed upon acute onset and asap repeat dose in 12 hours 16 tablet 2   tiZANidine (ZANAFLEX) 4 MG tablet TAKE 1 TABLET(4 MG) BY MOUTH TWICE DAILY AS NEEDED FOR MUSCLE SPASMS 20 tablet 0   No current  facility-administered medications on file prior to visit.   Past Medical History:  Diagnosis Date   Allergy    Diverticulitis    History of hiatal hernia    Hypertension    LBBB (left bundle branch block)    Migraine    history   Nonobstructive atherosclerosis of coronary artery    Osteoarthritis, hip, bilateral    Allergies  Allergen Reactions   Penicillins Itching and Rash    Has patient had a PCN reaction causing immediate rash, facial/tongue/throat swelling, SOB or lightheadedness with hypotension: Yes Has patient had a PCN reaction causing severe rash involving mucus membranes or skin necrosis: No Has patient had a PCN reaction that required hospitalization No Has patient had a PCN reaction occurring within the last 10 years: No If all of the above answers are "NO", then may proceed with Cephalosporin use.    Seasonal Ic [Cholestatin] Other (See Comments)    Upper resp, sore throat, sneezing, etc.   Tape Other (See Comments)    Tape causes some redness & causes BLISTERS if left on for too long   Latex Itching and Rash   Social History   Socioeconomic History   Marital status: Single    Spouse name: Not on file   Number of children: 2   Years of education: Not on file   Highest education level: Not on file  Occupational History    Occupation: ophthalmology @ [employer]    Employer: [employer]  Tobacco Use   Smoking status: Never   Smokeless tobacco: Never  Vaping Use   Vaping status: Never Used  Substance and Sexual Activity   Alcohol use: No   Drug use: No   Sexual activity: Never  Other Topics Concern   Not on file  Social History Narrative   Not on file   Social Drivers of Health   Financial Resource Strain: Not on file  Food Insecurity: Not on file  Transportation Needs: Not on file  Physical Activity: Not on file  Stress: Not on file  Social Connections: Not on file   Today's Vitals   11/04/23 0950  BP: 126/80  Pulse: 76  Resp: 16  Temp: 98.4 F (36.9 C)  TempSrc: Oral  SpO2: 96%  Weight: 165 lb 8 oz (75.1 kg)  Height: 5\' 1"  (1.549 m)   Body mass index is 31.27 kg/m.  Physical Exam Vitals and nursing note reviewed.  Constitutional:      General: She is not in acute distress.    Appearance: She is well-developed.  HENT:     Head: Normocephalic and atraumatic.     Right Ear: External ear normal. Tympanic membrane is not erythematous.     Left Ear: Tympanic membrane, ear canal and external ear normal.     Ears:     Comments: Cerumen excess in right ear canal. Tube surrounded by cerumen.Not sure if tube is attached to TM.    Mouth/Throat:     Mouth: Mucous membranes are moist.     Pharynx: Oropharynx is clear. Uvula midline.  Eyes:     Conjunctiva/sclera: Conjunctivae normal.     Pupils: Pupils are equal, round, and reactive to light.  Neck:     Vascular: No carotid bruit.  Cardiovascular:     Rate and Rhythm: Normal rate and regular rhythm.     Pulses:          Dorsalis pedis pulses are 2+ on the right side and 2+ on the left side.  Heart sounds: No murmur heard. Pulmonary:     Effort: Pulmonary effort is normal. No respiratory distress.     Breath sounds: Normal breath sounds.  Abdominal:     Palpations: Abdomen is soft. There is no hepatomegaly or mass.      Tenderness: There is no abdominal tenderness.  Musculoskeletal:     Right lower leg: No edema.     Left lower leg: No edema.  Skin:    General: Skin is warm.     Findings: No erythema or rash.  Neurological:     General: No focal deficit present.     Mental Status: She is alert and oriented to person, place, and time.     Cranial Nerves: No cranial nerve deficit.     Comments: Mildly antalgic gait, not assisted.  Psychiatric:        Mood and Affect: Mood and affect normal.   ASSESSMENT AND PLAN:  Cheryl Barnes was here today for chronic follow up and her annual medicare wellness visit.   Orders Placed This Encounter  Procedures   Comprehensive metabolic panel   Lipid panel   Hemoglobin A1c   Amb Ref to Medical Weight Management   Ambulatory referral to Gastroenterology   Lab Results  Component Value Date   HGBA1C 5.3 11/04/2023   Lab Results  Component Value Date   NA 143 11/04/2023   CL 108 11/04/2023   K 3.8 11/04/2023   CO2 26 11/04/2023   BUN 17 11/04/2023   CREATININE 0.71 11/04/2023   GFR 85.23 11/04/2023   CALCIUM 9.7 11/04/2023   PHOS 2.4 12/19/2014   ALBUMIN 4.8 11/04/2023   GLUCOSE 102 (H) 11/04/2023   Lab Results  Component Value Date   ALT 19 11/04/2023   AST 20 11/04/2023   ALKPHOS 116 11/04/2023   BILITOT 0.6 11/04/2023   Lab Results  Component Value Date   CHOL 203 (H) 11/04/2023   HDL 57.00 11/04/2023   LDLCALC 117 (H) 11/04/2023   LDLDIRECT 120.6 08/22/2011   TRIG 147.0 11/04/2023   CHOLHDL 4 11/04/2023   The 10-year ASCVD risk score (Arnett DK, et al., 2019) is: 13.6%   Values used to calculate the score:     Age: 3 years     Sex: Female     Is Non-Hispanic African American: No     Diabetic: No     Tobacco smoker: No     Systolic Blood Pressure: 126 mmHg     Is BP treated: Yes     HDL Cholesterol: 57 mg/dL     Total Cholesterol: 203 mg/dL  Medicare annual wellness visit, subsequent Assessment & Plan: We discussed  the importance of staying active, physically and mentally, as well as the benefits of a healthy/balance diet. Low impact exercise that involve stretching and strengthing are ideal. Vaccines: Tdap and second Shingrix to be given at her pharmacy. We discussed preventive screening for the next 5-10 years, summery of recommendations given in AVS. This is a list of the screening recommended for you and due dates:  Fall prevention. Advance directives and end of life discussed, she has POA and living will.   Hyperglycemia -     Hemoglobin A1c; Future  Hyperlipidemia, unspecified hyperlipidemia type Assessment & Plan: She has not tolerated statins in the past, she tried pravastatin, Atorvastatin,and Lovastatin among some. Continue low fat diet. Further recommendation will be given according to lipid panel result.  Orders: -     Lipid panel; Future  Tinnitus of both ears Assessment & Plan: We discussed Dx and prognosis. Explained we do not have effects treatment options. Follows with ENT prn.   Essential hypertension Assessment & Plan: BP today adequately controlled. She would like to stop medication, explained that it may cause elevation of BP. She agrees with trying to decrease losartan from 50 mg to 25 mg daily. Continue amlodipine 5 mg daily. Monitor BP daily and instructed to let me know about readings in 3 weeks. Eye exam is current. As far as problem is stable, annual follow-up can be continued.  Orders: -     Comprehensive metabolic panel; Future  Class 1 obesity with serious comorbidity and body mass index (BMI) of 31.0 to 31.9 in adult, unspecified obesity type Assessment & Plan: She understands the benefits of wt loss as well as adverse effects of obesity. Consistency with healthy diet and physical activity encouraged. She agrees with referral to healthy weight and wellness clinic.  Orders: -     Amb Ref to Medical Weight Management  Colon cancer screening -      Ambulatory referral to Gastroenterology   I, Rolla Etienne Wierda, acting as a scribe for Chayanne Speir [place], MD., have documented all relevant documentation on the behalf of Aairah Negrette [place], MD, as directed by  Srihitha Tagliaferri [place], MD while in the presence of Geneal Huebert [place], MD.   I, Makylee Sanborn [place], MD, have reviewed all documentation for this visit. The documentation on 11/04/23 for the exam, diagnosis, procedures, and orders are all accurate and complete.  Jaiana Sheffer G. [place], MD  [facility]. [place] office.

## 2023-11-04 NOTE — Patient Instructions (Addendum)
  Cheryl Barnes , Thank you for taking time to come for your Medicare Wellness Visit. I appreciate your ongoing commitment to your health goals. Please review the following plan we discussed and let me know if I can assist you in the future.   These are the goals we discussed:  Goals      Exercise 3x per week (30 min per time)     If possible try tai chi and/or walking videos for 15-30 min daily.        This is a list of the screening recommended for you and due dates:  Health Maintenance  Topic Date Due   Medicare Annual Wellness Visit  10/25/2023   Colon Cancer Screening  10/30/2023   COVID-19 Vaccine (1) 11/20/2023*   Zoster (Shingles) Vaccine (1 of 2) 09/04/2024*   Mammogram  09/23/2025   Pneumonia Vaccine  Completed   Flu Shot  Completed   DEXA scan (bone density measurement)  Completed   Hepatitis C Screening  Completed   HPV Vaccine  Aged Out   DTaP/Tdap/Td vaccine  Discontinued  *Topic was postponed. The date shown is not the original due date.   A few things to remember from today's visit:  Hyperglycemia - Plan: Hemoglobin A1c  Pure hypercholesterolemia - Plan: Lipid panel  Essential hypertension - Plan: Comprehensive metabolic panel  Colon cancer screening - Plan: Ambulatory referral to Gastroenterology  Class 1 obesity with serious comorbidity and body mass index (BMI) of 31.0 to 31.9 in adult, unspecified obesity type - Plan: Amb Ref to Medical Weight Management  Decrease Losartan to 1/2 tab daily, no changes in Amlodipine. Chest blood pressure daily.  Blood pressure readings in 2-3 weeks.  If you need refills for medications you take chronically, please call your pharmacy. Do not use My Chart to request refills or for acute issues that need immediate attention. If you send a my chart message, it may take a few days to be addressed, specially if I am not in the office.  Please be sure medication list is accurate. If a new problem present, please set up  appointment sooner than planned today.

## 2023-11-04 NOTE — Assessment & Plan Note (Signed)
 BP today adequately controlled. She would like to stop medication, explained that it may cause elevation of BP. She agrees with trying to decrease losartan from 50 mg to 25 mg daily. Continue amlodipine 5 mg daily. Monitor BP daily and instructed to let me know about readings in 3 weeks. Eye exam is current. As far as problem is stable, annual follow-up can be continued.

## 2023-11-04 NOTE — Assessment & Plan Note (Signed)
 We discussed the importance of staying active, physically and mentally, as well as the benefits of a healthy/balance diet. Low impact exercise that involve stretching and strengthing are ideal. Vaccines: Tdap and second Shingrix to be given at her pharmacy. We discussed preventive screening for the next 5-10 years, summery of recommendations given in AVS. This is a list of the screening recommended for you and due dates:  Fall prevention. Advance directives and end of life discussed, she has POA and living will.

## 2023-11-06 ENCOUNTER — Encounter (INDEPENDENT_AMBULATORY_CARE_PROVIDER_SITE_OTHER): Payer: Self-pay

## 2023-11-08 ENCOUNTER — Encounter: Payer: Self-pay | Admitting: Pediatrics

## 2023-11-08 ENCOUNTER — Other Ambulatory Visit: Payer: Self-pay | Admitting: Family Medicine

## 2023-11-08 DIAGNOSIS — I10 Essential (primary) hypertension: Secondary | ICD-10-CM

## 2023-11-12 ENCOUNTER — Other Ambulatory Visit: Payer: Self-pay | Admitting: Physician Assistant

## 2023-11-21 ENCOUNTER — Other Ambulatory Visit: Payer: Self-pay | Admitting: Physician Assistant

## 2023-11-21 MED ORDER — TIZANIDINE HCL 4 MG PO TABS: 4.0000 mg | ORAL_TABLET | Freq: Every day | ORAL | 0 refills | Status: DC | PRN

## 2023-11-21 NOTE — Telephone Encounter (Signed)
 Will need to send to pain mgmt if she continues to need this.  Sent in one more with diff instructions

## 2023-12-04 ENCOUNTER — Other Ambulatory Visit: Payer: Self-pay | Admitting: Physician Assistant

## 2023-12-05 ENCOUNTER — Ambulatory Visit (AMBULATORY_SURGERY_CENTER)

## 2023-12-05 VITALS — Ht 61.0 in | Wt 165.0 lb

## 2023-12-05 DIAGNOSIS — Z800 Family history of malignant neoplasm of digestive organs: Secondary | ICD-10-CM

## 2023-12-05 DIAGNOSIS — Z1211 Encounter for screening for malignant neoplasm of colon: Secondary | ICD-10-CM

## 2023-12-05 MED ORDER — NA SULFATE-K SULFATE-MG SULF 17.5-3.13-1.6 GM/177ML PO SOLN
1.0000 | Freq: Once | ORAL | 0 refills | Status: AC
Start: 2023-12-05 — End: 2023-12-05

## 2023-12-05 NOTE — Progress Notes (Signed)

## 2023-12-08 ENCOUNTER — Other Ambulatory Visit: Payer: Self-pay | Admitting: Physician Assistant

## 2023-12-16 ENCOUNTER — Ambulatory Visit (INDEPENDENT_AMBULATORY_CARE_PROVIDER_SITE_OTHER): Admitting: Internal Medicine

## 2023-12-16 ENCOUNTER — Encounter (INDEPENDENT_AMBULATORY_CARE_PROVIDER_SITE_OTHER): Payer: Self-pay | Admitting: Internal Medicine

## 2023-12-16 VITALS — BP 130/84 | HR 72 | Temp 98.0°F | Ht 60.0 in | Wt 165.0 lb

## 2023-12-16 DIAGNOSIS — Z6832 Body mass index (BMI) 32.0-32.9, adult: Secondary | ICD-10-CM

## 2023-12-16 DIAGNOSIS — I10 Essential (primary) hypertension: Secondary | ICD-10-CM

## 2023-12-16 DIAGNOSIS — E66811 Obesity, class 1: Secondary | ICD-10-CM

## 2023-12-16 DIAGNOSIS — E7800 Pure hypercholesterolemia, unspecified: Secondary | ICD-10-CM

## 2023-12-16 DIAGNOSIS — Z789 Other specified health status: Secondary | ICD-10-CM | POA: Insufficient documentation

## 2023-12-16 DIAGNOSIS — Z0289 Encounter for other administrative examinations: Secondary | ICD-10-CM

## 2023-12-16 NOTE — Progress Notes (Signed)
 Office: [phone]  /  Fax: [phone]   Initial Visit  Cheryl Barnes was seen in clinic today to evaluate for obesity. She is interested in losing weight to improve overall health and reduce the risk of weight related complications. She presents today to review program treatment options, initial physical assessment, and evaluation.     Discussed the use of AI scribe software for clinical note transcription with the patient, who gave verbal consent to proceed.   History of Present Illness   Cheryl Barnes is a 73 year old female who presents for an introductory visit for medical weight management.  She has experienced weight gain over the past three to five years, which she attributes to a possible slowing metabolism. She is uncertain if factors like cortisol or insulin  resistance are contributing. Despite trying various weight loss methods, she has found it challenging to lose weight. Following a recent hip replacement surgery, she gained approximately ten pounds during her recovery, which she attributes to her sister's caregiving and cooking.  She has a history of elevated fasting blood sugar levels, with a recent measurement of 102 mg/dL, indicating prediabetes, although her A1c levels have remained normal. She has not been diagnosed with diabetes.  Her LDL cholesterol is mildly elevated at 117 mg/dL. She has a history of statin intolerance, experiencing muscle aches and joint pain with its use, and has not been on cholesterol-lowering medication for the past two to three years. A coronary artery calcium  score in 2022 showed a score of zero but mild stenosis in one artery.  She is currently on gabapentin  100 mg twice daily for nerve pain following her hip surgery. She also takes losartan  and amlodipine  for blood pressure management, which is well controlled at 130/84 mmHg.  Her family history is significant for both parents being overweight. She reports getting 8 to 10 hours  of restful sleep per night and tries to avoid fast food, although she occasionally consumes it. Her physical activity is limited to household chores and yard work due to her hip surgery.  She has not previously used medications for weight loss but is open to considering them. Her current nutritional approach involves watching portion sizes and focusing on a diet of protein and vegetables.         She was referred by: PCP  When asked what else they would like to accomplish? She states: Improve energy levels and physical activity, Improve existing medical conditions, Improve quality of life, and improve mobility  When asked how has your weight affected you? She states: Contributed to medical problems, Contributed to orthopedic problems or mobility issues, and Having fatigue  Weight history:   Some associated conditions: Hypertension, Hyperlipidemia, Prediabetes, and ASCVD  Contributing factors: Family history of obesity and Use of obesogenic medications: Antiepileptics  Weight promoting medications identified: Antiepileptics  Current nutrition plan: Portion control / smart choices  Current level of physical activity: Limited due to chronic pain or orthopedic problems and NEAT  Current or previous pharmacotherapy: None and Is interested in pharmacotherapy  Response to medication: Never tried medications   Past medical history includes:   Past Medical History:  Diagnosis Date   Allergy    Diverticulitis    History of hiatal hernia    Hypertension    LBBB (left bundle branch block)    Migraine    history   Nonobstructive atherosclerosis of coronary artery    Osteoarthritis, hip, bilateral      Objective:   BP 130/84  Pulse 72   Temp 98 F (36.7 C)   Ht 5' (1.524 m)   Wt 165 lb (74.8 kg)   SpO2 97%   BMI 32.22 kg/m  She was weighed on the bioimpedance scale: Body mass index is 32.22 kg/m.  Peak Weight:165 , Body Fat%:46, Visceral Fat Rating:14, Weight trend over  the last 12 months: Unchanged  General:  Alert, oriented and cooperative. Patient is in no acute distress.  Respiratory: Normal respiratory effort, no problems with respiration noted   Gait: able to ambulate independently  Mental Status: Normal mood and affect. Normal behavior. Normal judgment and thought content.   DIAGNOSTIC DATA REVIEWED:  BMET    Component Value Date/Time   NA 143 11/04/2023 1053   NA 142 11/25/2020 1023   K 3.8 11/04/2023 1053   CL 108 11/04/2023 1053   CO2 26 11/04/2023 1053   GLUCOSE 102 (H) 11/04/2023 1053   BUN 17 11/04/2023 1053   BUN 17 11/25/2020 1023   CREATININE 0.71 11/04/2023 1053   CALCIUM  9.7 11/04/2023 1053   GFRNONAA >60 05/22/2023 0654   GFRAA >60 05/21/2017 1834   Lab Results  Component Value Date   HGBA1C 5.3 11/04/2023   HGBA1C 5.5 12/19/2014   No results found for: "INSULIN " CBC    Component Value Date/Time   WBC 5.1 05/22/2023 0654   RBC 3.13 (L) 05/22/2023 0654   HGB 9.9 (L) 05/22/2023 0709   HCT 29.0 (L) 05/22/2023 0709   PLT 213 05/22/2023 0654   MCV 94.2 05/22/2023 0654   MCH 31.0 05/22/2023 0654   MCHC 32.9 05/22/2023 0654   RDW 13.7 05/22/2023 0654   Iron/TIBC/Ferritin/ %Sat No results found for: "IRON", "TIBC", "FERRITIN", "IRONPCTSAT" Lipid Panel     Component Value Date/Time   CHOL 203 (H) 11/04/2023 1053   TRIG 147.0 11/04/2023 1053   HDL 57.00 11/04/2023 1053   CHOLHDL 4 11/04/2023 1053   VLDL 29.4 11/04/2023 1053   LDLCALC 117 (H) 11/04/2023 1053   LDLDIRECT 120.6 08/22/2011 0821   Hepatic Function Panel     Component Value Date/Time   PROT 7.7 11/04/2023 1053   ALBUMIN 4.8 11/04/2023 1053   AST 20 11/04/2023 1053   ALT 19 11/04/2023 1053   ALKPHOS 116 11/04/2023 1053   BILITOT 0.6 11/04/2023 1053   BILIDIR 0.1 08/29/2016 0928   IBILI NOT CALCULATED 09/30/2013 1427      Component Value Date/Time   TSH 0.58 10/24/2022 0839     Assessment and Plan:   Essential hypertension Assessment &  Plan: Blood pressure at goal for age and risk category.  On amlodipine  and losartan  without adverse effects.  Most recent renal parameters reviewed which showed normal electrolytes and kidney function.  Continue with weight loss therapy. Losing 10% may improve blood pressure control. Monitor for symptoms of orthostasis while losing weight. Continue current regimen and home monitoring for a goal blood pressure of 120/80.     Pure hypercholesterolemia Assessment & Plan: LDL is not at goal. Elevated LDL may be secondary to nutrition, genetics and spillover effect from excess adiposity. Recommended LDL goal is <70 to reduce the risk of fatty streaks and the progression to obstructive ASCVD in the future.   Her 10 year risk is: The 10-year ASCVD risk score (Arnett DK, et al., 2019) is: 15.9%  Lab Results  Component Value Date   CHOL 203 (H) 11/04/2023   HDL 57.00 11/04/2023   LDLCALC 117 (H) 11/04/2023   LDLDIRECT 120.6 08/22/2011  TRIG 147.0 11/04/2023   CHOLHDL 4 11/04/2023    Continue weight loss therapy, losing 10% or more of body weight may improve condition. Also advised to reduce saturated fats in diet to less than 10% of daily calories.  She did not tolerate statins in the past this will be revisited as she has an increased cardiovascular risk.      Class 1 obesity with serious comorbidity and body mass index (BMI) of 32.0 to 32.9 in adult, unspecified obesity type Assessment & Plan: Stage 1 obesity with a BMI of 32, body fat percentage of 46%, and visceral fat of 14. Goals include reducing body fat to 36% or less and visceral fat to 10 or less.   We reviewed obesity is a chronic disease, contributing factors and why is a still difficult to treat.  We also discussed our approach as a medically supervised weight management program.  We reviewed anthropometrics, biometrics, associated medical conditions and contributing factors with patient. she would benefit from a medically  tailored reduced calorie nutrional plan based on her REE (resting energy expenditure), which will be determined by indirect calorimetry.  We will also assess for cardiometabolic risk and nutritional derangements via fasting labs at intake appointment.     Statin intolerance - tried several in past, 2-3 years ago Assessment & Plan: Patient intolerant to statin therapy     History of hip replacement Recent hip replacement surgery with associated weight gain during recovery. Limited physical activity due to post-surgical recovery. Gradual weight loss is important to improve mobility and energy levels post-surgery. Strengthening exercises are essential to prevent muscle loss and improve bone health. - Incorporate strengthening exercises to aid in recovery and prevent muscle loss. - Monitor weight loss to ensure it is gradual and sustainable.  Recording duration: 40 minutes              Obesity Treatment / Action Plan:  Patient will work on garnering support from family and friends to begin weight loss journey. Will work on eliminating or reducing the presence of highly palatable, calorie dense foods in the home. Will complete provided nutritional and psychosocial assessment questionnaire before the next appointment. Will be scheduled for indirect calorimetry to determine resting energy expenditure in a fasting state.  This will allow us  to create a reduced calorie, high-protein meal plan to promote loss of fat mass while preserving muscle mass. Counseled on the health benefits of losing 5%-15% of total body weight. Was counseled on nutritional approaches to weight loss and benefits of reducing processed foods and consuming plant-based foods and high quality protein as part of nutritional weight management. Was counseled on pharmacotherapy and role as an adjunct in weight management.   Obesity Education Performed Today:  She was weighed on the bioimpedance scale and results were  discussed and documented in the synopsis.  We discussed obesity as a disease and the importance of a more detailed evaluation of all the factors contributing to the disease.  We discussed the importance of long term lifestyle changes which include nutrition, exercise and behavioral modifications as well as the importance of customizing this to her specific health and social needs.  We discussed the benefits of reaching a healthier weight to alleviate the symptoms of existing conditions and reduce the risks of the biomechanical, metabolic and psychological effects of obesity.  Cheryl Barnes appears to be in the action stage of change and states they are ready to start intensive lifestyle modifications and behavioral modifications.  I have  spent 40 minutes in the care of the patient today including: 3 minutes before the visit reviewing and prepping the chart 40 minutes face-to-face assessing and reviewing listed medical problems as outlined in obesity care plan, providing nutritional and behavioral counseling as outlined in obesity care plan, independently interpreting results and goals of care, see listed medical problems, discussing biometric information and progress, reviewing pertinent diagnostics which are listed under medical problems, and educating patient on obesity is a chronic disease, informing her of contributing factors, weight management strategies and outlining program components. 7 minutes after the visit updating chart and documentation   Reviewed by clinician on day of visit: allergies, medications, problem list, medical history, surgical history, family history, social history, and previous encounter notes pertinent to obesity diagnosis.   Ladd Picker, MD

## 2023-12-17 NOTE — Assessment & Plan Note (Signed)
 Stage 1 obesity with a BMI of 32, body fat percentage of 46%, and visceral fat of 14. Goals include reducing body fat to 36% or less and visceral fat to 10 or less.   We reviewed obesity is a chronic disease, contributing factors and why is a still difficult to treat.  We also discussed our approach as a medically supervised weight management program.  We reviewed anthropometrics, biometrics, associated medical conditions and contributing factors with patient. she would benefit from a medically tailored reduced calorie nutrional plan based on her REE (resting energy expenditure), which will be determined by indirect calorimetry.  We will also assess for cardiometabolic risk and nutritional derangements via fasting labs at intake appointment.

## 2023-12-17 NOTE — Assessment & Plan Note (Signed)
 Blood pressure at goal for age and risk category.  On amlodipine  and losartan  without adverse effects.  Most recent renal parameters reviewed which showed normal electrolytes and kidney function.  Continue with weight loss therapy. Losing 10% may improve blood pressure control. Monitor for symptoms of orthostasis while losing weight. Continue current regimen and home monitoring for a goal blood pressure of 120/80.

## 2023-12-17 NOTE — Assessment & Plan Note (Signed)
 LDL is not at goal. Elevated LDL may be secondary to nutrition, genetics and spillover effect from excess adiposity. Recommended LDL goal is <70 to reduce the risk of fatty streaks and the progression to obstructive ASCVD in the future.   Her 10 year risk is: The 10-year ASCVD risk score (Arnett DK, et al., 2019) is: 15.9%  Lab Results  Component Value Date   CHOL 203 (H) 11/04/2023   HDL 57.00 11/04/2023   LDLCALC 117 (H) 11/04/2023   LDLDIRECT 120.6 08/22/2011   TRIG 147.0 11/04/2023   CHOLHDL 4 11/04/2023    Continue weight loss therapy, losing 10% or more of body weight may improve condition. Also advised to reduce saturated fats in diet to less than 10% of daily calories.  She did not tolerate statins in the past this will be revisited as she has an increased cardiovascular risk.

## 2023-12-17 NOTE — Assessment & Plan Note (Signed)
 Patient intolerant to statin therapy.

## 2023-12-18 ENCOUNTER — Encounter (INDEPENDENT_AMBULATORY_CARE_PROVIDER_SITE_OTHER): Admitting: Physician Assistant

## 2023-12-18 NOTE — Progress Notes (Unsigned)
 [facility] History and Physical   Primary Care Physician:  [place], [name], MD   Reason for Procedure:  Colorectal cancer screening  Plan:    Screening colonoscopy     HPI: Aylee Littrell is a 73 y.o. female undergoing screening colonoscopy for colorectal cancer screening.  Records indicate that patient had a colonoscopy in 2015 that was overall normal -there was evidence of diverticulosis and internal hemorrhoids but no polyps.  Chart review documents family history of colorectal cancer in patient's father.  Patient denies change in bowel habits or rectal bleeding at the time of this exam.   Past Medical History:  Diagnosis Date   Allergy    Diverticulitis    History of hiatal hernia    Hypertension    LBBB (left bundle branch block)    Migraine    history   Nonobstructive atherosclerosis of coronary artery    Osteoarthritis, hip, bilateral     Past Surgical History:  Procedure Laterality Date   CESAREAN SECTION     x2   CHOLECYSTECTOMY     PARTIAL HYSTERECTOMY     Abdominal   ROTATOR CUFF REPAIR Left    ROTATOR CUFF REPAIR Right    TONSILLECTOMY     removed as a child   TOTAL HIP ARTHROPLASTY Right 05/13/2023   Procedure: RIGHT TOTAL HIP REPLACEMENT;  Surgeon: Wes Hamman, MD;  Location: [facility] OR;  Service: Orthopedics;  Laterality: Right;  3-C   TYMPANOSTOMY TUBE PLACEMENT Right 2022    Prior to Admission medications   Medication Sig Start Date End Date Taking? Authorizing Provider  amLODipine  (NORVASC ) 5 MG tablet TAKE 1 TABLET DAILY 11/11/23   [place], [name], MD  cetirizine (ZYRTEC) 10 MG tablet Take 10 mg by mouth every evening.    [provider]  docusate sodium  (COLACE) 100 MG capsule Take 1 capsule (100 mg total) by mouth daily as needed. 05/06/23 05/05/24  Sandie Cross, PA-C  gabapentin  (NEURONTIN ) 100 MG capsule TAKE 1 CAPSULE(100 MG) BY MOUTH TWICE DAILY 12/04/23   Sandie Cross, PA-C  Homeopathic Products (CLEAR TINNITUS)  CAPS Take 1 capsule by mouth daily after supper. Silencil Max for Tinnitus    [provider]  losartan  (COZAAR ) 50 MG tablet TAKE 1 TABLET DAILY Patient taking differently: Take 50 mg by mouth every evening. 04/15/23   [place], [name], MD  melatonin 5 MG TABS Take 5 mg by mouth at bedtime as needed (sleep).    [provider]  tiZANidine  (ZANAFLEX ) 4 MG tablet TAKE 1 TABLET(4 MG) BY MOUTH DAILY AS NEEDED FOR MUSCLE SPASMS 12/09/23   Diedra Fowler, MD  valACYclovir  (VALTREX ) 1000 MG tablet Take 2 tablets by mouth as needed upon acute onset and asap repeat dose in 12 hours 05/20/23   [place], [name], MD    Current Outpatient Medications  Medication Sig Dispense Refill   amLODipine  (NORVASC ) 5 MG tablet TAKE 1 TABLET DAILY 90 tablet 3   cetirizine (ZYRTEC) 10 MG tablet Take 10 mg by mouth every evening.     gabapentin  (NEURONTIN ) 100 MG capsule TAKE 1 CAPSULE(100 MG) BY MOUTH TWICE DAILY 60 capsule 1   losartan  (COZAAR ) 50 MG tablet TAKE 1 TABLET DAILY (Patient taking differently: Take 50 mg by mouth every evening.) 90 tablet 3   tiZANidine  (ZANAFLEX ) 4 MG tablet TAKE 1 TABLET(4 MG) BY MOUTH DAILY AS NEEDED FOR MUSCLE SPASMS 20 tablet 0   docusate sodium  (COLACE) 100 MG capsule Take 1  capsule (100 mg total) by mouth daily as needed. (Patient not taking: Reported on 12/19/2023) 30 capsule 2   Homeopathic Products (CLEAR TINNITUS) CAPS Take 1 capsule by mouth daily after supper. Silencil Max for Tinnitus (Patient not taking: Reported on 12/19/2023)     melatonin 5 MG TABS Take 5 mg by mouth at bedtime as needed (sleep).     valACYclovir  (VALTREX ) 1000 MG tablet Take 2 tablets by mouth as needed upon acute onset and asap repeat dose in 12 hours 16 tablet 2   Current Facility-Administered Medications  Medication Dose Route Frequency Provider Last Rate Last Admin   0.9 %  sodium chloride  infusion  500 mL Intravenous Once Demarkus Remmel, Scarlette Currier, MD        Allergies as of 12/19/2023 -  Review Complete 12/19/2023  Allergen Reaction Noted   Penicillins Itching and Rash 03/17/2007   Latex Itching and Rash 09/30/2013   Seasonal ic [cholestatin] Other (See Comments) 02/07/2016   Tape Other (See Comments) 05/08/2016    Family History  Problem Relation Age of Onset   Hypertension Mother    Breast cancer Mother        breast   Cancer Mother        breast   Hypertension Father    Diabetes Father        adult onset   Colon cancer Father    Hypertension Brother    Diabetes Brother    Asthma Maternal Grandfather    Stomach cancer Neg Hx    Pancreatic cancer Neg Hx    Rectal cancer Neg Hx     Social History   Socioeconomic History   Marital status: Single    Spouse name: Not on file   Number of children: 2   Years of education: Not on file   Highest education level: Not on file  Occupational History   Occupation: ophthalmology @ [employer]    Employer: [employer]  Tobacco Use   Smoking status: Never   Smokeless tobacco: Never  Vaping Use   Vaping status: Never Used  Substance and Sexual Activity   Alcohol use: No   Drug use: No   Sexual activity: Never  Other Topics Concern   Not on file  Social History Narrative   Not on file   Social Drivers of Health   Financial Resource Strain: Not on file  Food Insecurity: Low Risk  (12/03/2023)   Received from [facility]   Hunger Vital Sign    Worried About Running Out of Food in the Last Year: Never true    Ran Out of Food in the Last Year: Never true  Transportation Needs: No Transportation Needs (12/03/2023)   Received from [facility]    In the past 12 months, has lack of reliable transportation kept you from medical appointments, meetings, work or from getting things needed for daily living? : No  Physical Activity: Not on file  Stress: Not on file  Social Connections: Not on file  Intimate Partner Violence: Not on file    Review of Systems:  All other review of systems negative  except as mentioned in the HPI.  Physical Exam: Vital signs BP (!) 156/87   Pulse 74   Temp 98.2 F (36.8 C)   Ht 5\' 1"  (1.549 m)   Wt 165 lb (74.8 kg)   SpO2 97%   BMI 31.18 kg/m   General:   Alert,  Well-developed, well-nourished, pleasant and cooperative in NAD Airway:  Mallampati 2 Lungs:  Clear throughout to auscultation.   Heart:  Regular rate and rhythm; no murmurs, clicks, rubs,  or gallops. Abdomen:  Soft, nontender and nondistended. Normal bowel sounds.   Neuro/Psych:  Normal mood and affect. A and O x 3  Eugenia Hess, MD [facility]

## 2023-12-19 ENCOUNTER — Encounter: Payer: Self-pay | Admitting: Pediatrics

## 2023-12-19 ENCOUNTER — Ambulatory Visit: Admitting: Pediatrics

## 2023-12-19 VITALS — BP 172/76 | HR 63 | Temp 98.2°F | Resp 18 | Ht 61.0 in | Wt 165.0 lb

## 2023-12-19 DIAGNOSIS — Z800 Family history of malignant neoplasm of digestive organs: Secondary | ICD-10-CM

## 2023-12-19 DIAGNOSIS — K648 Other hemorrhoids: Secondary | ICD-10-CM | POA: Diagnosis not present

## 2023-12-19 DIAGNOSIS — K5730 Diverticulosis of large intestine without perforation or abscess without bleeding: Secondary | ICD-10-CM

## 2023-12-19 DIAGNOSIS — Z1211 Encounter for screening for malignant neoplasm of colon: Secondary | ICD-10-CM | POA: Diagnosis not present

## 2023-12-19 DIAGNOSIS — D124 Benign neoplasm of descending colon: Secondary | ICD-10-CM

## 2023-12-19 DIAGNOSIS — K635 Polyp of colon: Secondary | ICD-10-CM | POA: Diagnosis not present

## 2023-12-19 MED ORDER — SODIUM CHLORIDE 0.9 % IV SOLN: 500.0000 mL | Freq: Once | INTRAVENOUS | Status: DC

## 2023-12-19 NOTE — Progress Notes (Signed)
 Pt's states no medical or surgical changes since previsit or office visit.

## 2023-12-19 NOTE — Progress Notes (Signed)
 Vss nad trans to pacu

## 2023-12-19 NOTE — Op Note (Signed)
 [facility] Patient Name: Cheryl Barnes Procedure Date: 12/19/2023 11:10 AM MRN: [id number] Endoscopist: Eugenia Hess , MD, [id number] Age: 73 Referring MD:  Date of Birth: 05-22-1951 Gender: Female Account #: [id number] Procedure:                Colonoscopy Indications:              Screening in patient at increased risk: Family                            history of 1st-degree relative with colorectal                            cancer, Last colonoscopy: 2015 Medicines:                Monitored Anesthesia Care Procedure:                Pre-Anesthesia Assessment:                           - Prior to the procedure, a History and Physical                            was performed, and patient medications and                            allergies were reviewed. The patient's tolerance of                            previous anesthesia was also reviewed. The risks                            and benefits of the procedure and the sedation                            options and risks were discussed with the patient.                            All questions were answered, and informed consent                            was obtained. Prior Anticoagulants: The patient has                            taken no anticoagulant or antiplatelet agents. ASA                            Grade Assessment: II - A patient with mild systemic                            disease. After reviewing the risks and benefits,                            the patient was deemed in satisfactory condition to  undergo the procedure.                           After obtaining informed consent, the colonoscope                            was passed under direct vision. Throughout the                            procedure, the patient's blood pressure, pulse, and                            oxygen saturations were monitored continuously. The                            Olympus Scope [id number] was  introduced through the                            anus and advanced to the cecum, identified by                            appendiceal orifice and ileocecal valve. The                            colonoscopy was performed without difficulty. The                            patient tolerated the procedure well. The quality                            of the bowel preparation was good. The ileocecal                            valve, appendiceal orifice, and rectum were                            photographed. Scope In: 11:23:48 AM Scope Out: 11:42:38 AM Scope Withdrawal Time: 0 hours 14 minutes 1 second  Total Procedure Duration: 0 hours 18 minutes 50 seconds  Findings:                 Hemorrhoids were found on perianal exam.                           The digital rectal exam was normal. Pertinent                            negatives include normal sphincter tone and no                            palpable rectal lesions.                           Multiple medium-mouthed and small-mouthed  diverticula were found in the sigmoid colon,                            descending colon, transverse colon and ascending                            colon.                           A 5 mm polyp was found in the descending colon. The                            polyp was sessile. The polyp was removed with a                            cold snare. Resection and retrieval were complete.                           Internal hemorrhoids were found during retroflexion. Complications:            No immediate complications. Estimated blood loss:                            Minimal. Estimated Blood Loss:     Estimated blood loss was minimal. Impression:               - Hemorrhoids found on perianal exam.                           - Diverticulosis in the sigmoid colon, in the                            descending colon, in the transverse colon and in                            the ascending  colon.                           - One 5 mm polyp in the descending colon, removed                            with a cold snare. Resected and retrieved.                           - Internal hemorrhoids. Recommendation:           - Discharge patient to home (ambulatory).                           - Await pathology results.                           - Patient is now 73 years of age. By guidelines                            would be due in 5  years for next colonoscopy given                            family history of colorectal cancer in a                            first-degree relative. She will be 73 years of age                            at that time. Recommend that patient review risk                            versus benefits of ongoing colon polyp                            screening/surveillance with PCP beyond the age of                            54. Decisions are individualized based upon patient                            risk factors and medical comorbidities.                           - The findings and recommendations were discussed                            with the designated responsible adult.                           - Return to referring physician.                           - Patient has a contact number available for                            emergencies. The signs and symptoms of potential                            delayed complications were discussed with the                            patient. Return to normal activities tomorrow.                            Written discharge instructions were provided to the                            patient. Eugenia Hess, MD 12/19/2023 11:48:07 AM This report has been signed electronically.

## 2023-12-19 NOTE — Patient Instructions (Signed)
 Resume previous diet and medications. Awaiting pathology results.  Handout provided on Diverticulosis, Colon polyps and Hemorrhoids  YOU HAD AN ENDOSCOPIC PROCEDURE TODAY AT THE [facility]:   Refer to the procedure report that was given to you for any specific questions about what was found during the examination.  If the procedure report does not answer your questions, please call your gastroenterologist to clarify.  If you requested that your care partner not be given the details of your procedure findings, then the procedure report has been included in a sealed envelope for you to review at your convenience later.  YOU SHOULD EXPECT: Some feelings of bloating in the abdomen. Passage of more gas than usual.  Walking can help get rid of the air that was put into your GI tract during the procedure and reduce the bloating. If you had a lower endoscopy (such as a colonoscopy or flexible sigmoidoscopy) you may notice spotting of blood in your stool or on the toilet paper. If you underwent a bowel prep for your procedure, you may not have a normal bowel movement for a few days.  Please Note:  You might notice some irritation and congestion in your nose or some drainage.  This is from the oxygen used during your procedure.  There is no need for concern and it should clear up in a day or so.  SYMPTOMS TO REPORT IMMEDIATELY:  Following lower endoscopy (colonoscopy or flexible sigmoidoscopy):  Excessive amounts of blood in the stool  Significant tenderness or worsening of abdominal pains  Swelling of the abdomen that is new, acute  Fever of 100F or higher  For urgent or emergent issues, a gastroenterologist can be reached at any hour by calling [phone]. Do not use MyChart messaging for urgent concerns.    DIET:  We do recommend a small meal at first, but then you may proceed to your regular diet.  Drink plenty of fluids but you should avoid alcoholic beverages for 24  hours.  ACTIVITY:  You should plan to take it easy for the rest of today and you should NOT DRIVE or use heavy machinery until tomorrow (because of the sedation medicines used during the test).    FOLLOW UP: Our staff will call the number listed on your records the next business day following your procedure.  We will call around 7:15- 8:00 am to check on you and address any questions or concerns that you may have regarding the information given to you following your procedure. If we do not reach you, we will leave a message.     If any biopsies were taken you will be contacted by phone or by letter within the next 1-3 weeks.  Please call us  at [phone] if you have not heard about the biopsies in 3 weeks.    SIGNATURES/CONFIDENTIALITY: You and/or your care partner have signed paperwork which will be entered into your electronic medical record.  These signatures attest to the fact that that the information above on your After Visit Summary has been reviewed and is understood.  Full responsibility of the confidentiality of this discharge information lies with you and/or your care-partner.

## 2023-12-19 NOTE — Progress Notes (Signed)
 Called to room to assist during endoscopic procedure.  Patient ID and intended procedure confirmed with present staff. Received instructions for my participation in the procedure from the performing physician.

## 2023-12-20 ENCOUNTER — Telehealth: Payer: Self-pay

## 2023-12-20 NOTE — Telephone Encounter (Signed)
 No answer after follow up call. Voice message left.

## 2023-12-23 LAB — SURGICAL PATHOLOGY

## 2023-12-24 ENCOUNTER — Encounter: Payer: Self-pay | Admitting: Pediatrics

## 2023-12-26 ENCOUNTER — Other Ambulatory Visit: Payer: Self-pay | Admitting: Orthopedic Surgery

## 2023-12-27 ENCOUNTER — Ambulatory Visit: Payer: Self-pay

## 2023-12-27 NOTE — Telephone Encounter (Signed)
 Noted.

## 2023-12-27 NOTE — Telephone Encounter (Signed)
  Chief Complaint: HTN Symptoms: intermittent headache Frequency: 2 weeks Pertinent Negatives: Patient denies fever, CP, SOB Disposition: [] ED /[] Urgent Care (no appt availability in office) / [x] Appointment(In office/virtual)/ []  [facility]/ [] Home Care/ [] Refused Recommended Disposition /[]  Mobile Bus/ []  Follow-up with PCP Additional Notes: Pt c/o of HTN and intermittent H/A. Pt has hx of HTN and is on medications. Pt reports has not taken meds this AM, because she has not eaten breakfast. Of note, pt has concerns about taking BP medication with Gabapentin , as she read that Gabapentin  can lower BP. Triager instructed pt to take medications as prescribed. Scheduled patient per protocol on 12/30/2023. Patient verbalized understanding and to call back with worsening symptoms.     Copied from CRM [id number]. Topic: Clinical - Red Word Triage >> Dec 27, 2023  9:11 AM Marlan Silva wrote: Red Word that prompted transfer to Nurse Triage: Patient called in stating that her blood pressure was uncontrolled even while taking her medication and she is requesting to come in on a same day appointment. Patient is not having any symptoms and stated that her last reading (5 mins ago) was 172/96. Reason for Disposition  [1] Systolic BP  >= 130 OR Diastolic >= 80 AND [2] taking BP medications  Answer Assessment - Initial Assessment Questions 1. BLOOD PRESSURE: "What is the blood pressure?" "Did you take at least two measurements 5 minutes apart?"     172/96 - just before transfer 2. ONSET: "When did you take your blood pressure?"     Just before transfer 0920: 150/91 3. HOW: "How did you take your blood pressure?" (e.g., automatic home BP monitor, visiting nurse)     auto 4. HISTORY: "Do you have a history of high blood pressure?"     yes 5. MEDICINES: "Are you taking any medicines for blood pressure?" "Have you missed any doses recently?"     Amlodipine  Losartan  Has not taken meds this  AM d/t not eating breakfast yet 6. OTHER SYMPTOMS: "Do you have any symptoms?" (e.g., blurred vision, chest pain, difficulty breathing, headache, weakness)     H/A for the last 2 weeks - occurs randomly, pt reports taking BP during events and BP is elevated  Protocols used: Blood Pressure - High-A-AH

## 2023-12-30 ENCOUNTER — Encounter: Payer: Self-pay | Admitting: Family Medicine

## 2023-12-30 ENCOUNTER — Ambulatory Visit (INDEPENDENT_AMBULATORY_CARE_PROVIDER_SITE_OTHER): Admitting: Family Medicine

## 2023-12-30 VITALS — BP 155/85 | HR 70 | Temp 98.2°F | Resp 16 | Ht 61.0 in | Wt 168.0 lb

## 2023-12-30 DIAGNOSIS — R519 Headache, unspecified: Secondary | ICD-10-CM | POA: Diagnosis not present

## 2023-12-30 DIAGNOSIS — R600 Localized edema: Secondary | ICD-10-CM | POA: Diagnosis not present

## 2023-12-30 DIAGNOSIS — R0789 Other chest pain: Secondary | ICD-10-CM

## 2023-12-30 DIAGNOSIS — I10 Essential (primary) hypertension: Secondary | ICD-10-CM | POA: Diagnosis not present

## 2023-12-30 MED ORDER — LOSARTAN POTASSIUM 50 MG PO TABS: 100.0000 mg | ORAL_TABLET | Freq: Every evening | ORAL | Status: DC

## 2023-12-30 NOTE — Patient Instructions (Addendum)
 A few things to remember from today's visit:  Essential hypertension  Today we increased the dose of Losartan  from 50 mg to 100 mg, 2 tabs daily and take it with Amlodipine  5 mg at bedtime. Blood pressures in 2-3 weeks.  If you need refills for medications you take chronically, please call your pharmacy. Do not use My Chart to request refills or for acute issues that need immediate attention. If you send a my chart message, it may take a few days to be addressed, specially if I am not in the office.  Please be sure medication list is accurate. If a new problem present, please set up appointment sooner than planned today.

## 2023-12-30 NOTE — Progress Notes (Signed)
 HPI: [name] is a 73 y.o. female with a PMHx significant for HTN, HLD, LBBB, diverticulosis, aortic atherosclerosis, and thyroid  nodule, who is here today for hypertension management.  Last seen on 11/04/2023  Chief Complaint  Patient presents with   Hypertension    Has been running high, feet & ankle swelling and headaches.    Headaches:  Patient complains of intermittent frontal headaches for a month. This is a new problem for her.  She describes the headache as "light" and "annoying." Taking tylenol  for the pain.  Denies associated nausea, vomiting, or photophobia.   Elevated BP/HTN:  She has been having blood pressures in the 140s-160s/80s, with a high reading of 176/96. She has been checking it since switching to taking a half pill of amlodipine  after her visit on 4/21.  Currently on amlodipine  5 mg daily and losartan  50 mg daily. At her last appointment, she was switched to 2.5 mg amlodipine  but resumed her old dose after three days when her BP readings rose.   Endorses one time tightness in her upper chest within the last month when at rest, but not with exertion. Has had one recent episode of heartburn.   She saw cardiology in the past for left-sided chest pain in 10/2021. Cardiac work up otherwise negative.  Also mentions she has had some ankle edema some days at the end of the day.   She has been trying to avoid salt.  Exercise: She occasionally does chair exercises at home.  Negative for visual changes, dyspnea, palpitations, focal weakness, nausea, changes in urine, or new medications.  Lab Results  Component Value Date   CREATININE 0.71 11/04/2023   BUN 17 11/04/2023   NA 143 11/04/2023   K 3.8 11/04/2023   CL 108 11/04/2023   CO2 26 11/04/2023   Review of Systems  Constitutional:  Negative for activity change, appetite change and fever.  Respiratory:  Negative for cough and wheezing.   Gastrointestinal:  Negative for abdominal pain, nausea and  vomiting.  Genitourinary:  Negative for decreased urine volume, dysuria and hematuria.  Skin:  Negative for rash.  Allergic/Immunologic: Positive for environmental allergies.  Neurological:  Negative for syncope and facial asymmetry.  See other pertinent positives and negatives in HPI.  Current Outpatient Medications on File Prior to Visit  Medication Sig Dispense Refill   amLODipine  (NORVASC ) 5 MG tablet TAKE 1 TABLET DAILY 90 tablet 3   cetirizine (ZYRTEC) 10 MG tablet Take 10 mg by mouth every evening.     docusate sodium  (COLACE) 100 MG capsule Take 1 capsule (100 mg total) by mouth daily as needed. 30 capsule 2   gabapentin  (NEURONTIN ) 100 MG capsule TAKE 1 CAPSULE(100 MG) BY MOUTH TWICE DAILY 60 capsule 1   Homeopathic Products (CLEAR TINNITUS) CAPS Take 1 capsule by mouth daily after supper. Silencil Max for Tinnitus     melatonin 5 MG TABS Take 5 mg by mouth at bedtime as needed (sleep).     tiZANidine  (ZANAFLEX ) 4 MG tablet TAKE 1 TABLET(4 MG) BY MOUTH DAILY AS NEEDED FOR MUSCLE SPASMS 20 tablet 0   valACYclovir  (VALTREX ) 1000 MG tablet Take 2 tablets by mouth as needed upon acute onset and asap repeat dose in 12 hours 16 tablet 2   No current facility-administered medications on file prior to visit.    Past Medical History:  Diagnosis Date   Allergy    Diverticulitis    History of hiatal hernia    Hypertension  LBBB (left bundle branch block)    Migraine    history   Nonobstructive atherosclerosis of coronary artery    Osteoarthritis, hip, bilateral    Allergies  Allergen Reactions   Penicillins Itching and Rash    Has patient had a PCN reaction causing immediate rash, facial/tongue/throat swelling, SOB or lightheadedness with hypotension: Yes Has patient had a PCN reaction causing severe rash involving mucus membranes or skin necrosis: No Has patient had a PCN reaction that required hospitalization No Has patient had a PCN reaction occurring within the last 10  years: No If all of the above answers are "NO", then may proceed with Cephalosporin use.    Latex Itching and Rash   Seasonal Ic [Cholestatin] Other (See Comments)    Upper resp, sore throat, sneezing, etc.   Tape Other (See Comments)    Tape causes some redness & causes BLISTERS if left on for too long    Social History   Socioeconomic History   Marital status: Single    Spouse name: Not on file   Number of children: 2   Years of education: Not on file   Highest education level: Not on file  Occupational History   Occupation: ophthalmology @ [employer]    Employer: [employer]  Tobacco Use   Smoking status: Never   Smokeless tobacco: Never  Vaping Use   Vaping status: Never Used  Substance and Sexual Activity   Alcohol use: No   Drug use: No   Sexual activity: Never  Other Topics Concern   Not on file  Social History Narrative   Not on file   Social Drivers of Health   Financial Resource Strain: Not on file  Food Insecurity: Low Risk  (12/03/2023)   Received from [facility]   Hunger Vital Sign    Worried About Running Out of Food in the Last Year: Never true    Ran Out of Food in the Last Year: Never true  Transportation Needs: No Transportation Needs (12/03/2023)   Received from [facility]    In the past 12 months, has lack of reliable transportation kept you from medical appointments, meetings, work or from getting things needed for daily living? : No  Physical Activity: Not on file  Stress: Not on file  Social Connections: Not on file    Vitals:   12/30/23 1304 12/30/23 1337  BP: (!) 140/78 (!) 155/85  Pulse: 70   Resp: 16   Temp: 98.2 F (36.8 C)   SpO2: 95%    Body mass index is 31.74 kg/m.  Physical Exam Vitals and nursing note reviewed.  Constitutional:      General: She is not in acute distress.    Appearance: She is well-developed.  HENT:     Head: Normocephalic and atraumatic.     Mouth/Throat:     Mouth: Mucous  membranes are moist.     Pharynx: Oropharynx is clear. Uvula midline.  Eyes:     Conjunctiva/sclera: Conjunctivae normal.  Cardiovascular:     Rate and Rhythm: Normal rate and regular rhythm.     Pulses:          Dorsalis pedis pulses are 2+ on the right side and 2+ on the left side.     Heart sounds: No murmur heard.    Comments: Trace bilateral pitting edema Pulmonary:     Effort: Pulmonary effort is normal. No respiratory distress.     Breath sounds: Normal breath sounds.  Abdominal:     Palpations: Abdomen is soft. There is no hepatomegaly or mass.     Tenderness: There is no abdominal tenderness.  Musculoskeletal:     Right lower leg: Pitting Edema present.     Left lower leg: Pitting Edema present.  Lymphadenopathy:     Cervical: No cervical adenopathy.  Skin:    General: Skin is warm.     Findings: No erythema or rash.  Neurological:     General: No focal deficit present.     Mental Status: She is alert and oriented to person, place, and time.     Cranial Nerves: No cranial nerve deficit.     Gait: Gait normal.  Psychiatric:        Mood and Affect: Mood and affect normal.    ASSESSMENT AND PLAN:  Ms. Ufford was seen today for hypertension management.   Essential hypertension Assessment & Plan: BP is not well controlled. Possible complications of elevated BP discussed. After discussion of some options, including changing to a different ARB, she prefers to increase dose of losartan  from 50 mg to 100 mg. Continue Amlodipine  5 mg daily. We discussed some side effects of medications. Continue monitoring BP regularly, instructed to let me know about BP readings in 2 to 3 weeks. Low salt/DASH diet to continue. Follow-up in 2 months, before if needed.  Orders: -     Losartan  Potassium; Take 2 tablets (100 mg total) by mouth every evening.  Frontal headache We discussed possible etiologies, could be related to HTN and allergies among some. It is mild. Continue taking  Tylenol  500 mg daily as needed. Monitor for new symptoms. I do not think imaging is needed at this time. Follow-up in 2 months.  Bilateral lower extremity edema Mild. Recommend lower extremity elevation and compression stockings. Amlodipine  because of aggravated problem, recommend taking it with losartan  at bedtime.  Chest discomfort Described as tightness, one time at rest and no associated symptoms. Coronary CTA with calcium  score zero 11/2020.  Possible causes discussed, ? GERD related. Decided to hold on EKG for now, clearly instructed about warning signs.  Return in about 2 months (around 02/29/2024) for chronic problems.  I, Fritz Jewel Wierda, acting as a scribe for Shariyah Eland [place], MD., have documented all relevant documentation on the behalf of Derran Sear [place], MD, as directed by  Kruz Chiu [place], MD while in the presence of Nekeya Briski [place], MD.   I, Marites Nath [place], MD, have reviewed all documentation for this visit. The documentation on 12/30/23 for the exam, diagnosis, procedures, and orders are all accurate and complete.  Lachrisha Ziebarth G. [place], MD  [facility]. [place] [facility].

## 2023-12-30 NOTE — Assessment & Plan Note (Addendum)
 BP is not well controlled. Possible complications of elevated BP discussed. After discussion of some options, including changing to a different ARB, she prefers to increase dose of losartan  from 50 mg to 100 mg. Continue Amlodipine  5 mg daily. We discussed some side effects of medications. Continue monitoring BP regularly, instructed to let me know about BP readings in 2 to 3 weeks. Low salt/DASH diet to continue. Follow-up in 2 months, before if needed.

## 2024-01-27 ENCOUNTER — Encounter: Payer: Self-pay | Admitting: Family Medicine

## 2024-01-29 ENCOUNTER — Encounter: Payer: Self-pay | Admitting: Orthopaedic Surgery

## 2024-01-29 ENCOUNTER — Ambulatory Visit (INDEPENDENT_AMBULATORY_CARE_PROVIDER_SITE_OTHER): Admitting: Orthopaedic Surgery

## 2024-01-29 ENCOUNTER — Encounter (INDEPENDENT_AMBULATORY_CARE_PROVIDER_SITE_OTHER): Payer: Self-pay

## 2024-01-29 ENCOUNTER — Other Ambulatory Visit (INDEPENDENT_AMBULATORY_CARE_PROVIDER_SITE_OTHER): Payer: Self-pay

## 2024-01-29 DIAGNOSIS — Z96641 Presence of right artificial hip joint: Secondary | ICD-10-CM

## 2024-01-29 MED ORDER — GABAPENTIN 100 MG PO CAPS: 100.0000 mg | ORAL_CAPSULE | Freq: Three times a day (TID) | ORAL | 2 refills | Status: AC | PRN

## 2024-01-29 MED ORDER — TIZANIDINE HCL 4 MG PO TABS: 4.0000 mg | ORAL_TABLET | Freq: Three times a day (TID) | ORAL | 3 refills | Status: DC | PRN

## 2024-01-29 NOTE — Progress Notes (Signed)
 Office Visit Note   Patient: Cheryl Barnes           Date of Birth: [birth year]           MRN: [id number] Visit Date: 01/29/2024              Requested by: [place], [name], MD [address] [place],  [place] [address] PCP: [place], [name], MD   Assessment & Plan: Visit Diagnoses:  1. Status post total replacement of right hip     Plan: History of Present Illness Cheryl Barnes is a 73 year old female who presents for follow-up after hip replacement surgery.  She underwent hip replacement surgery on May 13, 2023, and experiences pain during activities such as stair climbing and car entry/exit, with no pain at rest or during sleep. Pain occurs during hip flexion, particularly in the groin, with a severity of 5 to 6 out of 10. Dressing causes discomfort, especially when lifting her leg to put on pants or socks.  Gabapentin  and tizanidine  have alleviated nerve pain from the knee to the groin, but recent refill attempts were declined. She has difficulty lifting her leg onto a table without hand assistance.  Her other hip feels inflamed and sore, and she inquires about using ibuprofen  for this discomfort. No pain is elicited when the leg is rolled, but groin pain occurs during certain movements, particularly when the leg is flexed or rotated.  Physical Exam MUSCULOSKELETAL: Pain in groin area on hip flexion. No pain on leg rotation. No tenderness on outside bursa. Groin pain on external rotation of hip.  Pain with resisted straight leg raise. SKIN: Scar well-healed, no signs of infection.  Assessment and Plan 9 months status post right total hip replacement Persistent groin pain post-hip replacement, exacerbated by certain activities. X-ray shows components intact. Possible scar impingement or iliopsoas impingement. Prefers conservative management. - Refilled gabapentin  and tizanidine  for pain management. - Consider physical therapy if symptoms persist. - Consider  local anesthetic injection in iliopsoas muscle if symptoms persist. - Consider surgical intervention to remove scar tissue if conservative measures fail.  Follow-Up Instructions: Return in about 3 months (around 04/30/2024).   Orders:  Orders Placed This Encounter  Procedures   XR HIP UNILAT W OR W/O PELVIS 2-3 VIEWS RIGHT   Meds ordered this encounter  Medications   tiZANidine  (ZANAFLEX ) 4 MG tablet    Sig: Take 1 tablet (4 mg total) by mouth every 8 (eight) hours as needed for muscle spasms.    Dispense:  30 tablet    Refill:  3   gabapentin  (NEURONTIN ) 100 MG capsule    Sig: Take 1 capsule (100 mg total) by mouth 3 (three) times daily as needed.    Dispense:  60 capsule    Refill:  2    ZERO refills remain on this prescription. Your patient is requesting advance approval of refills for this medication to PREVENT ANY MISSED DOSES     Subjective: Chief Complaint  Patient presents with   Right Hip - Follow-up    Right total hip arthroplasty 05/13/2023   Imaging: XR HIP UNILAT W OR W/O PELVIS 2-3 VIEWS RIGHT Result Date: 01/29/2024 Stable right total hip replacement without complication.    PMFS History: Patient Active Problem List   Diagnosis Date Noted   Statin intolerance - tried several in past, 2-3 years ago 12/16/2023   Tinnitus of both ears 11/04/2023   Status post total replacement of right hip 05/13/2023  Primary osteoarthritis of right hip 05/12/2023   Primary osteoarthritis of left hip 02/06/2023   Statin myopathy 10/24/2022   Atherosclerosis of aorta (HCC) 10/17/2020   Nocturnal leg cramps 10/01/2018   Splenic artery aneurysm (HCC) 03/01/2017   Thyroid  nodule 01/27/2017   Acute diverticulitis 01/24/2017   Recurrent herpes labialis 09/21/2016   Pure hypercholesterolemia 09/05/2016   Class 1 obesity with serious comorbidity and body mass index (BMI) of 32.0 to 32.9 in adult 09/05/2016   Bilateral hip pain 09/14/2015   Medicare annual wellness visit,  subsequent 09/14/2015   Benign hypertension 07/17/2015   Sigmoid diverticulitis 01/27/2015   LBBB (left bundle branch block) 12/23/2014   Hematochezia 12/23/2014   Abdominal pain    Diverticulitis of large intestine without perforation or abscess without bleeding    Chest pain    Solitary pulmonary nodule 06/09/2014   Left lower lobe pneumonia 10/06/2013   Urinary incontinence in female 03/02/2013   Postmenopause atrophic vaginitis 03/02/2013   RUQ PAIN 04/01/2007   Essential hypertension 03/24/2007   Diverticulosis of colon 03/17/2007   Past Medical History:  Diagnosis Date   Allergy    Diverticulitis    History of hiatal hernia    Hypertension    LBBB (left bundle branch block)    Migraine    history   Nonobstructive atherosclerosis of coronary artery    Osteoarthritis, hip, bilateral     Family History  Problem Relation Age of Onset   Hypertension Mother    Breast cancer Mother        breast   Cancer Mother        breast   Hypertension Father    Diabetes Father        adult onset   Colon cancer Father    Hypertension Brother    Diabetes Brother    Asthma Maternal Grandfather    Stomach cancer Neg Hx    Pancreatic cancer Neg Hx    Rectal cancer Neg Hx     Past Surgical History:  Procedure Laterality Date   CESAREAN SECTION     x2   CHOLECYSTECTOMY     PARTIAL HYSTERECTOMY     Abdominal   ROTATOR CUFF REPAIR Left    ROTATOR CUFF REPAIR Right    TONSILLECTOMY     removed as a child   TOTAL HIP ARTHROPLASTY Right 05/13/2023   Procedure: RIGHT TOTAL HIP REPLACEMENT;  Surgeon: Wes Hamman, MD;  Location: [facility] OR;  Service: Orthopedics;  Laterality: Right;  3-C   TYMPANOSTOMY TUBE PLACEMENT Right 2022   Social History   Occupational History   Occupation: ophthalmology @ [employer]    Employer: [employer]  Tobacco Use   Smoking status: Never   Smokeless tobacco: Never  Vaping Use   Vaping status: Never Used  Substance and Sexual Activity   Alcohol use:  No   Drug use: No   Sexual activity: Never

## 2024-02-10 ENCOUNTER — Ambulatory Visit (INDEPENDENT_AMBULATORY_CARE_PROVIDER_SITE_OTHER): Admitting: Internal Medicine

## 2024-02-10 ENCOUNTER — Encounter (INDEPENDENT_AMBULATORY_CARE_PROVIDER_SITE_OTHER): Payer: Self-pay | Admitting: Internal Medicine

## 2024-02-10 VITALS — BP 144/87 | HR 67 | Temp 98.0°F | Ht 60.0 in | Wt 163.0 lb

## 2024-02-10 DIAGNOSIS — R7309 Other abnormal glucose: Secondary | ICD-10-CM

## 2024-02-10 DIAGNOSIS — E7800 Pure hypercholesterolemia, unspecified: Secondary | ICD-10-CM

## 2024-02-10 DIAGNOSIS — Z1331 Encounter for screening for depression: Secondary | ICD-10-CM

## 2024-02-10 DIAGNOSIS — D509 Iron deficiency anemia, unspecified: Secondary | ICD-10-CM

## 2024-02-10 DIAGNOSIS — R0602 Shortness of breath: Secondary | ICD-10-CM

## 2024-02-10 DIAGNOSIS — R5383 Other fatigue: Secondary | ICD-10-CM | POA: Diagnosis not present

## 2024-02-10 DIAGNOSIS — I10 Essential (primary) hypertension: Secondary | ICD-10-CM

## 2024-02-10 DIAGNOSIS — E66811 Obesity, class 1: Secondary | ICD-10-CM

## 2024-02-10 DIAGNOSIS — Z6831 Body mass index (BMI) 31.0-31.9, adult: Secondary | ICD-10-CM

## 2024-02-10 NOTE — Assessment & Plan Note (Signed)
 Patient reports that her blood pressure has been running higher lately medications are being adjusted by primary care team.  Based on age and cardiovascular risk blood pressure goal is less than 130/80.  Losing 10% of body weight may improve blood pressure control.

## 2024-02-10 NOTE — Assessment & Plan Note (Signed)
 LDL is not at goal. Elevated LDL may be secondary to nutrition, genetics and spillover effect from excess adiposity. Recommended LDL goal is <70 to reduce the risk of fatty streaks and the progression to obstructive ASCVD in the future.   Her 10 year risk is: The 10-year ASCVD risk score (Arnett DK, et al., 2019) is: 19.2%  Lab Results  Component Value Date   CHOL 203 (H) 11/04/2023   HDL 57.00 11/04/2023   LDLCALC 117 (H) 11/04/2023   LDLDIRECT 120.6 08/22/2011   TRIG 147.0 11/04/2023   CHOLHDL 4 11/04/2023    Continue weight loss therapy, losing 10% or more of body weight may improve condition. Also advised to reduce saturated fats in diet to less than 10% of daily calories.  She did not tolerate statins in the past this will be revisited as she has an increased cardiovascular risk.

## 2024-02-10 NOTE — Assessment & Plan Note (Signed)
 Detected on CBC from September of last year this is likely postoperative anemia.  We will check a CBC and iron studies to make sure levels are back to baseline and there is no underlying iron deficiency.

## 2024-02-10 NOTE — Assessment & Plan Note (Signed)
 To further assess cardiometabolic risk we will check fasting blood glucose and insulin  levels.  Most recent A1c was in the optimal range.

## 2024-02-10 NOTE — Progress Notes (Signed)
 [address] [place],  [place], [place] [address]  Office: [phone]  /  Fax: [phone]   Subjective   Initial Visit  Cheryl Barnes (MR# [id number]) is a 73 y.o. female who presents for evaluation and treatment of obesity and related comorbidities. Current BMI is Body mass index is 31.83 kg/m. Cheryl Barnes has been struggling with her weight for many years and has been unsuccessful in either losing weight, maintaining weight loss, or reaching her healthy weight goal.  Cheryl Barnes is currently in the action stage of change and ready to dedicate time achieving and maintaining a healthier weight. Cheryl Barnes is interested in becoming our patient and working on intensive lifestyle modifications including (but not limited to) diet and exercise for weight loss.  Weight history:  When asked how their weight has affected their life and health, she states: Contributed to medical problems, Contributed to orthopedic problems or mobility issues, Having fatigue, and Having poor endurance  When asked what else they would like to accomplish? She states: Improve energy levels and physical activity, Improve existing medical conditions, Reduce number of medications, and Improve quality of life  She starting to note weight gain during : adulthood.  Life events associated with weight gain include : medical illness.   Other contributing factors: family history of obesity, use of obesogenic medications: Antiepileptics, reduced physical acitivity, and menopause.  Their highest weight has been:  165 lbs.  Desired weight: 125  Previous weight-loss programs : [facility].  Their maximum weight loss was:  20 lbs.  Their greatest challenge with dieting: none.  Current or previous pharmacotherapy: None.  Response to medication: Never tried medications   Nutritional History:  Current nutrition plan: Portion control / smart choices.  How many times do you eat outside the home: 1-2 per week  How often do  they skip meals: does not skip meals  What beverages do they drink: water and unsweetened tea.   Use of artificial sweetners : No  Food intolerances or dislikes: none.  Food triggers: Seeking reward.  Food cravings: Sugary and Starches / Carbohydrates  Do they struggle with excessive hunger or portion control : Yes    Physical Activity:  Current level of physical activity: None and Limited due to chronic pain or orthopedic problems  Barriers to Exercise: orthopedic problems   Past medical history includes:   Past Medical History:  Diagnosis Date   Allergy    Arthritis    Back pain    Chest pain    Diverticulitis    High cholesterol    History of hiatal hernia    Hypertension    Joint pain    LBBB (left bundle branch block)    Migraine    history   Nonobstructive atherosclerosis of coronary artery    Osteoarthritis    Osteoarthritis, hip, bilateral      Objective   BP (!) 144/87   Pulse 67   Temp 98 F (36.7 C)   Ht 5' (1.524 m)   Wt 163 lb (73.9 kg)   SpO2 98%   BMI 31.83 kg/m  She was weighed on the bioimpedance scale: Body mass index is 31.83 kg/m.    Anthropometrics:  Vitals Temp: 98 F (36.7 C) BP: (!) 144/87 Pulse Rate: 67 SpO2: 98 %   Anthropometric Measurements Height: 5' (1.524 m) Weight: 163 lb (73.9 kg) BMI (Calculated): 31.83 Starting Weight: 163 lb Peak Weight: 165 lb Waist Measurement : 41 inches   Body Composition  Body Fat %: 45.2 %  Fat Mass (lbs): 74 lbs Muscle Mass (lbs): 85.2 lbs Total Body Water (lbs): 64.4 lbs Visceral Fat Rating : 13   Other Clinical Data RMR: 1325 Fasting: yes Labs: yes Today's Visit #: 1 Starting Date: 02/10/24    Physical Exam:  General: She is overweight, cooperative, alert, well developed, and in no acute distress. PSYCH: Has normal mood, affect and thought process.   HEENT: EOMI, sclerae are anicteric. Lungs: Normal breathing effort, no conversational dyspnea. Extremities: No  edema.  Neurologic: No gross sensory or motor deficits. No tremors or fasciculations noted.    Diagnostic Data Reviewed  EKG: Normal sinus rhythm, rate 70 bpm. No conduction abnormalities, abnormal Q waves or chamber enlargement.  Indirect Calorimeter completed today shows a VO2 of 192 and a REE of 1325.  Her calculated basal metabolic rate is 1610 thus her resting energy expenditure faster than calculated.  Depression Screen  Cheryl Barnes's PHQ-9 score was: 0.     02/10/2024    8:10 AM  Depression screen PHQ 2/9  Decreased Interest 0  Down, Depressed, Hopeless 0  PHQ - 2 Score 0  Altered sleeping 0  Tired, decreased energy 0  Change in appetite 0  Feeling bad or failure about yourself  0  Trouble concentrating 0  Moving slowly or fidgety/restless 0  Suicidal thoughts 0  PHQ-9 Score 0    Screening for Sleep Related Breathing Disorders  Cheryl Barnes denies daytime somnolence and denies waking up still tired. Patient denies  a history of OSA symptoms. Cheryl Barnes generally gets 8 or 9 hours of sleep per night, and states that she has generally restful sleep. Snoring is present. Apneic episodes are not present. Epworth Sleepiness Score is 2.   BMET    Component Value Date/Time   NA 143 11/04/2023 1053   NA 142 11/25/2020 1023   K 3.8 11/04/2023 1053   CL 108 11/04/2023 1053   CO2 26 11/04/2023 1053   GLUCOSE 102 (H) 11/04/2023 1053   BUN 17 11/04/2023 1053   BUN 17 11/25/2020 1023   CREATININE 0.71 11/04/2023 1053   CALCIUM  9.7 11/04/2023 1053   GFRNONAA >60 05/22/2023 0654   GFRAA >60 05/21/2017 1834   Lab Results  Component Value Date   HGBA1C 5.3 11/04/2023   HGBA1C 5.5 12/19/2014   No results found for: INSULIN  CBC    Component Value Date/Time   WBC 5.1 05/22/2023 0654   RBC 3.13 (L) 05/22/2023 0654   HGB 9.9 (L) 05/22/2023 0709   HCT 29.0 (L) 05/22/2023 0709   PLT 213 05/22/2023 0654   MCV 94.2 05/22/2023 0654   MCH 31.0 05/22/2023 0654   MCHC 32.9 05/22/2023  0654   RDW 13.7 05/22/2023 0654   Iron/TIBC/Ferritin/ %Sat No results found for: IRON, TIBC, FERRITIN, IRONPCTSAT Lipid Panel     Component Value Date/Time   CHOL 203 (H) 11/04/2023 1053   TRIG 147.0 11/04/2023 1053   HDL 57.00 11/04/2023 1053   CHOLHDL 4 11/04/2023 1053   VLDL 29.4 11/04/2023 1053   LDLCALC 117 (H) 11/04/2023 1053   LDLDIRECT 120.6 08/22/2011 0821   Hepatic Function Panel     Component Value Date/Time   PROT 7.7 11/04/2023 1053   ALBUMIN 4.8 11/04/2023 1053   AST 20 11/04/2023 1053   ALT 19 11/04/2023 1053   ALKPHOS 116 11/04/2023 1053   BILITOT 0.6 11/04/2023 1053   BILIDIR 0.1 08/29/2016 0928   IBILI NOT CALCULATED 09/30/2013 1427      Component Value Date/Time   TSH  0.58 10/24/2022 0839     Assessment and Plan   TREATMENT PLAN FOR OBESITY:  Recommended Dietary Goals  Cheryl Barnes is currently in the action stage of change. As such, her goal is to implement medically supervised weight loss plan.  She has agreed to implement: the Category 1 plan - 1000 kcal per day  Behavioral Intervention  We discussed the following Behavioral Modification Strategies today: increasing lean protein intake to established goals, decreasing simple carbohydrates , increasing vegetables, increasing lower glycemic fruits, increasing fiber rich foods, avoiding skipping meals, increasing water intake, work on meal planning and preparation, reading food labels , keeping healthy foods at home, identifying sources and decreasing liquid calories, decreasing eating out or consumption of processed foods, and making healthy choices when eating convenient foods, planning for success, and better snacking choices  Additional resources provided today: Handout on healthy eating and balanced plate, Handout on complex carbohydrates and lean sources of protein, and Category 1 packet  Recommended Physical Activity Goals  Cheryl Barnes has been advised to work up to 150 minutes of moderate  intensity aerobic activity a week and strengthening exercises 2-3 times per week for cardiovascular health, weight loss maintenance and preservation of muscle mass.   She has agreed to :  Think about enjoyable ways to increase daily physical activity and overcoming barriers to exercise and Increase physical activity in their day and reduce sedentary time (increase NEAT).  Pharmacotherapy We will work on building a Therapist, art and behavioral strategies. We will discuss the role of pharmacotherapy as an adjunct at subsequent visits.   ASSOCIATED CONDITIONS ADDRESSED TODAY  Other Fatigue Cheryl Barnes denies daytime somnolence and denies waking up still tired. Patient denies  a history of OSA symptoms. Shawntae generally gets 8 or 9 hours of sleep per night, and states that she has generally restful sleep. Snoring is present. Apneic episodes are not present. Epworth Sleepiness Score is 2. . Cheryl Barnes does feel that her weight is causing her energy to be lower than it should be. Fatigue may be related to obesity, depression or many other causes. Labs will be ordered, and in the meanwhile, Cheryl Barnes will focus on self care including making healthy food choices, increasing physical activity and focusing on stress reduction.  Shortness of Breath Cheryl Barnes notes increasing shortness of breath with exercising and seems to be worsening over time with weight gain. She notes getting out of breath sooner with activity than she used to. This has not gotten worse recently. Cheryl Barnes denies shortness of breath at rest or orthopnea.Cheryl Barnes notes increasing shortness of breath with exercising and seems to be worsening over time with weight gain. She notes getting out of breath sooner with activity than she used to. This has not gotten worse recently. Cheryl Barnes denies shortness of breath at rest or orthopnea.  Essential hypertension Assessment & Plan: Patient reports that her blood pressure has been running  higher lately medications are being adjusted by primary care team.  Based on age and cardiovascular risk blood pressure goal is less than 130/80.  Losing 10% of body weight may improve blood pressure control.    Pure hypercholesterolemia Assessment & Plan: LDL is not at goal. Elevated LDL may be secondary to nutrition, genetics and spillover effect from excess adiposity. Recommended LDL goal is <70 to reduce the risk of fatty streaks and the progression to obstructive ASCVD in the future.   Her 10 year risk is: The 10-year ASCVD risk score (Arnett DK, et al., 2019) is: 19.2%  Lab Results  Component Value Date   CHOL 203 (H) 11/04/2023   HDL 57.00 11/04/2023   LDLCALC 117 (H) 11/04/2023   LDLDIRECT 120.6 08/22/2011   TRIG 147.0 11/04/2023   CHOLHDL 4 11/04/2023    Continue weight loss therapy, losing 10% or more of body weight may improve condition. Also advised to reduce saturated fats in diet to less than 10% of daily calories.  She did not tolerate statins in the past this will be revisited as she has an increased cardiovascular risk.      Class 1 obesity with serious comorbidity and body mass index (BMI) of 31.0 to 31.9 in adult, unspecified obesity type Assessment & Plan: Contributing factors: Low volume of physical activity at present, menopause, presence of obesogenic medication (gabapentin ).  See obesity treatment plan  Orders: -     Insulin , random -     VITAMIN D  25 Hydroxy (Vit-D Deficiency, Fractures) -     Glucose, fasting  Other fatigue -     EKG 12-Lead  SOB (shortness of breath) on exertion  Depression screen  Iron deficiency anemia, unspecified iron deficiency anemia type Assessment & Plan: Detected on CBC from September of last year this is likely postoperative anemia.  We will check a CBC and iron studies to make sure levels are back to baseline and there is no underlying iron deficiency.  Orders: -     Vitamin B12 -     CBC with  Differential/Platelet -     Ferritin -     Iron and TIBC  Abnormal glucose Assessment & Plan: To further assess cardiometabolic risk we will check fasting blood glucose and insulin  levels.  Most recent A1c was in the optimal range.  Orders: -     Insulin , random -     Glucose, fasting    Follow-up  She was informed of the importance of frequent follow-up visits to maximize her success with intensive lifestyle modifications for her multiple health conditions. She was informed we would discuss her lab results at her next visit unless there is a critical issue that needs to be addressed sooner. Jerzee agreed to keep her next visit at the agreed upon time to discuss these results.  Attestation Statement  This is the patient's intake visit at [facility]. The patient's Health Questionnaire was reviewed at length. Included in the packet: current and past health history, medications, allergies, ROS, gynecologic history (women only), surgical history, family history, social history, weight history, weight loss surgery history (for those that have had weight loss surgery), nutritional evaluation, mood and food questionnaire, PHQ9, Epworth questionnaire, sleep habits questionnaire, patient life and health improvement goals questionnaire. These will all be scanned into the patient's chart under media.   During the visit, I independently reviewed the patient's EKG, previous labs, bioimpedance scale results, and indirect calorimetry results. I used this information to medically tailor a meal plan for the patient that will help her to lose weight and will improve her obesity-related conditions. I performed a medically necessary appropriate examination and/or evaluation. I discussed the assessment and treatment plan with the patient. The patient was provided an opportunity to ask questions and all were answered. The patient agreed with the plan and demonstrated an understanding of the  instructions. Labs were ordered at this visit and will be reviewed at the next visit unless critical results need to be addressed immediately. Clinical information was updated and documented in the EMR.   In addition, they received basic education on identification  of processed foods and reduction of these, different sources of lean proteins and complex carbohydrates and how to eat balanced by incorporation of whole foods.  Reviewed by clinician on day of visit: allergies, medications, problem list, medical history, surgical history, family history, social history, and previous encounter notes.  I have spent 60 minutes in the care of the patient today including: 5 minutes before the visit reviewing and preparing the chart. 43 minutes face-to-face assessing and reviewing listed medical problems as outlined in obesity care plan, providing nutritional and behavioral counseling on topics outlined in the obesity care plan, counseling regarding anti-obesity medication as outlined in obesity care plan, independently interpreting test results and goals of care, as described in assessment and plan, reviewing and discussing biometric information and progress, and ordering diagnostics - see orders 12 minutes after the visit updating chart and documentation of encounter.    Ladd Picker, MD

## 2024-02-10 NOTE — Assessment & Plan Note (Signed)
 Contributing factors: Low volume of physical activity at present, menopause, presence of obesogenic medication (gabapentin ).  See obesity treatment plan

## 2024-02-12 LAB — CBC WITH DIFFERENTIAL/PLATELET
Basophils Absolute: 0 10*3/uL (ref 0.0–0.2)
Basos: 0 %
EOS (ABSOLUTE): 0.1 10*3/uL (ref 0.0–0.4)
Eos: 2 %
Hematocrit: 45.7 % (ref 34.0–46.6)
Hemoglobin: 14.6 g/dL (ref 11.1–15.9)
Immature Grans (Abs): 0 10*3/uL (ref 0.0–0.1)
Immature Granulocytes: 0 %
Lymphocytes Absolute: 2.4 10*3/uL (ref 0.7–3.1)
Lymphs: 52 %
MCH: 30.1 pg (ref 26.6–33.0)
MCHC: 31.9 g/dL (ref 31.5–35.7)
MCV: 94 fL (ref 79–97)
Monocytes Absolute: 0.5 10*3/uL (ref 0.1–0.9)
Monocytes: 12 %
Neutrophils Absolute: 1.5 10*3/uL (ref 1.4–7.0)
Neutrophils: 34 %
Platelets: 171 10*3/uL (ref 150–450)
RBC: 4.85 x10E6/uL (ref 3.77–5.28)
RDW: 12.6 % (ref 11.7–15.4)
WBC: 4.5 10*3/uL (ref 3.4–10.8)

## 2024-02-12 LAB — IRON AND TIBC
Iron Saturation: 24 % (ref 15–55)
Iron: 78 ug/dL (ref 27–139)
Total Iron Binding Capacity: 320 ug/dL (ref 250–450)
UIBC: 242 ug/dL (ref 118–369)

## 2024-02-12 LAB — VITAMIN B12: Vitamin B-12: 319 pg/mL (ref 232–1245)

## 2024-02-12 LAB — FERRITIN: Ferritin: 141 ng/mL (ref 15–150)

## 2024-02-12 LAB — VITAMIN D 25 HYDROXY (VIT D DEFICIENCY, FRACTURES): Vit D, 25-Hydroxy: 32.6 ng/mL (ref 30.0–100.0)

## 2024-02-12 LAB — INSULIN, RANDOM: INSULIN: 17.2 u[IU]/mL (ref 2.6–24.9)

## 2024-02-12 LAB — GLUCOSE, FASTING: Glucose, Plasma: 83 mg/dL (ref 70–99)

## 2024-02-24 ENCOUNTER — Ambulatory Visit (INDEPENDENT_AMBULATORY_CARE_PROVIDER_SITE_OTHER): Admitting: Internal Medicine

## 2024-03-01 ENCOUNTER — Other Ambulatory Visit: Payer: Self-pay | Admitting: Orthopaedic Surgery

## 2024-03-02 ENCOUNTER — Other Ambulatory Visit: Payer: Self-pay | Admitting: Family Medicine

## 2024-03-02 ENCOUNTER — Ambulatory Visit (INDEPENDENT_AMBULATORY_CARE_PROVIDER_SITE_OTHER): Admitting: Family Medicine

## 2024-03-02 ENCOUNTER — Encounter: Payer: Self-pay | Admitting: Family Medicine

## 2024-03-02 VITALS — BP 128/80 | HR 79 | Temp 98.1°F | Resp 16 | Ht 60.0 in | Wt 166.0 lb

## 2024-03-02 DIAGNOSIS — R040 Epistaxis: Secondary | ICD-10-CM

## 2024-03-02 DIAGNOSIS — I10 Essential (primary) hypertension: Secondary | ICD-10-CM

## 2024-03-02 MED ORDER — AMLODIPINE-OLMESARTAN 5-40 MG PO TABS: 1.0000 | ORAL_TABLET | Freq: Every day | ORAL | 1 refills | Status: DC

## 2024-03-02 NOTE — Progress Notes (Signed)
 HPI: [name] is a 73 y.o. female with a PMHx significant for HTN, HLD, LBBB, diverticulosis, aortic atherosclerosis, and thyroid  nodule, who is here today for HTN follow-up.  Last seen on 12/30/2023  Hypertension:  Medications: Amlodipine  5 mg daily and Losartan  100 mg daily - Losartan  increased from 50 mg.  BP readings at home: 130s/80s reportedly, occasional SBP's 150's Side effects: none Exercise: chair exercises Diet: balanced with protein and vegetables Negative for unusual or severe headache, visual changes, exertional chest pain, dyspnea,  focal weakness, or worsening edema.  BP Readings from Last 3 Encounters:  03/02/24 128/80  02/10/24 (!) 144/87  12/30/23 (!) 155/85   Lab Results  Component Value Date   CREATININE 0.71 11/04/2023   BUN 17 11/04/2023   NA 143 11/04/2023   K 3.8 11/04/2023   CL 108 11/04/2023   CO2 26 11/04/2023   Notes she recently had a nosebleed and lately has noticed more bruising, dorsum os hands and forearms. Negative for night sweats, gross hematuria,melena,or blood in stool.  Lab Results  Component Value Date   WBC 4.5 02/10/2024   HGB 14.6 02/10/2024   HCT 45.7 02/10/2024   MCV 94 02/10/2024   PLT 171 02/10/2024   Review of Systems  Constitutional:  Negative for activity change, appetite change and fever.  HENT:  Negative for sore throat and trouble swallowing.        (+) Nosebleed  Respiratory:  Negative for cough and wheezing.   Gastrointestinal:  Negative for abdominal pain, nausea and vomiting.  Genitourinary:  Negative for decreased urine volume and dysuria.  Skin:  Negative for rash.       (+) Easy bruising  Allergic/Immunologic: Positive for environmental allergies.  Neurological:  Negative for syncope and facial asymmetry.  Psychiatric/Behavioral:  Negative for confusion and hallucinations.   See other pertinent positives and negatives in HPI.  Current Outpatient Medications on File Prior to Visit   Medication Sig Dispense Refill   cetirizine (ZYRTEC) 10 MG tablet Take 10 mg by mouth every evening.     docusate sodium  (COLACE) 100 MG capsule Take 1 capsule (100 mg total) by mouth daily as needed. 30 capsule 2   gabapentin  (NEURONTIN ) 100 MG capsule Take 1 capsule (100 mg total) by mouth 3 (three) times daily as needed. 60 capsule 2   Homeopathic Products (CLEAR TINNITUS) CAPS Take 1 capsule by mouth daily after supper. Silencil Max for Tinnitus     melatonin 5 MG TABS Take 5 mg by mouth at bedtime as needed (sleep).     tiZANidine  (ZANAFLEX ) 4 MG tablet Take 1 tablet (4 mg total) by mouth every 8 (eight) hours as needed for muscle spasms. 30 tablet 3   valACYclovir  (VALTREX ) 1000 MG tablet Take 2 tablets by mouth as needed upon acute onset and asap repeat dose in 12 hours 16 tablet 2   No current facility-administered medications on file prior to visit.   Past Medical History:  Diagnosis Date   Allergy    Arthritis    Back pain    Chest pain    Diverticulitis    High cholesterol    History of hiatal hernia    Hypertension    Joint pain    LBBB (left bundle branch block)    Migraine    history   Nonobstructive atherosclerosis of coronary artery    Osteoarthritis    Osteoarthritis, hip, bilateral    Allergies  Allergen Reactions   Penicillins Itching and Rash  Has patient had a PCN reaction causing immediate rash, facial/tongue/throat swelling, SOB or lightheadedness with hypotension: Yes Has patient had a PCN reaction causing severe rash involving mucus membranes or skin necrosis: No Has patient had a PCN reaction that required hospitalization No Has patient had a PCN reaction occurring within the last 10 years: No If all of the above answers are NO, then may proceed with Cephalosporin use.    Latex Itching and Rash   Seasonal Ic [Cholestatin] Other (See Comments)    Upper resp, sore throat, sneezing, etc.   Tape Other (See Comments)    Tape causes some redness &  causes BLISTERS if left on for too long    Social History   Socioeconomic History   Marital status: Divorced    Spouse name: Not on file   Number of children: 2   Years of education: Not on file   Highest education level: Not on file  Occupational History   Occupation: ophthalmology @ [employer]    Employer: [employer]   Occupation: retired - Ophthalmology  Tobacco Use   Smoking status: Never   Smokeless tobacco: Never  Vaping Use   Vaping status: Never Used  Substance and Sexual Activity   Alcohol use: No   Drug use: No   Sexual activity: Never  Other Topics Concern   Not on file  Social History Narrative   Not on file   Social Drivers of Health   Financial Resource Strain: Not on file  Food Insecurity: Low Risk  (12/03/2023)   Received from [facility]   Hunger Vital Sign    Within the past 12 months, you worried that your food would run out before you got money to buy more: Never true    Within the past 12 months, the food you bought just didn't last and you didn't have money to get more. : Never true  Transportation Needs: No Transportation Needs (12/03/2023)   Received from [facility]    In the past 12 months, has lack of reliable transportation kept you from medical appointments, meetings, work or from getting things needed for daily living? : No  Physical Activity: Not on file  Stress: Not on file  Social Connections: Not on file    Vitals:   03/02/24 1311  BP: 128/80  Pulse: 79  Resp: 16  Temp: 98.1 F (36.7 C)  SpO2: 95%   Body mass index is 32.42 kg/m.  Physical Exam Vitals and nursing note reviewed.  Constitutional:      General: She is not in acute distress.    Appearance: She is well-developed.  HENT:     Head: Normocephalic and atraumatic.     Nose:     Right Nostril: No epistaxis.  Eyes:     Conjunctiva/sclera: Conjunctivae normal.  Cardiovascular:     Rate and Rhythm: Normal rate and regular rhythm.     Pulses:           Dorsalis pedis pulses are 2+ on the right side and 2+ on the left side.     Heart sounds: No murmur heard. Pulmonary:     Effort: Pulmonary effort is normal. No respiratory distress.     Breath sounds: Normal breath sounds.  Abdominal:     Palpations: Abdomen is soft. There is no mass.     Tenderness: There is no abdominal tenderness.  Musculoskeletal:     Right lower leg: No edema.     Left lower leg:  No edema.  Skin:    General: Skin is warm.     Findings: No erythema or rash.  Neurological:     General: No focal deficit present.     Mental Status: She is alert and oriented to person, place, and time.     Gait: Gait normal.  Psychiatric:        Mood and Affect: Mood and affect normal.    ASSESSMENT AND PLAN: [name] was seen here today for blood pressure follow-up.  Essential (primary) hypertension Assessment & Plan: BP has improved, still having some BP readings mildly elevated at home. She agrees with changing losartan  for olmesartan . Recommend Amlodipine -olmesartan  5-40 mg daily. Continue monitoring BP and instructed to let me know how her readings in about 3 weeks. Follow-up in 6 months, before if needed.  Orders: -     amLODIPine -Olmesartan ; Take 1 tablet by mouth daily.  Dispense: 30 tablet; Refill: 1  Epistaxis A single episode. We discussed possible etiologies, history does not suggest a serious process,?  Allergy rhinitis, nasal mucosal dryness. Monitor for new symptoms. If problem becomes more frequent, we could consider ENT evaluation.  I spent a total of 31 minutes in both face to face and non face to face activities for this visit on the date of this encounter. During this time history was obtained and documented, examination was performed, prior labs reviewed, and assessment/plan discussed.  Return in about 6 months (around 09/02/2024) for chronic problems.  I,Emily Lagle,acting as a Neurosurgeon for Saige Canton [place], MD.,have documented all  relevant documentation on the behalf of Tahari Clabaugh [place], MD,as directed by  Dagoberto Nealy [place], MD while in the presence of Darean Rote [place], MD.  I, Marranda Arakelian [place], MD, have reviewed all documentation for this visit. The documentation on 03/02/24 for the exam, diagnosis, procedures, and orders are all accurate and complete. Ona Rathert G. [place], MD  [facility]. [place] office.

## 2024-03-02 NOTE — Patient Instructions (Addendum)
 A few things to remember from today's visit:  Essential hypertension  Epistaxis  Monitor for recurrent nose bleed. Today we changed to combination tab with Amlodipine  and Olmesartan  for blood pressure. Blood pressure readings in 3 weeks.  If you need refills for medications you take chronically, please call your pharmacy. Do not use My Chart to request refills or for acute issues that need immediate attention. If you send a my chart message, it may take a few days to be addressed, specially if I am not in the office.  Please be sure medication list is accurate. If a new problem present, please set up appointment sooner than planned today.

## 2024-03-02 NOTE — Assessment & Plan Note (Signed)
 BP has improved, still having some BP readings mildly elevated at home. She agrees with changing losartan  for olmesartan . Recommend Amlodipine -olmesartan  5-40 mg daily. Continue monitoring BP and instructed to let me know how her readings in about 3 weeks. Follow-up in 6 months, before if needed.

## 2024-03-03 ENCOUNTER — Encounter (INDEPENDENT_AMBULATORY_CARE_PROVIDER_SITE_OTHER): Payer: Self-pay | Admitting: Internal Medicine

## 2024-03-03 ENCOUNTER — Ambulatory Visit (INDEPENDENT_AMBULATORY_CARE_PROVIDER_SITE_OTHER): Admitting: Internal Medicine

## 2024-03-03 VITALS — BP 138/82 | HR 64 | Temp 97.8°F | Ht 60.0 in | Wt 159.0 lb

## 2024-03-03 DIAGNOSIS — Z9189 Other specified personal risk factors, not elsewhere classified: Secondary | ICD-10-CM | POA: Insufficient documentation

## 2024-03-03 DIAGNOSIS — I10 Essential (primary) hypertension: Secondary | ICD-10-CM | POA: Diagnosis not present

## 2024-03-03 DIAGNOSIS — E88819 Insulin resistance, unspecified: Secondary | ICD-10-CM | POA: Insufficient documentation

## 2024-03-03 DIAGNOSIS — E7800 Pure hypercholesterolemia, unspecified: Secondary | ICD-10-CM | POA: Diagnosis not present

## 2024-03-03 DIAGNOSIS — E66811 Obesity, class 1: Secondary | ICD-10-CM

## 2024-03-03 DIAGNOSIS — Z6831 Body mass index (BMI) 31.0-31.9, adult: Secondary | ICD-10-CM

## 2024-03-03 NOTE — Telephone Encounter (Signed)
Should have 3 refills

## 2024-03-03 NOTE — Progress Notes (Signed)
 The 10-year ASCVD risk score (Arnett DK, et al., 2019) is: 17.8%   Office: [phone]  /  Fax: [phone]  Weight Summary and Body Composition Analysis (BIA)  Vitals Temp: 97.8 F (36.6 C) BP: 138/82 Pulse Rate: 64 SpO2: 97 %   Anthropometric Measurements Height: 5' (1.524 m) Weight: 159 lb (72.1 kg) BMI (Calculated): 31.05 Weight at Last Visit: 163 lb Weight Lost Since Last Visit: 4 lb Weight Gained Since Last Visit: 0 lb Starting Weight: 163 lb Total Weight Loss (lbs): 4 lb (1.814 kg) Peak Weight: 165 lb   Body Composition  Body Fat %: 43.4 % Fat Mass (lbs): 69.2 lbs Muscle Mass (lbs): 85.8 lbs Total Body Water (lbs): 60.8 lbs Visceral Fat Rating : 13    RMR: 1325  Today's Visit #: 2  Starting Date: 02/10/24   Subjective   Chief Complaint: Obesity  Interval History Discussed the use of AI scribe software for clinical note transcription with the patient, who gave verbal consent to proceed.  History of Present Illness   Cheryl Barnes is a 73 year old female who presents for a follow-up on medical weight management.  She is adhering to a 1000-calorie reduced nutrition plan and has successfully lost four pounds since her last visit. She tracks her caloric intake, consumes more whole foods, and ensures she does not skip meals. Her exercise regimen includes strength and cardio workouts five days a week for 15 to 20 minutes. She reports adequate sleep and no high levels of stress.  She occasionally experiences hunger, which has disrupted her sleep, prompting her to consume small snacks such as almonds or pretzels. Her body composition has improved, with a reduction in body fat percentage from 46% to 43%.  Her past medical history includes prediabetes and insulin  resistance, with a fasting blood sugar of 102 in March and a normal A1c. Recent tests show a fasting blood sugar of 83 and an A1c of 5.3. Her insulin  level is 17.2. She has a history of not  tolerating cholesterol medications due to side effects, with current LDL levels at 117. Her iron levels are normal, with an iron level of 78 and ferritin of 141. She no longer has anemia, and her vitamin D  and B12 levels are normal.  Her family history includes her father passing from colon cancer. She had a recent colonoscopy with no significant findings, though a small area was removed as a precaution. She is up to date with her mammograms and has an upcoming bone density test.  She denies ever smoking and is not eligible for lung cancer screening.       Challenges affecting patient progress: none.    Pharmacotherapy for weight management: She is currently taking no anti-obesity medication.   Assessment and Plan   Treatment Plan For Obesity:  Recommended Dietary Goals  Nataly is currently in the action stage of change. As such, her goal is to continue weight management plan. She has agreed to: continue current plan  Behavioral Health and Counseling  We discussed the following behavioral modification strategies today: increasing lean protein intake to established goals, increasing fiber rich foods, increasing water intake , and continue to practice mindfulness when eating.  Additional education and resources provided today: Handout on increasing daily activity and exercise goal setting  Recommended Physical Activity Goals  Lundynn has been advised to work up to 150 minutes of moderate intensity aerobic activity a week and strengthening exercises 2-3 times per week for cardiovascular health, weight loss maintenance  and preservation of muscle mass.   She has agreed to :  continue to gradually increase the amount and intensity of exercise routine  Medical Interventions and Pharmacotherapy  We discussed various medication options to help Tocara with her weight loss efforts and we both agreed to : Continue with current nutritional and behavioral strategies  Associated Conditions  Impacted by Obesity Treatment  Assessment & Plan Essential (primary) hypertension  Class 1 obesity with serious comorbidity and body mass index (BMI) of 31.0 to 31.9 in adult, unspecified obesity type She is on a medical weight management program with a 1000-calorie reduced nutrition plan, resulting in a 4-pound weight loss since the last visit. She adheres well to the plan, tracking calories and consuming more whole foods. She exercises five days a week for 15-20 minutes, focusing on strength and cardio. Occasional hunger, especially at night, affects her sleep. Her body fat percentage decreased from 46% to 43%, with a goal of under 36%. - Increase intake of protein and fiber-rich foods to manage hunger. - Encourage consumption of at least 30 grams of protein per meal to increase GLP-1 levels and enhance satiety. - Advise on increasing intake of fruits and vegetables to enhance satiety and provide fiber. - Allow flexibility in calorie intake to ensure comfortable satiety without focusing strictly on calorie count. - Consider use of protein shakes or meal replacements if needed to meet protein goals. - Practice mindful eating to ensure eating until comfortably full. Pure hypercholesterolemia LDL cholesterol is 117, above the target of 70 or less for reducing cardiovascular risk. She is intolerant to statins due to side effects. Cardiovascular risk is calculated at 17.8%, considered high risk. Ezetimibe (Zetia) is considered as an alternative to statins due to its lower risk of muscle aches. - Discuss with primary care provider the possibility of starting ezetimibe (Zetia) to lower LDL cholesterol. - Monitor blood pressure and aim for a target of under 130/80.  Her blood pressure medications have been recently adjusted - Emphasize the importance of managing cardiovascular risk factors to prevent heart attacks and strokes. Insulin  resistance Her HOMA-IR is 3.77 which is elevated. Optimal level <  1.9.  She has a normal A1c and fasting blood glucose  This is complex condition associated with genetics, ectopic fat and lifestyle factors. Insulin  resistance may result in increased fat storage, inhibition of the breakdown of fat, cause fluctuations in blood sugar leading to energy crashes and increased cravings for sugary or high carb foods and cause metabolic slowdown making it difficult to lose weight.  This may result in additional weight gain and lead to pre-diabetes and diabetes if untreated. In addition, hyperinsulinemia increases cardiovascular risk, chronic inflammatory response and may increase the risk of obesity related malignancies.  Lab Results  Component Value Date   HGBA1C 5.3 11/04/2023   Lab Results  Component Value Date   INSULIN  17.2 02/10/2024   Lab Results  Component Value Date   GLUCOSE 102 (H) 11/04/2023   GLUCOSE 97 03/14/2007    We reviewed treatment options which include losing 7 to 10% of body weight, increasing volume of physical activity and maintaining a diet low in saturated fats and with a low glycemic load.  Patient has also been educated on the carb insulin  model of obesity.  She may also be a candidate for pharmacoprophylaxis with metformin and / or GLP1 medication.   At increased risk for cardiovascular disease LDL cholesterol is 117, above the target of 70 or less for reducing cardiovascular risk.  She is intolerant to statins due to side effects. Cardiovascular risk is calculated at 17.8%, considered high risk. Ezetimibe (Zetia) is considered as an alternative to statins due to its lower risk of muscle aches. - Discuss with primary care provider the possibility of starting ezetimibe (Zetia) to lower LDL cholesterol. - Monitor blood pressure and aim for a target of under 130/80.  Her blood pressure medications have been recently adjusted - Emphasize the importance of managing cardiovascular risk factors to prevent heart attacks and strokes.      General Health Maintenance She is up to date with mammogram and colonoscopy screenings. A bone density test is scheduled. No history of smoking, so lung cancer screening is not indicated. Cervical cancer screening is not required after age 24. - Continue annual mammograms. - Repeat colonoscopy in 5 years due to family history of colon cancer. - Proceed with scheduled bone density test.  Follow-up She is at the beginning of the weight management program and requires close monitoring to ensure adherence and address any issues. - Schedule follow-up appointment in 3 weeks to monitor progress and adjust the plan as needed.        Objective   Physical Exam:  Blood pressure 138/82, pulse 64, temperature 97.8 F (36.6 C), height 5' (1.524 m), weight 159 lb (72.1 kg), SpO2 97%. Body mass index is 31.05 kg/m.  General: She is overweight, cooperative, alert, well developed, and in no acute distress. PSYCH: Has normal mood, affect and thought process.   HEENT: EOMI, sclerae are anicteric. Lungs: Normal breathing effort, no conversational dyspnea. Extremities: No edema.  Neurologic: No gross sensory or motor deficits. No tremors or fasciculations noted.    Diagnostic Data Reviewed:  BMET    Component Value Date/Time   NA 143 11/04/2023 1053   NA 142 11/25/2020 1023   K 3.8 11/04/2023 1053   CL 108 11/04/2023 1053   CO2 26 11/04/2023 1053   GLUCOSE 102 (H) 11/04/2023 1053   BUN 17 11/04/2023 1053   BUN 17 11/25/2020 1023   CREATININE 0.71 11/04/2023 1053   CALCIUM  9.7 11/04/2023 1053   GFRNONAA >60 05/22/2023 0654   GFRAA >60 05/21/2017 1834   Lab Results  Component Value Date   HGBA1C 5.3 11/04/2023   HGBA1C 5.5 12/19/2014   Lab Results  Component Value Date   INSULIN  17.2 02/10/2024   Lab Results  Component Value Date   TSH 0.58 10/24/2022   CBC    Component Value Date/Time   WBC 4.5 02/10/2024 0935   WBC 5.1 05/22/2023 0654   RBC 4.85 02/10/2024 0935   RBC  3.13 (L) 05/22/2023 0654   HGB 14.6 02/10/2024 0935   HCT 45.7 02/10/2024 0935   PLT 171 02/10/2024 0935   MCV 94 02/10/2024 0935   MCH 30.1 02/10/2024 0935   MCH 31.0 05/22/2023 0654   MCHC 31.9 02/10/2024 0935   MCHC 32.9 05/22/2023 0654   RDW 12.6 02/10/2024 0935   Iron Studies    Component Value Date/Time   IRON 78 02/10/2024 0935   TIBC 320 02/10/2024 0935   FERRITIN 141 02/10/2024 0935   IRONPCTSAT 24 02/10/2024 0935   Lipid Panel     Component Value Date/Time   CHOL 203 (H) 11/04/2023 1053   TRIG 147.0 11/04/2023 1053   HDL 57.00 11/04/2023 1053   CHOLHDL 4 11/04/2023 1053   VLDL 29.4 11/04/2023 1053   LDLCALC 117 (H) 11/04/2023 1053   LDLDIRECT 120.6 08/22/2011 0821   Hepatic Function Panel  Component Value Date/Time   PROT 7.7 11/04/2023 1053   ALBUMIN 4.8 11/04/2023 1053   AST 20 11/04/2023 1053   ALT 19 11/04/2023 1053   ALKPHOS 116 11/04/2023 1053   BILITOT 0.6 11/04/2023 1053   BILIDIR 0.1 08/29/2016 0928   IBILI NOT CALCULATED 09/30/2013 1427      Component Value Date/Time   TSH 0.58 10/24/2022 0839   Nutritional Lab Results  Component Value Date   VD25OH 32.6 02/10/2024    Medications: Outpatient Encounter Medications as of 03/03/2024  Medication Sig Note   amLODipine -olmesartan  (AZOR ) 5-40 MG tablet TAKE 1 TABLET BY MOUTH DAILY    cetirizine (ZYRTEC) 10 MG tablet Take 10 mg by mouth every evening.    docusate sodium  (COLACE) 100 MG capsule Take 1 capsule (100 mg total) by mouth daily as needed.    gabapentin  (NEURONTIN ) 100 MG capsule Take 1 capsule (100 mg total) by mouth 3 (three) times daily as needed.    Homeopathic Products (CLEAR TINNITUS) CAPS Take 1 capsule by mouth daily after supper. Silencil Max for Tinnitus    melatonin 5 MG TABS Take 5 mg by mouth at bedtime as needed (sleep).    tiZANidine  (ZANAFLEX ) 4 MG tablet Take 1 tablet (4 mg total) by mouth every 8 (eight) hours as needed for muscle spasms.    valACYclovir  (VALTREX )  1000 MG tablet Take 2 tablets by mouth as needed upon acute onset and asap repeat dose in 12 hours 05/22/2023: Patient states that pharmacy was told that she could not take this medication with her Oxycodone . Patient would like to know if this is a contradiction.   No facility-administered encounter medications on file as of 03/03/2024.     Follow-Up   No follow-ups on file.SABRA She was informed of the importance of frequent follow up visits to maximize her success with intensive lifestyle modifications for her multiple health conditions.  Attestation Statement   Reviewed by clinician on day of visit: allergies, medications, problem list, medical history, surgical history, family history, social history, and previous encounter notes.   I have spent 45 minutes in the care of the patient today including: 3 minutes before the visit reviewing and preparing the chart. 35 minutes face-to-face assessing and reviewing listed medical problems as outlined in obesity care plan, providing nutritional and behavioral counseling on topics outlined in the obesity care plan, care coordination with other providers : Dr. Betty [place], independently interpreting test results and goals of care, as described in assessment and plan, and reviewing and discussing biometric information and progress 7 minutes after the visit updating chart and documentation of encounter.    Lucas Parker, MD

## 2024-03-03 NOTE — Telephone Encounter (Signed)
 Called patient. No answer. LMOM that script should have 3 refills on it.

## 2024-03-03 NOTE — Assessment & Plan Note (Signed)
 She is on a medical weight management program with a 1000-calorie reduced nutrition plan, resulting in a 4-pound weight loss since the last visit. She adheres well to the plan, tracking calories and consuming more whole foods. She exercises five days a week for 15-20 minutes, focusing on strength and cardio. Occasional hunger, especially at night, affects her sleep. Her body fat percentage decreased from 46% to 43%, with a goal of under 36%. - Increase intake of protein and fiber-rich foods to manage hunger. - Encourage consumption of at least 30 grams of protein per meal to increase GLP-1 levels and enhance satiety. - Advise on increasing intake of fruits and vegetables to enhance satiety and provide fiber. - Allow flexibility in calorie intake to ensure comfortable satiety without focusing strictly on calorie count. - Consider use of protein shakes or meal replacements if needed to meet protein goals. - Practice mindful eating to ensure eating until comfortably full.

## 2024-03-03 NOTE — Assessment & Plan Note (Signed)
 LDL cholesterol is 117, above the target of 70 or less for reducing cardiovascular risk. She is intolerant to statins due to side effects. Cardiovascular risk is calculated at 17.8%, considered high risk. Ezetimibe (Zetia) is considered as an alternative to statins due to its lower risk of muscle aches. - Discuss with primary care provider the possibility of starting ezetimibe (Zetia) to lower LDL cholesterol. - Monitor blood pressure and aim for a target of under 130/80.  Her blood pressure medications have been recently adjusted - Emphasize the importance of managing cardiovascular risk factors to prevent heart attacks and strokes.

## 2024-03-03 NOTE — Assessment & Plan Note (Signed)
 Her HOMA-IR is 3.77 which is elevated. Optimal level < 1.9.  She has a normal A1c and fasting blood glucose  This is complex condition associated with genetics, ectopic fat and lifestyle factors. Insulin  resistance may result in increased fat storage, inhibition of the breakdown of fat, cause fluctuations in blood sugar leading to energy crashes and increased cravings for sugary or high carb foods and cause metabolic slowdown making it difficult to lose weight.  This may result in additional weight gain and lead to pre-diabetes and diabetes if untreated. In addition, hyperinsulinemia increases cardiovascular risk, chronic inflammatory response and may increase the risk of obesity related malignancies.  Lab Results  Component Value Date   HGBA1C 5.3 11/04/2023   Lab Results  Component Value Date   INSULIN  17.2 02/10/2024   Lab Results  Component Value Date   GLUCOSE 102 (H) 11/04/2023   GLUCOSE 97 03/14/2007    We reviewed treatment options which include losing 7 to 10% of body weight, increasing volume of physical activity and maintaining a diet low in saturated fats and with a low glycemic load.  Patient has also been educated on the carb insulin  model of obesity.  She may also be a candidate for pharmacoprophylaxis with metformin and / or GLP1 medication.

## 2024-03-20 ENCOUNTER — Other Ambulatory Visit: Payer: Self-pay | Admitting: Family Medicine

## 2024-03-20 DIAGNOSIS — I10 Essential (primary) hypertension: Secondary | ICD-10-CM

## 2024-04-06 ENCOUNTER — Encounter (INDEPENDENT_AMBULATORY_CARE_PROVIDER_SITE_OTHER): Payer: Self-pay | Admitting: Internal Medicine

## 2024-04-06 ENCOUNTER — Ambulatory Visit (INDEPENDENT_AMBULATORY_CARE_PROVIDER_SITE_OTHER): Admitting: Internal Medicine

## 2024-04-06 ENCOUNTER — Other Ambulatory Visit (INDEPENDENT_AMBULATORY_CARE_PROVIDER_SITE_OTHER): Payer: Self-pay | Admitting: Internal Medicine

## 2024-04-06 ENCOUNTER — Telehealth: Payer: Self-pay

## 2024-04-06 VITALS — BP 136/84 | HR 84 | Ht 60.0 in | Wt 158.0 lb

## 2024-04-06 DIAGNOSIS — E66811 Obesity, class 1: Secondary | ICD-10-CM | POA: Diagnosis not present

## 2024-04-06 DIAGNOSIS — E88819 Insulin resistance, unspecified: Secondary | ICD-10-CM

## 2024-04-06 DIAGNOSIS — Z6831 Body mass index (BMI) 31.0-31.9, adult: Secondary | ICD-10-CM | POA: Diagnosis not present

## 2024-04-06 DIAGNOSIS — I10 Essential (primary) hypertension: Secondary | ICD-10-CM | POA: Diagnosis not present

## 2024-04-06 MED ORDER — METFORMIN HCL ER 500 MG PO TB24: 500.0000 mg | ORAL_TABLET | Freq: Every day | ORAL | 0 refills | Status: DC

## 2024-04-06 NOTE — Telephone Encounter (Signed)
 Copied from CRM [id number]. Topic: Clinical - Prescription Issue >> Apr 06, 2024  3:30 PM Thersia BROCKS wrote: Reason for CRM: amLODipine -olmesartan  (AZOR ) 5-40 MG tablet patient stated she is starting to have side effect, couhing congestion, cold like symptoms , sore throat. Patient stated she read that that is one of the side effects of medication

## 2024-04-06 NOTE — Progress Notes (Signed)
 Office: [phone]  /  Fax: [phone]  Weight Summary and Body Composition Analysis (BIA)  Vitals BP: 136/84 Pulse Rate: 84 SpO2: 98 %   Anthropometric Measurements Height: 5' (1.524 m) Weight: 158 lb (71.7 kg) BMI (Calculated): 30.86 Weight at Last Visit: 159l lb Weight Lost Since Last Visit: 1 lb Weight Gained Since Last Visit: 0 lb Starting Weight: 163 lb Total Weight Loss (lbs): 5 lb (2.268 kg) Peak Weight: 165 lb   Body Composition  Body Fat %: 44.5 % Fat Mass (lbs): 70.4 lbs Muscle Mass (lbs): 83.4 lbs Total Body Water (lbs): 61 lbs Visceral Fat Rating : 13    RMR: 1325  Today's Visit #: 3  Starting Date: 02/10/24   Subjective   Chief Complaint: Obesity  Interval History Discussed the use of AI scribe software for clinical note transcription with the patient, who gave verbal consent to proceed.  History of Present Illness   Cheryl Barnes is a 73 year old female with hypertension, insulin  resistance, and hypercholesterolemia who presents for medical weight management.  She follows a 1000 calorie nutrition plan with good adherence, resulting in a total weight loss of five pounds since the last office visit. She tracks her food intake, focuses on eating more whole foods, and does not skip meals. She reports adequate sleep but is not currently exercising. She is disappointed with her weight loss progress.  She notes improvement in appetite control, stating that her hunger has leveled out and is much better. She acknowledges occasional insufficient protein intake and inadequate water consumption, citing frequent urination as a challenge.  There has been a recent change in her blood pressure medication to Azor , a combination of amlodipine  and losartan . Since the change, she has experienced a cough, sore throat, and congestion.  She inquires about medication options for insulin  resistance.  She discusses her cholesterol management, noting previous  issues with statins and considering Zetia as an alternative.       Challenges affecting patient progress: Gradual weight loss for perceived effort.    Pharmacotherapy for weight management: She is currently taking no anti-obesity medication and patient has declined pharmacotherapy in past.   Assessment and Plan   Treatment Plan For Obesity:  Recommended Dietary Goals  [name] is currently in the action stage of change. As such, her goal is to continue weight management plan. She has agreed to: incorporate 1-2 meal replacements a day for convenience , continue current plan, and incorporate time restricted eating 16 hours fasting with an 8 hour eating window while maintaining reduced calorie nutrition plan  Behavioral Health and Counseling  We discussed the following behavioral modification strategies today: continue to work on maintaining a reduced calorie state, getting the recommended amount of protein, incorporating whole foods, making healthy choices, staying well hydrated and practicing mindfulness when eating..  Additional education and resources provided today: None  Recommended Physical Activity Goals  [name] has been advised to work up to 150 minutes of moderate intensity aerobic activity a week and strengthening exercises 2-3 times per week for cardiovascular health, weight loss maintenance and preservation of muscle mass.   She has agreed to :  Think about enjoyable ways to increase daily physical activity and overcoming barriers to exercise and Increase physical activity in their day and reduce sedentary time (increase NEAT).  Medical Interventions and Pharmacotherapy  We discussed various medication options to help Myrah with her weight loss efforts and we both agreed to : Start metformin  XR 500 mg 1 tablet  daily for pharmacoprophylaxis  Associated Conditions Impacted by Obesity Treatment  Assessment & Plan Insulin  resistance Her HOMA-IR is 3.77 which is  elevated. Optimal level < 1.9.  She has a normal A1c and fasting blood glucose  This is complex condition associated with genetics, ectopic fat and lifestyle factors. Insulin  resistance may result in increased fat storage, inhibition of the breakdown of fat, cause fluctuations in blood sugar leading to energy crashes and increased cravings for sugary or high carb foods and cause metabolic slowdown making it difficult to lose weight.  This may result in additional weight gain and lead to pre-diabetes and diabetes if untreated. In addition, hyperinsulinemia increases cardiovascular risk, chronic inflammatory response and may increase the risk of obesity related malignancies.  Lab Results  Component Value Date   HGBA1C 5.3 11/04/2023   Lab Results  Component Value Date   INSULIN  17.2 02/10/2024   Lab Results  Component Value Date   GLUCOSE 102 (H) 11/04/2023   GLUCOSE 97 03/14/2007    We reviewed treatment options which include losing 7 to 10% of body weight, increasing volume of physical activity and maintaining a diet low in saturated fats and with a low glycemic load.  Patient has also been educated on the carb insulin  model of obesity.  After discussion of benefits and side effects he is agreeable to starting metformin  XR 500 mg once a day for pharmacoprophylaxis  Essential (primary) hypertension Patient reports experiencing some increased congestion and cough she thinks is related to blood pressure medication I tried to explain to her that it is unlikely sounds like she may have postnasal drainage.  Her blood pressure is close to goal on current regimen but she will follow-up with Dr. [place] to review her concern with blood pressure medications.  Class 1 obesity with serious comorbidity and body mass index (BMI) of 31.0 to 31.9 in adult, unspecified obesity type Patient has lost 5 pounds but she is dissatisfied with the rate of weight loss.  We had discussed previously how to manage  expectations that weight loss at her age is a gradual process.  She also has limited treatment options when it comes to antiobesity medications.  And physical activity levels are also low.  She is agreeable to looking at [facility] well as she is interested to begin to exercise.  She is also interested in starting metformin  for insulin  resistance.  This will also help with her HDL cholesterol.      Objective   Physical Exam:  Blood pressure 136/84, pulse 84, height 5' (1.524 m), weight 158 lb (71.7 kg), SpO2 98%. Body mass index is 30.86 kg/m.  General: She is overweight, cooperative, alert, well developed, and in no acute distress. PSYCH: Has normal mood, affect and thought process.   HEENT: EOMI, sclerae are anicteric. Lungs: Normal breathing effort, no conversational dyspnea. Extremities: No edema.  Neurologic: No gross sensory or motor deficits. No tremors or fasciculations noted.    Diagnostic Data Reviewed:  BMET    Component Value Date/Time   NA 143 11/04/2023 1053   NA 142 11/25/2020 1023   K 3.8 11/04/2023 1053   CL 108 11/04/2023 1053   CO2 26 11/04/2023 1053   GLUCOSE 102 (H) 11/04/2023 1053   BUN 17 11/04/2023 1053   BUN 17 11/25/2020 1023   CREATININE 0.71 11/04/2023 1053   CALCIUM  9.7 11/04/2023 1053   GFRNONAA >60 05/22/2023 0654   GFRAA >60 05/21/2017 1834   Lab Results  Component Value Date  HGBA1C 5.3 11/04/2023   HGBA1C 5.5 12/19/2014   Lab Results  Component Value Date   INSULIN  17.2 02/10/2024   Lab Results  Component Value Date   TSH 0.58 10/24/2022   CBC    Component Value Date/Time   WBC 4.5 02/10/2024 0935   WBC 5.1 05/22/2023 0654   RBC 4.85 02/10/2024 0935   RBC 3.13 (L) 05/22/2023 0654   HGB 14.6 02/10/2024 0935   HCT 45.7 02/10/2024 0935   PLT 171 02/10/2024 0935   MCV 94 02/10/2024 0935   MCH 30.1 02/10/2024 0935   MCH 31.0 05/22/2023 0654   MCHC 31.9 02/10/2024 0935   MCHC 32.9 05/22/2023 0654   RDW 12.6 02/10/2024 0935    Iron Studies    Component Value Date/Time   IRON 78 02/10/2024 0935   TIBC 320 02/10/2024 0935   FERRITIN 141 02/10/2024 0935   IRONPCTSAT 24 02/10/2024 0935   Lipid Panel     Component Value Date/Time   CHOL 203 (H) 11/04/2023 1053   TRIG 147.0 11/04/2023 1053   HDL 57.00 11/04/2023 1053   CHOLHDL 4 11/04/2023 1053   VLDL 29.4 11/04/2023 1053   LDLCALC 117 (H) 11/04/2023 1053   LDLDIRECT 120.6 08/22/2011 0821   Hepatic Function Panel     Component Value Date/Time   PROT 7.7 11/04/2023 1053   ALBUMIN 4.8 11/04/2023 1053   AST 20 11/04/2023 1053   ALT 19 11/04/2023 1053   ALKPHOS 116 11/04/2023 1053   BILITOT 0.6 11/04/2023 1053   BILIDIR 0.1 08/29/2016 0928   IBILI NOT CALCULATED 09/30/2013 1427      Component Value Date/Time   TSH 0.58 10/24/2022 0839   Nutritional Lab Results  Component Value Date   VD25OH 32.6 02/10/2024    Medications: Outpatient Encounter Medications as of 04/06/2024  Medication Sig Note   amLODipine -olmesartan  (AZOR ) 5-40 MG tablet TAKE 1 TABLET BY MOUTH DAILY    cetirizine (ZYRTEC) 10 MG tablet Take 10 mg by mouth every evening.    gabapentin  (NEURONTIN ) 100 MG capsule Take 1 capsule (100 mg total) by mouth 3 (three) times daily as needed.    melatonin 5 MG TABS Take 5 mg by mouth at bedtime as needed (sleep).    metFORMIN  (GLUCOPHAGE -XR) 500 MG 24 hr tablet Take 1 tablet (500 mg total) by mouth daily with breakfast.    tiZANidine  (ZANAFLEX ) 4 MG tablet Take 1 tablet (4 mg total) by mouth every 8 (eight) hours as needed for muscle spasms.    valACYclovir  (VALTREX ) 1000 MG tablet Take 2 tablets by mouth as needed upon acute onset and asap repeat dose in 12 hours 05/22/2023: Patient states that pharmacy was told that she could not take this medication with her Oxycodone . Patient would like to know if this is a contradiction.   No facility-administered encounter medications on file as of 04/06/2024.     Follow-Up   Return in about 4 weeks  (around 05/04/2024) for For Weight Mangement with Dr. Francyne.SABRA She was informed of the importance of frequent follow up visits to maximize her success with intensive lifestyle modifications for her multiple health conditions.  Attestation Statement   Reviewed by clinician on day of visit: allergies, medications, problem list, medical history, surgical history, family history, social history, and previous encounter notes.     Lucas Francyne, MD

## 2024-04-06 NOTE — Assessment & Plan Note (Signed)
 Her HOMA-IR is 3.77 which is elevated. Optimal level < 1.9.  She has a normal A1c and fasting blood glucose  This is complex condition associated with genetics, ectopic fat and lifestyle factors. Insulin  resistance may result in increased fat storage, inhibition of the breakdown of fat, cause fluctuations in blood sugar leading to energy crashes and increased cravings for sugary or high carb foods and cause metabolic slowdown making it difficult to lose weight.  This may result in additional weight gain and lead to pre-diabetes and diabetes if untreated. In addition, hyperinsulinemia increases cardiovascular risk, chronic inflammatory response and may increase the risk of obesity related malignancies.  Lab Results  Component Value Date   HGBA1C 5.3 11/04/2023   Lab Results  Component Value Date   INSULIN  17.2 02/10/2024   Lab Results  Component Value Date   GLUCOSE 102 (H) 11/04/2023   GLUCOSE 97 03/14/2007    We reviewed treatment options which include losing 7 to 10% of body weight, increasing volume of physical activity and maintaining a diet low in saturated fats and with a low glycemic load.  Patient has also been educated on the carb insulin  model of obesity.  After discussion of benefits and side effects he is agreeable to starting metformin  XR 500 mg once a day for pharmacoprophylaxis

## 2024-04-06 NOTE — Assessment & Plan Note (Signed)
 Patient has lost 5 pounds but she is dissatisfied with the rate of weight loss.  We had discussed previously how to manage expectations that weight loss at her age is a gradual process.  She also has limited treatment options when it comes to antiobesity medications.  And physical activity levels are also low.  She is agreeable to looking at [facility] well as she is interested to begin to exercise.  She is also interested in starting metformin  for insulin  resistance.  This will also help with her HDL cholesterol.

## 2024-04-06 NOTE — Assessment & Plan Note (Signed)
 Patient reports experiencing some increased congestion and cough she thinks is related to blood pressure medication I tried to explain to her that it is unlikely sounds like she may have postnasal drainage.  Her blood pressure is close to goal on current regimen but she will follow-up with Dr. [place] to review her concern with blood pressure medications.

## 2024-04-07 ENCOUNTER — Other Ambulatory Visit: Payer: Self-pay | Admitting: Family Medicine

## 2024-04-07 DIAGNOSIS — I10 Essential (primary) hypertension: Secondary | ICD-10-CM

## 2024-04-07 NOTE — Telephone Encounter (Signed)
 I called and spoke with patient. She is aware of information below; she will continue the medication and let us  know in 10 days if she is still having symptoms.

## 2024-04-09 ENCOUNTER — Other Ambulatory Visit: Payer: Self-pay | Admitting: Family Medicine

## 2024-04-10 ENCOUNTER — Other Ambulatory Visit: Payer: Self-pay

## 2024-04-10 ENCOUNTER — Telehealth: Payer: Self-pay | Admitting: Orthopaedic Surgery

## 2024-04-10 DIAGNOSIS — Z96641 Presence of right artificial hip joint: Secondary | ICD-10-CM

## 2024-04-10 NOTE — Telephone Encounter (Signed)
 Patient called and said she wants to do PT over at [facility]. CB#[phone]

## 2024-04-10 NOTE — Telephone Encounter (Signed)
 Referral placed.

## 2024-04-14 ENCOUNTER — Telehealth: Payer: Self-pay

## 2024-04-14 DIAGNOSIS — I10 Essential (primary) hypertension: Secondary | ICD-10-CM

## 2024-04-14 MED ORDER — AMLODIPINE-OLMESARTAN 5-40 MG PO TABS: 1.0000 | ORAL_TABLET | Freq: Every day | ORAL | 2 refills | Status: AC

## 2024-04-14 NOTE — Telephone Encounter (Signed)
 Copied from CRM #[id number]. Topic: Clinical - Prescription Issue >> Apr 14, 2024  4:20 PM Cheryl Barnes wrote: Reason for CRM: Patients  amLODipine -olmesartan  (AZOR ) 5-40 MG tablet was to be sent to   [facility] - [place], [place] - [address] but it was sent to [employer] [facility]

## 2024-04-16 NOTE — Telephone Encounter (Signed)
 Patient is calling in requesting that the prescription be resubmitted by doctor electronically because the home delivery company states something went wrong and its it redone.

## 2024-04-23 ENCOUNTER — Ambulatory Visit (HOSPITAL_BASED_OUTPATIENT_CLINIC_OR_DEPARTMENT_OTHER)
Admission: RE | Admit: 2024-04-23 | Discharge: 2024-04-23 | Disposition: A | Discharge status: Home / Self Care | Source: Ambulatory Visit | Attending: Family Medicine | Admitting: Family Medicine

## 2024-04-23 DIAGNOSIS — Z780 Asymptomatic menopausal state: Secondary | ICD-10-CM | POA: Diagnosis present

## 2024-04-24 ENCOUNTER — Other Ambulatory Visit: Payer: Medicare Other

## 2024-05-02 ENCOUNTER — Other Ambulatory Visit (INDEPENDENT_AMBULATORY_CARE_PROVIDER_SITE_OTHER): Payer: Self-pay | Admitting: Internal Medicine

## 2024-05-02 DIAGNOSIS — E88819 Insulin resistance, unspecified: Secondary | ICD-10-CM

## 2024-05-11 ENCOUNTER — Ambulatory Visit (INDEPENDENT_AMBULATORY_CARE_PROVIDER_SITE_OTHER): Admitting: Internal Medicine

## 2024-05-11 ENCOUNTER — Encounter (INDEPENDENT_AMBULATORY_CARE_PROVIDER_SITE_OTHER): Payer: Self-pay | Admitting: Internal Medicine

## 2024-05-11 VITALS — BP 136/85 | HR 76 | Temp 98.0°F | Ht 60.0 in | Wt 158.0 lb

## 2024-05-11 DIAGNOSIS — E66811 Obesity, class 1: Secondary | ICD-10-CM | POA: Diagnosis not present

## 2024-05-11 DIAGNOSIS — E88819 Insulin resistance, unspecified: Secondary | ICD-10-CM

## 2024-05-11 DIAGNOSIS — Z6831 Body mass index (BMI) 31.0-31.9, adult: Secondary | ICD-10-CM

## 2024-05-11 DIAGNOSIS — E7800 Pure hypercholesterolemia, unspecified: Secondary | ICD-10-CM

## 2024-05-11 DIAGNOSIS — I10 Essential (primary) hypertension: Secondary | ICD-10-CM

## 2024-05-11 MED ORDER — METFORMIN HCL ER 500 MG PO TB24: 500.0000 mg | ORAL_TABLET | Freq: Every day | ORAL | 0 refills | Status: DC

## 2024-05-11 NOTE — Progress Notes (Signed)
 Office: [phone]  /  Fax: [phone]  Weight Summary And Body Composition Analysis (BIA)  Vitals Temp: 98 F (36.7 C) BP: 136/85 Pulse Rate: 76 SpO2: 96 %   Anthropometric Measurements Height: 5' (1.524 m) Weight: 158 lb (71.7 kg) BMI (Calculated): 30.86 Weight at Last Visit: 158 lb Weight Lost Since Last Visit: 0 lb Weight Gained Since Last Visit: 0 lb Starting Weight: 163 lb Total Weight Loss (lbs): 5 lb (2.268 kg) Peak Weight: 165   Body Composition  Body Fat %: 43.9 % Fat Mass (lbs): 69.4 lbs Muscle Mass (lbs): 84 lbs Total Body Water (lbs): 61 lbs Visceral Fat Rating : 13    RMR: 125  Today's Visit #: 4  Starting Date: 02/09/24   Subjective   Chief Complaint: Obesity  Cheryl Barnes is here to discuss her progress with her obesity treatment plan. She is following the Category 1 plan - 1000 kcal per day and states she is following her eating plan approximately 70-80% of the time. She states she is exercising 30 minutes 4 times per week..  Weight Progress Since Last Visit:  Since last office visit she has maintained weight. She reports good adherence to reduced calorie nutritional plan. She has been working on reading food labels, not skipping meals, increasing protein intake at every meal, drinking more water, making healthier choices, reducing portion sizes, and incorporating more whole foods   Nutritional 24 HR Recall: Intake consistent with prescribed nutritional plan  Challenges affecting patient progress: none.   Orexigenic Control: Denies problems with appetite and hunger signals.  Denies problems with satiety and satiation.  Denies problems with eating patterns and portion control.  Denies abnormal cravings. Denies feeling deprived or restricted.   Pharmacotherapy for weight management: She is currently taking Metformin  (off label use for weight management and / or insulin  resistance and / or diabetes prevention) with adequate clinical  response  and without side effects..   Assessment and Plan   Treatment Plan For Obesity:  Recommended Dietary Goals  Cheryl Barnes is currently in the action stage of change. As such, her goal is to continue weight management plan. She has agreed to: continue current plan  Behavioral Health and Counseling  We discussed the following behavioral modification strategies today: continue to work on maintaining a reduced calorie state, getting the recommended amount of protein, incorporating whole foods, making healthy choices, staying well hydrated and practicing mindfulness when eating. and increase protein intake, fibrous foods (25 grams per day for women, 30 grams for men) and water to improve satiety and decrease hunger signals. .  Additional education and resources provided today: None  Recommended Physical Activity Goals  Cheryl Barnes has been advised to work up to 150 minutes of moderate intensity aerobic activity a week and strengthening exercises 2-3 times per week for cardiovascular health, weight loss maintenance and preservation of muscle mass.   She has agreed to :  Think about enjoyable ways to increase daily physical activity and overcoming barriers to exercise, Increase physical activity in their day and reduce sedentary time (increase NEAT)., Increase volume of physical activity to a goal of 240 minutes a week, and Combine aerobic and strengthening exercises for efficiency and improved cardiometabolic health.  Pharmacotherapy and Medical Interventions  Adequate clinical response to anti-obesity medication, continue current regimen  Associated Conditions Impacted by Obesity Treatment  Assessment & Plan Insulin  resistance Her HOMA-IR is 3.77 which is elevated. Optimal level < 1.9.  She has a normal A1c and fasting blood glucose  This  is complex condition associated with genetics, ectopic fat and lifestyle factors. Insulin  resistance may result in increased fat storage, inhibition of  the breakdown of fat, cause fluctuations in blood sugar leading to energy crashes and increased cravings for sugary or high carb foods and cause metabolic slowdown making it difficult to lose weight.  This may result in additional weight gain and lead to pre-diabetes and diabetes if untreated. In addition, hyperinsulinemia increases cardiovascular risk, chronic inflammatory response and may increase the risk of obesity related malignancies.  Lab Results  Component Value Date   HGBA1C 5.3 11/04/2023   Lab Results  Component Value Date   INSULIN  17.2 02/10/2024   Lab Results  Component Value Date   GLUCOSE 102 (H) 11/04/2023   GLUCOSE 97 03/14/2007    We reviewed treatment options which include losing 7 to 10% of body weight, increasing volume of physical activity and maintaining a diet low in saturated fats and with a low glycemic load.  Patient has also been educated on the carb insulin  model of obesity.  After discussion of benefits and side effects he is agreeable to starting metformin  XR 500 mg once a day for pharmacoprophylaxis  Essential (primary) hypertension Blood pressure is well-controlled for age and risk category.  She is currently on amlodipine  and olmesartan  without any adverse effects we will continue current regiment.  Class 1 obesity with serious comorbidity and body mass index (BMI) of 31.0 to 31.9 in adult, unspecified obesity type Continue reduced calorie nutrition plan targeting 1000 cal She may incorporate 1 meal replacement per day She will try intermittent fasting 18:6 She will be starting exercise at [facility] well. Pure hypercholesterolemia LDL cholesterol is 117, above the target of 70 or less for reducing cardiovascular risk. She is intolerant to statins due to side effects. Cardiovascular risk is calculated at 17.8%, considered high risk. Ezetimibe (Zetia) is considered as an alternative to statins due to its lower risk of muscle aches. - Discuss with primary  care provider the possibility of starting ezetimibe (Zetia) to lower LDL cholesterol. - Monitor blood pressure and aim for a target of under 130/80.  Her blood pressure medications have been recently adjusted - Emphasize the importance of managing cardiovascular risk factors to prevent heart attacks and strokes.    Objective   Physical Exam:  Blood pressure 136/85, pulse 76, temperature 98 F (36.7 C), height 5' (1.524 m), weight 158 lb (71.7 kg), SpO2 96%. Body mass index is 30.86 kg/m.  General: She is overweight, cooperative, alert, well developed, and in no acute distress. PSYCH: Has normal mood, affect and thought process.   HEENT: EOMI, sclerae are anicteric. Lungs: Normal breathing effort, no conversational dyspnea. Extremities: No edema.  Neurologic: No gross sensory or motor deficits. No tremors or fasciculations noted.    Diagnostic Data Reviewed:  BMET    Component Value Date/Time   NA 143 11/04/2023 1053   NA 142 11/25/2020 1023   K 3.8 11/04/2023 1053   CL 108 11/04/2023 1053   CO2 26 11/04/2023 1053   GLUCOSE 102 (H) 11/04/2023 1053   BUN 17 11/04/2023 1053   BUN 17 11/25/2020 1023   CREATININE 0.71 11/04/2023 1053   CALCIUM  9.7 11/04/2023 1053   GFRNONAA >60 05/22/2023 0654   GFRAA >60 05/21/2017 1834   Lab Results  Component Value Date   HGBA1C 5.3 11/04/2023   HGBA1C 5.5 12/19/2014   Lab Results  Component Value Date   INSULIN  17.2 02/10/2024   Lab Results  Component  Value Date   TSH 0.58 10/24/2022   CBC    Component Value Date/Time   WBC 4.5 02/10/2024 0935   WBC 5.1 05/22/2023 0654   RBC 4.85 02/10/2024 0935   RBC 3.13 (L) 05/22/2023 0654   HGB 14.6 02/10/2024 0935   HCT 45.7 02/10/2024 0935   PLT 171 02/10/2024 0935   MCV 94 02/10/2024 0935   MCH 30.1 02/10/2024 0935   MCH 31.0 05/22/2023 0654   MCHC 31.9 02/10/2024 0935   MCHC 32.9 05/22/2023 0654   RDW 12.6 02/10/2024 0935   Iron Studies    Component Value Date/Time   IRON  78 02/10/2024 0935   TIBC 320 02/10/2024 0935   FERRITIN 141 02/10/2024 0935   IRONPCTSAT 24 02/10/2024 0935   Lipid Panel     Component Value Date/Time   CHOL 203 (H) 11/04/2023 1053   TRIG 147.0 11/04/2023 1053   HDL 57.00 11/04/2023 1053   CHOLHDL 4 11/04/2023 1053   VLDL 29.4 11/04/2023 1053   LDLCALC 117 (H) 11/04/2023 1053   LDLDIRECT 120.6 08/22/2011 0821   Hepatic Function Panel     Component Value Date/Time   PROT 7.7 11/04/2023 1053   ALBUMIN 4.8 11/04/2023 1053   AST 20 11/04/2023 1053   ALT 19 11/04/2023 1053   ALKPHOS 116 11/04/2023 1053   BILITOT 0.6 11/04/2023 1053   BILIDIR 0.1 08/29/2016 0928   IBILI NOT CALCULATED 09/30/2013 1427      Component Value Date/Time   TSH 0.58 10/24/2022 0839   Nutritional Lab Results  Component Value Date   VD25OH 32.6 02/10/2024    Medications: Outpatient Encounter Medications as of 05/11/2024  Medication Sig Note   amLODipine -olmesartan  (AZOR ) 5-40 MG tablet Take 1 tablet by mouth daily.    cetirizine (ZYRTEC) 10 MG tablet Take 10 mg by mouth every evening.    gabapentin  (NEURONTIN ) 100 MG capsule Take 1 capsule (100 mg total) by mouth 3 (three) times daily as needed.    melatonin 5 MG TABS Take 5 mg by mouth at bedtime as needed (sleep).    tiZANidine  (ZANAFLEX ) 4 MG tablet Take 1 tablet (4 mg total) by mouth every 8 (eight) hours as needed for muscle spasms.    valACYclovir  (VALTREX ) 1000 MG tablet Take 2 tablets by mouth as needed upon acute onset and asap repeat dose in 12 hours 05/22/2023: Patient states that pharmacy was told that she could not take this medication with her Oxycodone . Patient would like to know if this is a contradiction.   [DISCONTINUED] metFORMIN  (GLUCOPHAGE -XR) 500 MG 24 hr tablet TAKE 1 TABLET(500 MG) BY MOUTH DAILY WITH BREAKFAST    metFORMIN  (GLUCOPHAGE -XR) 500 MG 24 hr tablet Take 1 tablet (500 mg total) by mouth daily with breakfast.    No facility-administered encounter medications on file  as of 05/11/2024.     Follow-Up   No follow-ups on file.SABRA She was informed of the importance of frequent follow up visits to maximize her success with intensive lifestyle modifications for her multiple health conditions.  Attestation Statement   Reviewed by clinician on day of visit: allergies, medications, problem list, medical history, surgical history, family history, social history, and previous encounter notes.     Lucas Parker, MD

## 2024-05-11 NOTE — Assessment & Plan Note (Signed)
 Continue reduced calorie nutrition plan targeting 1000 cal She may incorporate 1 meal replacement per day She will try intermittent fasting 18:6 She will be starting exercise at [facility] well.

## 2024-05-11 NOTE — Assessment & Plan Note (Signed)
 Blood pressure is well-controlled for age and risk category.  She is currently on amlodipine  and olmesartan  without any adverse effects we will continue current regiment.

## 2024-05-11 NOTE — Assessment & Plan Note (Signed)
 LDL cholesterol is 117, above the target of 70 or less for reducing cardiovascular risk. She is intolerant to statins due to side effects. Cardiovascular risk is calculated at 17.8%, considered high risk. Ezetimibe (Zetia) is considered as an alternative to statins due to its lower risk of muscle aches. - Discuss with primary care provider the possibility of starting ezetimibe (Zetia) to lower LDL cholesterol. - Monitor blood pressure and aim for a target of under 130/80.  Her blood pressure medications have been recently adjusted - Emphasize the importance of managing cardiovascular risk factors to prevent heart attacks and strokes.

## 2024-05-11 NOTE — Assessment & Plan Note (Signed)
 Her HOMA-IR is 3.77 which is elevated. Optimal level < 1.9.  She has a normal A1c and fasting blood glucose  This is complex condition associated with genetics, ectopic fat and lifestyle factors. Insulin  resistance may result in increased fat storage, inhibition of the breakdown of fat, cause fluctuations in blood sugar leading to energy crashes and increased cravings for sugary or high carb foods and cause metabolic slowdown making it difficult to lose weight.  This may result in additional weight gain and lead to pre-diabetes and diabetes if untreated. In addition, hyperinsulinemia increases cardiovascular risk, chronic inflammatory response and may increase the risk of obesity related malignancies.  Lab Results  Component Value Date   HGBA1C 5.3 11/04/2023   Lab Results  Component Value Date   INSULIN  17.2 02/10/2024   Lab Results  Component Value Date   GLUCOSE 102 (H) 11/04/2023   GLUCOSE 97 03/14/2007    We reviewed treatment options which include losing 7 to 10% of body weight, increasing volume of physical activity and maintaining a diet low in saturated fats and with a low glycemic load.  Patient has also been educated on the carb insulin  model of obesity.  After discussion of benefits and side effects he is agreeable to starting metformin  XR 500 mg once a day for pharmacoprophylaxis

## 2024-05-11 NOTE — Progress Notes (Deleted)
 Office: [phone]  /  Fax: [phone]  Weight Summary and Body Composition Analysis (BIA)  Vitals Temp: 98 F (36.7 C) BP: 136/85 Pulse Rate: 76 SpO2: 96 %   Anthropometric Measurements Height: 5' (1.524 m) Weight: 158 lb (71.7 kg) BMI (Calculated): 30.86 Weight at Last Visit: 158 lb Weight Lost Since Last Visit: 0 lb Weight Gained Since Last Visit: 0 lb Starting Weight: 163 lb Total Weight Loss (lbs): 5 lb (2.268 kg) Peak Weight: 165   Body Composition  Body Fat %: 43.9 % Fat Mass (lbs): 69.4 lbs Muscle Mass (lbs): 84 lbs Total Body Water (lbs): 61 lbs Visceral Fat Rating : 13    RMR: 125  Today's Visit #: 4  Starting Date: 02/09/24   Subjective   Chief Complaint: Obesity  Interval History Discussed the use of AI scribe software for clinical note transcription with the patient, who gave verbal consent to proceed.  History of Present Illness      Challenges affecting patient progress: {EMOBESITYBARRIERS:28841::none}.    Pharmacotherapy for weight management: She is currently taking {EMPharmaco:28845}.   Assessment and Plan   Treatment Plan For Obesity:  Recommended Dietary Goals  Jean is currently in the action stage of change. As such, her goal is to continue weight management plan. She has agreed to: {EMWTLOSSPLAN:29297::continue current plan}  Behavioral Health and Counseling  We discussed the following behavioral modification strategies today: {EMWMwtlossstrategies:28914::continue to work on maintaining a reduced calorie state, getting the recommended amount of protein, incorporating whole foods, making healthy choices, staying well hydrated and practicing mindfulness when eating.,increase protein intake, fibrous foods (25 grams per day for women, 30 grams for men) and water to improve satiety and decrease hunger signals. }.  Additional education and resources provided  today: {EMadditionalresources:29169::None}  Recommended Physical Activity Goals  Blyss has been advised to work up to 150 minutes of moderate intensity aerobic activity a week and strengthening exercises 2-3 times per week for cardiovascular health, weight loss maintenance and preservation of muscle mass.  She has agreed to :  {EMEXERCISE:28847::Think about enjoyable ways to increase daily physical activity and overcoming barriers to exercise,Increase physical activity in their day and reduce sedentary time (increase NEAT).,Increase volume of physical activity to a goal of 240 minutes a week,Combine aerobic and strengthening exercises for efficiency and improved cardiometabolic health.}  Medical Interventions and Pharmacotherapy  We discussed various medication options to help Annise with her weight loss efforts and we both agreed to : {EMagreedrx:29170}  Associated Conditions Impacted by Obesity Treatment  Assessment & Plan Insulin  resistance     Assessment and Plan Assessment & Plan       Objective   Physical Exam:  Blood pressure 136/85, pulse 76, temperature 98 F (36.7 C), height 5' (1.524 m), weight 158 lb (71.7 kg), SpO2 96%. Body mass index is 30.86 kg/m.  General: She is overweight, cooperative, alert, well developed, and in no acute distress. PSYCH: Has normal mood, affect and thought process.   HEENT: EOMI, sclerae are anicteric. Lungs: Normal breathing effort, no conversational dyspnea. Extremities: No edema.  Neurologic: No gross sensory or motor deficits. No tremors or fasciculations noted.    Diagnostic Data Reviewed:  BMET    Component Value Date/Time   NA 143 11/04/2023 1053   NA 142 11/25/2020 1023   K 3.8 11/04/2023 1053   CL 108 11/04/2023 1053   CO2 26 11/04/2023 1053   GLUCOSE 102 (H) 11/04/2023 1053   BUN 17 11/04/2023 1053   BUN 17 11/25/2020  1023   CREATININE 0.71 11/04/2023 1053   CALCIUM  9.7 11/04/2023 1053   GFRNONAA  >60 05/22/2023 0654   GFRAA >60 05/21/2017 1834   Lab Results  Component Value Date   HGBA1C 5.3 11/04/2023   HGBA1C 5.5 12/19/2014   Lab Results  Component Value Date   INSULIN  17.2 02/10/2024   Lab Results  Component Value Date   TSH 0.58 10/24/2022   CBC    Component Value Date/Time   WBC 4.5 02/10/2024 0935   WBC 5.1 05/22/2023 0654   RBC 4.85 02/10/2024 0935   RBC 3.13 (L) 05/22/2023 0654   HGB 14.6 02/10/2024 0935   HCT 45.7 02/10/2024 0935   PLT 171 02/10/2024 0935   MCV 94 02/10/2024 0935   MCH 30.1 02/10/2024 0935   MCH 31.0 05/22/2023 0654   MCHC 31.9 02/10/2024 0935   MCHC 32.9 05/22/2023 0654   RDW 12.6 02/10/2024 0935   Iron Studies    Component Value Date/Time   IRON 78 02/10/2024 0935   TIBC 320 02/10/2024 0935   FERRITIN 141 02/10/2024 0935   IRONPCTSAT 24 02/10/2024 0935   Lipid Panel     Component Value Date/Time   CHOL 203 (H) 11/04/2023 1053   TRIG 147.0 11/04/2023 1053   HDL 57.00 11/04/2023 1053   CHOLHDL 4 11/04/2023 1053   VLDL 29.4 11/04/2023 1053   LDLCALC 117 (H) 11/04/2023 1053   LDLDIRECT 120.6 08/22/2011 0821   Hepatic Function Panel     Component Value Date/Time   PROT 7.7 11/04/2023 1053   ALBUMIN 4.8 11/04/2023 1053   AST 20 11/04/2023 1053   ALT 19 11/04/2023 1053   ALKPHOS 116 11/04/2023 1053   BILITOT 0.6 11/04/2023 1053   BILIDIR 0.1 08/29/2016 0928   IBILI NOT CALCULATED 09/30/2013 1427      Component Value Date/Time   TSH 0.58 10/24/2022 0839   Nutritional Lab Results  Component Value Date   VD25OH 32.6 02/10/2024    Medications: Outpatient Encounter Medications as of 05/11/2024  Medication Sig Note   amLODipine -olmesartan  (AZOR ) 5-40 MG tablet Take 1 tablet by mouth daily.    cetirizine (ZYRTEC) 10 MG tablet Take 10 mg by mouth every evening.    gabapentin  (NEURONTIN ) 100 MG capsule Take 1 capsule (100 mg total) by mouth 3 (three) times daily as needed.    melatonin 5 MG TABS Take 5 mg by mouth at  bedtime as needed (sleep).    metFORMIN  (GLUCOPHAGE -XR) 500 MG 24 hr tablet TAKE 1 TABLET(500 MG) BY MOUTH DAILY WITH BREAKFAST    tiZANidine  (ZANAFLEX ) 4 MG tablet Take 1 tablet (4 mg total) by mouth every 8 (eight) hours as needed for muscle spasms.    valACYclovir  (VALTREX ) 1000 MG tablet Take 2 tablets by mouth as needed upon acute onset and asap repeat dose in 12 hours 05/22/2023: Patient states that pharmacy was told that she could not take this medication with her Oxycodone . Patient would like to know if this is a contradiction.   No facility-administered encounter medications on file as of 05/11/2024.     Follow-Up   No follow-ups on file.SABRA She was informed of the importance of frequent follow up visits to maximize her success with intensive lifestyle modifications for her multiple health conditions.  Attestation Statement   Reviewed by clinician on day of visit: allergies, medications, problem list, medical history, surgical history, family history, social history, and previous encounter notes.     Lucas Parker, MD

## 2024-05-16 ENCOUNTER — Ambulatory Visit: Payer: Self-pay | Admitting: Family Medicine

## 2024-05-16 DIAGNOSIS — M816 Localized osteoporosis [Lequesne]: Secondary | ICD-10-CM

## 2024-05-16 DIAGNOSIS — M810 Age-related osteoporosis without current pathological fracture: Secondary | ICD-10-CM | POA: Insufficient documentation

## 2024-05-19 NOTE — Telephone Encounter (Signed)
 Pt called back returning call. Please call pt when available.

## 2024-05-24 NOTE — Therapy (Signed)
 OUTPATIENT PHYSICAL THERAPY LOWER EXTREMITY EVALUATION   Patient Name: Cheryl Barnes MRN: [id number] DOB:Sep 29, 1950, 73 y.o., female Today's Date: 05/26/2024  END OF SESSION:  PT End of Session - 05/26/24 0724     Visit Number 1    Number of Visits 12    Date for Recertification  07/06/24    Authorization Type MCR A and B    PT Start Time 1116    PT Stop Time 1200    PT Time Calculation (min) 44 min    Activity Tolerance Patient tolerated treatment well    Behavior During Therapy WFL for tasks assessed/performed          Past Medical History:  Diagnosis Date   Allergy    Arthritis    Back pain    Chest pain    Diverticulitis    High cholesterol    History of hiatal hernia    Hypertension    Joint pain    LBBB (left bundle branch block)    Migraine    history   Nonobstructive atherosclerosis of coronary artery    Osteoarthritis    Osteoarthritis, hip, bilateral    Past Surgical History:  Procedure Laterality Date   CESAREAN SECTION     x2   CHOLECYSTECTOMY     PARTIAL HYSTERECTOMY     Abdominal   ROTATOR CUFF REPAIR Left    ROTATOR CUFF REPAIR Right    TONSILLECTOMY     removed as a child   TOTAL HIP ARTHROPLASTY Right 05/13/2023   Procedure: RIGHT TOTAL HIP REPLACEMENT;  Surgeon: Jerri Kay HERO, MD;  Location: [facility] OR;  Service: Orthopedics;  Laterality: Right;  3-C   TYMPANOSTOMY TUBE PLACEMENT Right 2022   Patient Active Problem List   Diagnosis Date Noted   Osteoporosis 05/16/2024   Insulin  resistance 03/03/2024   At increased risk for cardiovascular disease 03/03/2024   Iron deficiency anemia 02/10/2024   Abnormal glucose 02/10/2024   Statin intolerance - tried several in past, 2-3 years ago 12/16/2023   Tinnitus of both ears 11/04/2023   Status post total replacement of right hip 05/13/2023   Primary osteoarthritis of right hip 05/12/2023   Primary osteoarthritis of left hip 02/06/2023   Statin myopathy 10/24/2022   Atherosclerosis of aorta  10/17/2020   Nocturnal leg cramps 10/01/2018   Splenic artery aneurysm 03/01/2017   Thyroid  nodule 01/27/2017   Acute diverticulitis 01/24/2017   Recurrent herpes labialis 09/21/2016   Pure hypercholesterolemia 09/05/2016   Class 1 obesity with serious comorbidity and body mass index (BMI) of 31.0 to 31.9 in adult 09/05/2016   Bilateral hip pain 09/14/2015   Medicare annual wellness visit, subsequent 09/14/2015   Sigmoid diverticulitis 01/27/2015   LBBB (left bundle branch block) 12/23/2014   Hematochezia 12/23/2014   Abdominal pain    Diverticulitis of large intestine without perforation or abscess without bleeding    Chest pain    Solitary pulmonary nodule 06/09/2014   Left lower lobe pneumonia 10/06/2013   Urinary incontinence in female 03/02/2013   Postmenopause atrophic vaginitis 03/02/2013   RUQ PAIN 04/01/2007   Essential (primary) hypertension 03/24/2007   Diverticulosis of colon 03/17/2007     REFERRING PROVIDER: Jerri Kay HERO, MD   REFERRING DIAG: [phone] (ICD-10-CM) - Status post total replacement of right hip   THERAPY DIAG:  Pain in right leg  Muscle weakness (generalized)  Difficulty in walking, not elsewhere classified  Rationale for Evaluation and Treatment: Rehabilitation  ONSET DATE: DOS  05/13/2023  SUBJECTIVE:   SUBJECTIVE STATEMENT: Pt underwent R THA on 05/13/23.  Pt had HHPT for 3-4 weeks after surgery.  Pt saw MD on 01/29/24 and had x rays.  MD note indicated x ray showed components intact.  Pt states she has nerve damage from the end of the incision to her knee.  Pt reports having difficulty with car transfers and performing stairs.  She has to perform stairs one at a time.  Pt will not perform stairs if there is no rail.  Pt is cautious with activity.  Pt unable to carry 2 bags of groceries.  Pt is limited with ambulation distance and has pain with walking.  Pt has pain with lifting leg.  Pt does perform household chores though is limited.       PERTINENT HISTORY: R THA anterior approach on 05/13/23 Osteoporosis OA including L hip Bilat RCR, HTN  PAIN:  2-3/10 current and best pain, 7-8/10 worst pain R thigh and groin  PRECAUTIONS: Other: R THA with anterior approach, osteoporosis   WEIGHT BEARING RESTRICTIONS: No  FALLS:  Has patient fallen in last 6 months? Yes. Number of falls 2.  Trying to stop a soccer ball and bending over trying to move a rug  LIVING ENVIRONMENT: Lives with: lives alone Lives in: 1 story home Stairs: 4 steps to enter home with rails    PLOF: Independent  PATIENT GOALS: improved performance of car transfers and stairs.  To be able to perform stairs normally.  Improve strength.    OBJECTIVE:  Note: Objective measures were completed at Evaluation unless otherwise noted.  DIAGNOSTIC FINDINGS: X rays of R hip:  stable R THR without complication.  PATIENT SURVEYS:  LEFS:  14/80  COGNITION: Overall cognitive status: Within functional limits for tasks assessed      PALPATION: TTP:  R LE:  lateral thigh/ ITB,  mid to distal central thigh   LOWER EXTREMITY ROM:  Active ROM Right eval Left eval  Hip flexion    Hip extension    Hip abduction    Hip adduction    Hip internal rotation    Hip external rotation    Knee flexion 106 122  Knee extension    Ankle dorsiflexion    Ankle plantarflexion    Ankle inversion    Ankle eversion     (Blank rows = not tested)  LOWER EXTREMITY MMT:  MMT Right eval Left eval  Hip flexion Tolerated min resistance With pain; 19.8 5/5; 25.2  Hip extension    Hip abduction 11.2 29.2  Hip adduction    Hip internal rotation    Hip external rotation    Knee flexion 4+/5 with pain 5/5  Knee extension 4-/5 with pain 5/5  Ankle dorsiflexion    Ankle plantarflexion    Ankle inversion    Ankle eversion     (Blank rows = not tested)   FUNCTIONAL TESTS:  5x STS test:  15.76 sec  GAIT:  Comments:  Pt has a heel to toe gait pattern.   She has a minimal limp with gait and doesn't use an AD.  TREATMENT:    Pt performed LAQ 2x10 and supine bridge x 10 reps.  Pt performed supine heel slides though had pain.  Pt received a HEP handout and was educated in correct form and appropriate frequency.    PATIENT EDUCATION:  Education details: dx, objective findings, relevant anatomy, HEP, POC, rationale of interventions, and what to expect next treatment.  PT answered pt's questions.   Person educated: Patient Education method: Explanation, Demonstration, Tactile cues, Verbal cues, and Handouts Education comprehension: verbalized understanding, returned demonstration, verbal cues required, tactile cues required, and needs further education  HOME EXERCISE PROGRAM: Access Code: [email or web address] URL: [email or web address] Date: 05/25/2024 Prepared by: Mose Minerva  Exercises - Seated Long Arc Quad  - 1 x daily - 7 x weekly - 2 sets - 10 reps - Supine Bridge  - 1 x daily - 7 x weekly - 2 sets - 10 reps  ASSESSMENT:  CLINICAL IMPRESSION: Patient is a 74 y.o. female 1 year s/p R THA with anterior approach.  Pt has received HHPT after surgery.  Pt reports she has nerve damage in her R thigh.  Pt reports having difficulty with car transfers and performing stairs.  She is limited with ambulation distance and has pain with walking.  She has difficulty and pain with lifting her R leg.  Pt is limited with performing household chores.  She has weakness in R hip and knee.  Pt has a minimal limp with gait.  She should benefit from skilled PT to address impairments and improve overall function.   OBJECTIVE IMPAIRMENTS: Abnormal gait, decreased activity tolerance, decreased endurance, decreased mobility, difficulty walking, decreased ROM, decreased strength, and pain.   ACTIVITY LIMITATIONS: carrying, squatting,  stairs, transfers, and locomotion level  PARTICIPATION LIMITATIONS: cleaning, shopping, and community activity  PERSONAL FACTORS: 1-2 comorbidities: osteoporosis, L hip OA are also affecting patient's functional outcome.   REHAB POTENTIAL: Good  CLINICAL DECISION MAKING: Stable/uncomplicated  EVALUATION COMPLEXITY: Low   GOALS:  SHORT TERM GOALS: Target date: 06/15/24  Pt will be independent and compliant with HEP for improved pain, strength, and function.  Baseline: Goal status: INITIAL  2.  Pt will report at least a 25% improvement in pain and sx's overall.   Baseline:  Goal status: INITIAL  3.  Pt will report at least a 50% improvement in lifting her LE for improved dressing and performance of car transfers.  Baseline:  Goal status: INITIAL    LONG TERM GOALS: Target date: 07/06/2024  Pt will ambulate without limping.  Baseline:  Goal status: INITIAL  2.  Pt will be able to perform stairs with a  reciprocal gait with a rail.  Baseline:  Goal status: INITIAL  3.  Pt will be able to perform car transfers without difficulty.  Baseline:  Goal status: INITIAL  4.  Pt will report she is able to ambulate community distance without difficulty and increased pain.  Baseline:  Goal status: INITIAL  5.  Pt will demo improved R knee strength to 5/5 MMT and at least a 10# increase in R hip abd for improved performance of and tolerance with functional mobility.  Baseline:  Goal status: INITIAL    PLAN:  PT FREQUENCY: 2x/week  PT DURATION: 6 weeks  PLANNED INTERVENTIONS: 97164- PT Re-evaluation, 97750- Physical Performance Testing, 97110-Therapeutic exercises, 97530- Therapeutic activity, [id number]- Neuromuscular re-education, 97535- Self Care, 02859- Manual therapy, [phone]- Gait training, [phone]- Aquatic Therapy, [phone]- Electrical stimulation (unattended), [phone] (1-2 muscles), 20561 (3+ muscles)- Dry Needling, Patient/Family education, Balance training,  Stair training,  Taping, Cryotherapy, and Moist heat  PLAN FOR NEXT SESSION: Review and perform HEP.  R LE strengthening, proprioception, and gait.    Leigh Minerva III PT, DPT 05/26/24 9:21 PM

## 2024-05-25 ENCOUNTER — Other Ambulatory Visit (HOSPITAL_BASED_OUTPATIENT_CLINIC_OR_DEPARTMENT_OTHER): Payer: Self-pay

## 2024-05-25 ENCOUNTER — Ambulatory Visit (HOSPITAL_BASED_OUTPATIENT_CLINIC_OR_DEPARTMENT_OTHER): Attending: Orthopaedic Surgery | Admitting: Physical Therapy

## 2024-05-25 ENCOUNTER — Other Ambulatory Visit: Payer: Self-pay

## 2024-05-25 DIAGNOSIS — R262 Difficulty in walking, not elsewhere classified: Secondary | ICD-10-CM | POA: Diagnosis present

## 2024-05-25 DIAGNOSIS — M79604 Pain in right leg: Secondary | ICD-10-CM | POA: Insufficient documentation

## 2024-05-25 DIAGNOSIS — M6281 Muscle weakness (generalized): Secondary | ICD-10-CM | POA: Insufficient documentation

## 2024-05-25 DIAGNOSIS — Z96641 Presence of right artificial hip joint: Secondary | ICD-10-CM | POA: Insufficient documentation

## 2024-05-25 MED ORDER — FLUZONE HIGH-DOSE 0.5 ML IM SUSY
0.5000 mL | PREFILLED_SYRINGE | Freq: Once | INTRAMUSCULAR | 0 refills | Status: AC
  Filled 2024-05-25: qty 0.5, 1d supply, fill #0

## 2024-05-26 ENCOUNTER — Encounter (HOSPITAL_BASED_OUTPATIENT_CLINIC_OR_DEPARTMENT_OTHER): Payer: Self-pay | Admitting: Physical Therapy

## 2024-05-31 ENCOUNTER — Other Ambulatory Visit (INDEPENDENT_AMBULATORY_CARE_PROVIDER_SITE_OTHER): Payer: Self-pay | Admitting: Internal Medicine

## 2024-05-31 DIAGNOSIS — E88819 Insulin resistance, unspecified: Secondary | ICD-10-CM

## 2024-06-02 ENCOUNTER — Encounter (HOSPITAL_BASED_OUTPATIENT_CLINIC_OR_DEPARTMENT_OTHER): Payer: Self-pay

## 2024-06-02 ENCOUNTER — Ambulatory Visit (HOSPITAL_BASED_OUTPATIENT_CLINIC_OR_DEPARTMENT_OTHER): Attending: Orthopaedic Surgery

## 2024-06-02 DIAGNOSIS — M6281 Muscle weakness (generalized): Secondary | ICD-10-CM | POA: Diagnosis present

## 2024-06-02 DIAGNOSIS — M79604 Pain in right leg: Secondary | ICD-10-CM | POA: Insufficient documentation

## 2024-06-02 DIAGNOSIS — R262 Difficulty in walking, not elsewhere classified: Secondary | ICD-10-CM | POA: Diagnosis present

## 2024-06-02 NOTE — Therapy (Addendum)
 OUTPATIENT PHYSICAL THERAPY LOWER EXTREMITY TREATMENT   Patient Name: Cheryl Barnes MRN: [id number] DOB:11/18/1950, 73 y.o., female Today's Date: 06/02/2024  END OF SESSION:  PT End of Session - 06/02/24 0803     Visit Number 2    Number of Visits 12    Date for Recertification  07/06/24    Authorization Type MCR A and B    PT Start Time 0801    PT Stop Time 0845    PT Time Calculation (min) 44 min    Activity Tolerance Patient tolerated treatment well    Behavior During Therapy WFL for tasks assessed/performed          Past Medical History:  Diagnosis Date   Allergy    Arthritis    Back pain    Chest pain    Diverticulitis    High cholesterol    History of hiatal hernia    Hypertension    Joint pain    LBBB (left bundle branch block)    Migraine    history   Nonobstructive atherosclerosis of coronary artery    Osteoarthritis    Osteoarthritis, hip, bilateral    Past Surgical History:  Procedure Laterality Date   CESAREAN SECTION     x2   CHOLECYSTECTOMY     PARTIAL HYSTERECTOMY     Abdominal   ROTATOR CUFF REPAIR Left    ROTATOR CUFF REPAIR Right    TONSILLECTOMY     removed as a child   TOTAL HIP ARTHROPLASTY Right 05/13/2023   Procedure: RIGHT TOTAL HIP REPLACEMENT;  Surgeon: Jerri Kay HERO, MD;  Location: [facility] OR;  Service: Orthopedics;  Laterality: Right;  3-C   TYMPANOSTOMY TUBE PLACEMENT Right 2022   Patient Active Problem List   Diagnosis Date Noted   Osteoporosis 05/16/2024   Insulin  resistance 03/03/2024   At increased risk for cardiovascular disease 03/03/2024   Iron deficiency anemia 02/10/2024   Abnormal glucose 02/10/2024   Statin intolerance - tried several in past, 2-3 years ago 12/16/2023   Tinnitus of both ears 11/04/2023   Status post total replacement of right hip 05/13/2023   Primary osteoarthritis of right hip 05/12/2023   Primary osteoarthritis of left hip 02/06/2023   Statin myopathy 10/24/2022   Atherosclerosis of aorta  10/17/2020   Nocturnal leg cramps 10/01/2018   Splenic artery aneurysm 03/01/2017   Thyroid  nodule 01/27/2017   Acute diverticulitis 01/24/2017   Recurrent herpes labialis 09/21/2016   Pure hypercholesterolemia 09/05/2016   Class 1 obesity with serious comorbidity and body mass index (BMI) of 31.0 to 31.9 in adult 09/05/2016   Bilateral hip pain 09/14/2015   Medicare annual wellness visit, subsequent 09/14/2015   Sigmoid diverticulitis 01/27/2015   LBBB (left bundle branch block) 12/23/2014   Hematochezia 12/23/2014   Abdominal pain    Diverticulitis of large intestine without perforation or abscess without bleeding    Chest pain    Solitary pulmonary nodule 06/09/2014   Left lower lobe pneumonia 10/06/2013   Urinary incontinence in female 03/02/2013   Postmenopause atrophic vaginitis 03/02/2013   RUQ PAIN 04/01/2007   Essential (primary) hypertension 03/24/2007   Diverticulosis of colon 03/17/2007     REFERRING PROVIDER: Jerri Kay HERO, MD   REFERRING DIAG: [phone] (ICD-10-CM) - Status post total replacement of right hip   THERAPY DIAG:  Pain in right leg  Muscle weakness (generalized)  Difficulty in walking, not elsewhere classified  Rationale for Evaluation and Treatment: Rehabilitation  ONSET DATE: DOS  05/13/2023  SUBJECTIVE:   SUBJECTIVE STATEMENT:  Pt reports increased soreness following HEP performance. Describes this as more muscular than nerve pain. Does report hypersensitivity to touch in thigh.   IE: Pt underwent R THA on 05/13/23.  Pt had HHPT for 3-4 weeks after surgery.  Pt saw MD on 01/29/24 and had x rays.  MD note indicated x ray showed components intact.  Pt states she has nerve damage from the end of the incision to her knee.  Pt reports having difficulty with car transfers and performing stairs.  She has to perform stairs one at a time.  Pt will not perform stairs if there is no rail.  Pt is cautious with activity.  Pt unable to carry 2 bags of  groceries.  Pt is limited with ambulation distance and has pain with walking.  Pt has pain with lifting leg.  Pt does perform household chores though is limited.      PERTINENT HISTORY: R THA anterior approach on 05/13/23 Osteoporosis OA including L hip Bilat RCR, HTN  PAIN:  2/10 current and best pain, 7-8/10 worst pain R thigh and groin  PRECAUTIONS: Other: R THA with anterior approach, osteoporosis   WEIGHT BEARING RESTRICTIONS: No  FALLS:  Has patient fallen in last 6 months? Yes. Number of falls 2.  Trying to stop a soccer ball and bending over trying to move a rug  LIVING ENVIRONMENT: Lives with: lives alone Lives in: 1 story home Stairs: 4 steps to enter home with rails    PLOF: Independent  PATIENT GOALS: improved performance of car transfers and stairs.  To be able to perform stairs normally.  Improve strength.    OBJECTIVE:  Note: Objective measures were completed at Evaluation unless otherwise noted.  DIAGNOSTIC FINDINGS: X rays of R hip:  stable R THR without complication.  PATIENT SURVEYS:  LEFS:  14/80  COGNITION: Overall cognitive status: Within functional limits for tasks assessed      PALPATION: TTP:  R LE:  lateral thigh/ ITB,  mid to distal central thigh   LOWER EXTREMITY ROM:  Active ROM Right eval Left eval  Hip flexion    Hip extension    Hip abduction    Hip adduction    Hip internal rotation    Hip external rotation    Knee flexion 106 122  Knee extension    Ankle dorsiflexion    Ankle plantarflexion    Ankle inversion    Ankle eversion     (Blank rows = not tested)  LOWER EXTREMITY MMT:  MMT Right eval Left eval  Hip flexion Tolerated min resistance With pain; 19.8 5/5; 25.2  Hip extension    Hip abduction 11.2 29.2  Hip adduction    Hip internal rotation    Hip external rotation    Knee flexion 4+/5 with pain 5/5  Knee extension 4-/5 with pain 5/5  Ankle dorsiflexion    Ankle plantarflexion    Ankle inversion     Ankle eversion     (Blank rows = not tested)   FUNCTIONAL TESTS:  5x STS test:  15.76 sec  GAIT:  Comments:  Pt has a heel to toe gait pattern.  She has a minimal limp with gait and doesn't use an AD.  TREATMENT:    06/02/24: -STM/IASTM using roller to R quads/adductors -quad sets bilateral 3 holds -SAQ 2.5# 5x 10 -LAQ with cues for TKE 3 2x10 -s/l clams 2x10 -HEP update and review  IE Pt performed LAQ 2x10 and supine bridge x 10 reps.  Pt performed supine heel slides though had pain.  Pt received a HEP handout and was educated in correct form and appropriate frequency.    PATIENT EDUCATION:  Education details: dx, objective findings, relevant anatomy, HEP, POC, rationale of interventions, and what to expect next treatment.  PT answered pt's questions.   Person educated: Patient Education method: Explanation, Demonstration, Tactile cues, Verbal cues, and Handouts Education comprehension: verbalized understanding, returned demonstration, verbal cues required, tactile cues required, and needs further education  HOME EXERCISE PROGRAM: Access Code: [email or web address] URL: [email or web address] Date: 05/25/2024 Prepared by: Mose Minerva  Exercises - Seated Long Arc Quad  - 1 x daily - 7 x weekly - 2 sets - 10 reps - Supine Bridge  - 1 x daily - 7 x weekly - 2 sets - 10 reps  ASSESSMENT:  CLINICAL IMPRESSION:  STM and gentle IASTM using roller was performed to decrease significant tightness in quads and adductors. Instructed pt in self IASTM and STM to this area. Pt with poor R quad firing initially, though this did improve following cuing and contralateral performance. Educated pt on proper quad activation with SAQ and LAQ vs hip flexor. Educated on proper frequency of HEP and repetitions, instructing her to allow for adequate rest and to  not exceed listed repetitions. Pt has difficulty achieving TKE with LAQ and SAQ. Better with SAQ. Instructed her to focus on this with HEP within pain tolerance. Provided updated HEP and reviewed with pt.    IE: Patient is a 73 y.o. female 1 year s/p R THA with anterior approach.  Pt has received HHPT after surgery.  Pt reports she has nerve damage in her R thigh.  Pt reports having difficulty with car transfers and performing stairs.  She is limited with ambulation distance and has pain with walking.  She has difficulty and pain with lifting her R leg.  Pt is limited with performing household chores.  She has weakness in R hip and knee.  Pt has a minimal limp with gait.  She should benefit from skilled PT to address impairments and improve overall function.   OBJECTIVE IMPAIRMENTS: Abnormal gait, decreased activity tolerance, decreased endurance, decreased mobility, difficulty walking, decreased ROM, decreased strength, and pain.   ACTIVITY LIMITATIONS: carrying, squatting, stairs, transfers, and locomotion level  PARTICIPATION LIMITATIONS: cleaning, shopping, and community activity  PERSONAL FACTORS: 1-2 comorbidities: osteoporosis, L hip OA are also affecting patient's functional outcome.   REHAB POTENTIAL: Good  CLINICAL DECISION MAKING: Stable/uncomplicated  EVALUATION COMPLEXITY: Low   GOALS:  SHORT TERM GOALS: Target date: 06/15/24  Pt will be independent and compliant with HEP for improved pain, strength, and function.  Baseline: Goal status: INITIAL  2.  Pt will report at least a 25% improvement in pain and sx's overall.   Baseline:  Goal status: INITIAL  3.  Pt will report at least a 50% improvement in lifting her LE for improved dressing and performance of car transfers.  Baseline:  Goal status: INITIAL    LONG TERM GOALS: Target date: 07/06/2024  Pt will ambulate without limping.  Baseline:  Goal status: INITIAL  2.  Pt will be able to perform stairs with a   reciprocal gait with a rail.  Baseline:  Goal status: INITIAL  3.  Pt will be able to perform car transfers without difficulty.  Baseline:  Goal status: INITIAL  4.  Pt will report she is able to ambulate community distance without difficulty and increased pain.  Baseline:  Goal status: INITIAL  5.  Pt will demo improved R knee strength to 5/5 MMT and at least a 10# increase in R hip abd for improved performance of and tolerance with functional mobility.  Baseline:  Goal status: INITIAL    PLAN:  PT FREQUENCY: 2x/week  PT DURATION: 6 weeks  PLANNED INTERVENTIONS: 97164- PT Re-evaluation, 97750- Physical Performance Testing, 97110-Therapeutic exercises, 97530- Therapeutic activity, [id number]- Neuromuscular re-education, 97535- Self Care, 02859- Manual therapy, [id number]- Gait training, [phone]- Aquatic Therapy, [phone]- Electrical stimulation (unattended), 20560 (1-2 muscles), 20561 (3+ muscles)- Dry Needling, Patient/Family education, Balance training, Stair training, Taping, Cryotherapy, and Moist heat  PLAN FOR NEXT SESSION: Review and perform HEP.  R LE strengthening, proprioception, and gait.    Aminta Sakurai, PTA  06/02/24 5:38 PM

## 2024-06-09 ENCOUNTER — Ambulatory Visit (INDEPENDENT_AMBULATORY_CARE_PROVIDER_SITE_OTHER): Admitting: Internal Medicine

## 2024-06-09 ENCOUNTER — Encounter (INDEPENDENT_AMBULATORY_CARE_PROVIDER_SITE_OTHER): Payer: Self-pay | Admitting: Internal Medicine

## 2024-06-09 VITALS — BP 136/82 | HR 78 | Temp 98.3°F | Ht 60.0 in | Wt 154.0 lb

## 2024-06-09 DIAGNOSIS — R638 Other symptoms and signs concerning food and fluid intake: Secondary | ICD-10-CM | POA: Insufficient documentation

## 2024-06-09 DIAGNOSIS — Z6831 Body mass index (BMI) 31.0-31.9, adult: Secondary | ICD-10-CM

## 2024-06-09 DIAGNOSIS — E66811 Obesity, class 1: Secondary | ICD-10-CM

## 2024-06-09 DIAGNOSIS — E88819 Insulin resistance, unspecified: Secondary | ICD-10-CM | POA: Diagnosis not present

## 2024-06-09 NOTE — Assessment & Plan Note (Signed)
 Patient has been educated on the carb insulin  model for obesity and she is working on reducing carbs in her diet.  She is also on metformin  for pharmacoprophylaxis without any adverse effects.  Continue current weight management strategy

## 2024-06-09 NOTE — Progress Notes (Signed)
 Office: [phone]  /  Fax: [phone]  Weight Summary And Body Composition Analysis (BIA)  Vitals Temp: 98.3 F (36.8 C) BP: 136/82 Pulse Rate: 78 SpO2: 96 %   Anthropometric Measurements Height: 5' (1.524 m) Weight: 154 lb (69.9 kg) BMI (Calculated): 30.08 Weight at Last Visit: 158 lb Weight Lost Since Last Visit: 4 lb Weight Gained Since Last Visit: 0 lb Starting Weight: 163 lb Total Weight Loss (lbs): 9 lb (4.082 kg) Peak Weight: 165 lb   Body Composition  Body Fat %: 43.3 % Fat Mass (lbs): 67 lbs Muscle Mass (lbs): 83.2 lbs Total Body Water (lbs): 58.8 lbs Visceral Fat Rating : 12    RMR: 1325  Today's Visit #: 5  Starting Date: 02/09/24   Subjective   Chief Complaint: Obesity  Cheryl Barnes is here to discuss her progress with her obesity treatment plan. She is following the Category 1 plan - 1000 kcal per day and 1-2 meal replacements a day for convenience  and states she is following her eating plan approximately 90-100% of the time. She states she is exercising 40 minutes 5 times per week..  Weight Progress Since Last Visit:  Since last office visit she has lost 4 pounds. She reports good adherence to reduced calorie nutritional plan. She has been working on reading food labels, not skipping meals, increasing protein intake at every meal, drinking more water, making healthier choices, reducing portion sizes, and incorporating more whole foods   Nutritional 24 HR Recall: Intake consistent with prescribed nutritional plan  Challenges affecting patient progress: strong hunger signals and/or impaired satiety / inhibitory control and desired for rapid weight loss.   Orexigenic Control: Reports problems with appetite and hunger signals.  Reports problems with satiety and satiation.  Denies problems with eating patterns and portion control.  Denies abnormal cravings. Denies feeling deprived or restricted.   Pharmacotherapy for weight management: She  is currently taking Metformin  (off label use for weight management and / or insulin  resistance and / or diabetes prevention) with adequate clinical response  and without side effects..   Assessment and Plan   Treatment Plan For Obesity:  Recommended Dietary Goals  Cheryl Barnes is currently in the action stage of change. As such, her goal is to continue weight management plan. She has agreed to: continue current plan.  Was provided with a list of protein snack ideas as well as recipes for protein smoothies that are calorie specific.  Behavioral Health and Counseling  We discussed the following behavioral modification strategies today: increasing lean protein intake to established goals, increasing fiber rich foods  Additional education and resources provided today: Recipes for protein snacks and protein smoothies  Recommended Physical Activity Goals  Cheryl Barnes has been advised to work up to 150 minutes of moderate intensity aerobic activity a week and strengthening exercises 2-3 times per week for cardiovascular health, weight loss maintenance and preservation of muscle mass.   She has agreed to :  Increase volume of physical activity to a goal of 240 minutes a week and Combine aerobic and strengthening exercises for efficiency and improved cardiometabolic health.  Pharmacotherapy and Medical Interventions  Adequate clinical response to anti-obesity medication, continue current regimen  Associated Conditions Impacted by Obesity Treatment  Assessment & Plan Abnormal food appetite She has increased orexigenic signaling, impaired satiety and inhibitory control. This is secondary to an abnormal energy regulation system and pathological neurohormonal pathways characteristic of excess adiposity.  In addition to nutritional and behavioral strategies she benefits from pharmacotherapy.  Class 1 obesity with serious comorbidity and body mass index (BMI) of 31.0 to 31.9 in adult, unspecified obesity  type Weight: decrease of 14 lb (8.3%) over 5 months, 1 week  Start: 12/30/2023 168 lb (76.2 kg)  End: 06/09/2024 154 lb (69.9 kg)  Bioimpedance information suggest reductions in SAT and VAT.  We counseled patient on management of expectations particularly at her age where the recommended weight loss rate is 1 pound per week she would like to lose weight quicker than that.  She is doing her job improving body composition without the loss of muscle.  We discussed some of the challenges posed by rapid weight loss including precipitation of metabolic adaptations and unsustained results.  She is doing a great job losing weight but is still has increased hunger.  We discussed increasing protein intake and fibrous foods with her meals for better satiety.  She will continue on metformin .  Her protein.  She will continue working on increasing volume of physical activity. Insulin  resistance Patient has been educated on the carb insulin  model for obesity and she is working on reducing carbs in her diet.  She is also on metformin  for pharmacoprophylaxis without any adverse effects.  Continue current weight management strategy       Objective   Physical Exam:  Blood pressure 136/82, pulse 78, temperature 98.3 F (36.8 C), height 5' (1.524 m), weight 154 lb (69.9 kg), SpO2 96%. Body mass index is 30.08 kg/m.  General: She is overweight, cooperative, alert, well developed, and in no acute distress. PSYCH: Has normal mood, affect and thought process.   HEENT: EOMI, sclerae are anicteric. Lungs: Normal breathing effort, no conversational dyspnea. Extremities: No edema.  Neurologic: No gross sensory or motor deficits. No tremors or fasciculations noted.    Diagnostic Data Reviewed:  BMET    Component Value Date/Time   NA 143 11/04/2023 1053   NA 142 11/25/2020 1023   K 3.8 11/04/2023 1053   CL 108 11/04/2023 1053   CO2 26 11/04/2023 1053   GLUCOSE 102 (H) 11/04/2023 1053   BUN 17 11/04/2023 1053    BUN 17 11/25/2020 1023   CREATININE 0.71 11/04/2023 1053   CALCIUM  9.7 11/04/2023 1053   GFRNONAA >60 05/22/2023 0654   GFRAA >60 05/21/2017 1834   Lab Results  Component Value Date   HGBA1C 5.3 11/04/2023   HGBA1C 5.5 12/19/2014   Lab Results  Component Value Date   INSULIN  17.2 02/10/2024   Lab Results  Component Value Date   TSH 0.58 10/24/2022   CBC    Component Value Date/Time   WBC 4.5 02/10/2024 0935   WBC 5.1 05/22/2023 0654   RBC 4.85 02/10/2024 0935   RBC 3.13 (L) 05/22/2023 0654   HGB 14.6 02/10/2024 0935   HCT 45.7 02/10/2024 0935   PLT 171 02/10/2024 0935   MCV 94 02/10/2024 0935   MCH 30.1 02/10/2024 0935   MCH 31.0 05/22/2023 0654   MCHC 31.9 02/10/2024 0935   MCHC 32.9 05/22/2023 0654   RDW 12.6 02/10/2024 0935   Iron Studies    Component Value Date/Time   IRON 78 02/10/2024 0935   TIBC 320 02/10/2024 0935   FERRITIN 141 02/10/2024 0935   IRONPCTSAT 24 02/10/2024 0935   Lipid Panel     Component Value Date/Time   CHOL 203 (H) 11/04/2023 1053   TRIG 147.0 11/04/2023 1053   HDL 57.00 11/04/2023 1053   CHOLHDL 4 11/04/2023 1053   VLDL 29.4 11/04/2023 1053  LDLCALC 117 (H) 11/04/2023 1053   LDLDIRECT 120.6 08/22/2011 0821   Hepatic Function Panel     Component Value Date/Time   PROT 7.7 11/04/2023 1053   ALBUMIN 4.8 11/04/2023 1053   AST 20 11/04/2023 1053   ALT 19 11/04/2023 1053   ALKPHOS 116 11/04/2023 1053   BILITOT 0.6 11/04/2023 1053   BILIDIR 0.1 08/29/2016 0928   IBILI NOT CALCULATED 09/30/2013 1427      Component Value Date/Time   TSH 0.58 10/24/2022 0839   Nutritional Lab Results  Component Value Date   VD25OH 32.6 02/10/2024    Medications: Outpatient Encounter Medications as of 06/09/2024  Medication Sig Note   amLODipine -olmesartan  (AZOR ) 5-40 MG tablet Take 1 tablet by mouth daily.    cetirizine (ZYRTEC) 10 MG tablet Take 10 mg by mouth every evening.    gabapentin  (NEURONTIN ) 100 MG capsule Take 1 capsule  (100 mg total) by mouth 3 (three) times daily as needed.    metFORMIN  (GLUCOPHAGE -XR) 500 MG 24 hr tablet Take 1 tablet (500 mg total) by mouth daily with breakfast.    valACYclovir  (VALTREX ) 1000 MG tablet Take 2 tablets by mouth as needed upon acute onset and asap repeat dose in 12 hours 05/22/2023: Patient states that pharmacy was told that she could not take this medication with her Oxycodone . Patient would like to know if this is a contradiction.   [DISCONTINUED] melatonin 5 MG TABS Take 5 mg by mouth at bedtime as needed (sleep).    [DISCONTINUED] tiZANidine  (ZANAFLEX ) 4 MG tablet Take 1 tablet (4 mg total) by mouth every 8 (eight) hours as needed for muscle spasms.    No facility-administered encounter medications on file as of 06/09/2024.     Follow-Up   Return in about 4 weeks (around 07/07/2024) for For Weight Mangement with Dr. Francyne.Cheryl Barnes She was informed of the importance of frequent follow up visits to maximize her success with intensive lifestyle modifications for her multiple health conditions.  Attestation Statement   Reviewed by clinician on day of visit: allergies, medications, problem list, medical history, surgical history, family history, social history, and previous encounter notes.     Lucas Francyne, MD

## 2024-06-09 NOTE — Assessment & Plan Note (Signed)
 Weight: decrease of 14 lb (8.3%) over 5 months, 1 week  Start: 12/30/2023 168 lb (76.2 kg)  End: 06/09/2024 154 lb (69.9 kg)  Bioimpedance information suggest reductions in SAT and VAT.  We counseled patient on management of expectations particularly at her age where the recommended weight loss rate is 1 pound per week she would like to lose weight quicker than that.  She is doing her job improving body composition without the loss of muscle.  We discussed some of the challenges posed by rapid weight loss including precipitation of metabolic adaptations and unsustained results.  She is doing a great job losing weight but is still has increased hunger.  We discussed increasing protein intake and fibrous foods with her meals for better satiety.  She will continue on metformin .  Her protein.  She will continue working on increasing volume of physical activity.

## 2024-06-09 NOTE — Assessment & Plan Note (Signed)
She has increased orexigenic signaling, impaired satiety and inhibitory control. This is secondary to an abnormal energy regulation system and pathological neurohormonal pathways characteristic of excess adiposity.  In addition to nutritional and behavioral strategies she benefits from pharmacotherapy.

## 2024-06-09 NOTE — Progress Notes (Deleted)
 Office: [phone]  /  Fax: [phone]  Weight Summary and Body Composition Analysis (BIA)  Vitals Temp: 98.3 F (36.8 C) BP: 136/82 Pulse Rate: 78 SpO2: 96 %   Anthropometric Measurements Height: 5' (1.524 m) Weight: 154 lb (69.9 kg) BMI (Calculated): 30.08 Weight at Last Visit: 158 lb Weight Lost Since Last Visit: 4 lb Weight Gained Since Last Visit: 0 lb Starting Weight: 163 lb Total Weight Loss (lbs): 9 lb (4.082 kg) Peak Weight: 165 lb   Body Composition  Body Fat %: 43.3 % Fat Mass (lbs): 67 lbs Muscle Mass (lbs): 83.2 lbs Total Body Water (lbs): 58.8 lbs Visceral Fat Rating : 12    RMR: 1325  Today's Visit #: 5  Starting Date: 02/09/24   Subjective   Chief Complaint: Obesity  Interval History Discussed the use of AI scribe software for clinical note transcription with the patient, who gave verbal consent to proceed.  History of Present Illness      Challenges affecting patient progress: {EMOBESITYBARRIERS:28841::none}.    Pharmacotherapy for weight management: She is currently taking {EMPharmaco:28845}.   Assessment and Plan   Treatment Plan For Obesity:  Recommended Dietary Goals  Cheryl Barnes is currently in the action stage of change. As such, her goal is to continue weight management plan. She has agreed to: {EMWTLOSSPLAN:29297::continue current plan}  Behavioral Health and Counseling  We discussed the following behavioral modification strategies today: {EMWMwtlossstrategies:28914::continue to work on maintaining a reduced calorie state, getting the recommended amount of protein, incorporating whole foods, making healthy choices, staying well hydrated and practicing mindfulness when eating.,increase protein intake, fibrous foods (25 grams per day for women, 30 grams for men) and water to improve satiety and decrease hunger signals. }.  Additional education and resources provided  today: {EMadditionalresources:29169::None}  Recommended Physical Activity Goals  Cheryl Barnes has been advised to work up to 150 minutes of moderate intensity aerobic activity a week and strengthening exercises 2-3 times per week for cardiovascular health, weight loss maintenance and preservation of muscle mass.  She has agreed to :  {EMEXERCISE:28847::Think about enjoyable ways to increase daily physical activity and overcoming barriers to exercise,Increase physical activity in their day and reduce sedentary time (increase NEAT).,Increase volume of physical activity to a goal of 240 minutes a week,Combine aerobic and strengthening exercises for efficiency and improved cardiometabolic health.}  Medical Interventions and Pharmacotherapy  We discussed various medication options to help Cheryl Barnes with her weight loss efforts and we both agreed to : {EMagreedrx:29170}  Associated Conditions Impacted by Obesity Treatment  Assessment & Plan     Assessment and Plan Assessment & Plan       Objective   Physical Exam:  Blood pressure 136/82, pulse 78, temperature 98.3 F (36.8 C), height 5' (1.524 m), weight 154 lb (69.9 kg), SpO2 96%. Body mass index is 30.08 kg/m.  General: She is overweight, cooperative, alert, well developed, and in no acute distress. PSYCH: Has normal mood, affect and thought process.   HEENT: EOMI, sclerae are anicteric. Lungs: Normal breathing effort, no conversational dyspnea. Extremities: No edema.  Neurologic: No gross sensory or motor deficits. No tremors or fasciculations noted.    Diagnostic Data Reviewed:  BMET    Component Value Date/Time   NA 143 11/04/2023 1053   NA 142 11/25/2020 1023   K 3.8 11/04/2023 1053   CL 108 11/04/2023 1053   CO2 26 11/04/2023 1053   GLUCOSE 102 (H) 11/04/2023 1053   BUN 17 11/04/2023 1053   BUN 17 11/25/2020 1023  CREATININE 0.71 11/04/2023 1053   CALCIUM  9.7 11/04/2023 1053   GFRNONAA >60 05/22/2023  0654   GFRAA >60 05/21/2017 1834   Lab Results  Component Value Date   HGBA1C 5.3 11/04/2023   HGBA1C 5.5 12/19/2014   Lab Results  Component Value Date   INSULIN  17.2 02/10/2024   Lab Results  Component Value Date   TSH 0.58 10/24/2022   CBC    Component Value Date/Time   WBC 4.5 02/10/2024 0935   WBC 5.1 05/22/2023 0654   RBC 4.85 02/10/2024 0935   RBC 3.13 (L) 05/22/2023 0654   HGB 14.6 02/10/2024 0935   HCT 45.7 02/10/2024 0935   PLT 171 02/10/2024 0935   MCV 94 02/10/2024 0935   MCH 30.1 02/10/2024 0935   MCH 31.0 05/22/2023 0654   MCHC 31.9 02/10/2024 0935   MCHC 32.9 05/22/2023 0654   RDW 12.6 02/10/2024 0935   Iron Studies    Component Value Date/Time   IRON 78 02/10/2024 0935   TIBC 320 02/10/2024 0935   FERRITIN 141 02/10/2024 0935   IRONPCTSAT 24 02/10/2024 0935   Lipid Panel     Component Value Date/Time   CHOL 203 (H) 11/04/2023 1053   TRIG 147.0 11/04/2023 1053   HDL 57.00 11/04/2023 1053   CHOLHDL 4 11/04/2023 1053   VLDL 29.4 11/04/2023 1053   LDLCALC 117 (H) 11/04/2023 1053   LDLDIRECT 120.6 08/22/2011 0821   Hepatic Function Panel     Component Value Date/Time   PROT 7.7 11/04/2023 1053   ALBUMIN 4.8 11/04/2023 1053   AST 20 11/04/2023 1053   ALT 19 11/04/2023 1053   ALKPHOS 116 11/04/2023 1053   BILITOT 0.6 11/04/2023 1053   BILIDIR 0.1 08/29/2016 0928   IBILI NOT CALCULATED 09/30/2013 1427      Component Value Date/Time   TSH 0.58 10/24/2022 0839   Nutritional Lab Results  Component Value Date   VD25OH 32.6 02/10/2024    Medications: Outpatient Encounter Medications as of 06/09/2024  Medication Sig Note   amLODipine -olmesartan  (AZOR ) 5-40 MG tablet Take 1 tablet by mouth daily.    cetirizine (ZYRTEC) 10 MG tablet Take 10 mg by mouth every evening.    gabapentin  (NEURONTIN ) 100 MG capsule Take 1 capsule (100 mg total) by mouth 3 (three) times daily as needed.    metFORMIN  (GLUCOPHAGE -XR) 500 MG 24 hr tablet Take 1  tablet (500 mg total) by mouth daily with breakfast.    valACYclovir  (VALTREX ) 1000 MG tablet Take 2 tablets by mouth as needed upon acute onset and asap repeat dose in 12 hours 05/22/2023: Patient states that pharmacy was told that she could not take this medication with her Oxycodone . Patient would like to know if this is a contradiction.   [DISCONTINUED] melatonin 5 MG TABS Take 5 mg by mouth at bedtime as needed (sleep).    [DISCONTINUED] tiZANidine  (ZANAFLEX ) 4 MG tablet Take 1 tablet (4 mg total) by mouth every 8 (eight) hours as needed for muscle spasms.    No facility-administered encounter medications on file as of 06/09/2024.     Follow-Up   No follow-ups on file.SABRA She was informed of the importance of frequent follow up visits to maximize her success with intensive lifestyle modifications for her multiple health conditions.  Attestation Statement   Reviewed by clinician on day of visit: allergies, medications, problem list, medical history, surgical history, family history, social history, and previous encounter notes.     Cheryl Parker, MD

## 2024-06-11 ENCOUNTER — Ambulatory Visit (HOSPITAL_BASED_OUTPATIENT_CLINIC_OR_DEPARTMENT_OTHER): Admitting: Physical Therapy

## 2024-06-11 DIAGNOSIS — R262 Difficulty in walking, not elsewhere classified: Secondary | ICD-10-CM

## 2024-06-11 DIAGNOSIS — M79604 Pain in right leg: Secondary | ICD-10-CM | POA: Diagnosis not present

## 2024-06-11 DIAGNOSIS — M6281 Muscle weakness (generalized): Secondary | ICD-10-CM

## 2024-06-11 NOTE — Therapy (Signed)
 OUTPATIENT PHYSICAL THERAPY LOWER EXTREMITY TREATMENT   Patient Name: Cheryl Barnes MRN: [id number] DOB:Mar 11, 1951, 73 y.o., female Today's Date: 06/12/2024  END OF SESSION:  PT End of Session - 06/11/24 1115     Visit Number 3    Number of Visits 12    Date for Recertification  07/06/24    Authorization Type MCR A and B    PT Start Time 1028    PT Stop Time 1104    PT Time Calculation (min) 36 min    Activity Tolerance Patient tolerated treatment well    Behavior During Therapy WFL for tasks assessed/performed           Past Medical History:  Diagnosis Date   Allergy    Arthritis    Back pain    Chest pain    Diverticulitis    High cholesterol    History of hiatal hernia    Hypertension    Joint pain    LBBB (left bundle branch block)    Migraine    history   Nonobstructive atherosclerosis of coronary artery    Osteoarthritis    Osteoarthritis, hip, bilateral    Past Surgical History:  Procedure Laterality Date   CESAREAN SECTION     x2   CHOLECYSTECTOMY     PARTIAL HYSTERECTOMY     Abdominal   ROTATOR CUFF REPAIR Left    ROTATOR CUFF REPAIR Right    TONSILLECTOMY     removed as a child   TOTAL HIP ARTHROPLASTY Right 05/13/2023   Procedure: RIGHT TOTAL HIP REPLACEMENT;  Surgeon: Jerri Kay HERO, MD;  Location: [facility] OR;  Service: Orthopedics;  Laterality: Right;  3-C   TYMPANOSTOMY TUBE PLACEMENT Right 2022   Patient Active Problem List   Diagnosis Date Noted   Abnormal food appetite 06/09/2024   Osteoporosis 05/16/2024   Insulin  resistance 03/03/2024   At increased risk for cardiovascular disease 03/03/2024   Iron deficiency anemia 02/10/2024   Abnormal glucose 02/10/2024   Statin intolerance - tried several in past, 2-3 years ago 12/16/2023   Tinnitus of both ears 11/04/2023   Status post total replacement of right hip 05/13/2023   Primary osteoarthritis of right hip 05/12/2023   Primary osteoarthritis of left hip 02/06/2023   Statin myopathy  10/24/2022   Atherosclerosis of aorta 10/17/2020   Nocturnal leg cramps 10/01/2018   Splenic artery aneurysm 03/01/2017   Thyroid  nodule 01/27/2017   Acute diverticulitis 01/24/2017   Recurrent herpes labialis 09/21/2016   Pure hypercholesterolemia 09/05/2016   Class 1 obesity with serious comorbidity and body mass index (BMI) of 31.0 to 31.9 in adult 09/05/2016   Bilateral hip pain 09/14/2015   Medicare annual wellness visit, subsequent 09/14/2015   Sigmoid diverticulitis 01/27/2015   LBBB (left bundle branch block) 12/23/2014   Hematochezia 12/23/2014   Abdominal pain    Diverticulitis of large intestine without perforation or abscess without bleeding    Chest pain    Solitary pulmonary nodule 06/09/2014   Left lower lobe pneumonia 10/06/2013   Urinary incontinence in female 03/02/2013   Postmenopause atrophic vaginitis 03/02/2013   RUQ PAIN 04/01/2007   Essential (primary) hypertension 03/24/2007   Diverticulosis of colon 03/17/2007     REFERRING PROVIDER: Jerri Kay HERO, MD   REFERRING DIAG: [phone] (ICD-10-CM) - Status post total replacement of right hip   THERAPY DIAG:  Pain in right leg  Muscle weakness (generalized)  Difficulty in walking, not elsewhere classified  Rationale for Evaluation and Treatment: Rehabilitation  ONSET DATE: DOS  05/13/2023  SUBJECTIVE:   SUBJECTIVE STATEMENT:  Pt states she felt fine after prior Rx and was a little sore.  She states the manual therapy really helped.  Pt reports compliance with HEP.  Pt states she used the roller at home on her R LE.  Pt states she is able to perform car transfers easier.     PERTINENT HISTORY: R THA anterior approach on 05/13/23 Osteoporosis OA including L hip Bilat RCR, HTN  PAIN:  0/10 current at rest and 2/10 with movement, 7-8/10 worst pain R thigh and groin  PRECAUTIONS: Other: R THA with anterior approach, osteoporosis   WEIGHT BEARING RESTRICTIONS: No  FALLS:  Has patient fallen in  last 6 months? Yes. Number of falls 2.  Trying to stop a soccer ball and bending over trying to move a rug  LIVING ENVIRONMENT: Lives with: lives alone Lives in: 1 story home Stairs: 4 steps to enter home with rails    PLOF: Independent  PATIENT GOALS: improved performance of car transfers and stairs.  To be able to perform stairs normally.  Improve strength.    OBJECTIVE:  Note: Objective measures were completed at Evaluation unless otherwise noted.  DIAGNOSTIC FINDINGS: X rays of R hip:  stable R THR without complication.  PATIENT SURVEYS:  LEFS:  14/80  COGNITION: Overall cognitive status: Within functional limits for tasks assessed      PALPATION: TTP:  R LE:  lateral thigh/ ITB,  mid to distal central thigh   LOWER EXTREMITY ROM:  Active ROM Right eval Left eval  Hip flexion    Hip extension    Hip abduction    Hip adduction    Hip internal rotation    Hip external rotation    Knee flexion 106 122  Knee extension    Ankle dorsiflexion    Ankle plantarflexion    Ankle inversion    Ankle eversion     (Blank rows = not tested)  LOWER EXTREMITY MMT:  MMT Right eval Left eval  Hip flexion Tolerated min resistance With pain; 19.8 5/5; 25.2  Hip extension    Hip abduction 11.2 29.2  Hip adduction    Hip internal rotation    Hip external rotation    Knee flexion 4+/5 with pain 5/5  Knee extension 4-/5 with pain 5/5  Ankle dorsiflexion    Ankle plantarflexion    Ankle inversion    Ankle eversion     (Blank rows = not tested)   FUNCTIONAL TESTS:  5x STS test:  15.76 sec  GAIT:  Comments:  Pt has a heel to toe gait pattern.  She has a minimal limp with gait and doesn't use an AD.                                                                                                                                TREATMENT:    06/09/24: Reviewed  response to prior treatment, HEP compliance, and pain levels.  -STM/IASTM using roller to R  quads/adductors in supine with foam roll under knee  -SAQ with 2.5#  with 3-5 sec hold -supine bridge 2x10 -LAQ with 3 sec hold 2x10 -seated hip abd with RTB 2x10     06/02/24: -STM/IASTM using roller to R quads/adductors -quad sets bilateral 3 holds -SAQ 2.5# 5x 10 -LAQ with cues for TKE 3 2x10 -s/l clams 2x10 -HEP update and review  IE Pt performed LAQ 2x10 and supine bridge x 10 reps.  Pt performed supine heel slides though had pain.  Pt received a HEP handout and was educated in correct form and appropriate frequency.    PATIENT EDUCATION:  Education details: dx, exercise form, relevant anatomy, HEP, POC, rationale of interventions, and what to expect next treatment.  PT answered pt's questions.   Person educated: Patient Education method: Explanation, Demonstration, Tactile cues, Verbal cues, and Handouts Education comprehension: verbalized understanding, returned demonstration, verbal cues required, tactile cues required, and needs further education  HOME EXERCISE PROGRAM: Access Code: [email or web address] URL: [email or web address] Date: 05/25/2024 Prepared by: Mose Minerva  Exercises - Seated Long Arc Quad  - 1 x daily - 7 x weekly - 2 sets - 10 reps - Supine Bridge  - 1 x daily - 7 x weekly - 2 sets - 10 reps  ASSESSMENT:  CLINICAL IMPRESSION:  Pt states she is able to perform car transfers easier.  Pt has soft tissue tightness in R quad and hip adductors.  PT performed STM and used the roller to improve soft tissue tightness and mobility and pain and to reduce myofacial restrictions.  She had tenderness with STM/IASTM.  Pt performed exercises well with cuing for correct form.  She responded well to treatment reporting no increased pain after treatment.  She should benefit from skilled PT to address impairments and goals and improve overall function.    OBJECTIVE IMPAIRMENTS: Abnormal gait, decreased activity tolerance, decreased endurance, decreased  mobility, difficulty walking, decreased ROM, decreased strength, and pain.   ACTIVITY LIMITATIONS: carrying, squatting, stairs, transfers, and locomotion level  PARTICIPATION LIMITATIONS: cleaning, shopping, and community activity  PERSONAL FACTORS: 1-2 comorbidities: osteoporosis, L hip OA are also affecting patient's functional outcome.   REHAB POTENTIAL: Good  CLINICAL DECISION MAKING: Stable/uncomplicated  EVALUATION COMPLEXITY: Low   GOALS:  SHORT TERM GOALS: Target date: 06/15/24  Pt will be independent and compliant with HEP for improved pain, strength, and function.  Baseline: Goal status: INITIAL  2.  Pt will report at least a 25% improvement in pain and sx's overall.   Baseline:  Goal status: INITIAL  3.  Pt will report at least a 50% improvement in lifting her LE for improved dressing and performance of car transfers.  Baseline:  Goal status: INITIAL    LONG TERM GOALS: Target date: 07/06/2024  Pt will ambulate without limping.  Baseline:  Goal status: INITIAL  2.  Pt will be able to perform stairs with a  reciprocal gait with a rail.  Baseline:  Goal status: INITIAL  3.  Pt will be able to perform car transfers without difficulty.  Baseline:  Goal status: INITIAL  4.  Pt will report she is able to ambulate community distance without difficulty and increased pain.  Baseline:  Goal status: INITIAL  5.  Pt will demo improved R knee strength to 5/5 MMT and at least a 10# increase in R hip abd for improved performance of and tolerance with functional mobility.  Baseline:  Goal status: INITIAL    PLAN:  PT FREQUENCY: 2x/week  PT DURATION: 6 weeks  PLANNED INTERVENTIONS: 97164- PT Re-evaluation, 97750- Physical Performance Testing, 97110-Therapeutic exercises, 97530- Therapeutic activity, [id number]- Neuromuscular re-education, 97535- Self Care, 02859- Manual therapy, [id number]- Gait training, [phone]- Aquatic Therapy, [phone]- Electrical stimulation  (unattended), 20560 (1-2 muscles), 20561 (3+ muscles)- Dry Needling, Patient/Family education, Balance training, Stair training, Taping, Cryotherapy, and Moist heat  PLAN FOR NEXT SESSION: Review and perform HEP.  R LE strengthening, proprioception, and gait.  Cont with STM/IASTM.   Leigh Minerva III PT, DPT 06/12/24 3:16 PM

## 2024-06-12 ENCOUNTER — Encounter (HOSPITAL_BASED_OUTPATIENT_CLINIC_OR_DEPARTMENT_OTHER): Payer: Self-pay | Admitting: Physical Therapy

## 2024-06-16 ENCOUNTER — Encounter (HOSPITAL_BASED_OUTPATIENT_CLINIC_OR_DEPARTMENT_OTHER): Payer: Self-pay | Admitting: Physical Therapy

## 2024-06-16 ENCOUNTER — Ambulatory Visit (HOSPITAL_BASED_OUTPATIENT_CLINIC_OR_DEPARTMENT_OTHER): Admitting: Physical Therapy

## 2024-06-16 DIAGNOSIS — M79604 Pain in right leg: Secondary | ICD-10-CM | POA: Diagnosis not present

## 2024-06-16 DIAGNOSIS — M6281 Muscle weakness (generalized): Secondary | ICD-10-CM

## 2024-06-16 DIAGNOSIS — R262 Difficulty in walking, not elsewhere classified: Secondary | ICD-10-CM

## 2024-06-16 NOTE — Therapy (Signed)
 OUTPATIENT PHYSICAL THERAPY LOWER EXTREMITY TREATMENT   Patient Name: Cheryl Barnes MRN: [id number] DOB:1951-06-14, 73 y.o., female Today's Date: 06/16/2024  END OF SESSION:  PT End of Session - 06/16/24 0856     Visit Number 4    Number of Visits 12    Date for Recertification  07/06/24    Authorization Type MCR A and B    PT Start Time 0846    PT Stop Time 0925    PT Time Calculation (min) 39 min    Activity Tolerance Patient tolerated treatment well    Behavior During Therapy WFL for tasks assessed/performed            Past Medical History:  Diagnosis Date   Allergy    Arthritis    Back pain    Chest pain    Diverticulitis    High cholesterol    History of hiatal hernia    Hypertension    Joint pain    LBBB (left bundle branch block)    Migraine    history   Nonobstructive atherosclerosis of coronary artery    Osteoarthritis    Osteoarthritis, hip, bilateral    Past Surgical History:  Procedure Laterality Date   CESAREAN SECTION     x2   CHOLECYSTECTOMY     PARTIAL HYSTERECTOMY     Abdominal   ROTATOR CUFF REPAIR Left    ROTATOR CUFF REPAIR Right    TONSILLECTOMY     removed as a child   TOTAL HIP ARTHROPLASTY Right 05/13/2023   Procedure: RIGHT TOTAL HIP REPLACEMENT;  Surgeon: Jerri Kay HERO, MD;  Location: [facility] OR;  Service: Orthopedics;  Laterality: Right;  3-C   TYMPANOSTOMY TUBE PLACEMENT Right 2022   Patient Active Problem List   Diagnosis Date Noted   Abnormal food appetite 06/09/2024   Osteoporosis 05/16/2024   Insulin  resistance 03/03/2024   At increased risk for cardiovascular disease 03/03/2024   Iron deficiency anemia 02/10/2024   Abnormal glucose 02/10/2024   Statin intolerance - tried several in past, 2-3 years ago 12/16/2023   Tinnitus of both ears 11/04/2023   Status post total replacement of right hip 05/13/2023   Primary osteoarthritis of right hip 05/12/2023   Primary osteoarthritis of left hip 02/06/2023   Statin  myopathy 10/24/2022   Atherosclerosis of aorta 10/17/2020   Nocturnal leg cramps 10/01/2018   Splenic artery aneurysm 03/01/2017   Thyroid  nodule 01/27/2017   Acute diverticulitis 01/24/2017   Recurrent herpes labialis 09/21/2016   Pure hypercholesterolemia 09/05/2016   Class 1 obesity with serious comorbidity and body mass index (BMI) of 31.0 to 31.9 in adult 09/05/2016   Bilateral hip pain 09/14/2015   Medicare annual wellness visit, subsequent 09/14/2015   Sigmoid diverticulitis 01/27/2015   LBBB (left bundle branch block) 12/23/2014   Hematochezia 12/23/2014   Abdominal pain    Diverticulitis of large intestine without perforation or abscess without bleeding    Chest pain    Solitary pulmonary nodule 06/09/2014   Left lower lobe pneumonia 10/06/2013   Urinary incontinence in female 03/02/2013   Postmenopause atrophic vaginitis 03/02/2013   RUQ PAIN 04/01/2007   Essential (primary) hypertension 03/24/2007   Diverticulosis of colon 03/17/2007     REFERRING PROVIDER: Jerri Kay HERO, MD   REFERRING DIAG: [phone] (ICD-10-CM) - Status post total replacement of right hip   THERAPY DIAG:  Pain in right leg  Difficulty in walking, not elsewhere classified  Muscle weakness (generalized)  Rationale for Evaluation and Treatment:  Rehabilitation  ONSET DATE: DOS  05/13/2023  SUBJECTIVE:   SUBJECTIVE STATEMENT:  Its OK, the nerve part is irritated today. When I had hip surgery it damaged a nerve and I took a gabapentin . It gets irritated from exercises     PERTINENT HISTORY: R THA anterior approach on 05/13/23 Osteoporosis OA including L hip Bilat RCR, HTN  PAIN:  2/10 current , 6-7/10 worst pain R groin down to knee Some nerve pain Aggravating motions make it feel worse Stop doing what I'm doing makes it better   PRECAUTIONS: Other: R THA with anterior approach, osteoporosis   WEIGHT BEARING RESTRICTIONS: No  FALLS:  Has patient fallen in last 6 months? Yes.  Number of falls 2.  Trying to stop a soccer ball and bending over trying to move a rug  LIVING ENVIRONMENT: Lives with: lives alone Lives in: 1 story home Stairs: 4 steps to enter home with rails    PLOF: Independent  PATIENT GOALS: improved performance of car transfers and stairs.  To be able to perform stairs normally.  Improve strength.    OBJECTIVE:  Note: Objective measures were completed at Evaluation unless otherwise noted.  DIAGNOSTIC FINDINGS: X rays of R hip:  stable R THR without complication.  PATIENT SURVEYS:  LEFS:  14/80  COGNITION: Overall cognitive status: Within functional limits for tasks assessed      PALPATION: TTP:  R LE:  lateral thigh/ ITB,  mid to distal central thigh   LOWER EXTREMITY ROM:  Active ROM Right eval Left eval  Hip flexion    Hip extension    Hip abduction    Hip adduction    Hip internal rotation    Hip external rotation    Knee flexion 106 122  Knee extension    Ankle dorsiflexion    Ankle plantarflexion    Ankle inversion    Ankle eversion     (Blank rows = not tested)  LOWER EXTREMITY MMT:  MMT Right eval Left eval  Hip flexion Tolerated min resistance With pain; 19.8 5/5; 25.2  Hip extension    Hip abduction 11.2 29.2  Hip adduction    Hip internal rotation    Hip external rotation    Knee flexion 4+/5 with pain 5/5  Knee extension 4-/5 with pain 5/5  Ankle dorsiflexion    Ankle plantarflexion    Ankle inversion    Ankle eversion     (Blank rows = not tested)   FUNCTIONAL TESTS:  5x STS test:  15.76 sec  GAIT:  Comments:  Pt has a heel to toe gait pattern.  She has a minimal limp with gait and doesn't use an AD.                                                                                                                                TREATMENT:    06/16/24  Nustep L3x8 minutes seat 5, all four extremities  Bridge 12x2 second holds SLR R LE x8 SAQs 3# 10x2 second holds (2 round) Supine  hip ABD x10 R foot in pillow case Heel slides R x10 foot in pillow case   Percussion gun on low vibration setting to R quad       06/09/24: Reviewed response to prior treatment, HEP compliance, and pain levels.  -STM/IASTM using roller to R quads/adductors in supine with foam roll under knee  -SAQ with 2.5#  with 3-5 sec hold -supine bridge 2x10 -LAQ with 3 sec hold 2x10 -seated hip abd with RTB 2x10     06/02/24: -STM/IASTM using roller to R quads/adductors -quad sets bilateral 3 holds -SAQ 2.5# 5x 10 -LAQ with cues for TKE 3 2x10 -s/l clams 2x10 -HEP update and review  IE Pt performed LAQ 2x10 and supine bridge x 10 reps.  Pt performed supine heel slides though had pain.  Pt received a HEP handout and was educated in correct form and appropriate frequency.    PATIENT EDUCATION:  Education details: dx, exercise form, relevant anatomy, HEP, POC, rationale of interventions, and what to expect next treatment.  PT answered pt's questions.   Person educated: Patient Education method: Explanation, Demonstration, Tactile cues, Verbal cues, and Handouts Education comprehension: verbalized understanding, returned demonstration, verbal cues required, tactile cues required, and needs further education  HOME EXERCISE PROGRAM: Access Code: [email or web address] URL: [email or web address] Date: 05/25/2024 Prepared by: Mose Minerva  Exercises - Seated Long Arc Quad  - 1 x daily - 7 x weekly - 2 sets - 10 reps - Supine Bridge  - 1 x daily - 7 x weekly - 2 sets - 10 reps  ASSESSMENT:  CLINICAL IMPRESSION:  Arrives today doing OK, reports ongoing nerve pain which she feels is aggravated by exercise. We tried the Nustep today then progressed exercises as able. Also tried the percussion gun for ongoing soft tissue limitations today. Will continue to progress as able/tolerated.    OBJECTIVE IMPAIRMENTS: Abnormal gait, decreased activity tolerance, decreased endurance,  decreased mobility, difficulty walking, decreased ROM, decreased strength, and pain.   ACTIVITY LIMITATIONS: carrying, squatting, stairs, transfers, and locomotion level  PARTICIPATION LIMITATIONS: cleaning, shopping, and community activity  PERSONAL FACTORS: 1-2 comorbidities: osteoporosis, L hip OA are also affecting patient's functional outcome.   REHAB POTENTIAL: Good  CLINICAL DECISION MAKING: Stable/uncomplicated  EVALUATION COMPLEXITY: Low   GOALS:  SHORT TERM GOALS: Target date: 06/15/24  Pt will be independent and compliant with HEP for improved pain, strength, and function.  Baseline: Goal status: INITIAL  2.  Pt will report at least a 25% improvement in pain and sx's overall.   Baseline:  Goal status: INITIAL  3.  Pt will report at least a 50% improvement in lifting her LE for improved dressing and performance of car transfers.  Baseline:  Goal status: INITIAL    LONG TERM GOALS: Target date: 07/06/2024  Pt will ambulate without limping.  Baseline:  Goal status: INITIAL  2.  Pt will be able to perform stairs with a  reciprocal gait with a rail.  Baseline:  Goal status: INITIAL  3.  Pt will be able to perform car transfers without difficulty.  Baseline:  Goal status: INITIAL  4.  Pt will report she is able to ambulate community distance without difficulty and increased pain.  Baseline:  Goal status: INITIAL  5.  Pt will demo improved R knee strength to 5/5 MMT and at least a 10# increase in R hip abd for improved performance of  and tolerance with functional mobility.  Baseline:  Goal status: INITIAL    PLAN:  PT FREQUENCY: 2x/week  PT DURATION: 6 weeks  PLANNED INTERVENTIONS: 97164- PT Re-evaluation, 97750- Physical Performance Testing, 97110-Therapeutic exercises, 97530- Therapeutic activity, [id number]- Neuromuscular re-education, 97535- Self Care, 02859- Manual therapy, [id number]- Gait training, [phone]- Aquatic Therapy, [phone]- Electrical stimulation  (unattended), 20560 (1-2 muscles), 20561 (3+ muscles)- Dry Needling, Patient/Family education, Balance training, Stair training, Taping, Cryotherapy, and Moist heat  PLAN FOR NEXT SESSION: Review and perform HEP.  R LE strengthening, proprioception, and gait.  Cont with STM/IASTM, percussion gun if this was helpful    Josette Rough, PT, DPT 06/16/24 9:25 AM

## 2024-06-17 NOTE — Therapy (Unsigned)
 OUTPATIENT PHYSICAL THERAPY LOWER EXTREMITY TREATMENT   Patient Name: Cheryl Barnes MRN: [id number] DOB:[birth year], 73 y.o., female Today's Date: 06/18/2024  END OF SESSION:  PT End of Session - 06/18/24 0758     Visit Number 5    Number of Visits 12    Date for Recertification  07/06/24    Authorization Type MCR A and B    PT Start Time 0758    PT Stop Time 0838    PT Time Calculation (min) 40 min    Activity Tolerance Patient tolerated treatment well    Behavior During Therapy WFL for tasks assessed/performed             Past Medical History:  Diagnosis Date   Allergy    Arthritis    Back pain    Chest pain    Diverticulitis    High cholesterol    History of hiatal hernia    Hypertension    Joint pain    LBBB (left bundle branch block)    Migraine    history   Nonobstructive atherosclerosis of coronary artery    Osteoarthritis    Osteoarthritis, hip, bilateral    Past Surgical History:  Procedure Laterality Date   CESAREAN SECTION     x2   CHOLECYSTECTOMY     PARTIAL HYSTERECTOMY     Abdominal   ROTATOR CUFF REPAIR Left    ROTATOR CUFF REPAIR Right    TONSILLECTOMY     removed as a child   TOTAL HIP ARTHROPLASTY Right 05/13/2023   Procedure: RIGHT TOTAL HIP REPLACEMENT;  Surgeon: Jerri Kay HERO, MD;  Location: [facility] OR;  Service: Orthopedics;  Laterality: Right;  3-C   TYMPANOSTOMY TUBE PLACEMENT Right 2022   Patient Active Problem List   Diagnosis Date Noted   Abnormal food appetite 06/09/2024   Osteoporosis 05/16/2024   Insulin  resistance 03/03/2024   At increased risk for cardiovascular disease 03/03/2024   Iron deficiency anemia 02/10/2024   Abnormal glucose 02/10/2024   Statin intolerance - tried several in past, 2-3 years ago 12/16/2023   Tinnitus of both ears 11/04/2023   Status post total replacement of right hip 05/13/2023   Primary osteoarthritis of right hip 05/12/2023   Primary osteoarthritis of left hip 02/06/2023   Statin  myopathy 10/24/2022   Atherosclerosis of aorta 10/17/2020   Nocturnal leg cramps 10/01/2018   Splenic artery aneurysm 03/01/2017   Thyroid  nodule 01/27/2017   Acute diverticulitis 01/24/2017   Recurrent herpes labialis 09/21/2016   Pure hypercholesterolemia 09/05/2016   Class 1 obesity with serious comorbidity and body mass index (BMI) of 31.0 to 31.9 in adult 09/05/2016   Bilateral hip pain 09/14/2015   Medicare annual wellness visit, subsequent 09/14/2015   Sigmoid diverticulitis 01/27/2015   LBBB (left bundle branch block) 12/23/2014   Hematochezia 12/23/2014   Abdominal pain    Diverticulitis of large intestine without perforation or abscess without bleeding    Chest pain    Solitary pulmonary nodule 06/09/2014   Left lower lobe pneumonia 10/06/2013   Urinary incontinence in female 03/02/2013   Postmenopause atrophic vaginitis 03/02/2013   RUQ PAIN 04/01/2007   Essential (primary) hypertension 03/24/2007   Diverticulosis of colon 03/17/2007     REFERRING PROVIDER: Jerri Kay HERO, MD   REFERRING DIAG: [id number] (ICD-10-CM) - Status post total replacement of right hip   THERAPY DIAG:  Pain in right leg  Difficulty in walking, not elsewhere classified  Muscle weakness (generalized)  Rationale for Evaluation and  Treatment: Rehabilitation  ONSET DATE: DOS  05/13/2023  SUBJECTIVE:   SUBJECTIVE STATEMENT:  Its OK, the nerve part is irritated today. When I had hip surgery it damaged a nerve and I took a gabapentin . It gets irritated from exercises     PERTINENT HISTORY: R THA anterior approach on 05/13/23 Osteoporosis OA including L hip Bilat RCR, HTN  PAIN:  2/10 current , 6-7/10 worst pain R groin down to knee Some nerve pain Aggravating motions make it feel worse Stop doing what I'm doing makes it better   PRECAUTIONS: Other: R THA with anterior approach, osteoporosis   WEIGHT BEARING RESTRICTIONS: No  FALLS:  Has patient fallen in last 6 months? Yes.  Number of falls 2.  Trying to stop a soccer ball and bending over trying to move a rug  LIVING ENVIRONMENT: Lives with: lives alone Lives in: 1 story home Stairs: 4 steps to enter home with rails    PLOF: Independent  PATIENT GOALS: improved performance of car transfers and stairs.  To be able to perform stairs normally.  Improve strength.    OBJECTIVE:  Note: Objective measures were completed at Evaluation unless otherwise noted.  DIAGNOSTIC FINDINGS: X rays of R hip:  stable R THR without complication.  PATIENT SURVEYS:  LEFS:  14/80  COGNITION: Overall cognitive status: Within functional limits for tasks assessed      PALPATION: TTP:  R LE:  lateral thigh/ ITB,  mid to distal central thigh   LOWER EXTREMITY ROM:  Active ROM Right eval Left eval  Hip flexion    Hip extension    Hip abduction    Hip adduction    Hip internal rotation    Hip external rotation    Knee flexion 106 122  Knee extension    Ankle dorsiflexion    Ankle plantarflexion    Ankle inversion    Ankle eversion     (Blank rows = not tested)  LOWER EXTREMITY MMT:  MMT Right eval Left eval  Hip flexion Tolerated min resistance With pain; 19.8 5/5; 25.2  Hip extension    Hip abduction 11.2 29.2  Hip adduction    Hip internal rotation    Hip external rotation    Knee flexion 4+/5 with pain 5/5  Knee extension 4-/5 with pain 5/5  Ankle dorsiflexion    Ankle plantarflexion    Ankle inversion    Ankle eversion     (Blank rows = not tested)   FUNCTIONAL TESTS:  5x STS test:  15.76 sec  GAIT:  Comments:  Pt has a heel to toe gait pattern.  She has a minimal limp with gait and doesn't use an AD.                                                                                                                                TREATMENT:   06/18/24 Nustep L3x7 minutes seat 5, all four extremities  Bridge 12x2 second holds with RTB for increased glute activation.  Sideling clams with  RTB, bilat  SLR R LE x8 Quad sets, 5 sec holds.  Hip flexor stretch at counter top  Percussion gun on low vibration setting to R quad  STM to hip flexor   06/16/24 Nustep L3x8 minutes seat 5, all four extremities  Bridge 12x2 second holds SLR R LE x8 SAQs 3# 10x2 second holds (2 round) Supine hip ABD x10 R foot in pillow case Heel slides R x10 foot in pillow case   Percussion gun on low vibration setting to R quad       06/09/24: Reviewed response to prior treatment, HEP compliance, and pain levels.  -STM/IASTM using roller to R quads/adductors in supine with foam roll under knee  -SAQ with 2.5#  with 3-5 sec hold -supine bridge 2x10 -LAQ with 3 sec hold 2x10 -seated hip abd with RTB 2x10     06/02/24: -STM/IASTM using roller to R quads/adductors -quad sets bilateral 3 holds -SAQ 2.5# 5x 10 -LAQ with cues for TKE 3 2x10 -s/l clams 2x10 -HEP update and review  IE Pt performed LAQ 2x10 and supine bridge x 10 reps.  Pt performed supine heel slides though had pain.  Pt received a HEP handout and was educated in correct form and appropriate frequency.    PATIENT EDUCATION:  Education details: dx, exercise form, relevant anatomy, HEP, POC, rationale of interventions, and what to expect next treatment.  PT answered pt's questions.   Person educated: Patient Education method: Explanation, Demonstration, Tactile cues, Verbal cues, and Handouts Education comprehension: verbalized understanding, returned demonstration, verbal cues required, tactile cues required, and needs further education  HOME EXERCISE PROGRAM: Access Code: [email or web address] URL: [email or web address] Date: 05/25/2024 Prepared by: Mose Minerva  Exercises - Seated Long Arc Quad  - 1 x daily - 7 x weekly - 2 sets - 10 reps - Supine Bridge  - 1 x daily - 7 x weekly - 2 sets - 10 reps  ASSESSMENT:  CLINICAL IMPRESSION:  Pt continues to present with limitations due to ongoing pain in her R  hip. She is hypersensitive due to her nerve. She is limited with movement secondary to a pinching in her groin. Educated pt on hip flexor stretch with slight improvement. She departs with improved gait. Pt will continue to benefit from STM to hip flexors. Will continue to progress as able/tolerated.    OBJECTIVE IMPAIRMENTS: Abnormal gait, decreased activity tolerance, decreased endurance, decreased mobility, difficulty walking, decreased ROM, decreased strength, and pain.   ACTIVITY LIMITATIONS: carrying, squatting, stairs, transfers, and locomotion level  PARTICIPATION LIMITATIONS: cleaning, shopping, and community activity  PERSONAL FACTORS: 1-2 comorbidities: osteoporosis, L hip OA are also affecting patient's functional outcome.   REHAB POTENTIAL: Good  CLINICAL DECISION MAKING: Stable/uncomplicated  EVALUATION COMPLEXITY: Low   GOALS:  SHORT TERM GOALS: Target date: 06/15/24  Pt will be independent and compliant with HEP for improved pain, strength, and function.  Baseline: Goal status: INITIAL  2.  Pt will report at least a 25% improvement in pain and sx's overall.   Baseline:  Goal status: INITIAL  3.  Pt will report at least a 50% improvement in lifting her LE for improved dressing and performance of car transfers.  Baseline:  Goal status: INITIAL    LONG TERM GOALS: Target date: 07/06/2024  Pt will ambulate without limping.  Baseline:  Goal status: INITIAL  2.  Pt will be able to perform stairs with a  reciprocal gait with a rail.  Baseline:  Goal status: INITIAL  3.  Pt will be able to perform car transfers without difficulty.  Baseline:  Goal status: INITIAL  4.  Pt will report she is able to ambulate community distance without difficulty and increased pain.  Baseline:  Goal status: INITIAL  5.  Pt will demo improved R knee strength to 5/5 MMT and at least a 10# increase in R hip abd for improved performance of and tolerance with functional mobility.   Baseline:  Goal status: INITIAL    PLAN:  PT FREQUENCY: 2x/week  PT DURATION: 6 weeks  PLANNED INTERVENTIONS: 97164- PT Re-evaluation, 97750- Physical Performance Testing, 97110-Therapeutic exercises, 97530- Therapeutic activity, [id number]- Neuromuscular re-education, 97535- Self Care, 02859- Manual therapy, [id number]- Gait training, [phone]- Aquatic Therapy, [phone]- Electrical stimulation (unattended), 20560 (1-2 muscles), 20561 (3+ muscles)- Dry Needling, Patient/Family education, Balance training, Stair training, Taping, Cryotherapy, and Moist heat  PLAN FOR NEXT SESSION: Review and perform HEP.  R LE strengthening, proprioception, and gait.  Cont with STM/IASTM, percussion gun if this was helpful   Rojean Batten PT, DPT 06/18/24  9:31 AM

## 2024-06-18 ENCOUNTER — Encounter (HOSPITAL_BASED_OUTPATIENT_CLINIC_OR_DEPARTMENT_OTHER): Payer: Self-pay | Admitting: Physical Therapy

## 2024-06-18 ENCOUNTER — Ambulatory Visit (HOSPITAL_BASED_OUTPATIENT_CLINIC_OR_DEPARTMENT_OTHER): Admitting: Physical Therapy

## 2024-06-18 DIAGNOSIS — M79604 Pain in right leg: Secondary | ICD-10-CM

## 2024-06-18 DIAGNOSIS — M6281 Muscle weakness (generalized): Secondary | ICD-10-CM

## 2024-06-18 DIAGNOSIS — R262 Difficulty in walking, not elsewhere classified: Secondary | ICD-10-CM

## 2024-06-24 ENCOUNTER — Encounter (HOSPITAL_BASED_OUTPATIENT_CLINIC_OR_DEPARTMENT_OTHER): Payer: Self-pay | Admitting: Physical Therapy

## 2024-06-24 ENCOUNTER — Ambulatory Visit (HOSPITAL_BASED_OUTPATIENT_CLINIC_OR_DEPARTMENT_OTHER): Admitting: Physical Therapy

## 2024-06-24 DIAGNOSIS — R262 Difficulty in walking, not elsewhere classified: Secondary | ICD-10-CM

## 2024-06-24 DIAGNOSIS — M79604 Pain in right leg: Secondary | ICD-10-CM

## 2024-06-24 DIAGNOSIS — M6281 Muscle weakness (generalized): Secondary | ICD-10-CM

## 2024-06-24 NOTE — Therapy (Signed)
 OUTPATIENT PHYSICAL THERAPY LOWER EXTREMITY TREATMENT   Patient Name: Cheryl Barnes MRN: [id number] DOB:[birth year], 73 y.o., female Today's Date: 06/24/2024  END OF SESSION:  PT End of Session - 06/24/24 1441     Visit Number 6    Number of Visits 12    Date for Recertification  07/06/24    Authorization Type MCR A and B    PT Start Time 1432    PT Stop Time 1512    PT Time Calculation (min) 40 min    Activity Tolerance Patient tolerated treatment well    Behavior During Therapy WFL for tasks assessed/performed              Past Medical History:  Diagnosis Date   Allergy    Arthritis    Back pain    Chest pain    Diverticulitis    High cholesterol    History of hiatal hernia    Hypertension    Joint pain    LBBB (left bundle branch block)    Migraine    history   Nonobstructive atherosclerosis of coronary artery    Osteoarthritis    Osteoarthritis, hip, bilateral    Past Surgical History:  Procedure Laterality Date   CESAREAN SECTION     x2   CHOLECYSTECTOMY     PARTIAL HYSTERECTOMY     Abdominal   ROTATOR CUFF REPAIR Left    ROTATOR CUFF REPAIR Right    TONSILLECTOMY     removed as a child   TOTAL HIP ARTHROPLASTY Right 05/13/2023   Procedure: RIGHT TOTAL HIP REPLACEMENT;  Surgeon: Jerri Kay HERO, MD;  Location: [facility] OR;  Service: Orthopedics;  Laterality: Right;  3-C   TYMPANOSTOMY TUBE PLACEMENT Right 2022   Patient Active Problem List   Diagnosis Date Noted   Abnormal food appetite 06/09/2024   Osteoporosis 05/16/2024   Insulin  resistance 03/03/2024   At increased risk for cardiovascular disease 03/03/2024   Iron deficiency anemia 02/10/2024   Abnormal glucose 02/10/2024   Statin intolerance - tried several in past, 2-3 years ago 12/16/2023   Tinnitus of both ears 11/04/2023   Status post total replacement of right hip 05/13/2023   Primary osteoarthritis of right hip 05/12/2023   Primary osteoarthritis of left hip 02/06/2023   Statin  myopathy 10/24/2022   Atherosclerosis of aorta 10/17/2020   Nocturnal leg cramps 10/01/2018   Splenic artery aneurysm 03/01/2017   Thyroid  nodule 01/27/2017   Acute diverticulitis 01/24/2017   Recurrent herpes labialis 09/21/2016   Pure hypercholesterolemia 09/05/2016   Class 1 obesity with serious comorbidity and body mass index (BMI) of 31.0 to 31.9 in adult 09/05/2016   Bilateral hip pain 09/14/2015   Medicare annual wellness visit, subsequent 09/14/2015   Sigmoid diverticulitis 01/27/2015   LBBB (left bundle branch block) 12/23/2014   Hematochezia 12/23/2014   Abdominal pain    Diverticulitis of large intestine without perforation or abscess without bleeding    Chest pain    Solitary pulmonary nodule 06/09/2014   Left lower lobe pneumonia 10/06/2013   Urinary incontinence in female 03/02/2013   Postmenopause atrophic vaginitis 03/02/2013   RUQ PAIN 04/01/2007   Essential (primary) hypertension 03/24/2007   Diverticulosis of colon 03/17/2007     REFERRING PROVIDER: Jerri Kay HERO, MD   REFERRING DIAG: [phone] (ICD-10-CM) - Status post total replacement of right hip   THERAPY DIAG:  Pain in right leg  Difficulty in walking, not elsewhere classified  Muscle weakness (generalized)  Rationale for Evaluation  and Treatment: Rehabilitation  ONSET DATE: DOS  05/13/2023  SUBJECTIVE:   SUBJECTIVE STATEMENT:  Having a little more soreness in my leg, its not the nerve but more like feeling sore due to the weather. Have been feeling a little sore after PT sessions but it doesn't last long.   PERTINENT HISTORY: R THA anterior approach on 05/13/23 Osteoporosis OA including L hip Bilat RCR, HTN  PAIN:  2/10 current  R groin down to knee/overall Some nerve pain/soreness from the weather  Aggravating motions make it feel worse especially hip ABD  Stop doing what I'm doing makes it better   PRECAUTIONS: Other: R THA with anterior approach, osteoporosis   WEIGHT BEARING  RESTRICTIONS: No  FALLS:  Has patient fallen in last 6 months? Yes. Number of falls 2.  Trying to stop a soccer ball and bending over trying to move a rug  LIVING ENVIRONMENT: Lives with: lives alone Lives in: 1 story home Stairs: 4 steps to enter home with rails    PLOF: Independent  PATIENT GOALS: improved performance of car transfers and stairs.  To be able to perform stairs normally.  Improve strength.    OBJECTIVE:  Note: Objective measures were completed at Evaluation unless otherwise noted.  DIAGNOSTIC FINDINGS: X rays of R hip:  stable R THR without complication.  PATIENT SURVEYS:  LEFS:  14/80  COGNITION: Overall cognitive status: Within functional limits for tasks assessed      PALPATION: TTP:  R LE:  lateral thigh/ ITB,  mid to distal central thigh   LOWER EXTREMITY ROM:  Active ROM Right eval Left eval  Hip flexion    Hip extension    Hip abduction    Hip adduction    Hip internal rotation    Hip external rotation    Knee flexion 106 122  Knee extension    Ankle dorsiflexion    Ankle plantarflexion    Ankle inversion    Ankle eversion     (Blank rows = not tested)  LOWER EXTREMITY MMT:  MMT Right eval Left eval  Hip flexion Tolerated min resistance With pain; 19.8 5/5; 25.2  Hip extension    Hip abduction 11.2 29.2  Hip adduction    Hip internal rotation    Hip external rotation    Knee flexion 4+/5 with pain 5/5  Knee extension 4-/5 with pain 5/5  Ankle dorsiflexion    Ankle plantarflexion    Ankle inversion    Ankle eversion     (Blank rows = not tested)   FUNCTIONAL TESTS:  5x STS test:  15.76 sec  GAIT:  Comments:  Pt has a heel to toe gait pattern.  She has a minimal limp with gait and doesn't use an AD.                                                                                                                                TREATMENT:  06/24/24  Nustep L3x8 minutes seat 5 all four extremities  Goals    Sidelying quad set  + hip flexion x10 R LE Bridges + ABD into red TB x12  HS stretches 2x30 seconds B Piriformis stretches 2x30 seconds B Sidelying clams red TB x10 B Hip hikes x10 B   Education on differences between approaches for hip replacements as well as general reasoning for why some surgeons recommend PT after THR surgery/some do not, encouraged her to call her insurance to see if they would reimburse her for [facility] membership       06/18/24 Nustep L3x7 minutes seat 5, all four extremities  Bridge 12x2 second holds with RTB for increased glute activation.  Sideling clams with RTB, bilat  SLR R LE x8 Quad sets, 5 sec holds.  Hip flexor stretch at counter top  Percussion gun on low vibration setting to R quad  STM to hip flexor   06/16/24 Nustep L3x8 minutes seat 5, all four extremities  Bridge 12x2 second holds SLR R LE x8 SAQs 3# 10x2 second holds (2 round) Supine hip ABD x10 R foot in pillow case Heel slides R x10 foot in pillow case   Percussion gun on low vibration setting to R quad       06/09/24: Reviewed response to prior treatment, HEP compliance, and pain levels.  -STM/IASTM using roller to R quads/adductors in supine with foam roll under knee  -SAQ with 2.5#  with 3-5 sec hold -supine bridge 2x10 -LAQ with 3 sec hold 2x10 -seated hip abd with RTB 2x10     06/02/24: -STM/IASTM using roller to R quads/adductors -quad sets bilateral 3 holds -SAQ 2.5# 5x 10 -LAQ with cues for TKE 3 2x10 -s/l clams 2x10 -HEP update and review  IE Pt performed LAQ 2x10 and supine bridge x 10 reps.  Pt performed supine heel slides though had pain.  Pt received a HEP handout and was educated in correct form and appropriate frequency.    PATIENT EDUCATION:  Education details: dx, exercise form, relevant anatomy, HEP, POC, rationale of interventions, and what to expect next treatment.  PT answered pt's questions.   Person educated: Patient Education  method: Explanation, Demonstration, Tactile cues, Verbal cues, and Handouts Education comprehension: verbalized understanding, returned demonstration, verbal cues required, tactile cues required, and needs further education  HOME EXERCISE PROGRAM:  Access Code: [email or web address] URL: [email or web address] Date: 06/24/2024 Prepared by: Josette Rough  Exercises - Supine Quad Set  - 1-2 x daily - 7 x weekly - 3 sets - 10 reps - 3seconds hold - Seated Long Arc Quad  - 1 x daily - 7 x weekly - 2 sets - 10 reps - Supine Bridge  - 1 x daily - 7 x weekly - 2 sets - 10 reps - Clamshell  - 1-2 x daily - 7 x weekly - 2 sets - 10 reps - Supine Knee Extension Strengthening  - 1-2 x daily - 7 x weekly - 1-2 sets - 10 reps - 5 seconds hold - Sidelying Hip Flexion  - 1 x daily - 7 x weekly - 3 sets - 10 reps     ASSESSMENT:  CLINICAL IMPRESSION:   Arrives today doing well, sounds like she has been tolerating slow progressions in loading OK so far with just a little delayed mm soreness. Spent a little more time on warm up today due to weather related soreness, otherwise progressed interventions as tolerated this visit. Making slow but steady progress towards STGs.  OBJECTIVE IMPAIRMENTS: Abnormal gait, decreased activity tolerance, decreased endurance, decreased mobility, difficulty walking, decreased ROM, decreased strength, and pain.   ACTIVITY LIMITATIONS: carrying, squatting, stairs, transfers, and locomotion level  PARTICIPATION LIMITATIONS: cleaning, shopping, and community activity  PERSONAL FACTORS: 1-2 comorbidities: osteoporosis, L hip OA are also affecting patient's functional outcome.   REHAB POTENTIAL: Good  CLINICAL DECISION MAKING: Stable/uncomplicated  EVALUATION COMPLEXITY: Low   GOALS:  SHORT TERM GOALS: Target date: 06/15/24  Pt will be independent and compliant with HEP for improved pain, strength, and function.  Baseline: Goal status: MET 06/24/24  2.   Pt will report at least a 25% improvement in pain and sx's overall.   Baseline:  Goal status: MET 06/24/24  3.  Pt will report at least a 50% improvement in lifting her LE for improved dressing and performance of car transfers.  Baseline:  Goal status: ONGOING 06/24/24 40-45%     LONG TERM GOALS: Target date: 07/06/2024  Pt will ambulate without limping.  Baseline:  Goal status: INITIAL  2.  Pt will be able to perform stairs with a  reciprocal gait with a rail.  Baseline:  Goal status: INITIAL  3.  Pt will be able to perform car transfers without difficulty.  Baseline:  Goal status: INITIAL  4.  Pt will report she is able to ambulate community distance without difficulty and increased pain.  Baseline:  Goal status: INITIAL  5.  Pt will demo improved R knee strength to 5/5 MMT and at least a 10# increase in R hip abd for improved performance of and tolerance with functional mobility.  Baseline:  Goal status: INITIAL    PLAN:  PT FREQUENCY: 2x/week  PT DURATION: 6 weeks  PLANNED INTERVENTIONS: 97164- PT Re-evaluation, 97750- Physical Performance Testing, 97110-Therapeutic exercises, 97530- Therapeutic activity, [id number]- Neuromuscular re-education, 97535- Self Care, 02859- Manual therapy, [id number]- Gait training, [phone]- Aquatic Therapy, [phone]- Electrical stimulation (unattended), 20560 (1-2 muscles), 20561 (3+ muscles)- Dry Needling, Patient/Family education, Balance training, Stair training, Taping, Cryotherapy, and Moist heat  PLAN FOR NEXT SESSION: Review and perform HEP, progress PRN.  R LE strengthening, proprioception, and gait.  Cont with STM/IASTM, percussion gun if this was helpful  Josette Rough, PT, DPT 06/24/24 3:13 PM

## 2024-06-29 ENCOUNTER — Encounter: Payer: Self-pay | Admitting: Radiology

## 2024-07-01 ENCOUNTER — Encounter (HOSPITAL_BASED_OUTPATIENT_CLINIC_OR_DEPARTMENT_OTHER): Admitting: Physical Therapy

## 2024-07-02 ENCOUNTER — Encounter (HOSPITAL_BASED_OUTPATIENT_CLINIC_OR_DEPARTMENT_OTHER): Payer: Self-pay | Admitting: Physical Therapy

## 2024-07-02 ENCOUNTER — Ambulatory Visit (HOSPITAL_BASED_OUTPATIENT_CLINIC_OR_DEPARTMENT_OTHER): Attending: Orthopaedic Surgery | Admitting: Physical Therapy

## 2024-07-02 DIAGNOSIS — M79604 Pain in right leg: Secondary | ICD-10-CM | POA: Insufficient documentation

## 2024-07-02 DIAGNOSIS — M6281 Muscle weakness (generalized): Secondary | ICD-10-CM | POA: Diagnosis present

## 2024-07-02 DIAGNOSIS — R262 Difficulty in walking, not elsewhere classified: Secondary | ICD-10-CM | POA: Diagnosis present

## 2024-07-02 NOTE — Therapy (Addendum)
 OUTPATIENT PHYSICAL THERAPY LOWER EXTREMITY TREATMENT   Patient Name: Cheryl Barnes MRN: [id number] DOB:07-Feb-1951, 73 y.o., female Today's Date: 07/02/2024  END OF SESSION:  PT End of Session - 07/02/24 1148     Visit Number 7    Number of Visits 12    Date for Recertification  07/06/24    Authorization Type MCR A and B    PT Start Time 1110    PT Stop Time 1155    PT Time Calculation (min) 45 min    Activity Tolerance Patient tolerated treatment well    Behavior During Therapy WFL for tasks assessed/performed               Past Medical History:  Diagnosis Date   Allergy    Arthritis    Back pain    Chest pain    Diverticulitis    High cholesterol    History of hiatal hernia    Hypertension    Joint pain    LBBB (left bundle branch block)    Migraine    history   Nonobstructive atherosclerosis of coronary artery    Osteoarthritis    Osteoarthritis, hip, bilateral    Past Surgical History:  Procedure Laterality Date   CESAREAN SECTION     x2   CHOLECYSTECTOMY     PARTIAL HYSTERECTOMY     Abdominal   ROTATOR CUFF REPAIR Left    ROTATOR CUFF REPAIR Right    TONSILLECTOMY     removed as a child   TOTAL HIP ARTHROPLASTY Right 05/13/2023   Procedure: RIGHT TOTAL HIP REPLACEMENT;  Surgeon: Jerri Kay HERO, MD;  Location: [facility] OR;  Service: Orthopedics;  Laterality: Right;  3-C   TYMPANOSTOMY TUBE PLACEMENT Right 2022   Patient Active Problem List   Diagnosis Date Noted   Abnormal food appetite 06/09/2024   Osteoporosis 05/16/2024   Insulin  resistance 03/03/2024   At increased risk for cardiovascular disease 03/03/2024   Iron deficiency anemia 02/10/2024   Abnormal glucose 02/10/2024   Statin intolerance - tried several in past, 2-3 years ago 12/16/2023   Tinnitus of both ears 11/04/2023   Status post total replacement of right hip 05/13/2023   Primary osteoarthritis of right hip 05/12/2023   Primary osteoarthritis of left hip 02/06/2023   Statin  myopathy 10/24/2022   Atherosclerosis of aorta 10/17/2020   Nocturnal leg cramps 10/01/2018   Splenic artery aneurysm 03/01/2017   Thyroid  nodule 01/27/2017   Acute diverticulitis 01/24/2017   Recurrent herpes labialis 09/21/2016   Pure hypercholesterolemia 09/05/2016   Class 1 obesity with serious comorbidity and body mass index (BMI) of 31.0 to 31.9 in adult 09/05/2016   Bilateral hip pain 09/14/2015   Medicare annual wellness visit, subsequent 09/14/2015   Sigmoid diverticulitis 01/27/2015   LBBB (left bundle branch block) 12/23/2014   Hematochezia 12/23/2014   Abdominal pain    Diverticulitis of large intestine without perforation or abscess without bleeding    Chest pain    Solitary pulmonary nodule 06/09/2014   Left lower lobe pneumonia 10/06/2013   Urinary incontinence in female 03/02/2013   Postmenopause atrophic vaginitis 03/02/2013   RUQ PAIN 04/01/2007   Essential (primary) hypertension 03/24/2007   Diverticulosis of colon 03/17/2007     REFERRING PROVIDER: Jerri Kay HERO, MD   REFERRING DIAG: [phone] (ICD-10-CM) - Status post total replacement of right hip   THERAPY DIAG:  Pain in right leg  Muscle weakness (generalized)  Difficulty in walking, not elsewhere classified  Rationale for  Evaluation and Treatment: Rehabilitation  ONSET DATE: DOS  05/13/2023  SUBJECTIVE:   SUBJECTIVE STATEMENT:  Last week was a bad week due to the rain.  I'm getting back to baseline now.  Pt denies any adverse effects after prior treatment.  Pt states she is improving.  She can lift her leg higher.  Pt had improved with car transfers though she took a step bwds with car transfers last week.  She denies pain currently.  Pt states she is having less pain than when she started and her nerve is feeling better.  Pt reports one of her goals is to improve with performance of stairs.     PERTINENT HISTORY: R THA anterior approach on 05/13/23 Osteoporosis OA including L hip Bilat  RCR, HTN  PAIN:  0/10 current  R groin down to knee/overall Some nerve pain/soreness from the weather  Aggravating motions make it feel worse especially hip ABD  Stop doing what I'm doing makes it better   PRECAUTIONS: Other: R THA with anterior approach, osteoporosis   WEIGHT BEARING RESTRICTIONS: No  FALLS:  Has patient fallen in last 6 months? Yes. Number of falls 2.  Trying to stop a soccer ball and bending over trying to move a rug  LIVING ENVIRONMENT: Lives with: lives alone Lives in: 1 story home Stairs: 4 steps to enter home with rails    PLOF: Independent  PATIENT GOALS: improved performance of car transfers and stairs.  To be able to perform stairs normally.  Improve strength.    OBJECTIVE:  Note: Objective measures were completed at Evaluation unless otherwise noted.  DIAGNOSTIC FINDINGS: X rays of R hip:  stable R THR without complication.  PATIENT SURVEYS:  LEFS:  14/80  COGNITION: Overall cognitive status: Within functional limits for tasks assessed      PALPATION: TTP:  R LE:  lateral thigh/ ITB,  mid to distal central thigh   LOWER EXTREMITY ROM:  Active ROM Right eval Left eval  Hip flexion    Hip extension    Hip abduction    Hip adduction    Hip internal rotation    Hip external rotation    Knee flexion 106 122  Knee extension    Ankle dorsiflexion    Ankle plantarflexion    Ankle inversion    Ankle eversion     (Blank rows = not tested)  LOWER EXTREMITY MMT:  MMT Right eval Left eval  Hip flexion Tolerated min resistance With pain; 19.8 5/5; 25.2  Hip extension    Hip abduction 11.2 29.2  Hip adduction    Hip internal rotation    Hip external rotation    Knee flexion 4+/5 with pain 5/5  Knee extension 4-/5 with pain 5/5  Ankle dorsiflexion    Ankle plantarflexion    Ankle inversion    Ankle eversion     (Blank rows = not tested)   FUNCTIONAL TESTS:  5x STS test:  15.76 sec  GAIT:  Comments:  Pt has a heel to  toe gait pattern.  She has a minimal limp with gait and doesn't use an AD.  TREATMENT:    07/02/24 Nustep L3 x5 minutes seat 5, UE/LEs Manual Therapy:  STM and TPR to R quad and ITB.  Rolling to R quad in supine SAQ 2x10  2.5# Supine bridge with RTB around LE's 2x15 Supine manual HS stretch 2x30 sec bilat S/L clams RTB 2x10  R LE   06/24/24  Nustep L3x8 minutes seat 5 all four extremities  Goals   Sidelying quad set  + hip flexion x10 R LE Bridges + ABD into red TB x12  HS stretches 2x30 seconds B Piriformis stretches 2x30 seconds B Sidelying clams red TB x10 B Hip hikes x10 B   Education on differences between approaches for hip replacements as well as general reasoning for why some surgeons recommend PT after THR surgery/some do not, encouraged her to call her insurance to see if they would reimburse her for [facility] membership       06/18/24 Nustep L3x7 minutes seat 5, all four extremities  Bridge 12x2 second holds with RTB for increased glute activation.  Sideling clams with RTB, bilat  SLR R LE x8 Quad sets, 5 sec holds.  Hip flexor stretch at counter top  Percussion gun on low vibration setting to R quad  STM to hip flexor   06/16/24 Nustep L3x8 minutes seat 5, all four extremities  Bridge 12x2 second holds SLR R LE x8 SAQs 3# 10x2 second holds (2 round) Supine hip ABD x10 R foot in pillow case Heel slides R x10 foot in pillow case   Percussion gun on low vibration setting to R quad       06/09/24: Reviewed response to prior treatment, HEP compliance, and pain levels.  -STM/IASTM using roller to R quads/adductors in supine with foam roll under knee  -SAQ with 2.5#  with 3-5 sec hold -supine bridge 2x10 -LAQ with 3 sec hold 2x10 -seated hip abd with RTB 2x10     06/02/24: -STM/IASTM using roller to R  quads/adductors -quad sets bilateral 3 holds -SAQ 2.5# 5x 10 -LAQ with cues for TKE 3 2x10 -s/l clams 2x10 -HEP update and review  IE Pt performed LAQ 2x10 and supine bridge x 10 reps.  Pt performed supine heel slides though had pain.  Pt received a HEP handout and was educated in correct form and appropriate frequency.    PATIENT EDUCATION:  Education details: dx, exercise form, relevant anatomy, HEP, POC, rationale of interventions, and self soft tissue mobilization.  PT answered pt's questions.   Person educated: Patient Education method: Explanation, Demonstration, Tactile cues, Verbal cues Education comprehension: verbalized understanding, returned demonstration, verbal cues required, tactile cues required, and needs further education  HOME EXERCISE PROGRAM:  Access Code: [email or web address] URL: [email or web address] Date: 06/24/2024 Prepared by: Josette Rough  Exercises - Supine Quad Set  - 1-2 x daily - 7 x weekly - 3 sets - 10 reps - 3seconds hold - Seated Long Arc Quad  - 1 x daily - 7 x weekly - 2 sets - 10 reps - Supine Bridge  - 1 x daily - 7 x weekly - 2 sets - 10 reps - Clamshell  - 1-2 x daily - 7 x weekly - 2 sets - 10 reps - Supine Knee Extension Strengthening  - 1-2 x daily - 7 x weekly - 1-2 sets - 10 reps - 5 seconds hold - Sidelying Hip Flexion  - 1 x daily - 7 x weekly - 3 sets - 10 reps     ASSESSMENT:  CLINICAL IMPRESSION:  Pt is making progress with pain, sx's, and function as evidenced by subjective reports.  Pt reports improved pain and nerve sx's.  Pt continues to have tenderness and soft tissue tightness in quad and ITB.  Her tolerance to [facility] is improving.  PT instructed pt in performing self soft tissue mobilization at home.  Pt performed exercises well with cuing for correct form.  She responded well to treatment reporting no pain and no tightness after treatment.  She should benefit from cont skilled PT to address impairments and goals  and to improve overall function.    OBJECTIVE IMPAIRMENTS: Abnormal gait, decreased activity tolerance, decreased endurance, decreased mobility, difficulty walking, decreased ROM, decreased strength, and pain.   ACTIVITY LIMITATIONS: carrying, squatting, stairs, transfers, and locomotion level  PARTICIPATION LIMITATIONS: cleaning, shopping, and community activity  PERSONAL FACTORS: 1-2 comorbidities: osteoporosis, L hip OA are also affecting patient's functional outcome.   REHAB POTENTIAL: Good  CLINICAL DECISION MAKING: Stable/uncomplicated  EVALUATION COMPLEXITY: Low   GOALS:  SHORT TERM GOALS: Target date: 06/15/24  Pt will be independent and compliant with HEP for improved pain, strength, and function.  Baseline: Goal status: MET 06/24/24  2.  Pt will report at least a 25% improvement in pain and sx's overall.   Baseline:  Goal status: MET 06/24/24  3.  Pt will report at least a 50% improvement in lifting her LE for improved dressing and performance of car transfers.  Baseline:  Goal status: ONGOING 06/24/24 40-45%     LONG TERM GOALS: Target date: 07/06/2024  Pt will ambulate without limping.  Baseline:  Goal status: INITIAL  2.  Pt will be able to perform stairs with a  reciprocal gait with a rail.  Baseline:  Goal status: INITIAL  3.  Pt will be able to perform car transfers without difficulty.  Baseline:  Goal status: INITIAL  4.  Pt will report she is able to ambulate community distance without difficulty and increased pain.  Baseline:  Goal status: INITIAL  5.  Pt will demo improved R knee strength to 5/5 MMT and at least a 10# increase in R hip abd for improved performance of and tolerance with functional mobility.  Baseline:  Goal status: INITIAL    PLAN:  PT FREQUENCY: 2x/week  PT DURATION: 6 weeks  PLANNED INTERVENTIONS: [id number]- PT Re-evaluation, 97750- Physical Performance Testing, 97110-Therapeutic exercises, 97530- Therapeutic  activity, [id number]- Neuromuscular re-education, 97535- Self Care, [id number]- Manual therapy, [id number]- Gait training, [id number]- Aquatic Therapy, [phone]- Electrical stimulation (unattended), 20560 (1-2 muscles), 20561 (3+ muscles)- Dry Needling, Patient/Family education, Balance training, Stair training, Taping, Cryotherapy, and Moist heat  PLAN FOR NEXT SESSION: Review and perform HEP, progress PRN.  R LE strengthening, proprioception, and gait.  Cont with STM/IASTM, percussion gun if this was helpful  Leigh Minerva III PT, DPT 07/02/24 8:52 PM

## 2024-07-07 ENCOUNTER — Ambulatory Visit (HOSPITAL_BASED_OUTPATIENT_CLINIC_OR_DEPARTMENT_OTHER): Admitting: Physical Therapy

## 2024-07-07 ENCOUNTER — Encounter (HOSPITAL_BASED_OUTPATIENT_CLINIC_OR_DEPARTMENT_OTHER): Payer: Self-pay | Admitting: Physical Therapy

## 2024-07-07 DIAGNOSIS — M6281 Muscle weakness (generalized): Secondary | ICD-10-CM

## 2024-07-07 DIAGNOSIS — M79604 Pain in right leg: Secondary | ICD-10-CM

## 2024-07-07 DIAGNOSIS — R262 Difficulty in walking, not elsewhere classified: Secondary | ICD-10-CM

## 2024-07-07 NOTE — Therapy (Signed)
 OUTPATIENT PHYSICAL THERAPY LOWER EXTREMITY TREATMENT  Progress Note Reporting Period 05/25/2024 to 07/07/2024  See note below for Objective Data and Assessment of Progress/Goals.       Patient Name: Cheryl Barnes MRN: [id number] DOB:March 15, 1951, 73 y.o., female Today's Date: 07/08/2024  END OF SESSION:  PT End of Session - 07/07/24 1111     Visit Number 8    Number of Visits 18    Date for Recertification  08/18/24    Authorization Type MCR A and B    PT Start Time 1110    PT Stop Time 1155    PT Time Calculation (min) 45 min    Activity Tolerance Patient tolerated treatment well    Behavior During Therapy WFL for tasks assessed/performed                Past Medical History:  Diagnosis Date   Allergy    Arthritis    Back pain    Chest pain    Diverticulitis    High cholesterol    History of hiatal hernia    Hypertension    Joint pain    LBBB (left bundle branch block)    Migraine    history   Nonobstructive atherosclerosis of coronary artery    Osteoarthritis    Osteoarthritis, hip, bilateral    Past Surgical History:  Procedure Laterality Date   CESAREAN SECTION     x2   CHOLECYSTECTOMY     PARTIAL HYSTERECTOMY     Abdominal   ROTATOR CUFF REPAIR Left    ROTATOR CUFF REPAIR Right    TONSILLECTOMY     removed as a child   TOTAL HIP ARTHROPLASTY Right 05/13/2023   Procedure: RIGHT TOTAL HIP REPLACEMENT;  Surgeon: Jerri Kay HERO, MD;  Location: [facility] OR;  Service: Orthopedics;  Laterality: Right;  3-C   TYMPANOSTOMY TUBE PLACEMENT Right 2022   Patient Active Problem List   Diagnosis Date Noted   Abnormal food appetite 06/09/2024   Osteoporosis 05/16/2024   Insulin  resistance 03/03/2024   At increased risk for cardiovascular disease 03/03/2024   Iron deficiency anemia 02/10/2024   Abnormal glucose 02/10/2024   Statin intolerance - tried several in past, 2-3 years ago 12/16/2023   Tinnitus of both ears 11/04/2023   Status post total  replacement of right hip 05/13/2023   Primary osteoarthritis of right hip 05/12/2023   Primary osteoarthritis of left hip 02/06/2023   Statin myopathy 10/24/2022   Atherosclerosis of aorta 10/17/2020   Nocturnal leg cramps 10/01/2018   Splenic artery aneurysm 03/01/2017   Thyroid  nodule 01/27/2017   Acute diverticulitis 01/24/2017   Recurrent herpes labialis 09/21/2016   Pure hypercholesterolemia 09/05/2016   Class 1 obesity with serious comorbidity and body mass index (BMI) of 31.0 to 31.9 in adult 09/05/2016   Bilateral hip pain 09/14/2015   Medicare annual wellness visit, subsequent 09/14/2015   Sigmoid diverticulitis 01/27/2015   LBBB (left bundle branch block) 12/23/2014   Hematochezia 12/23/2014   Abdominal pain    Diverticulitis of large intestine without perforation or abscess without bleeding    Chest pain    Solitary pulmonary nodule 06/09/2014   Left lower lobe pneumonia 10/06/2013   Urinary incontinence in female 03/02/2013   Postmenopause atrophic vaginitis 03/02/2013   RUQ PAIN 04/01/2007   Essential (primary) hypertension 03/24/2007   Diverticulosis of colon 03/17/2007     REFERRING PROVIDER: Jerri Kay HERO, MD   REFERRING DIAG: [phone] (ICD-10-CM) - Status post total replacement of right  hip   THERAPY DIAG:  Pain in right leg  Muscle weakness (generalized)  Difficulty in walking, not elsewhere classified  Rationale for Evaluation and Treatment: Rehabilitation  ONSET DATE: DOS  05/13/2023  SUBJECTIVE:   SUBJECTIVE STATEMENT:  I can definitely tell it's (therapy) helped.  It feels a lot better.  Pt feels that she has a different baseline now. Pt states the massaging the area has helped the sensitivity.  Pt reports improved performance of car transfers though still need to work on it.  Stairs are a little better.  Pt states she is better with smaller steps.  She has difficulty with normal size steps.  She feels her leg wants to buckle when applying weight  on R LE with ascending stairs.   Pt reports a 50-60% improvement in lifting her LE.  Pt states her R LE feels much lighter and it's easier to move after performing her exercises and after PT treatment.  Pt states she felt fine after prior treatment.  Pt reports compliance with HEP.     PERTINENT HISTORY: R THA anterior approach on 05/13/23 Osteoporosis OA including L hip Bilat RCR, HTN  PAIN:  0/10 current, 2/10 with movement, 4-5/10 worst Worst pain is moving leg up and out R groin.  She is not having as much pain in thigh.     PRECAUTIONS: Other: R THA with anterior approach, osteoporosis   WEIGHT BEARING RESTRICTIONS: No  FALLS:  Has patient fallen in last 6 months? Yes. Number of falls 2.  Trying to stop a soccer ball and bending over trying to move a rug  LIVING ENVIRONMENT: Lives with: lives alone Lives in: 1 story home Stairs: 4 steps to enter home with rails    PLOF: Independent  PATIENT GOALS: improved performance of car transfers and stairs.  To be able to perform stairs normally.  Improve strength.    OBJECTIVE:  Note: Objective measures were completed at Evaluation unless otherwise noted.  DIAGNOSTIC FINDINGS: X rays of R hip:  stable R THR without complication.  PATIENT SURVEYS:  LEFS:  14/80 (Initial) LEFS:   41/80 on 07/07/24  COGNITION: Overall cognitive status: Within functional limits for tasks assessed      PALPATION: TTP:  R LE:  lateral thigh/ ITB,  mid to distal central thigh   LOWER EXTREMITY ROM:  Active ROM Right eval Left eval Right 11/11  Hip flexion     Hip extension     Hip abduction     Hip adduction     Hip internal rotation     Hip external rotation     Knee flexion 106 122 116  Knee extension     Ankle dorsiflexion     Ankle plantarflexion     Ankle inversion     Ankle eversion      (Blank rows = not tested)  LOWER EXTREMITY MMT:  MMT Right eval Left eval Right 11/11  Hip flexion Tolerated min resistance  With pain; 19.8 5/5; 25.2 5/5 24.7  Hip extension     Hip abduction 11.2 29.2 21.4  Hip adduction     Hip internal rotation     Hip external rotation     Knee flexion 4+/5 with pain 5/5 5/5  Knee extension 4-/5 with pain 5/5 4/5  Ankle dorsiflexion     Ankle plantarflexion     Ankle inversion     Ankle eversion      (Blank rows = not tested)   FUNCTIONAL TESTS:  5x STS test:  15.76 sec (initial) 5x STS test:  07/07/24:  10.11 sec  GAIT:  Comments:  Pt has a heel to toe gait pattern.  Improved quality of gait with reduced limp.  Pt favors R LE though had no significant limp.                                                                                                                                 TREATMENT:    11/11 Reviewed current function, pain levels, response to prior treatment, and HEP compliance. PT assessed strength, knee AROM, gait, and 5x STS test.  See above. Pt completed LEFS.  See above.  Manual Therapy:  STM and TPR to R quad and ITB. Supine bridge with RTB around LE's 2x15  Stairs:  step to gait pattern with rail ascending and descending stairs.  Attempted a step through pattern with ascending steps though had difficulty and used bilat hands on rail.    07/02/24 Nustep L3 x5 minutes seat 5, UE/LEs Manual Therapy:  STM and TPR to R quad and ITB.  Rolling to R quad in supine SAQ 2x10  2.5# Supine bridge with RTB around LE's 2x15 Supine manual HS stretch 2x30 sec bilat S/L clams RTB 2x10  R LE   06/24/24  Nustep L3x8 minutes seat 5 all four extremities  Goals   Sidelying quad set  + hip flexion x10 R LE Bridges + ABD into red TB x12  HS stretches 2x30 seconds B Piriformis stretches 2x30 seconds B Sidelying clams red TB x10 B Hip hikes x10 B   Education on differences between approaches for hip replacements as well as general reasoning for why some surgeons recommend PT after THR surgery/some do not, encouraged her to call her insurance to see  if they would reimburse her for [facility] membership       06/18/24 Nustep L3x7 minutes seat 5, all four extremities  Bridge 12x2 second holds with RTB for increased glute activation.  Sideling clams with RTB, bilat  SLR R LE x8 Quad sets, 5 sec holds.  Hip flexor stretch at counter top  Percussion gun on low vibration setting to R quad  STM to hip flexor   06/16/24 Nustep L3x8 minutes seat 5, all four extremities  Bridge 12x2 second holds SLR R LE x8 SAQs 3# 10x2 second holds (2 round) Supine hip ABD x10 R foot in pillow case Heel slides R x10 foot in pillow case   Percussion gun on low vibration setting to R quad       06/09/24: Reviewed response to prior treatment, HEP compliance, and pain levels.  -STM/IASTM using roller to R quads/adductors in supine with foam roll under knee  -SAQ with 2.5#  with 3-5 sec hold -supine bridge 2x10 -LAQ with 3 sec hold 2x10 -seated hip abd with RTB 2x10   PATIENT EDUCATION:  Education details: dx, exercise form, relevant anatomy, HEP, POC, rationale  of interventions, objective findings, and goal progress  PT answered pt's questions.   Person educated: Patient Education method: Explanation, Demonstration, Tactile cues, Verbal cues Education comprehension: verbalized understanding, returned demonstration, verbal cues required, tactile cues required, and needs further education  HOME EXERCISE PROGRAM:  Access Code: [email or web address] URL: [email or web address] Date: 06/24/2024 Prepared by: Josette Rough  Exercises - Supine Quad Set  - 1-2 x daily - 7 x weekly - 3 sets - 10 reps - 3seconds hold - Seated Long Arc Quad  - 1 x daily - 7 x weekly - 2 sets - 10 reps - Supine Bridge  - 1 x daily - 7 x weekly - 2 sets - 10 reps - Clamshell  - 1-2 x daily - 7 x weekly - 2 sets - 10 reps - Supine Knee Extension Strengthening  - 1-2 x daily - 7 x weekly - 1-2 sets - 10 reps - 5 seconds hold - Sidelying Hip Flexion  - 1 x daily - 7  x weekly - 3 sets - 10 reps     ASSESSMENT:  CLINICAL IMPRESSION:   Pt is progressing well with pain, sx's, strength, and function as evidenced by subjective reports and objective findings.  Pt reports a 50-60% improvement in lifting her LE.  Pt reports improved performance of car transfers though still needs to work on it.  Pt continues to have difficulty with stairs.  She demonstrates improved quality of gait with reduced limp.  Pt favors R LE though had no significant limp.  Pt demonstrates improved strength t/o R LE as evidenced by MMT and HHD testing.  She also has improved knee flexion AROM by 10 deg.  Pt demonstrates improved functional LE strength with improved performance of 5x STS test by 5 seconds.  Pt demonstrates clinically significant improvement in self perceived disability with LEFS improving from 14/80 initially to 41/80 currently.  She met all of her STG's and partially met LTG #5.  She should benefit from cont skilled PT to address impairments and goals and to improve overall function.    OBJECTIVE IMPAIRMENTS: Abnormal gait, decreased activity tolerance, decreased endurance, decreased mobility, difficulty walking, decreased ROM, decreased strength, and pain.   ACTIVITY LIMITATIONS: carrying, squatting, stairs, transfers, and locomotion level  PARTICIPATION LIMITATIONS: cleaning, shopping, and community activity  PERSONAL FACTORS: 1-2 comorbidities: osteoporosis, L hip OA are also affecting patient's functional outcome.   REHAB POTENTIAL: Good  CLINICAL DECISION MAKING: Stable/uncomplicated  EVALUATION COMPLEXITY: Low   GOALS:  SHORT TERM GOALS: Target date: 06/15/24  Pt will be independent and compliant with HEP for improved pain, strength, and function.  Baseline: Goal status: MET 06/24/24  2.  Pt will report at least a 25% improvement in pain and sx's overall.   Baseline:  Goal status: MET 06/24/24  3.  Pt will report at least a 50% improvement in lifting  her LE for improved dressing and performance of car transfers.  Baseline:  Goal status:  GOAL MET  11/11     LONG TERM GOALS: Target date: 08/18/2024  Pt will ambulate without limping.  Baseline:  Goal status: PROGRESSING  2.  Pt will be able to perform stairs with a  reciprocal gait with a rail.  Baseline:  Goal status: NOT MET  3.  Pt will be able to perform car transfers without difficulty.  Baseline:  Goal status:  PROGRESSING  4.  Pt will report she is able to ambulate community distance without difficulty and increased pain.  Baseline:  Goal status: ONGOING  5.  Pt will demo improved R knee strength to 5/5 MMT and at least a 10# increase in R hip abd for improved performance of and tolerance with functional mobility.  Baseline:  Goal status: 50% MET    PLAN:  PT FREQUENCY: 2x/week  PT DURATION: 5-6 weeks  PLANNED INTERVENTIONS: 97164- PT Re-evaluation, 97750- Physical Performance Testing, 97110-Therapeutic exercises, 97530- Therapeutic activity, [id number]- Neuromuscular re-education, 97535- Self Care, [id number]- Manual therapy, [id number]- Gait training, [phone]- Aquatic Therapy, [phone]- Electrical stimulation (unattended), 20560 (1-2 muscles), 20561 (3+ muscles)- Dry Needling, Patient/Family education, Balance training, Stair training, Taping, Cryotherapy, and Moist heat  PLAN FOR NEXT SESSION: Review and perform HEP, progress PRN.  R LE strengthening, proprioception, and gait.  Cont with STM/IASTM, percussion gun if this was helpful.  Perform step ups on a low step next visit.   Leigh Minerva III PT, DPT 07/08/24 5:10 PM

## 2024-07-08 ENCOUNTER — Ambulatory Visit (INDEPENDENT_AMBULATORY_CARE_PROVIDER_SITE_OTHER): Payer: Self-pay | Admitting: Internal Medicine

## 2024-07-08 VITALS — BP 133/83 | HR 69 | Temp 98.1°F | Ht 60.0 in | Wt 155.0 lb

## 2024-07-08 DIAGNOSIS — Z6831 Body mass index (BMI) 31.0-31.9, adult: Secondary | ICD-10-CM | POA: Diagnosis not present

## 2024-07-08 DIAGNOSIS — E88819 Insulin resistance, unspecified: Secondary | ICD-10-CM | POA: Diagnosis not present

## 2024-07-08 DIAGNOSIS — I10 Essential (primary) hypertension: Secondary | ICD-10-CM | POA: Diagnosis not present

## 2024-07-08 DIAGNOSIS — E66811 Obesity, class 1: Secondary | ICD-10-CM | POA: Diagnosis not present

## 2024-07-08 MED ORDER — METFORMIN HCL ER 500 MG PO TB24: 500.0000 mg | ORAL_TABLET | Freq: Two times a day (BID) | ORAL | 0 refills | Status: DC

## 2024-07-08 NOTE — Assessment & Plan Note (Signed)
 Vitals:   07/08/24 1000  BP: 133/83    Blood pressure is at goal for age and risk category.  On olmesartan  and amlodipine  without adverse effects.  Most recent renal parameters reviewed which showed stable electrolytes and kidney function.  Continue current regimen

## 2024-07-08 NOTE — Assessment & Plan Note (Signed)
 We again reviewed the challenges posed by aging and weight management considering that she has a basal metabolic rate of 8799 she is having a difficult time generating a calorie deficit with modest calorie reduction and increasing levels of physical activity. BMI of 30 and body fat percentage of 43%. Exercise regimen includes physical therapy and 30 minutes of exercise five days a week, improving balance and strength. Current metformin  dose is 500 mg once daily. Discussed options for weight loss including increasing metformin , using meal replacements, and considering GLP-1 medication (Victoza). Discussed potential side effects of increased metformin , including loose stools. Emphasized the importance of reducing caloric intake below 1200 calories for weight loss. Encouraged consistent use of meal replacements and intermittent fasting strategies. - Increased metformin  to 500 mg twice daily for 4 weeks for insulin  resistance and also weight management - Use meal replacement for lunch consistently for two weeks. - Consider reducing daily caloric intake to 900 calories. - Continue physical therapy and exercise regimen. -Continue 16:8 Intermittent fasting schedule - Scheduled follow-up in four weeks.

## 2024-07-08 NOTE — Progress Notes (Signed)
 Office: [phone]  /  Fax: [phone]  Weight Summary and Body Composition Analysis (BIA)  Vitals Temp: 98.1 F (36.7 C) BP: 133/83 Pulse Rate: 69 SpO2: 96 %   Anthropometric Measurements Height: 5' (1.524 m) Weight: 155 lb (70.3 kg) BMI (Calculated): 30.27 Weight at Last Visit: 154 lb Weight Lost Since Last Visit: 0 lb Weight Gained Since Last Visit: 1 lb Starting Weight: 163 lb Total Weight Loss (lbs): 8 lb (3.629 kg) Peak Weight: 165 lb   Body Composition  Body Fat %: 43.4 % Fat Mass (lbs): 67.4 lbs Muscle Mass (lbs): 83.4 lbs Total Body Water (lbs): 60.2 lbs Visceral Fat Rating : 12    RMR: 1325  Today's Visit #: 6  Starting Date: 02/09/24   Subjective   Chief Complaint: Obesity  Interval History Discussed the use of AI scribe software for clinical note transcription with the patient, who gave verbal consent to proceed.  History of Present Illness Cheryl Barnes is a 73 year old female with insulin  resistance and high blood pressure who presents for obesity and weight management.  She is currently adhering to a 1000 calorie diet and engages in physical exercise five days a week for 30 minutes. She reports that her balance has improved and she feels stronger since starting physical therapy at [facility].  Her weight has fluctuated slightly; she was 154 pounds at her last visit and is now 155 pounds. She has increased her intake of fruits and vegetables over the past four weeks, with physical activity levels remaining consistent.  She is taking metformin  500 mg once daily for insulin  resistance. She practices intermittent fasting with a 16:8 schedule and occasionally uses meal replacements for lunch, though not consistently.     Challenges affecting patient progress: menopause and inadequate response to nutritional and behavioral strategies.    Pharmacotherapy for weight management: She is currently taking Metformin  (off label use for weight  management and / or insulin  resistance and / or diabetes prevention) with waning clinical response and without side effects..   Assessment and Plan   Treatment Plan For Obesity:  Recommended Dietary Goals  [name] is currently in the action stage of change. As such, her goal is to continue weight management plan. She has agreed to: keep a food journal with a target of  900 calories per day and 75 grams of protein per day or  25 grams per meal., incorporate 1-2 meal replacements a day for convenience , continue current plan, and incorporate time restricted eating 16 hours fasting with an 8 hour eating window while maintaining reduced calorie nutrition plan  Behavioral Health and Counseling  We discussed the following behavioral modification strategies today: continue to work on maintaining a reduced calorie state, getting the recommended amount of protein, incorporating whole foods, making healthy choices, staying well hydrated and practicing mindfulness when eating. and increase protein intake, fibrous foods (25 grams per day for women, 30 grams for men) and water to improve satiety and decrease hunger signals. .  Additional education and resources provided today: Handout on traveling and holiday eating strategies  Recommended Physical Activity Goals  [name] has been advised to work up to 150 minutes of moderate intensity aerobic activity a week and strengthening exercises 2-3 times per week for cardiovascular health, weight loss maintenance and preservation of muscle mass.  She has agreed to :  Increase volume of physical activity to a goal of 240 minutes a week and Combine aerobic and strengthening exercises for efficiency and improved  cardiometabolic health.  Medical Interventions and Pharmacotherapy  We discussed various medication options to help [name] with her weight loss efforts and we both agreed to : Increase metformin  to twice daily for a month  Associated Conditions  Impacted by Obesity Treatment  Assessment & Plan Class 1 obesity with serious comorbidity and body mass index (BMI) of 31.0 to 31.9 in adult, unspecified obesity type Insulin  resistance We again reviewed the challenges posed by aging and weight management considering that she has a basal metabolic rate of 8799 she is having a difficult time generating a calorie deficit with modest calorie reduction and increasing levels of physical activity. BMI of 30 and body fat percentage of 43%. Exercise regimen includes physical therapy and 30 minutes of exercise five days a week, improving balance and strength. Current metformin  dose is 500 mg once daily. Discussed options for weight loss including increasing metformin , using meal replacements, and considering GLP-1 medication (Victoza). Discussed potential side effects of increased metformin , including loose stools. Emphasized the importance of reducing caloric intake below 1200 calories for weight loss. Encouraged consistent use of meal replacements and intermittent fasting strategies. - Increased metformin  to 500 mg twice daily for 4 weeks for insulin  resistance and also weight management - Use meal replacement for lunch consistently for two weeks. - Consider reducing daily caloric intake to 900 calories. - Continue physical therapy and exercise regimen. -Continue 16:8 Intermittent fasting schedule - Scheduled follow-up in four weeks. Essential (primary) hypertension Vitals:   07/08/24 1000  BP: 133/83    Blood pressure is at goal for age and risk category.  On olmesartan  and amlodipine  without adverse effects.  Most recent renal parameters reviewed which showed stable electrolytes and kidney function.  Continue current regimen       Objective   Physical Exam:  Blood pressure 133/83, pulse 69, temperature 98.1 F (36.7 C), height 5' (1.524 m), weight 155 lb (70.3 kg), SpO2 96%. Body mass index is 30.27 kg/m.  General: She is overweight,  cooperative, alert, well developed, and in no acute distress. PSYCH: Has normal mood, affect and thought process.   HEENT: EOMI, sclerae are anicteric. Lungs: Normal breathing effort, no conversational dyspnea. Extremities: No edema.  Neurologic: No gross sensory or motor deficits. No tremors or fasciculations noted.    Diagnostic Data Reviewed:  BMET    Component Value Date/Time   NA 143 11/04/2023 1053   NA 142 11/25/2020 1023   K 3.8 11/04/2023 1053   CL 108 11/04/2023 1053   CO2 26 11/04/2023 1053   GLUCOSE 102 (H) 11/04/2023 1053   BUN 17 11/04/2023 1053   BUN 17 11/25/2020 1023   CREATININE 0.71 11/04/2023 1053   CALCIUM  9.7 11/04/2023 1053   GFRNONAA >60 05/22/2023 0654   GFRAA >60 05/21/2017 1834   Lab Results  Component Value Date   HGBA1C 5.3 11/04/2023   HGBA1C 5.5 12/19/2014   Lab Results  Component Value Date   INSULIN  17.2 02/10/2024   Lab Results  Component Value Date   TSH 0.58 10/24/2022   CBC    Component Value Date/Time   WBC 4.5 02/10/2024 0935   WBC 5.1 05/22/2023 0654   RBC 4.85 02/10/2024 0935   RBC 3.13 (L) 05/22/2023 0654   HGB 14.6 02/10/2024 0935   HCT 45.7 02/10/2024 0935   PLT 171 02/10/2024 0935   MCV 94 02/10/2024 0935   MCH 30.1 02/10/2024 0935   MCH 31.0 05/22/2023 0654   MCHC 31.9 02/10/2024 0935   MCHC  32.9 05/22/2023 0654   RDW 12.6 02/10/2024 0935   Iron Studies    Component Value Date/Time   IRON 78 02/10/2024 0935   TIBC 320 02/10/2024 0935   FERRITIN 141 02/10/2024 0935   IRONPCTSAT 24 02/10/2024 0935   Lipid Panel     Component Value Date/Time   CHOL 203 (H) 11/04/2023 1053   TRIG 147.0 11/04/2023 1053   HDL 57.00 11/04/2023 1053   CHOLHDL 4 11/04/2023 1053   VLDL 29.4 11/04/2023 1053   LDLCALC 117 (H) 11/04/2023 1053   LDLDIRECT 120.6 08/22/2011 0821   Hepatic Function Panel     Component Value Date/Time   PROT 7.7 11/04/2023 1053   ALBUMIN 4.8 11/04/2023 1053   AST 20 11/04/2023 1053   ALT 19  11/04/2023 1053   ALKPHOS 116 11/04/2023 1053   BILITOT 0.6 11/04/2023 1053   BILIDIR 0.1 08/29/2016 0928   IBILI NOT CALCULATED 09/30/2013 1427      Component Value Date/Time   TSH 0.58 10/24/2022 0839   Nutritional Lab Results  Component Value Date   VD25OH 32.6 02/10/2024    Medications: Outpatient Encounter Medications as of 07/08/2024  Medication Sig Note   amLODipine -olmesartan  (AZOR ) 5-40 MG tablet Take 1 tablet by mouth daily.    cetirizine (ZYRTEC) 10 MG tablet Take 10 mg by mouth every evening.    gabapentin  (NEURONTIN ) 100 MG capsule Take 1 capsule (100 mg total) by mouth 3 (three) times daily as needed.    metFORMIN  (GLUCOPHAGE -XR) 500 MG 24 hr tablet Take 1 tablet (500 mg total) by mouth 2 (two) times daily with a meal.    valACYclovir  (VALTREX ) 1000 MG tablet Take 2 tablets by mouth as needed upon acute onset and asap repeat dose in 12 hours 05/22/2023: Patient states that pharmacy was told that she could not take this medication with her Oxycodone . Patient would like to know if this is a contradiction.   [DISCONTINUED] metFORMIN  (GLUCOPHAGE -XR) 500 MG 24 hr tablet Take 1 tablet (500 mg total) by mouth daily with breakfast.    No facility-administered encounter medications on file as of 07/08/2024.     Follow-Up   Return in about 4 weeks (around 08/05/2024) for For Weight Mangement with Dr. Francyne.SABRA She was informed of the importance of frequent follow up visits to maximize her success with intensive lifestyle modifications for her multiple health conditions.  Attestation Statement   Reviewed by clinician on day of visit: allergies, medications, problem list, medical history, surgical history, family history, social history, and previous encounter notes.     Lucas Francyne, MD

## 2024-07-09 ENCOUNTER — Ambulatory Visit (HOSPITAL_BASED_OUTPATIENT_CLINIC_OR_DEPARTMENT_OTHER): Admitting: Physical Therapy

## 2024-07-09 ENCOUNTER — Encounter (HOSPITAL_BASED_OUTPATIENT_CLINIC_OR_DEPARTMENT_OTHER): Payer: Self-pay | Admitting: Physical Therapy

## 2024-07-09 DIAGNOSIS — R262 Difficulty in walking, not elsewhere classified: Secondary | ICD-10-CM

## 2024-07-09 DIAGNOSIS — M6281 Muscle weakness (generalized): Secondary | ICD-10-CM

## 2024-07-09 DIAGNOSIS — M79604 Pain in right leg: Secondary | ICD-10-CM

## 2024-07-09 NOTE — Therapy (Signed)
 OUTPATIENT PHYSICAL THERAPY LOWER EXTREMITY TREATMENT       Patient Name: Cheryl Barnes MRN: [id number] DOB:[birth year], 73 y.o., female Today's Date: 07/09/2024  END OF SESSION:  PT End of Session - 07/09/24 0942     Visit Number 9    Number of Visits 18    Date for Recertification  08/18/24    Authorization Type MCR A and B    PT Start Time 0936    PT Stop Time 1015    PT Time Calculation (min) 39 min    Activity Tolerance Patient tolerated treatment well    Behavior During Therapy WFL for tasks assessed/performed                Past Medical History:  Diagnosis Date   Allergy    Arthritis    Back pain    Chest pain    Diverticulitis    High cholesterol    History of hiatal hernia    Hypertension    Joint pain    LBBB (left bundle branch block)    Migraine    history   Nonobstructive atherosclerosis of coronary artery    Osteoarthritis    Osteoarthritis, hip, bilateral    Past Surgical History:  Procedure Laterality Date   CESAREAN SECTION     x2   CHOLECYSTECTOMY     PARTIAL HYSTERECTOMY     Abdominal   ROTATOR CUFF REPAIR Left    ROTATOR CUFF REPAIR Right    TONSILLECTOMY     removed as a child   TOTAL HIP ARTHROPLASTY Right 05/13/2023   Procedure: RIGHT TOTAL HIP REPLACEMENT;  Surgeon: Jerri Kay HERO, MD;  Location: [facility] OR;  Service: Orthopedics;  Laterality: Right;  3-C   TYMPANOSTOMY TUBE PLACEMENT Right 2022   Patient Active Problem List   Diagnosis Date Noted   Abnormal food appetite 06/09/2024   Osteoporosis 05/16/2024   Insulin  resistance 03/03/2024   At increased risk for cardiovascular disease 03/03/2024   Iron deficiency anemia 02/10/2024   Abnormal glucose 02/10/2024   Statin intolerance - tried several in past, 2-3 years ago 12/16/2023   Tinnitus of both ears 11/04/2023   Status post total replacement of right hip 05/13/2023   Primary osteoarthritis of right hip 05/12/2023   Primary osteoarthritis of left hip 02/06/2023    Statin myopathy 10/24/2022   Atherosclerosis of aorta 10/17/2020   Nocturnal leg cramps 10/01/2018   Splenic artery aneurysm 03/01/2017   Thyroid  nodule 01/27/2017   Acute diverticulitis 01/24/2017   Recurrent herpes labialis 09/21/2016   Pure hypercholesterolemia 09/05/2016   Class 1 obesity with serious comorbidity and body mass index (BMI) of 31.0 to 31.9 in adult 09/05/2016   Bilateral hip pain 09/14/2015   Medicare annual wellness visit, subsequent 09/14/2015   Sigmoid diverticulitis 01/27/2015   LBBB (left bundle branch block) 12/23/2014   Hematochezia 12/23/2014   Abdominal pain    Diverticulitis of large intestine without perforation or abscess without bleeding    Chest pain    Solitary pulmonary nodule 06/09/2014   Left lower lobe pneumonia 10/06/2013   Urinary incontinence in female 03/02/2013   Postmenopause atrophic vaginitis 03/02/2013   RUQ PAIN 04/01/2007   Essential (primary) hypertension 03/24/2007   Diverticulosis of colon 03/17/2007     REFERRING PROVIDER: Jerri Kay HERO, MD   REFERRING DIAG: [phone] (ICD-10-CM) - Status post total replacement of right hip   THERAPY DIAG:  Pain in right leg  Muscle weakness (generalized)  Difficulty in walking, not  elsewhere classified  Rationale for Evaluation and Treatment: Rehabilitation  ONSET DATE: DOS  05/13/2023  SUBJECTIVE:   SUBJECTIVE STATEMENT:  Pt states she felt fine after prior treatment.  Pt denies pain currently.  She states she has pain if she moves her leg up and out.  Pt reports compliance with HEP.     PERTINENT HISTORY: R THA anterior approach on 05/13/23 Osteoporosis OA including L hip Bilat RCR, HTN  PAIN:  0/10 current, 4-5/10 worst Worst pain is moving leg up and out R groin.  She is not having as much pain in thigh.     PRECAUTIONS: Other: R THA with anterior approach, osteoporosis   WEIGHT BEARING RESTRICTIONS: No  FALLS:  Has patient fallen in last 6 months? Yes. Number  of falls 2.  Trying to stop a soccer ball and bending over trying to move a rug  LIVING ENVIRONMENT: Lives with: lives alone Lives in: 1 story home Stairs: 4 steps to enter home with rails    PLOF: Independent  PATIENT GOALS: improved performance of car transfers and stairs.  To be able to perform stairs normally.  Improve strength.    OBJECTIVE:  Note: Objective measures were completed at Evaluation unless otherwise noted.  DIAGNOSTIC FINDINGS: X rays of R hip:  stable R THR without complication.  PATIENT SURVEYS:  LEFS:  14/80 (Initial) LEFS:   41/80 on 07/07/24  COGNITION: Overall cognitive status: Within functional limits for tasks assessed      PALPATION: TTP:  R LE:  lateral thigh/ ITB,  mid to distal central thigh   LOWER EXTREMITY ROM:  Active ROM Right eval Left eval Right 11/11  Hip flexion     Hip extension     Hip abduction     Hip adduction     Hip internal rotation     Hip external rotation     Knee flexion 106 122 116  Knee extension     Ankle dorsiflexion     Ankle plantarflexion     Ankle inversion     Ankle eversion      (Blank rows = not tested)  LOWER EXTREMITY MMT:  MMT Right eval Left eval Right 11/11  Hip flexion Tolerated min resistance With pain; 19.8 5/5; 25.2 5/5 24.7  Hip extension     Hip abduction 11.2 29.2 21.4  Hip adduction     Hip internal rotation     Hip external rotation     Knee flexion 4+/5 with pain 5/5 5/5  Knee extension 4-/5 with pain 5/5 4/5  Ankle dorsiflexion     Ankle plantarflexion     Ankle inversion     Ankle eversion      (Blank rows = not tested)   FUNCTIONAL TESTS:  5x STS test:  15.76 sec (initial) 5x STS test:  07/07/24:  10.11 sec  GAIT:  Comments:  Pt has a heel to toe gait pattern.  Improved quality of gait with reduced limp.  Pt favors R LE though had no significant limp.  TREATMENT:    11/13 Nustep lvl 3 x bilat UE/Les  Manual Therapy:  STM and TPR to R quad and ITB.  LAQ 1.5# x10, 2# 2x10 Supine bridge with RTB around LE's 2x15  Step ups on 4 inch step with rail support x10 and x12 approx Step downs with 4 inch step with rail support   11/11 Reviewed current function, pain levels, response to prior treatment, and HEP compliance. PT assessed strength, knee AROM, gait, and 5x STS test.  See above. Pt completed LEFS.  See above.  Manual Therapy:  STM and TPR to R quad and ITB. Supine bridge with RTB around LE's 2x15  Stairs:  step to gait pattern with rail ascending and descending stairs.  Attempted a step through pattern with ascending steps though had difficulty and used bilat hands on rail.    07/02/24 Nustep L3 x5 minutes seat 5, UE/LEs Manual Therapy:  STM and TPR to R quad and ITB.  Rolling to R quad in supine SAQ 2x10  2.5# Supine bridge with RTB around LE's 2x15 Supine manual HS stretch 2x30 sec bilat S/L clams RTB 2x10  R LE   06/24/24  Nustep L3x8 minutes seat 5 all four extremities  Goals   Sidelying quad set  + hip flexion x10 R LE Bridges + ABD into red TB x12  HS stretches 2x30 seconds B Piriformis stretches 2x30 seconds B Sidelying clams red TB x10 B Hip hikes x10 B   Education on differences between approaches for hip replacements as well as general reasoning for why some surgeons recommend PT after THR surgery/some do not, encouraged her to call her insurance to see if they would reimburse her for [employer]          PATIENT EDUCATION:  Education details: dx, exercise form, relevant anatomy, HEP, POC, and rationale of interventions.  PT answered pt's questions.   Person educated: Patient Education method: Explanation, Demonstration, Tactile cues, Verbal cues Education comprehension: verbalized understanding, returned demonstration, verbal cues required, tactile cues required,  and needs further education  HOME EXERCISE PROGRAM:  Access Code: [email or web address] URL: [email or web address] Date: 06/24/2024 Prepared by: Josette Rough  Exercises - Supine Quad Set  - 1-2 x daily - 7 x weekly - 3 sets - 10 reps - 3seconds hold - Seated Long Arc Quad  - 1 x daily - 7 x weekly - 2 sets - 10 reps - Supine Bridge  - 1 x daily - 7 x weekly - 2 sets - 10 reps - Clamshell  - 1-2 x daily - 7 x weekly - 2 sets - 10 reps - Supine Knee Extension Strengthening  - 1-2 x daily - 7 x weekly - 1-2 sets - 10 reps - 5 seconds hold - Sidelying Hip Flexion  - 1 x daily - 7 x weekly - 3 sets - 10 reps     ASSESSMENT:  CLINICAL IMPRESSION:  Pt is progressing well with pain, sx's, strength, and function.  Pt is tender with STM to ITB.  She has improved tolerance with STM to R quad and demonstrates improved soft tissue tightness with STM.  PT added resistance to LAQ and pt tolerated it well.  PT instructed pt to slow down movement with LAQ.  PT worked on step activities to improve stair performance.  Pt performed step ups and step downs on a 4 inch step to improve functional strength and performance of stairs.  She responded well to treatment having no pain and no  c/o's after treatment.        OBJECTIVE IMPAIRMENTS: Abnormal gait, decreased activity tolerance, decreased endurance, decreased mobility, difficulty walking, decreased ROM, decreased strength, and pain.   ACTIVITY LIMITATIONS: carrying, squatting, stairs, transfers, and locomotion level  PARTICIPATION LIMITATIONS: cleaning, shopping, and community activity  PERSONAL FACTORS: 1-2 comorbidities: osteoporosis, L hip OA are also affecting patient's functional outcome.   REHAB POTENTIAL: Good  CLINICAL DECISION MAKING: Stable/uncomplicated  EVALUATION COMPLEXITY: Low   GOALS:  SHORT TERM GOALS: Target date: 06/15/24  Pt will be independent and compliant with HEP for improved pain, strength, and function.   Baseline: Goal status: MET 06/24/24  2.  Pt will report at least a 25% improvement in pain and sx's overall.   Baseline:  Goal status: MET 06/24/24  3.  Pt will report at least a 50% improvement in lifting her LE for improved dressing and performance of car transfers.  Baseline:  Goal status:  GOAL MET  11/11     LONG TERM GOALS: Target date: 08/18/2024  Pt will ambulate without limping.  Baseline:  Goal status: PROGRESSING  2.  Pt will be able to perform stairs with a  reciprocal gait with a rail.  Baseline:  Goal status: NOT MET  3.  Pt will be able to perform car transfers without difficulty.  Baseline:  Goal status:  PROGRESSING  4.  Pt will report she is able to ambulate community distance without difficulty and increased pain.  Baseline:  Goal status: ONGOING  5.  Pt will demo improved R knee strength to 5/5 MMT and at least a 10# increase in R hip abd for improved performance of and tolerance with functional mobility.  Baseline:  Goal status: 50% MET    PLAN:  PT FREQUENCY: 2x/week  PT DURATION: 5-6 weeks  PLANNED INTERVENTIONS: 97164- PT Re-evaluation, 97750- Physical Performance Testing, 97110-Therapeutic exercises, 97530- Therapeutic activity, [id number]- Neuromuscular re-education, 97535- Self Care, 02859- Manual therapy, [id number]- Gait training, [phone]- Aquatic Therapy, [phone]- Electrical stimulation (unattended), 20560 (1-2 muscles), 20561 (3+ muscles)- Dry Needling, Patient/Family education, Balance training, Stair training, Taping, Cryotherapy, and Moist heat  PLAN FOR NEXT SESSION: Review and perform HEP, progress PRN.  R LE strengthening, proprioception, and gait.  Cont with STM/IASTM, percussion gun if this was helpful.  Perform step ups on a low step next visit.  Possibly increase height of step ups to a 6 inches.    Leigh Minerva III PT, DPT 07/09/24 10:47 PM

## 2024-07-14 ENCOUNTER — Ambulatory Visit (HOSPITAL_BASED_OUTPATIENT_CLINIC_OR_DEPARTMENT_OTHER): Admitting: Physical Therapy

## 2024-07-14 ENCOUNTER — Encounter (HOSPITAL_BASED_OUTPATIENT_CLINIC_OR_DEPARTMENT_OTHER): Payer: Self-pay | Admitting: Physical Therapy

## 2024-07-14 DIAGNOSIS — R262 Difficulty in walking, not elsewhere classified: Secondary | ICD-10-CM

## 2024-07-14 DIAGNOSIS — M6281 Muscle weakness (generalized): Secondary | ICD-10-CM

## 2024-07-14 DIAGNOSIS — M79604 Pain in right leg: Secondary | ICD-10-CM

## 2024-07-14 NOTE — Therapy (Signed)
 OUTPATIENT PHYSICAL THERAPY LOWER EXTREMITY TREATMENT       Patient Name: Cheryl Barnes MRN: [id number] DOB:1951-04-08, 73 y.o., female Today's Date: 07/15/2024  END OF SESSION:  PT End of Session - 07/14/24 0807     Visit Number 10    Number of Visits 18    Date for Recertification  08/18/24    Authorization Type MCR A and B    PT Start Time 0804    PT Stop Time 0849    PT Time Calculation (min) 45 min    Activity Tolerance Patient tolerated treatment well    Behavior During Therapy WFL for tasks assessed/performed                Past Medical History:  Diagnosis Date   Allergy    Arthritis    Back pain    Chest pain    Diverticulitis    High cholesterol    History of hiatal hernia    Hypertension    Joint pain    LBBB (left bundle branch block)    Migraine    history   Nonobstructive atherosclerosis of coronary artery    Osteoarthritis    Osteoarthritis, hip, bilateral    Past Surgical History:  Procedure Laterality Date   CESAREAN SECTION     x2   CHOLECYSTECTOMY     PARTIAL HYSTERECTOMY     Abdominal   ROTATOR CUFF REPAIR Left    ROTATOR CUFF REPAIR Right    TONSILLECTOMY     removed as a child   TOTAL HIP ARTHROPLASTY Right 05/13/2023   Procedure: RIGHT TOTAL HIP REPLACEMENT;  Surgeon: Jerri Kay HERO, MD;  Location: [facility] OR;  Service: Orthopedics;  Laterality: Right;  3-C   TYMPANOSTOMY TUBE PLACEMENT Right 2022   Patient Active Problem List   Diagnosis Date Noted   Abnormal food appetite 06/09/2024   Osteoporosis 05/16/2024   Insulin  resistance 03/03/2024   At increased risk for cardiovascular disease 03/03/2024   Iron deficiency anemia 02/10/2024   Abnormal glucose 02/10/2024   Statin intolerance - tried several in past, 2-3 years ago 12/16/2023   Tinnitus of both ears 11/04/2023   Status post total replacement of right hip 05/13/2023   Primary osteoarthritis of right hip 05/12/2023   Primary osteoarthritis of left hip 02/06/2023    Statin myopathy 10/24/2022   Atherosclerosis of aorta 10/17/2020   Nocturnal leg cramps 10/01/2018   Splenic artery aneurysm 03/01/2017   Thyroid  nodule 01/27/2017   Acute diverticulitis 01/24/2017   Recurrent herpes labialis 09/21/2016   Pure hypercholesterolemia 09/05/2016   Class 1 obesity with serious comorbidity and body mass index (BMI) of 31.0 to 31.9 in adult 09/05/2016   Bilateral hip pain 09/14/2015   Medicare annual wellness visit, subsequent 09/14/2015   Sigmoid diverticulitis 01/27/2015   LBBB (left bundle branch block) 12/23/2014   Hematochezia 12/23/2014   Abdominal pain    Diverticulitis of large intestine without perforation or abscess without bleeding    Chest pain    Solitary pulmonary nodule 06/09/2014   Left lower lobe pneumonia 10/06/2013   Urinary incontinence in female 03/02/2013   Postmenopause atrophic vaginitis 03/02/2013   RUQ PAIN 04/01/2007   Essential (primary) hypertension 03/24/2007   Diverticulosis of colon 03/17/2007     REFERRING PROVIDER: Jerri Kay HERO, MD   REFERRING DIAG: [id number] (ICD-10-CM) - Status post total replacement of right hip   THERAPY DIAG:  Pain in right leg  Muscle weakness (generalized)  Difficulty in walking, not  elsewhere classified  Rationale for Evaluation and Treatment: Rehabilitation  ONSET DATE: DOS  05/13/2023  SUBJECTIVE:   SUBJECTIVE STATEMENT:  Pt states she was sore for a couple of days after prior treatment.  Pt denies pain currently.  Pt reports compliance with HEP.     PERTINENT HISTORY: R THA anterior approach on 05/13/23 Osteoporosis OA including L hip Bilat RCR, HTN  PAIN:  0/10 current, 4-5/10 worst Worst pain is moving leg up and out R groin.  She is not having as much pain in thigh.     PRECAUTIONS: Other: R THA with anterior approach, osteoporosis   WEIGHT BEARING RESTRICTIONS: No  FALLS:  Has patient fallen in last 6 months? Yes. Number of falls 2.  Trying to stop a soccer  ball and bending over trying to move a rug  LIVING ENVIRONMENT: Lives with: lives alone Lives in: 1 story home Stairs: 4 steps to enter home with rails    PLOF: Independent  PATIENT GOALS: improved performance of car transfers and stairs.  To be able to perform stairs normally.  Improve strength.    OBJECTIVE:  Note: Objective measures were completed at Evaluation unless otherwise noted.  DIAGNOSTIC FINDINGS: X rays of R hip:  stable R THR without complication.  PATIENT SURVEYS:  LEFS:  14/80 (Initial) LEFS:   41/80 on 07/07/24  COGNITION: Overall cognitive status: Within functional limits for tasks assessed      PALPATION: TTP:  R LE:  lateral thigh/ ITB,  mid to distal central thigh   LOWER EXTREMITY ROM:  Active ROM Right eval Left eval Right 11/11  Hip flexion     Hip extension     Hip abduction     Hip adduction     Hip internal rotation     Hip external rotation     Knee flexion 106 122 116  Knee extension     Ankle dorsiflexion     Ankle plantarflexion     Ankle inversion     Ankle eversion      (Blank rows = not tested)  LOWER EXTREMITY MMT:  MMT Right eval Left eval Right 11/11  Hip flexion Tolerated min resistance With pain; 19.8 5/5; 25.2 5/5 24.7  Hip extension     Hip abduction 11.2 29.2 21.4  Hip adduction     Hip internal rotation     Hip external rotation     Knee flexion 4+/5 with pain 5/5 5/5  Knee extension 4-/5 with pain 5/5 4/5  Ankle dorsiflexion     Ankle plantarflexion     Ankle inversion     Ankle eversion      (Blank rows = not tested)   FUNCTIONAL TESTS:  5x STS test:  15.76 sec (initial) 5x STS test:  07/07/24:  10.11 sec  GAIT:  Comments:  Pt has a heel to toe gait pattern.  Improved quality of gait with reduced limp.  Pt favors R LE though had no significant limp.  TREATMENT:     11/18 Nustep lvl 3-4 x bilat UE/Les Step ups x 5 reps on 4 inch step bilat LE's, x10 reps on 6 inch step Step downs 4 inch x 10 R LE, 6 inch x 10 reps bilat LE's Lateral step ups on 4 inch step LAQ  2# x10, 15 Supine bridge with RTB around LE's 2x15  Manual Therapy:  STM and TPR to R quad and ITB.   11/13 Nustep lvl 3 x bilat UE/Les  Manual Therapy:  STM and TPR to R quad and ITB.  LAQ 1.5# x10, 2# 2x10 Supine bridge with RTB around LE's 2x15  Step ups on 4 inch step with rail support x10 and x12 approx Step downs with 4 inch step with rail support   11/11 Reviewed current function, pain levels, response to prior treatment, and HEP compliance. PT assessed strength, knee AROM, gait, and 5x STS test.  See above. Pt completed LEFS.  See above.  Manual Therapy:  STM and TPR to R quad and ITB. Supine bridge with RTB around LE's 2x15  Stairs:  step to gait pattern with rail ascending and descending stairs.  Attempted a step through pattern with ascending steps though had difficulty and used bilat hands on rail.    07/02/24 Nustep L3 x5 minutes seat 5, UE/LEs Manual Therapy:  STM and TPR to R quad and ITB.  Rolling to R quad in supine SAQ 2x10  2.5# Supine bridge with RTB around LE's 2x15 Supine manual HS stretch 2x30 sec bilat S/L clams RTB 2x10  R LE   06/24/24  Nustep L3x8 minutes seat 5 all four extremities  Goals   Sidelying quad set  + hip flexion x10 R LE Bridges + ABD into red TB x12  HS stretches 2x30 seconds B Piriformis stretches 2x30 seconds B Sidelying clams red TB x10 B Hip hikes x10 B   Education on differences between approaches for hip replacements as well as general reasoning for why some surgeons recommend PT after THR surgery/some do not, encouraged her to call her insurance to see if they would reimburse her for [employer]          PATIENT EDUCATION:  Education details: dx, exercise form, relevant anatomy, HEP, POC,  and rationale of interventions.  PT answered pt's questions.   Person educated: Patient Education method: Explanation, Demonstration, Tactile cues, Verbal cues Education comprehension: verbalized understanding, returned demonstration, verbal cues required, tactile cues required, and needs further education  HOME EXERCISE PROGRAM:  Access Code: [email or web address] URL: [email or web address] Date: 06/24/2024 Prepared by: Josette Rough  Exercises - Supine Quad Set  - 1-2 x daily - 7 x weekly - 3 sets - 10 reps - 3seconds hold - Seated Long Arc Quad  - 1 x daily - 7 x weekly - 2 sets - 10 reps - Supine Bridge  - 1 x daily - 7 x weekly - 2 sets - 10 reps - Clamshell  - 1-2 x daily - 7 x weekly - 2 sets - 10 reps - Supine Knee Extension Strengthening  - 1-2 x daily - 7 x weekly - 1-2 sets - 10 reps - 5 seconds hold - Sidelying Hip Flexion  - 1 x daily - 7 x weekly - 3 sets - 10 reps     ASSESSMENT:  CLINICAL IMPRESSION:  Pt is progressing well with pain, sx's, strength, and function.  Pt is tender with STM to ITB.  She has improved tolerance with STM  to R quad and demonstrates improved soft tissue tightness with STM.  PT added resistance to LAQ and pt tolerated it well.  PT instructed pt to slow down movement with LAQ.  PT worked on step activities to improve stair performance.  PT increased height to 6 inches with step ups and step downs.  Pt was challenged with 6 inch step downs though was able to tolerate both step ups and step downs well.  Pt performed exercises well.  She continues to have tenderness with STM to ITB though tolerated it well.  She responded well to treatment stating she felt fine and had increased pain to 2/10 after treatment.     OBJECTIVE IMPAIRMENTS: Abnormal gait, decreased activity tolerance, decreased endurance, decreased mobility, difficulty walking, decreased ROM, decreased strength, and pain.   ACTIVITY LIMITATIONS: carrying, squatting, stairs, transfers,  and locomotion level  PARTICIPATION LIMITATIONS: cleaning, shopping, and community activity  PERSONAL FACTORS: 1-2 comorbidities: osteoporosis, L hip OA are also affecting patient's functional outcome.   REHAB POTENTIAL: Good  CLINICAL DECISION MAKING: Stable/uncomplicated  EVALUATION COMPLEXITY: Low   GOALS:  SHORT TERM GOALS: Target date: 06/15/24  Pt will be independent and compliant with HEP for improved pain, strength, and function.  Baseline: Goal status: MET 06/24/24  2.  Pt will report at least a 25% improvement in pain and sx's overall.   Baseline:  Goal status: MET 06/24/24  3.  Pt will report at least a 50% improvement in lifting her LE for improved dressing and performance of car transfers.  Baseline:  Goal status:  GOAL MET  11/11     LONG TERM GOALS: Target date: 08/18/2024  Pt will ambulate without limping.  Baseline:  Goal status: PROGRESSING  2.  Pt will be able to perform stairs with a  reciprocal gait with a rail.  Baseline:  Goal status: NOT MET  3.  Pt will be able to perform car transfers without difficulty.  Baseline:  Goal status:  PROGRESSING  4.  Pt will report she is able to ambulate community distance without difficulty and increased pain.  Baseline:  Goal status: ONGOING  5.  Pt will demo improved R knee strength to 5/5 MMT and at least a 10# increase in R hip abd for improved performance of and tolerance with functional mobility.  Baseline:  Goal status: 50% MET    PLAN:  PT FREQUENCY: 2x/week  PT DURATION: 5-6 weeks  PLANNED INTERVENTIONS: 97164- PT Re-evaluation, 97750- Physical Performance Testing, 97110-Therapeutic exercises, 97530- Therapeutic activity, [id number]- Neuromuscular re-education, 97535- Self Care, 02859- Manual therapy, [id number]- Gait training, [phone]- Aquatic Therapy, [phone]- Electrical stimulation (unattended), 20560 (1-2 muscles), 20561 (3+ muscles)- Dry Needling, Patient/Family education, Balance training, Stair  training, Taping, Cryotherapy, and Moist heat  PLAN FOR NEXT SESSION: Review and perform HEP, progress PRN.  R LE strengthening, proprioception, and gait.  Cont with STM/IASTM, percussion gun if this was helpful.  Perform step ups on a low step next visit.  Possibly increase height of step ups to a 6 inches.    Leigh Minerva III PT, DPT 07/15/24 12:29 PM

## 2024-07-16 ENCOUNTER — Ambulatory Visit (HOSPITAL_BASED_OUTPATIENT_CLINIC_OR_DEPARTMENT_OTHER)

## 2024-07-16 ENCOUNTER — Encounter (HOSPITAL_BASED_OUTPATIENT_CLINIC_OR_DEPARTMENT_OTHER): Payer: Self-pay

## 2024-07-16 ENCOUNTER — Encounter (HOSPITAL_BASED_OUTPATIENT_CLINIC_OR_DEPARTMENT_OTHER): Admitting: Physical Therapy

## 2024-07-16 DIAGNOSIS — R262 Difficulty in walking, not elsewhere classified: Secondary | ICD-10-CM

## 2024-07-16 DIAGNOSIS — M79604 Pain in right leg: Secondary | ICD-10-CM

## 2024-07-16 DIAGNOSIS — M6281 Muscle weakness (generalized): Secondary | ICD-10-CM

## 2024-07-16 NOTE — Therapy (Signed)
 OUTPATIENT PHYSICAL THERAPY LOWER EXTREMITY TREATMENT       Patient Name: Cheryl Barnes MRN: [id number] DOB:August 18, 1951, 73 y.o., female Today's Date: 07/16/2024  END OF SESSION:  PT End of Session - 07/16/24 0843     Visit Number 11    Number of Visits 18    Date for Recertification  08/18/24    Authorization Type MCR A and B    PT Start Time 0840    PT Stop Time 0930    PT Time Calculation (min) 50 min    Activity Tolerance Patient tolerated treatment well    Behavior During Therapy WFL for tasks assessed/performed                Past Medical History:  Diagnosis Date   Allergy    Arthritis    Back pain    Chest pain    Diverticulitis    High cholesterol    History of hiatal hernia    Hypertension    Joint pain    LBBB (left bundle branch block)    Migraine    history   Nonobstructive atherosclerosis of coronary artery    Osteoarthritis    Osteoarthritis, hip, bilateral    Past Surgical History:  Procedure Laterality Date   CESAREAN SECTION     x2   CHOLECYSTECTOMY     PARTIAL HYSTERECTOMY     Abdominal   ROTATOR CUFF REPAIR Left    ROTATOR CUFF REPAIR Right    TONSILLECTOMY     removed as a child   TOTAL HIP ARTHROPLASTY Right 05/13/2023   Procedure: RIGHT TOTAL HIP REPLACEMENT;  Surgeon: Jerri Kay HERO, MD;  Location: [facility] OR;  Service: Orthopedics;  Laterality: Right;  3-C   TYMPANOSTOMY TUBE PLACEMENT Right 2022   Patient Active Problem List   Diagnosis Date Noted   Abnormal food appetite 06/09/2024   Osteoporosis 05/16/2024   Insulin  resistance 03/03/2024   At increased risk for cardiovascular disease 03/03/2024   Iron deficiency anemia 02/10/2024   Abnormal glucose 02/10/2024   Statin intolerance - tried several in past, 2-3 years ago 12/16/2023   Tinnitus of both ears 11/04/2023   Status post total replacement of right hip 05/13/2023   Primary osteoarthritis of right hip 05/12/2023   Primary osteoarthritis of left hip 02/06/2023    Statin myopathy 10/24/2022   Atherosclerosis of aorta 10/17/2020   Nocturnal leg cramps 10/01/2018   Splenic artery aneurysm 03/01/2017   Thyroid  nodule 01/27/2017   Acute diverticulitis 01/24/2017   Recurrent herpes labialis 09/21/2016   Pure hypercholesterolemia 09/05/2016   Class 1 obesity with serious comorbidity and body mass index (BMI) of 31.0 to 31.9 in adult 09/05/2016   Bilateral hip pain 09/14/2015   Medicare annual wellness visit, subsequent 09/14/2015   Sigmoid diverticulitis 01/27/2015   LBBB (left bundle branch block) 12/23/2014   Hematochezia 12/23/2014   Abdominal pain    Diverticulitis of large intestine without perforation or abscess without bleeding    Chest pain    Solitary pulmonary nodule 06/09/2014   Left lower lobe pneumonia 10/06/2013   Urinary incontinence in female 03/02/2013   Postmenopause atrophic vaginitis 03/02/2013   RUQ PAIN 04/01/2007   Essential (primary) hypertension 03/24/2007   Diverticulosis of colon 03/17/2007     REFERRING PROVIDER: Jerri Kay HERO, MD   REFERRING DIAG: [phone] (ICD-10-CM) - Status post total replacement of right hip   THERAPY DIAG:  Pain in right leg  Muscle weakness (generalized)  Difficulty in walking, not  elsewhere classified  Rationale for Evaluation and Treatment: Rehabilitation  ONSET DATE: DOS  05/13/2023  SUBJECTIVE:   SUBJECTIVE STATEMENT:  Pt states she was sore for a couple of days after prior treatment.  Pt denies pain currently.  Pt reports compliance with HEP.     PERTINENT HISTORY: R THA anterior approach on 05/13/23 Osteoporosis OA including L hip Bilat RCR, HTN  PAIN:  0/10 current, 4-5/10 worst Worst pain is moving leg up and out R groin.  She is not having as much pain in thigh.     PRECAUTIONS: Other: R THA with anterior approach, osteoporosis   WEIGHT BEARING RESTRICTIONS: No  FALLS:  Has patient fallen in last 6 months? Yes. Number of falls 2.  Trying to stop a soccer  ball and bending over trying to move a rug  LIVING ENVIRONMENT: Lives with: lives alone Lives in: 1 story home Stairs: 4 steps to enter home with rails    PLOF: Independent  PATIENT GOALS: improved performance of car transfers and stairs.  To be able to perform stairs normally.  Improve strength.    OBJECTIVE:  Note: Objective measures were completed at Evaluation unless otherwise noted.  DIAGNOSTIC FINDINGS: X rays of R hip:  stable R THR without complication.  PATIENT SURVEYS:  LEFS:  14/80 (Initial) LEFS:   41/80 on 07/07/24  COGNITION: Overall cognitive status: Within functional limits for tasks assessed      PALPATION: TTP:  R LE:  lateral thigh/ ITB,  mid to distal central thigh   LOWER EXTREMITY ROM:  Active ROM Right eval Left eval Right 11/11  Hip flexion     Hip extension     Hip abduction     Hip adduction     Hip internal rotation     Hip external rotation     Knee flexion 106 122 116  Knee extension     Ankle dorsiflexion     Ankle plantarflexion     Ankle inversion     Ankle eversion      (Blank rows = not tested)  LOWER EXTREMITY MMT:  MMT Right eval Left eval Right 11/11  Hip flexion Tolerated min resistance With pain; 19.8 5/5; 25.2 5/5 24.7  Hip extension     Hip abduction 11.2 29.2 21.4  Hip adduction     Hip internal rotation     Hip external rotation     Knee flexion 4+/5 with pain 5/5 5/5  Knee extension 4-/5 with pain 5/5 4/5  Ankle dorsiflexion     Ankle plantarflexion     Ankle inversion     Ankle eversion      (Blank rows = not tested)   FUNCTIONAL TESTS:  5x STS test:  15.76 sec (initial) 5x STS test:  07/07/24:  10.11 sec  GAIT:  Comments:  Pt has a heel to toe gait pattern.  Improved quality of gait with reduced limp.  Pt favors R LE though had no significant limp.  TREATMENT:     11/20 Nustep lvl 4 x bilat UE/Les Step ups 4 x10, 6 2x10 Step downs 4 inch 2x10 Lateral step up/overs  on 4 inch step x10 Standing marching 2# ankle weights 2x10 (cues for posture) Standing hip abduction/extension 2# ankle weights 2x10ea/bil Sit to stands 2x10 (R LE slighlty posterior) Gait in hall x263ft Supine R HSS 2x30sec Piriformis stretch 30sec x3 R   Manual Therapy:  STM to R hip flexors (proximal) in supine   11/18 Nustep lvl 3-4 x bilat UE/Les Step ups x 5 reps on 4 inch step bilat LE's, x10 reps on 6 inch step Step downs 4 inch x 10 R LE, 6 inch x 10 reps bilat LE's Lateral step ups on 4 inch step LAQ  2# x10, 15 Supine bridge with RTB around LE's 2x15  Manual Therapy:  STM and TPR to R quad and ITB.        PATIENT EDUCATION:  Education details: dx, exercise form, relevant anatomy, HEP, POC, and rationale of interventions.  PT answered pt's questions.   Person educated: Patient Education method: Explanation, Demonstration, Tactile cues, Verbal cues Education comprehension: verbalized understanding, returned demonstration, verbal cues required, tactile cues required, and needs further education  HOME EXERCISE PROGRAM:  Access Code: [email or web address] URL: [email or web address] Date: 06/24/2024 Prepared by: Josette Rough  Exercises - Supine Quad Set  - 1-2 x daily - 7 x weekly - 3 sets - 10 reps - 3seconds hold - Seated Long Arc Quad  - 1 x daily - 7 x weekly - 2 sets - 10 reps - Supine Bridge  - 1 x daily - 7 x weekly - 2 sets - 10 reps - Clamshell  - 1-2 x daily - 7 x weekly - 2 sets - 10 reps - Supine Knee Extension Strengthening  - 1-2 x daily - 7 x weekly - 1-2 sets - 10 reps - 5 seconds hold - Sidelying Hip Flexion  - 1 x daily - 7 x weekly - 3 sets - 10 reps     ASSESSMENT:  CLINICAL IMPRESSION:  Good tolerance for strengthening interventions today without c/o pain, only muscular fatigue. Verbal and demonstrative cuing  provided for proper performance  and muscle activation with exercises. Significant weight shift to L side noticed with sit to stands, so worked on staggered performance to challenged R LE. Pt felt strong stretch with piriformis stretching, so prescribed for home performance to work on this.  Educated on DOMS and management. Will continue to progress as tolerated.     OBJECTIVE IMPAIRMENTS: Abnormal gait, decreased activity tolerance, decreased endurance, decreased mobility, difficulty walking, decreased ROM, decreased strength, and pain.   ACTIVITY LIMITATIONS: carrying, squatting, stairs, transfers, and locomotion level  PARTICIPATION LIMITATIONS: cleaning, shopping, and community activity  PERSONAL FACTORS: 1-2 comorbidities: osteoporosis, L hip OA are also affecting patient's functional outcome.   REHAB POTENTIAL: Good  CLINICAL DECISION MAKING: Stable/uncomplicated  EVALUATION COMPLEXITY: Low   GOALS:  SHORT TERM GOALS: Target date: 06/15/24  Pt will be independent and compliant with HEP for improved pain, strength, and function.  Baseline: Goal status: MET 06/24/24  2.  Pt will report at least a 25% improvement in pain and sx's overall.   Baseline:  Goal status: MET 06/24/24  3.  Pt will report at least a 50% improvement in lifting her LE for improved dressing and performance of car transfers.  Baseline:  Goal status:  GOAL MET  11/11     LONG TERM  GOALS: Target date: 08/18/2024  Pt will ambulate without limping.  Baseline:  Goal status: PROGRESSING  2.  Pt will be able to perform stairs with a  reciprocal gait with a rail.  Baseline:  Goal status: NOT MET  3.  Pt will be able to perform car transfers without difficulty.  Baseline:  Goal status:  PROGRESSING  4.  Pt will report she is able to ambulate community distance without difficulty and increased pain.  Baseline:  Goal status: ONGOING  5.  Pt will demo improved R knee strength to 5/5 MMT and at least  a 10# increase in R hip abd for improved performance of and tolerance with functional mobility.  Baseline:  Goal status: 50% MET    PLAN:  PT FREQUENCY: 2x/week  PT DURATION: 5-6 weeks  PLANNED INTERVENTIONS: 97164- PT Re-evaluation, 97750- Physical Performance Testing, 97110-Therapeutic exercises, 97530- Therapeutic activity, [id number]- Neuromuscular re-education, 97535- Self Care, [id number]- Manual therapy, [id number]- Gait training, [phone]- Aquatic Therapy, [phone]- Electrical stimulation (unattended), 20560 (1-2 muscles), 20561 (3+ muscles)- Dry Needling, Patient/Family education, Balance training, Stair training, Taping, Cryotherapy, and Moist heat  PLAN FOR NEXT SESSION: Review and perform HEP, progress PRN.  R LE strengthening, proprioception, and gait.  Cont with STM/IASTM, percussion gun if this was helpful.  Perform step ups on a low step next visit.  Possibly increase height of step ups to a 6 inches.    Kimila Papaleo, PTA  07/16/24 9:47 AM

## 2024-07-17 ENCOUNTER — Encounter (HOSPITAL_BASED_OUTPATIENT_CLINIC_OR_DEPARTMENT_OTHER): Admitting: Physical Therapy

## 2024-07-20 ENCOUNTER — Encounter (HOSPITAL_BASED_OUTPATIENT_CLINIC_OR_DEPARTMENT_OTHER): Payer: Self-pay | Admitting: Physical Therapy

## 2024-07-20 ENCOUNTER — Ambulatory Visit (HOSPITAL_BASED_OUTPATIENT_CLINIC_OR_DEPARTMENT_OTHER): Payer: Self-pay | Admitting: Physical Therapy

## 2024-07-20 DIAGNOSIS — R262 Difficulty in walking, not elsewhere classified: Secondary | ICD-10-CM

## 2024-07-20 DIAGNOSIS — M6281 Muscle weakness (generalized): Secondary | ICD-10-CM

## 2024-07-20 DIAGNOSIS — M79604 Pain in right leg: Secondary | ICD-10-CM | POA: Diagnosis not present

## 2024-07-20 NOTE — Therapy (Signed)
 OUTPATIENT PHYSICAL THERAPY LOWER EXTREMITY TREATMENT       Patient Name: Cheryl Barnes MRN: [id number] DOB:12/05/1950, 73 y.o., female Today's Date: 07/21/2024  END OF SESSION:  PT End of Session - 07/20/24 0856     Visit Number 12    Number of Visits 18    Date for Recertification  08/18/24    Authorization Type MCR A and B    PT Start Time 0853    PT Stop Time 0935    PT Time Calculation (min) 42 min    Activity Tolerance Patient tolerated treatment well    Behavior During Therapy WFL for tasks assessed/performed                Past Medical History:  Diagnosis Date   Allergy    Arthritis    Back pain    Chest pain    Diverticulitis    High cholesterol    History of hiatal hernia    Hypertension    Joint pain    LBBB (left bundle branch block)    Migraine    history   Nonobstructive atherosclerosis of coronary artery    Osteoarthritis    Osteoarthritis, hip, bilateral    Past Surgical History:  Procedure Laterality Date   CESAREAN SECTION     x2   CHOLECYSTECTOMY     PARTIAL HYSTERECTOMY     Abdominal   ROTATOR CUFF REPAIR Left    ROTATOR CUFF REPAIR Right    TONSILLECTOMY     removed as a child   TOTAL HIP ARTHROPLASTY Right 05/13/2023   Procedure: RIGHT TOTAL HIP REPLACEMENT;  Surgeon: Jerri Kay HERO, MD;  Location: [facility] OR;  Service: Orthopedics;  Laterality: Right;  3-C   TYMPANOSTOMY TUBE PLACEMENT Right 2022   Patient Active Problem List   Diagnosis Date Noted   Abnormal food appetite 06/09/2024   Osteoporosis 05/16/2024   Insulin  resistance 03/03/2024   At increased risk for cardiovascular disease 03/03/2024   Iron deficiency anemia 02/10/2024   Abnormal glucose 02/10/2024   Statin intolerance - tried several in past, 2-3 years ago 12/16/2023   Tinnitus of both ears 11/04/2023   Status post total replacement of right hip 05/13/2023   Primary osteoarthritis of right hip 05/12/2023   Primary osteoarthritis of left hip 02/06/2023    Statin myopathy 10/24/2022   Atherosclerosis of aorta 10/17/2020   Nocturnal leg cramps 10/01/2018   Splenic artery aneurysm 03/01/2017   Thyroid  nodule 01/27/2017   Acute diverticulitis 01/24/2017   Recurrent herpes labialis 09/21/2016   Pure hypercholesterolemia 09/05/2016   Class 1 obesity with serious comorbidity and body mass index (BMI) of 31.0 to 31.9 in adult 09/05/2016   Bilateral hip pain 09/14/2015   Medicare annual wellness visit, subsequent 09/14/2015   Sigmoid diverticulitis 01/27/2015   LBBB (left bundle branch block) 12/23/2014   Hematochezia 12/23/2014   Abdominal pain    Diverticulitis of large intestine without perforation or abscess without bleeding    Chest pain    Solitary pulmonary nodule 06/09/2014   Left lower lobe pneumonia 10/06/2013   Urinary incontinence in female 03/02/2013   Postmenopause atrophic vaginitis 03/02/2013   RUQ PAIN 04/01/2007   Essential (primary) hypertension 03/24/2007   Diverticulosis of colon 03/17/2007     REFERRING PROVIDER: Jerri Kay HERO, MD   REFERRING DIAG: [id number] (ICD-10-CM) - Status post total replacement of right hip   THERAPY DIAG:  Pain in right leg  Muscle weakness (generalized)  Difficulty in walking, not  elsewhere classified  Rationale for Evaluation and Treatment: Rehabilitation  ONSET DATE: DOS  05/13/2023  SUBJECTIVE:   SUBJECTIVE STATEMENT:  Pt states she was sore for a couple of days after prior treatment.  Pt denies pain currently.  Pt reports compliance with HEP.     PERTINENT HISTORY: R THA anterior approach on 05/13/23 Osteoporosis OA including L hip Bilat RCR, HTN  PAIN:  0/10 current, 4-5/10 worst Worst pain is moving leg up and out R groin.  She is not having as much pain in thigh.     PRECAUTIONS: Other: R THA with anterior approach, osteoporosis   WEIGHT BEARING RESTRICTIONS: No  FALLS:  Has patient fallen in last 6 months? Yes. Number of falls 2.  Trying to stop a soccer  ball and bending over trying to move a rug  LIVING ENVIRONMENT: Lives with: lives alone Lives in: 1 story home Stairs: 4 steps to enter home with rails    PLOF: Independent  PATIENT GOALS: improved performance of car transfers and stairs.  To be able to perform stairs normally.  Improve strength.    OBJECTIVE:  Note: Objective measures were completed at Evaluation unless otherwise noted.  DIAGNOSTIC FINDINGS: X rays of R hip:  stable R THR without complication.  PATIENT SURVEYS:  LEFS:  14/80 (Initial) LEFS:   41/80 on 07/07/24  COGNITION: Overall cognitive status: Within functional limits for tasks assessed      PALPATION: TTP:  R LE:  lateral thigh/ ITB,  mid to distal central thigh   LOWER EXTREMITY ROM:  Active ROM Right eval Left eval Right 11/11  Hip flexion     Hip extension     Hip abduction     Hip adduction     Hip internal rotation     Hip external rotation     Knee flexion 106 122 116  Knee extension     Ankle dorsiflexion     Ankle plantarflexion     Ankle inversion     Ankle eversion      (Blank rows = not tested)  LOWER EXTREMITY MMT:  MMT Right eval Left eval Right 11/11  Hip flexion Tolerated min resistance With pain; 19.8 5/5; 25.2 5/5 24.7  Hip extension     Hip abduction 11.2 29.2 21.4  Hip adduction     Hip internal rotation     Hip external rotation     Knee flexion 4+/5 with pain 5/5 5/5  Knee extension 4-/5 with pain 5/5 4/5  Ankle dorsiflexion     Ankle plantarflexion     Ankle inversion     Ankle eversion      (Blank rows = not tested)   FUNCTIONAL TESTS:  5x STS test:  15.76 sec (initial) 5x STS test:  07/07/24:  10.11 sec  GAIT:  Comments:  Pt has a heel to toe gait pattern.  Improved quality of gait with reduced limp.  Pt favors R LE though had no significant limp.  TREATMENT:     11/24 Nustep lvl 4 x bilat UE/Les though pt started just using LE's toward the end Step ups with R LE, step downs with L LE leading 4 inch step x10, 6 inch 2x10 with UE support Lateral step ups/overs on 4 inch 2x10 with UE support Sit to stands x10 (R LE slighlty posterior) LAQ 3# 2x10 Supine manual R HSS 2x30 sec  Manual Therapy:  STM and TPR to R quad and ITB.  STM to anterior hip.   11/20 Nustep lvl 4 x bilat UE/Les Step ups 4 x10, 6 2x10 Step downs 4 inch 2x10 Lateral step up/overs  on 4 inch step x10 Standing marching 2# ankle weights 2x10 (cues for posture) Standing hip abduction/extension 2# ankle weights 2x10ea/bil Sit to stands 2x10 (R LE slighlty posterior) Gait in hall x246ft Supine R HSS 2x30sec Piriformis stretch 30sec x3 R   Manual Therapy:  STM to R hip flexors (proximal) in supine   11/18 Nustep lvl 3-4 x bilat UE/Les Step ups x 5 reps on 4 inch step bilat LE's, x10 reps on 6 inch step Step downs 4 inch x 10 R LE, 6 inch x 10 reps bilat LE's Lateral step ups on 4 inch step LAQ  2# x10, 15 Supine bridge with RTB around LE's 2x15  Manual Therapy:  STM and TPR to R quad and ITB.        PATIENT EDUCATION:  Education details: dx, exercise form, relevant anatomy, HEP, POC, and rationale of interventions.  PT answered pt's questions.   Person educated: Patient Education method: Explanation, Demonstration, Tactile cues, Verbal cues Education comprehension: verbalized understanding, returned demonstration, verbal cues required, tactile cues required, and needs further education  HOME EXERCISE PROGRAM:  Access Code: [email or web address] URL: [email or web address] Date: 06/24/2024 Prepared by: Josette Rough  Exercises - Supine Quad Set  - 1-2 x daily - 7 x weekly - 3 sets - 10 reps - 3seconds hold - Seated Long Arc Quad  - 1 x daily - 7 x weekly - 2 sets - 10 reps - Supine Bridge  - 1 x daily - 7 x weekly - 2 sets - 10 reps -  Clamshell  - 1-2 x daily - 7 x weekly - 2 sets - 10 reps - Supine Knee Extension Strengthening  - 1-2 x daily - 7 x weekly - 1-2 sets - 10 reps - 5 seconds hold - Sidelying Hip Flexion  - 1 x daily - 7 x weekly - 3 sets - 10 reps     ASSESSMENT:  CLINICAL IMPRESSION:  PT worked on step activities to improve stair performance.  PT provided instruction and cues to correct form and improve smooth performance of step ups and downs.  She demonstrated improved performance with instruction and cuing.  Pt reported some pain in bilat hips with lateral step ups.  Pt gave good effort with all exercises.  Pt tolerated STM to R quad, ITB, and anterior hip well.  Pt had tenderness with STM/TPR to ITB.  Pt responded well to treatment stating she feels tender though has no increased pain after treatment.  Pt should benefit from cont skilled PT to address goals and impairments and restore desired level of function.     OBJECTIVE IMPAIRMENTS: Abnormal gait, decreased activity tolerance, decreased endurance, decreased mobility, difficulty walking, decreased ROM, decreased strength, and pain.   ACTIVITY LIMITATIONS: carrying, squatting, stairs, transfers, and locomotion level  PARTICIPATION LIMITATIONS: cleaning, shopping, and community activity  PERSONAL FACTORS: 1-2 comorbidities: osteoporosis, L hip OA are also affecting patient's functional outcome.   REHAB POTENTIAL: Good  CLINICAL DECISION MAKING: Stable/uncomplicated  EVALUATION COMPLEXITY: Low   GOALS:  SHORT TERM GOALS: Target date: 06/15/24  Pt will be independent and compliant with HEP for improved pain, strength, and function.  Baseline: Goal status: MET 06/24/24  2.  Pt will report at least a 25% improvement in pain and sx's overall.   Baseline:  Goal status: MET 06/24/24  3.  Pt will report at least a 50% improvement in lifting her LE for improved dressing and performance of car transfers.  Baseline:  Goal status:  GOAL MET   11/11     LONG TERM GOALS: Target date: 08/18/2024  Pt will ambulate without limping.  Baseline:  Goal status: PROGRESSING  2.  Pt will be able to perform stairs with a  reciprocal gait with a rail.  Baseline:  Goal status: NOT MET  3.  Pt will be able to perform car transfers without difficulty.  Baseline:  Goal status:  PROGRESSING  4.  Pt will report she is able to ambulate community distance without difficulty and increased pain.  Baseline:  Goal status: ONGOING  5.  Pt will demo improved R knee strength to 5/5 MMT and at least a 10# increase in R hip abd for improved performance of and tolerance with functional mobility.  Baseline:  Goal status: 50% MET    PLAN:  PT FREQUENCY: 2x/week  PT DURATION: 5-6 weeks  PLANNED INTERVENTIONS: 97164- PT Re-evaluation, 97750- Physical Performance Testing, 97110-Therapeutic exercises, 97530- Therapeutic activity, [id number]- Neuromuscular re-education, 97535- Self Care, 02859- Manual therapy, [id number]- Gait training, [phone]- Aquatic Therapy, [phone]- Electrical stimulation (unattended), 20560 (1-2 muscles), 20561 (3+ muscles)- Dry Needling, Patient/Family education, Balance training, Stair training, Taping, Cryotherapy, and Moist heat  PLAN FOR NEXT SESSION: Review and perform HEP, progress PRN.  R LE strengthening, proprioception, and gait.  Cont with STM/IASTM, percussion gun if this was helpful.   Cont with step activities.  Leigh Minerva III PT, DPT 07/21/24 8:38 AM

## 2024-07-28 ENCOUNTER — Encounter (HOSPITAL_BASED_OUTPATIENT_CLINIC_OR_DEPARTMENT_OTHER): Payer: Self-pay | Admitting: Physical Therapy

## 2024-07-28 ENCOUNTER — Ambulatory Visit (HOSPITAL_BASED_OUTPATIENT_CLINIC_OR_DEPARTMENT_OTHER): Attending: Orthopaedic Surgery | Admitting: Physical Therapy

## 2024-07-28 DIAGNOSIS — R262 Difficulty in walking, not elsewhere classified: Secondary | ICD-10-CM | POA: Insufficient documentation

## 2024-07-28 DIAGNOSIS — M79604 Pain in right leg: Secondary | ICD-10-CM | POA: Diagnosis present

## 2024-07-28 DIAGNOSIS — M6281 Muscle weakness (generalized): Secondary | ICD-10-CM | POA: Diagnosis present

## 2024-07-28 NOTE — Therapy (Signed)
 OUTPATIENT PHYSICAL THERAPY LOWER EXTREMITY TREATMENT       Patient Name: Cheryl Barnes MRN: [id number] DOB:1951/01/30, 73 y.o., female Today's Date: 07/28/2024  END OF SESSION:  PT End of Session - 07/28/24 1431     Visit Number 13    Number of Visits 18    Date for Recertification  08/18/24    Authorization Type MCR A and B    PT Start Time 1431    PT Stop Time 1511    PT Time Calculation (min) 40 min    Activity Tolerance Patient tolerated treatment well    Behavior During Therapy WFL for tasks assessed/performed                 Past Medical History:  Diagnosis Date   Allergy    Arthritis    Back pain    Chest pain    Diverticulitis    High cholesterol    History of hiatal hernia    Hypertension    Joint pain    LBBB (left bundle branch block)    Migraine    history   Nonobstructive atherosclerosis of coronary artery    Osteoarthritis    Osteoarthritis, hip, bilateral    Past Surgical History:  Procedure Laterality Date   CESAREAN SECTION     x2   CHOLECYSTECTOMY     PARTIAL HYSTERECTOMY     Abdominal   ROTATOR CUFF REPAIR Left    ROTATOR CUFF REPAIR Right    TONSILLECTOMY     removed as a child   TOTAL HIP ARTHROPLASTY Right 05/13/2023   Procedure: RIGHT TOTAL HIP REPLACEMENT;  Surgeon: Jerri Kay HERO, MD;  Location: [facility] OR;  Service: Orthopedics;  Laterality: Right;  3-C   TYMPANOSTOMY TUBE PLACEMENT Right 2022   Patient Active Problem List   Diagnosis Date Noted   Abnormal food appetite 06/09/2024   Osteoporosis 05/16/2024   Insulin  resistance 03/03/2024   At increased risk for cardiovascular disease 03/03/2024   Iron deficiency anemia 02/10/2024   Abnormal glucose 02/10/2024   Statin intolerance - tried several in past, 2-3 years ago 12/16/2023   Tinnitus of both ears 11/04/2023   Status post total replacement of right hip 05/13/2023   Primary osteoarthritis of right hip 05/12/2023   Primary osteoarthritis of left hip  02/06/2023   Statin myopathy 10/24/2022   Atherosclerosis of aorta 10/17/2020   Nocturnal leg cramps 10/01/2018   Splenic artery aneurysm 03/01/2017   Thyroid  nodule 01/27/2017   Acute diverticulitis 01/24/2017   Recurrent herpes labialis 09/21/2016   Pure hypercholesterolemia 09/05/2016   Class 1 obesity with serious comorbidity and body mass index (BMI) of 31.0 to 31.9 in adult 09/05/2016   Bilateral hip pain 09/14/2015   Medicare annual wellness visit, subsequent 09/14/2015   Sigmoid diverticulitis 01/27/2015   LBBB (left bundle branch block) 12/23/2014   Hematochezia 12/23/2014   Abdominal pain    Diverticulitis of large intestine without perforation or abscess without bleeding    Chest pain    Solitary pulmonary nodule 06/09/2014   Left lower lobe pneumonia 10/06/2013   Urinary incontinence in female 03/02/2013   Postmenopause atrophic vaginitis 03/02/2013   RUQ PAIN 04/01/2007   Essential (primary) hypertension 03/24/2007   Diverticulosis of colon 03/17/2007     REFERRING PROVIDER: Jerri Kay HERO, MD   REFERRING DIAG: [phone] (ICD-10-CM) - Status post total replacement of right hip   THERAPY DIAG:  Pain in right leg  Muscle weakness (generalized)  Difficulty in walking,  not elsewhere classified  Rationale for Evaluation and Treatment: Rehabilitation  ONSET DATE: DOS  05/13/2023  SUBJECTIVE:   SUBJECTIVE STATEMENT:   Feel like I'm making good progress here, side steps and stepping up really helps a lot! Still have a muscle that hurts way up in the groin, I try to massage it when this happens    PERTINENT HISTORY: R THA anterior approach on 05/13/23 Osteoporosis OA including L hip Bilat RCR, HTN  PAIN:  0/10 current Worst pain is moving leg up and out R groin.  She is not having as much pain in thigh.     PRECAUTIONS: Other: R THA with anterior approach, osteoporosis   WEIGHT BEARING RESTRICTIONS: No  FALLS:  Has patient fallen in last 6 months?  Yes. Number of falls 2.  Trying to stop a soccer ball and bending over trying to move a rug  LIVING ENVIRONMENT: Lives with: lives alone Lives in: 1 story home Stairs: 4 steps to enter home with rails    PLOF: Independent  PATIENT GOALS: improved performance of car transfers and stairs.  To be able to perform stairs normally.  Improve strength.    OBJECTIVE:  Note: Objective measures were completed at Evaluation unless otherwise noted.  DIAGNOSTIC FINDINGS: X rays of R hip:  stable R THR without complication.  PATIENT SURVEYS:  LEFS:  14/80 (Initial) LEFS:   41/80 on 07/07/24  COGNITION: Overall cognitive status: Within functional limits for tasks assessed      PALPATION: TTP:  R LE:  lateral thigh/ ITB,  mid to distal central thigh   LOWER EXTREMITY ROM:  Active ROM Right eval Left eval Right 11/11  Hip flexion     Hip extension     Hip abduction     Hip adduction     Hip internal rotation     Hip external rotation     Knee flexion 106 122 116  Knee extension     Ankle dorsiflexion     Ankle plantarflexion     Ankle inversion     Ankle eversion      (Blank rows = not tested)  LOWER EXTREMITY MMT:  MMT Right eval Left eval Right 11/11  Hip flexion Tolerated min resistance With pain; 19.8 5/5; 25.2 5/5 24.7  Hip extension     Hip abduction 11.2 29.2 21.4  Hip adduction     Hip internal rotation     Hip external rotation     Knee flexion 4+/5 with pain 5/5 5/5  Knee extension 4-/5 with pain 5/5 4/5  Ankle dorsiflexion     Ankle plantarflexion     Ankle inversion     Ankle eversion      (Blank rows = not tested)   FUNCTIONAL TESTS:  5x STS test:  15.76 sec (initial) 5x STS test:  07/07/24:  10.11 sec  GAIT:  Comments:  Pt has a heel to toe gait pattern.  Improved quality of gait with reduced limp.  Pt favors R LE though had no significant limp.  TREATMENT:     12/02  Nustep L5x8 minutes all four extremities for 4 minutes, then dropped to just LEs for the next 4 minutes   Forward step ups 6 inch box 2x12 B Forward step downs 4 inch box 2x8 B  Goblet hold 5# STS x12 from mat table Standing hip ABD green TB above knees x12 B  Tandem stance blue foam 6x30 seconds alternating Tandem gait forward/backward x4 laps    11/24 Nustep lvl 4 x bilat UE/Les though pt started just using LE's toward the end Step ups with R LE, step downs with L LE leading 4 inch step x10, 6 inch 2x10 with UE support Lateral step ups/overs on 4 inch 2x10 with UE support Sit to stands x10 (R LE slighlty posterior) LAQ 3# 2x10 Supine manual R HSS 2x30 sec  Manual Therapy:  STM and TPR to R quad and ITB.  STM to anterior hip.   11/20 Nustep lvl 4 x bilat UE/Les Step ups 4 x10, 6 2x10 Step downs 4 inch 2x10 Lateral step up/overs  on 4 inch step x10 Standing marching 2# ankle weights 2x10 (cues for posture) Standing hip abduction/extension 2# ankle weights 2x10ea/bil Sit to stands 2x10 (R LE slighlty posterior) Gait in hall x245ft Supine R HSS 2x30sec Piriformis stretch 30sec x3 R   Manual Therapy:  STM to R hip flexors (proximal) in supine   11/18 Nustep lvl 3-4 x bilat UE/Les Step ups x 5 reps on 4 inch step bilat LE's, x10 reps on 6 inch step Step downs 4 inch x 10 R LE, 6 inch x 10 reps bilat LE's Lateral step ups on 4 inch step LAQ  2# x10, 15 Supine bridge with RTB around LE's 2x15  Manual Therapy:  STM and TPR to R quad and ITB.        PATIENT EDUCATION:  Education details: dx, exercise form, relevant anatomy, HEP, POC, and rationale of interventions.  PT answered pt's questions.   Person educated: Patient Education method: Explanation, Demonstration, Tactile cues, Verbal cues Education comprehension: verbalized understanding, returned demonstration, verbal cues required,  tactile cues required, and needs further education  HOME EXERCISE PROGRAM:  Access Code: [email or web address] URL: [email or web address] Date: 06/24/2024 Prepared by: Josette Rough  Exercises - Supine Quad Set  - 1-2 x daily - 7 x weekly - 3 sets - 10 reps - 3seconds hold - Seated Long Arc Quad  - 1 x daily - 7 x weekly - 2 sets - 10 reps - Supine Bridge  - 1 x daily - 7 x weekly - 2 sets - 10 reps - Clamshell  - 1-2 x daily - 7 x weekly - 2 sets - 10 reps - Supine Knee Extension Strengthening  - 1-2 x daily - 7 x weekly - 1-2 sets - 10 reps - 5 seconds hold - Sidelying Hip Flexion  - 1 x daily - 7 x weekly - 3 sets - 10 reps     ASSESSMENT:  CLINICAL IMPRESSION:  Arrives today doing well, she has made a lot of observable functional progress since the last time this therapist worked with her. Continued challenging closed chain strength and functional endurance, as well as balance this visit. Very motivated to improve.    OBJECTIVE IMPAIRMENTS: Abnormal gait, decreased activity tolerance, decreased endurance, decreased mobility, difficulty walking, decreased ROM, decreased strength, and pain.   ACTIVITY LIMITATIONS: carrying, squatting, stairs, transfers, and locomotion level  PARTICIPATION LIMITATIONS: cleaning, shopping, and community activity  PERSONAL FACTORS: 1-2 comorbidities: osteoporosis, L hip OA are also affecting patient's functional outcome.   REHAB POTENTIAL: Good  CLINICAL DECISION MAKING: Stable/uncomplicated  EVALUATION COMPLEXITY: Low   GOALS:  SHORT TERM GOALS: Target date: 06/15/24  Pt will be independent and compliant with HEP for improved pain, strength, and function.  Baseline: Goal status: MET 06/24/24  2.  Pt will report at least a 25% improvement in pain and sx's overall.   Baseline:  Goal status: MET 06/24/24  3.  Pt will report at least a 50% improvement in lifting her LE for improved dressing and performance of car transfers.   Baseline:  Goal status:  GOAL MET  11/11     LONG TERM GOALS: Target date: 08/18/2024  Pt will ambulate without limping.  Baseline:  Goal status: PROGRESSING  2.  Pt will be able to perform stairs with a  reciprocal gait with a rail.  Baseline:  Goal status: NOT MET  3.  Pt will be able to perform car transfers without difficulty.  Baseline:  Goal status:  PROGRESSING  4.  Pt will report she is able to ambulate community distance without difficulty and increased pain.  Baseline:  Goal status: ONGOING  5.  Pt will demo improved R knee strength to 5/5 MMT and at least a 10# increase in R hip abd for improved performance of and tolerance with functional mobility.  Baseline:  Goal status: 50% MET    PLAN:  PT FREQUENCY: 2x/week  PT DURATION: 5-6 weeks  PLANNED INTERVENTIONS: 97164- PT Re-evaluation, 97750- Physical Performance Testing, 97110-Therapeutic exercises, 97530- Therapeutic activity, [id number]- Neuromuscular re-education, 97535- Self Care, 02859- Manual therapy, [id number]- Gait training, [phone]- Aquatic Therapy, [phone]- Electrical stimulation (unattended), 20560 (1-2 muscles), 20561 (3+ muscles)- Dry Needling, Patient/Family education, Balance training, Stair training, Taping, Cryotherapy, and Moist heat  PLAN FOR NEXT SESSION: Review and perform HEP, progress PRN.  R LE strengthening, proprioception, and gait.  Cont with STM/IASTM, percussion gun if this was helpful.   Cont with step activities. Try STM to proximal quad and hip flexor next visit per pt request   Josette Rough, PT, DPT 07/28/24 3:12 PM

## 2024-07-30 ENCOUNTER — Telehealth (HOSPITAL_BASED_OUTPATIENT_CLINIC_OR_DEPARTMENT_OTHER): Payer: Self-pay | Admitting: Physical Therapy

## 2024-07-30 ENCOUNTER — Ambulatory Visit (HOSPITAL_BASED_OUTPATIENT_CLINIC_OR_DEPARTMENT_OTHER): Admitting: Physical Therapy

## 2024-07-30 NOTE — Telephone Encounter (Signed)
 No-show for today's appt- called and spoke to pt, she thought that her appt was later in the day/forgot morning appt time. Discussed time/date of next appt.  Josette Rough, PT, DPT 07/30/24 9:50 AM

## 2024-08-03 ENCOUNTER — Ambulatory Visit (HOSPITAL_BASED_OUTPATIENT_CLINIC_OR_DEPARTMENT_OTHER)

## 2024-08-03 ENCOUNTER — Encounter (HOSPITAL_BASED_OUTPATIENT_CLINIC_OR_DEPARTMENT_OTHER): Payer: Self-pay

## 2024-08-03 DIAGNOSIS — R262 Difficulty in walking, not elsewhere classified: Secondary | ICD-10-CM

## 2024-08-03 DIAGNOSIS — M6281 Muscle weakness (generalized): Secondary | ICD-10-CM

## 2024-08-03 DIAGNOSIS — M79604 Pain in right leg: Secondary | ICD-10-CM

## 2024-08-03 NOTE — Therapy (Signed)
 OUTPATIENT PHYSICAL THERAPY LOWER EXTREMITY TREATMENT       Patient Name: Cheryl Barnes MRN: [id number] DOB:[birth year], 73 y.o., female Today's Date: 08/03/2024  END OF SESSION:  PT End of Session - 08/03/24 1409     Visit Number 14    Number of Visits 18    Date for Recertification  08/18/24    Authorization Type MCR A and B    PT Start Time 1406    PT Stop Time 1453    PT Time Calculation (min) 47 min    Activity Tolerance Patient tolerated treatment well    Behavior During Therapy WFL for tasks assessed/performed                  Past Medical History:  Diagnosis Date   Allergy    Arthritis    Back pain    Chest pain    Diverticulitis    High cholesterol    History of hiatal hernia    Hypertension    Joint pain    LBBB (left bundle branch block)    Migraine    history   Nonobstructive atherosclerosis of coronary artery    Osteoarthritis    Osteoarthritis, hip, bilateral    Past Surgical History:  Procedure Laterality Date   CESAREAN SECTION     x2   CHOLECYSTECTOMY     PARTIAL HYSTERECTOMY     Abdominal   ROTATOR CUFF REPAIR Left    ROTATOR CUFF REPAIR Right    TONSILLECTOMY     removed as a child   TOTAL HIP ARTHROPLASTY Right 05/13/2023   Procedure: RIGHT TOTAL HIP REPLACEMENT;  Surgeon: Jerri Kay HERO, MD;  Location: [facility] OR;  Service: Orthopedics;  Laterality: Right;  3-C   TYMPANOSTOMY TUBE PLACEMENT Right 2022   Patient Active Problem List   Diagnosis Date Noted   Abnormal food appetite 06/09/2024   Osteoporosis 05/16/2024   Insulin  resistance 03/03/2024   At increased risk for cardiovascular disease 03/03/2024   Iron deficiency anemia 02/10/2024   Abnormal glucose 02/10/2024   Statin intolerance - tried several in past, 2-3 years ago 12/16/2023   Tinnitus of both ears 11/04/2023   Status post total replacement of right hip 05/13/2023   Primary osteoarthritis of right hip 05/12/2023   Primary osteoarthritis of left hip  02/06/2023   Statin myopathy 10/24/2022   Atherosclerosis of aorta 10/17/2020   Nocturnal leg cramps 10/01/2018   Splenic artery aneurysm 03/01/2017   Thyroid  nodule 01/27/2017   Acute diverticulitis 01/24/2017   Recurrent herpes labialis 09/21/2016   Pure hypercholesterolemia 09/05/2016   Class 1 obesity with serious comorbidity and body mass index (BMI) of 31.0 to 31.9 in adult 09/05/2016   Bilateral hip pain 09/14/2015   Medicare annual wellness visit, subsequent 09/14/2015   Sigmoid diverticulitis 01/27/2015   LBBB (left bundle branch block) 12/23/2014   Hematochezia 12/23/2014   Abdominal pain    Diverticulitis of large intestine without perforation or abscess without bleeding    Chest pain    Solitary pulmonary nodule 06/09/2014   Left lower lobe pneumonia 10/06/2013   Urinary incontinence in female 03/02/2013   Postmenopause atrophic vaginitis 03/02/2013   RUQ PAIN 04/01/2007   Essential (primary) hypertension 03/24/2007   Diverticulosis of colon 03/17/2007     REFERRING PROVIDER: Jerri Kay HERO, MD   REFERRING DIAG: [phone] (ICD-10-CM) - Status post total replacement of right hip   THERAPY DIAG:  Pain in right leg  Muscle weakness (generalized)  Difficulty in  walking, not elsewhere classified  Rationale for Evaluation and Treatment: Rehabilitation  ONSET DATE: DOS  05/13/2023  SUBJECTIVE:   SUBJECTIVE STATEMENT:   Pt reports she is achey today due to the weather. She reports a 3-4/10 pain level with movement.    PERTINENT HISTORY: R THA anterior approach on 05/13/23 Osteoporosis OA including L hip Bilat RCR, HTN  PAIN:  3-4/10 current Worst pain is moving leg up and out R groin.  She is not having as much pain in thigh.     PRECAUTIONS: Other: R THA with anterior approach, osteoporosis   WEIGHT BEARING RESTRICTIONS: No  FALLS:  Has patient fallen in last 6 months? Yes. Number of falls 2.  Trying to stop a soccer ball and bending over trying  to move a rug  LIVING ENVIRONMENT: Lives with: lives alone Lives in: 1 story home Stairs: 4 steps to enter home with rails    PLOF: Independent  PATIENT GOALS: improved performance of car transfers and stairs.  To be able to perform stairs normally.  Improve strength.    OBJECTIVE:  Note: Objective measures were completed at Evaluation unless otherwise noted.  DIAGNOSTIC FINDINGS: X rays of R hip:  stable R THR without complication.  PATIENT SURVEYS:  LEFS:  14/80 (Initial) LEFS:   41/80 on 07/07/24  COGNITION: Overall cognitive status: Within functional limits for tasks assessed      PALPATION: TTP:  R LE:  lateral thigh/ ITB,  mid to distal central thigh   LOWER EXTREMITY ROM:  Active ROM Right eval Left eval Right 11/11  Hip flexion     Hip extension     Hip abduction     Hip adduction     Hip internal rotation     Hip external rotation     Knee flexion 106 122 116  Knee extension     Ankle dorsiflexion     Ankle plantarflexion     Ankle inversion     Ankle eversion      (Blank rows = not tested)  LOWER EXTREMITY MMT:  MMT Right eval Left eval Right 11/11  Hip flexion Tolerated min resistance With pain; 19.8 5/5; 25.2 5/5 24.7  Hip extension     Hip abduction 11.2 29.2 21.4  Hip adduction     Hip internal rotation     Hip external rotation     Knee flexion 4+/5 with pain 5/5 5/5  Knee extension 4-/5 with pain 5/5 4/5  Ankle dorsiflexion     Ankle plantarflexion     Ankle inversion     Ankle eversion      (Blank rows = not tested)   FUNCTIONAL TESTS:  5x STS test:  15.76 sec (initial) 5x STS test:  07/07/24:  10.11 sec  GAIT:  Comments:  Pt has a heel to toe gait pattern.  Improved quality of gait with reduced limp.  Pt favors R LE though had no significant limp.  TREATMENT:    12/08  Nustep L5x5 minutes  all four extremities for 4 minutes Forward step ups 6 inch box 2x12 B Forward step downs 4 inch box 2x10 R only IASTM using roller to R thigh SAQ/SLR trial in supine with black wedge under knees Bridges 2x10 Goblet hold 5# STS 2x10 from mat table Standing hip ABD green TB at ankles 2x10bil SLS with fingertip support 5x15 seconds R LE HR 2x15 (more weight through R LE)    12/02  Nustep L5x8 minutes all four extremities for 4 minutes, then dropped to just LEs for the next 4 minutes   Forward step ups 6 inch box 2x12 B Forward step downs 4 inch box 2x8 B  Goblet hold 5# STS x12 from mat table Standing hip ABD green TB above knees x12 B  Tandem stance blue foam 6x30 seconds alternating Tandem gait forward/backward x4 laps    11/24 Nustep lvl 4 x bilat UE/Les though pt started just using LE's toward the end Step ups with R LE, step downs with L LE leading 4 inch step x10, 6 inch 2x10 with UE support Lateral step ups/overs on 4 inch 2x10 with UE support Sit to stands x10 (R LE slighlty posterior) LAQ 3# 2x10 Supine manual R HSS 2x30 sec  Manual Therapy:  STM and TPR to R quad and ITB.  STM to anterior hip.   11/20 Nustep lvl 4 x bilat UE/Les Step ups 4 x10, 6 2x10 Step downs 4 inch 2x10 Lateral step up/overs  on 4 inch step x10 Standing marching 2# ankle weights 2x10 (cues for posture) Standing hip abduction/extension 2# ankle weights 2x10ea/bil Sit to stands 2x10 (R LE slighlty posterior) Gait in hall x211ft Supine R HSS 2x30sec Piriformis stretch 30sec x3 R   Manual Therapy:  STM to R hip flexors (proximal) in supine   11/18 Nustep lvl 3-4 x bilat UE/Les Step ups x 5 reps on 4 inch step bilat LE's, x10 reps on 6 inch step Step downs 4 inch x 10 R LE, 6 inch x 10 reps bilat LE's Lateral step ups on 4 inch step LAQ  2# x10, 15 Supine bridge with RTB around LE's 2x15  Manual Therapy:  STM and TPR to R quad and ITB.        PATIENT  EDUCATION:  Education details: dx, exercise form, relevant anatomy, HEP, POC, and rationale of interventions.  PT answered pt's questions.   Person educated: Patient Education method: Explanation, Demonstration, Tactile cues, Verbal cues Education comprehension: verbalized understanding, returned demonstration, verbal cues required, tactile cues required, and needs further education  HOME EXERCISE PROGRAM:  Access Code: [email or web address] URL: [email or web address] Date: 06/24/2024 Prepared by: Josette Rough  Exercises - Supine Quad Set  - 1-2 x daily - 7 x weekly - 3 sets - 10 reps - 3seconds hold - Seated Long Arc Quad  - 1 x daily - 7 x weekly - 2 sets - 10 reps - Supine Bridge  - 1 x daily - 7 x weekly - 2 sets - 10 reps - Clamshell  - 1-2 x daily - 7 x weekly - 2 sets - 10 reps - Supine Knee Extension Strengthening  - 1-2 x daily - 7 x weekly - 1-2 sets - 10 reps - 5 seconds hold - Sidelying Hip Flexion  - 1 x daily - 7 x weekly - 3 sets - 10 reps     ASSESSMENT:  CLINICAL IMPRESSION:  Pt has been  making progress with PT. She is cahllenged by eccentric fwd step downs with R LE due to weakness and feeling of buckling. Pt unable to raise R LE independently for sit<>supine transfers but can self assist. Will work on improving this in clinic. MT performed to decrease tightness reported in affected area of anterior R thigh. Pt questioned if DN would help her. Will discuss this with DN therapist.    OBJECTIVE IMPAIRMENTS: Abnormal gait, decreased activity tolerance, decreased endurance, decreased mobility, difficulty walking, decreased ROM, decreased strength, and pain.   ACTIVITY LIMITATIONS: carrying, squatting, stairs, transfers, and locomotion level  PARTICIPATION LIMITATIONS: cleaning, shopping, and community activity  PERSONAL FACTORS: 1-2 comorbidities: osteoporosis, L hip OA are also affecting patient's functional outcome.   REHAB POTENTIAL: Good  CLINICAL  DECISION MAKING: Stable/uncomplicated  EVALUATION COMPLEXITY: Low   GOALS:  SHORT TERM GOALS: Target date: 06/15/24  Pt will be independent and compliant with HEP for improved pain, strength, and function.  Baseline: Goal status: MET 06/24/24  2.  Pt will report at least a 25% improvement in pain and sx's overall.   Baseline:  Goal status: MET 06/24/24  3.  Pt will report at least a 50% improvement in lifting her LE for improved dressing and performance of car transfers.  Baseline:  Goal status:  GOAL MET  11/11     LONG TERM GOALS: Target date: 08/18/2024  Pt will ambulate without limping.  Baseline:  Goal status: PROGRESSING  2.  Pt will be able to perform stairs with a  reciprocal gait with a rail.  Baseline:  Goal status: NOT MET  3.  Pt will be able to perform car transfers without difficulty.  Baseline:  Goal status:  PROGRESSING  4.  Pt will report she is able to ambulate community distance without difficulty and increased pain.  Baseline:  Goal status: ONGOING  5.  Pt will demo improved R knee strength to 5/5 MMT and at least a 10# increase in R hip abd for improved performance of and tolerance with functional mobility.  Baseline:  Goal status: 50% MET    PLAN:  PT FREQUENCY: 2x/week  PT DURATION: 5-6 weeks  PLANNED INTERVENTIONS: 97164- PT Re-evaluation, 97750- Physical Performance Testing, 97110-Therapeutic exercises, 97530- Therapeutic activity, [id number]- Neuromuscular re-education, 97535- Self Care, 02859- Manual therapy, [id number]- Gait training, [phone]- Aquatic Therapy, [phone]- Electrical stimulation (unattended), 20560 (1-2 muscles), 20561 (3+ muscles)- Dry Needling, Patient/Family education, Balance training, Stair training, Taping, Cryotherapy, and Moist heat  PLAN FOR NEXT SESSION: Review and perform HEP, progress PRN.  R LE strengthening, proprioception, and gait.  Cont with STM/IASTM, percussion gun if this was helpful.   Cont with step activities.  Try STM to proximal quad and hip flexor next visit per pt request   September Mormile, PTA  08/03/24 3:03 PM

## 2024-08-04 ENCOUNTER — Ambulatory Visit (INDEPENDENT_AMBULATORY_CARE_PROVIDER_SITE_OTHER): Admitting: Internal Medicine

## 2024-08-06 ENCOUNTER — Encounter (HOSPITAL_BASED_OUTPATIENT_CLINIC_OR_DEPARTMENT_OTHER): Payer: Self-pay | Admitting: Physical Therapy

## 2024-08-06 ENCOUNTER — Ambulatory Visit (HOSPITAL_BASED_OUTPATIENT_CLINIC_OR_DEPARTMENT_OTHER): Admitting: Physical Therapy

## 2024-08-06 DIAGNOSIS — M79604 Pain in right leg: Secondary | ICD-10-CM

## 2024-08-06 DIAGNOSIS — R262 Difficulty in walking, not elsewhere classified: Secondary | ICD-10-CM

## 2024-08-06 DIAGNOSIS — M6281 Muscle weakness (generalized): Secondary | ICD-10-CM

## 2024-08-06 NOTE — Therapy (Signed)
 OUTPATIENT PHYSICAL THERAPY LOWER EXTREMITY TREATMENT       Patient Name: Cheryl Barnes MRN: [id number] DOB:September 12, 1950, 73 y.o., female Today's Date: 08/06/2024  END OF SESSION:  PT End of Session - 08/06/24 0942     Visit Number 15    Number of Visits 18    Date for Recertification  08/18/24    Authorization Type MCR A and B    Progress Note Due on Visit 18   late progress note done 8th visit   PT Start Time 0933    PT Stop Time 1013    PT Time Calculation (min) 40 min    Activity Tolerance Patient tolerated treatment well    Behavior During Therapy WFL for tasks assessed/performed                   Past Medical History:  Diagnosis Date   Allergy    Arthritis    Back pain    Chest pain    Diverticulitis    High cholesterol    History of hiatal hernia    Hypertension    Joint pain    LBBB (left bundle branch block)    Migraine    history   Nonobstructive atherosclerosis of coronary artery    Osteoarthritis    Osteoarthritis, hip, bilateral    Past Surgical History:  Procedure Laterality Date   CESAREAN SECTION     x2   CHOLECYSTECTOMY     PARTIAL HYSTERECTOMY     Abdominal   ROTATOR CUFF REPAIR Left    ROTATOR CUFF REPAIR Right    TONSILLECTOMY     removed as a child   TOTAL HIP ARTHROPLASTY Right 05/13/2023   Procedure: RIGHT TOTAL HIP REPLACEMENT;  Surgeon: Jerri Kay HERO, MD;  Location: [facility] OR;  Service: Orthopedics;  Laterality: Right;  3-C   TYMPANOSTOMY TUBE PLACEMENT Right 2022   Patient Active Problem List   Diagnosis Date Noted   Abnormal food appetite 06/09/2024   Osteoporosis 05/16/2024   Insulin  resistance 03/03/2024   At increased risk for cardiovascular disease 03/03/2024   Iron deficiency anemia 02/10/2024   Abnormal glucose 02/10/2024   Statin intolerance - tried several in past, 2-3 years ago 12/16/2023   Tinnitus of both ears 11/04/2023   Status post total replacement of right hip 05/13/2023   Primary  osteoarthritis of right hip 05/12/2023   Primary osteoarthritis of left hip 02/06/2023   Statin myopathy 10/24/2022   Atherosclerosis of aorta 10/17/2020   Nocturnal leg cramps 10/01/2018   Splenic artery aneurysm 03/01/2017   Thyroid  nodule 01/27/2017   Acute diverticulitis 01/24/2017   Recurrent herpes labialis 09/21/2016   Pure hypercholesterolemia 09/05/2016   Class 1 obesity with serious comorbidity and body mass index (BMI) of 31.0 to 31.9 in adult 09/05/2016   Bilateral hip pain 09/14/2015   Medicare annual wellness visit, subsequent 09/14/2015   Sigmoid diverticulitis 01/27/2015   LBBB (left bundle branch block) 12/23/2014   Hematochezia 12/23/2014   Abdominal pain    Diverticulitis of large intestine without perforation or abscess without bleeding    Chest pain    Solitary pulmonary nodule 06/09/2014   Left lower lobe pneumonia 10/06/2013   Urinary incontinence in female 03/02/2013   Postmenopause atrophic vaginitis 03/02/2013   RUQ PAIN 04/01/2007   Essential (primary) hypertension 03/24/2007   Diverticulosis of colon 03/17/2007     REFERRING PROVIDER: Jerri Kay HERO, MD   REFERRING DIAG: [phone] (ICD-10-CM) - Status post total replacement of right  hip   THERAPY DIAG:  Pain in right leg  Muscle weakness (generalized)  Difficulty in walking, not elsewhere classified  Rationale for Evaluation and Treatment: Rehabilitation  ONSET DATE: DOS  05/13/2023  SUBJECTIVE:   SUBJECTIVE STATEMENT:   Was a little sore after last time just like a workout. Getting busy with Christmas getting close. Have to do a wake up/warm up every morning.    PERTINENT HISTORY: R THA anterior approach on 05/13/23 Osteoporosis OA including L hip Bilat RCR, HTN  PAIN:  0/10 current at rest, 2/10 with moving R LE up and out      PRECAUTIONS: Other: R THA with anterior approach, osteoporosis   WEIGHT BEARING RESTRICTIONS: No  FALLS:  Has patient fallen in last 6 months?  Yes. Number of falls 2.  Trying to stop a soccer ball and bending over trying to move a rug  LIVING ENVIRONMENT: Lives with: lives alone Lives in: 1 story home Stairs: 4 steps to enter home with rails    PLOF: Independent  PATIENT GOALS: improved performance of car transfers and stairs.  To be able to perform stairs normally.  Improve strength.    OBJECTIVE:  Note: Objective measures were completed at Evaluation unless otherwise noted.  DIAGNOSTIC FINDINGS: X rays of R hip:  stable R THR without complication.  PATIENT SURVEYS:  LEFS:  14/80 (Initial) LEFS:   41/80 on 07/07/24  COGNITION: Overall cognitive status: Within functional limits for tasks assessed      PALPATION: TTP:  R LE:  lateral thigh/ ITB,  mid to distal central thigh   LOWER EXTREMITY ROM:  Active ROM Right eval Left eval Right 11/11  Hip flexion     Hip extension     Hip abduction     Hip adduction     Hip internal rotation     Hip external rotation     Knee flexion 106 122 116  Knee extension     Ankle dorsiflexion     Ankle plantarflexion     Ankle inversion     Ankle eversion      (Blank rows = not tested)  LOWER EXTREMITY MMT:  MMT Right eval Left eval Right 11/11  Hip flexion Tolerated min resistance With pain; 19.8 5/5; 25.2 5/5 24.7  Hip extension     Hip abduction 11.2 29.2 21.4  Hip adduction     Hip internal rotation     Hip external rotation     Knee flexion 4+/5 with pain 5/5 5/5  Knee extension 4-/5 with pain 5/5 4/5  Ankle dorsiflexion     Ankle plantarflexion     Ankle inversion     Ankle eversion      (Blank rows = not tested)   FUNCTIONAL TESTS:  5x STS test:  15.76 sec (initial) 5x STS test:  07/07/24:  10.11 sec  GAIT:  Comments:  Pt has a heel to toe gait pattern.  Improved quality of gait with reduced limp.  Pt favors R LE though had no significant limp.  TREATMENT:     08/06/24  Scifit bike L3x8 minutes, seat 3 for w/u (Nustep not available) Shuttle BLE press 50# x10 Shuttle single LE press L LE 25# x10, R LE   Staggered bridges 2x10 switching feet each set Walking bridges 2x5  Forward step ups 6 inch box x15 B  Forward step downs 2x12 from 4 inch box, R LE only  Tennis ball massage proximal quad/hip flexor R, gave her tennis ball with home instructions     12/08  Nustep L5x5 minutes all four extremities for 4 minutes Forward step ups 6 inch box 2x12 B Forward step downs 4 inch box 2x10 R only IASTM using roller to R thigh SAQ/SLR trial in supine with black wedge under knees Bridges 2x10 Goblet hold 5# STS 2x10 from mat table Standing hip ABD green TB at ankles 2x10bil SLS with fingertip support 5x15 seconds R LE HR 2x15 (more weight through R LE)    12/02  Nustep L5x8 minutes all four extremities for 4 minutes, then dropped to just LEs for the next 4 minutes   Forward step ups 6 inch box 2x12 B Forward step downs 4 inch box 2x8 B  Goblet hold 5# STS x12 from mat table Standing hip ABD green TB above knees x12 B  Tandem stance blue foam 6x30 seconds alternating Tandem gait forward/backward x4 laps    11/24 Nustep lvl 4 x bilat UE/Les though pt started just using LE's toward the end Step ups with R LE, step downs with L LE leading 4 inch step x10, 6 inch 2x10 with UE support Lateral step ups/overs on 4 inch 2x10 with UE support Sit to stands x10 (R LE slighlty posterior) LAQ 3# 2x10 Supine manual R HSS 2x30 sec  Manual Therapy:  STM and TPR to R quad and ITB.  STM to anterior hip.   11/20 Nustep lvl 4 x bilat UE/Les Step ups 4 x10, 6 2x10 Step downs 4 inch 2x10 Lateral step up/overs  on 4 inch step x10 Standing marching 2# ankle weights 2x10 (cues for posture) Standing hip abduction/extension 2# ankle weights 2x10ea/bil Sit to stands 2x10 (R LE slighlty  posterior) Gait in hall x262ft Supine R HSS 2x30sec Piriformis stretch 30sec x3 R   Manual Therapy:  STM to R hip flexors (proximal) in supine   11/18 Nustep lvl 3-4 x bilat UE/Les Step ups x 5 reps on 4 inch step bilat LE's, x10 reps on 6 inch step Step downs 4 inch x 10 R LE, 6 inch x 10 reps bilat LE's Lateral step ups on 4 inch step LAQ  2# x10, 15 Supine bridge with RTB around LE's 2x15  Manual Therapy:  STM and TPR to R quad and ITB.        PATIENT EDUCATION:  Education details: dx, exercise form, relevant anatomy, HEP, POC, and rationale of interventions.  PT answered pt's questions.   Person educated: Patient Education method: Explanation, Demonstration, Tactile cues, Verbal cues Education comprehension: verbalized understanding, returned demonstration, verbal cues required, tactile cues required, and needs further education  HOME EXERCISE PROGRAM:  Access Code: [email or web address] URL: [email or web address] Date: 06/24/2024 Prepared by: Josette Rough  Exercises - Supine Quad Set  - 1-2 x daily - 7 x weekly - 3 sets - 10 reps - 3seconds hold - Seated Long Arc Quad  - 1 x daily - 7 x weekly - 2 sets - 10 reps - Supine Bridge  - 1 x daily - 7  x weekly - 2 sets - 10 reps - Clamshell  - 1-2 x daily - 7 x weekly - 2 sets - 10 reps - Supine Knee Extension Strengthening  - 1-2 x daily - 7 x weekly - 1-2 sets - 10 reps - 5 seconds hold - Sidelying Hip Flexion  - 1 x daily - 7 x weekly - 3 sets - 10 reps     ASSESSMENT:  CLINICAL IMPRESSION:  Arrives today doing well, we continued working on a mix of functional strength, balance, and manual interventions. Continued working on manual to upper thigh/hip flexors today with moderate tolerance noted. Will continue to challenge as tolerated.    OBJECTIVE IMPAIRMENTS: Abnormal gait, decreased activity tolerance, decreased endurance, decreased mobility, difficulty walking, decreased ROM, decreased strength, and  pain.   ACTIVITY LIMITATIONS: carrying, squatting, stairs, transfers, and locomotion level  PARTICIPATION LIMITATIONS: cleaning, shopping, and community activity  PERSONAL FACTORS: 1-2 comorbidities: osteoporosis, L hip OA are also affecting patient's functional outcome.   REHAB POTENTIAL: Good  CLINICAL DECISION MAKING: Stable/uncomplicated  EVALUATION COMPLEXITY: Low   GOALS:  SHORT TERM GOALS: Target date: 06/15/24  Pt will be independent and compliant with HEP for improved pain, strength, and function.  Baseline: Goal status: MET 06/24/24  2.  Pt will report at least a 25% improvement in pain and sx's overall.   Baseline:  Goal status: MET 06/24/24  3.  Pt will report at least a 50% improvement in lifting her LE for improved dressing and performance of car transfers.  Baseline:  Goal status:  GOAL MET  11/11     LONG TERM GOALS: Target date: 08/18/2024  Pt will ambulate without limping.  Baseline:  Goal status: PROGRESSING  2.  Pt will be able to perform stairs with a  reciprocal gait with a rail.  Baseline:  Goal status: NOT MET  3.  Pt will be able to perform car transfers without difficulty.  Baseline:  Goal status:  PROGRESSING  4.  Pt will report she is able to ambulate community distance without difficulty and increased pain.  Baseline:  Goal status: ONGOING  5.  Pt will demo improved R knee strength to 5/5 MMT and at least a 10# increase in R hip abd for improved performance of and tolerance with functional mobility.  Baseline:  Goal status: 50% MET    PLAN:  PT FREQUENCY: 2x/week  PT DURATION: 5-6 weeks  PLANNED INTERVENTIONS: 97164- PT Re-evaluation, 97750- Physical Performance Testing, 97110-Therapeutic exercises, 97530- Therapeutic activity, [id number]- Neuromuscular re-education, 97535- Self Care, 02859- Manual therapy, [id number]- Gait training, [phone]- Aquatic Therapy, [phone]- Electrical stimulation (unattended), 20560 (1-2 muscles), 20561 (3+  muscles)- Dry Needling, Patient/Family education, Balance training, Stair training, Taping, Cryotherapy, and Moist heat  PLAN FOR NEXT SESSION: Review and perform HEP, progress PRN.  R LE strengthening, proprioception, and gait.  Cont with STM/IASTM, percussion gun if this was helpful.   Cont with step activities. Continue STM to upper quad/hip flexor. Prefers [employer] over [employer], [place], DPT 08/06/2024 10:13 AM

## 2024-08-10 ENCOUNTER — Encounter (HOSPITAL_BASED_OUTPATIENT_CLINIC_OR_DEPARTMENT_OTHER): Payer: Self-pay | Admitting: Physical Therapy

## 2024-08-10 ENCOUNTER — Ambulatory Visit (HOSPITAL_BASED_OUTPATIENT_CLINIC_OR_DEPARTMENT_OTHER): Admitting: Physical Therapy

## 2024-08-10 DIAGNOSIS — M79604 Pain in right leg: Secondary | ICD-10-CM

## 2024-08-10 DIAGNOSIS — R262 Difficulty in walking, not elsewhere classified: Secondary | ICD-10-CM

## 2024-08-10 DIAGNOSIS — M6281 Muscle weakness (generalized): Secondary | ICD-10-CM

## 2024-08-10 NOTE — Therapy (Signed)
 OUTPATIENT PHYSICAL THERAPY LOWER EXTREMITY TREATMENT       Patient Name: Cheryl Barnes MRN: [id number] DOB:12/27/1950, 73 y.o., female Today's Date: 08/11/2024  END OF SESSION:  PT End of Session - 08/10/24 1413     Visit Number 16    Number of Visits 18    Date for Recertification  08/18/24    Authorization Type MCR A and B    PT Start Time 1411    PT Stop Time 1451    PT Time Calculation (min) 40 min    Activity Tolerance Patient tolerated treatment well    Behavior During Therapy WFL for tasks assessed/performed                   Past Medical History:  Diagnosis Date   Allergy    Arthritis    Back pain    Chest pain    Diverticulitis    High cholesterol    History of hiatal hernia    Hypertension    Joint pain    LBBB (left bundle branch block)    Migraine    history   Nonobstructive atherosclerosis of coronary artery    Osteoarthritis    Osteoarthritis, hip, bilateral    Past Surgical History:  Procedure Laterality Date   CESAREAN SECTION     x2   CHOLECYSTECTOMY     PARTIAL HYSTERECTOMY     Abdominal   ROTATOR CUFF REPAIR Left    ROTATOR CUFF REPAIR Right    TONSILLECTOMY     removed as a child   TOTAL HIP ARTHROPLASTY Right 05/13/2023   Procedure: RIGHT TOTAL HIP REPLACEMENT;  Surgeon: Jerri Kay HERO, MD;  Location: [facility] OR;  Service: Orthopedics;  Laterality: Right;  3-C   TYMPANOSTOMY TUBE PLACEMENT Right 2022   Patient Active Problem List   Diagnosis Date Noted   Abnormal food appetite 06/09/2024   Osteoporosis 05/16/2024   Insulin  resistance 03/03/2024   At increased risk for cardiovascular disease 03/03/2024   Iron deficiency anemia 02/10/2024   Abnormal glucose 02/10/2024   Statin intolerance - tried several in past, 2-3 years ago 12/16/2023   Tinnitus of both ears 11/04/2023   Status post total replacement of right hip 05/13/2023   Primary osteoarthritis of right hip 05/12/2023   Primary osteoarthritis of left hip  02/06/2023   Statin myopathy 10/24/2022   Atherosclerosis of aorta 10/17/2020   Nocturnal leg cramps 10/01/2018   Splenic artery aneurysm 03/01/2017   Thyroid  nodule 01/27/2017   Acute diverticulitis 01/24/2017   Recurrent herpes labialis 09/21/2016   Pure hypercholesterolemia 09/05/2016   Class 1 obesity with serious comorbidity and body mass index (BMI) of 31.0 to 31.9 in adult 09/05/2016   Bilateral hip pain 09/14/2015   Medicare annual wellness visit, subsequent 09/14/2015   Sigmoid diverticulitis 01/27/2015   LBBB (left bundle branch block) 12/23/2014   Hematochezia 12/23/2014   Abdominal pain    Diverticulitis of large intestine without perforation or abscess without bleeding    Chest pain    Solitary pulmonary nodule 06/09/2014   Left lower lobe pneumonia 10/06/2013   Urinary incontinence in female 03/02/2013   Postmenopause atrophic vaginitis 03/02/2013   RUQ PAIN 04/01/2007   Essential (primary) hypertension 03/24/2007   Diverticulosis of colon 03/17/2007     REFERRING PROVIDER: Jerri Kay HERO, MD   REFERRING DIAG: [id number] (ICD-10-CM) - Status post total replacement of right hip   THERAPY DIAG:  Pain in right leg  Muscle weakness (generalized)  Difficulty  in walking, not elsewhere classified  Rationale for Evaluation and Treatment: Rehabilitation  ONSET DATE: DOS  05/13/2023  SUBJECTIVE:   SUBJECTIVE STATEMENT:  Pt states she was a little sore after prior treatment.    PERTINENT HISTORY: R THA anterior approach on 05/13/23 Osteoporosis OA including L hip Bilat RCR, HTN  PAIN:  2/10 pain in R hip and thigh which she thinks is from the cold weather     PRECAUTIONS: Other: R THA with anterior approach, osteoporosis   WEIGHT BEARING RESTRICTIONS: No  FALLS:  Has patient fallen in last 6 months? Yes. Number of falls 2.  Trying to stop a soccer ball and bending over trying to move a rug  LIVING ENVIRONMENT: Lives with: lives alone Lives in: 1  story home Stairs: 4 steps to enter home with rails    PLOF: Independent  PATIENT GOALS: improved performance of car transfers and stairs.  To be able to perform stairs normally.  Improve strength.    OBJECTIVE:  Note: Objective measures were completed at Evaluation unless otherwise noted.  DIAGNOSTIC FINDINGS: X rays of R hip:  stable R THR without complication.  PATIENT SURVEYS:  LEFS:  14/80 (Initial) LEFS:   41/80 on 07/07/24  COGNITION: Overall cognitive status: Within functional limits for tasks assessed      PALPATION: TTP:  R LE:  lateral thigh/ ITB,  mid to distal central thigh   LOWER EXTREMITY ROM:  Active ROM Right eval Left eval Right 11/11  Hip flexion     Hip extension     Hip abduction     Hip adduction     Hip internal rotation     Hip external rotation     Knee flexion 106 122 116  Knee extension     Ankle dorsiflexion     Ankle plantarflexion     Ankle inversion     Ankle eversion      (Blank rows = not tested)  LOWER EXTREMITY MMT:  MMT Right eval Left eval Right 11/11  Hip flexion Tolerated min resistance With pain; 19.8 5/5; 25.2 5/5 24.7  Hip extension     Hip abduction 11.2 29.2 21.4  Hip adduction     Hip internal rotation     Hip external rotation     Knee flexion 4+/5 with pain 5/5 5/5  Knee extension 4-/5 with pain 5/5 4/5  Ankle dorsiflexion     Ankle plantarflexion     Ankle inversion     Ankle eversion      (Blank rows = not tested)   FUNCTIONAL TESTS:  5x STS test:  15.76 sec (initial) 5x STS test:  07/07/24:  10.11 sec  GAIT:  Comments:  Pt has a heel to toe gait pattern.  Improved quality of gait with reduced limp.  Pt favors R LE though had no significant limp.  TREATMENT:     08/10/24 Nustep Lvl 4 x 5 mins bilat UE/Les Step ups 6 inch 2x10 with rail Step downs 4 inch 2x10  with rail Standing hip abduction 2# 2x10 bilat Standing hip hiking x 10 reps with UE support on rail STS from table with 5# KB 2x10  Manual Therapy: STM and TPR to R quad and ITB. STM to anterior hip.    08/06/24  Scifit bike L3x8 minutes, seat 3 for w/u (Nustep not available) Shuttle BLE press 50# x10 Shuttle single LE press L LE 25# x10, R LE   Staggered bridges 2x10 switching feet each set Walking bridges 2x5  Forward step ups 6 inch box x15 B  Forward step downs 2x12 from 4 inch box, R LE only  Tennis ball massage proximal quad/hip flexor R, gave her tennis ball with home instructions     12/08  Nustep L5x5 minutes all four extremities for 4 minutes Forward step ups 6 inch box 2x12 B Forward step downs 4 inch box 2x10 R only IASTM using roller to R thigh SAQ/SLR trial in supine with black wedge under knees Bridges 2x10 Goblet hold 5# STS 2x10 from mat table Standing hip ABD green TB at ankles 2x10bil SLS with fingertip support 5x15 seconds R LE HR 2x15 (more weight through R LE)    12/02  Nustep L5x8 minutes all four extremities for 4 minutes, then dropped to just LEs for the next 4 minutes   Forward step ups 6 inch box 2x12 B Forward step downs 4 inch box 2x8 B  Goblet hold 5# STS x12 from mat table Standing hip ABD green TB above knees x12 B  Tandem stance blue foam 6x30 seconds alternating Tandem gait forward/backward x4 laps    11/24 Nustep lvl 4 x bilat UE/Les though pt started just using LE's toward the end Step ups with R LE, step downs with L LE leading 4 inch step x10, 6 inch 2x10 with UE support Lateral step ups/overs on 4 inch 2x10 with UE support Sit to stands x10 (R LE slighlty posterior) LAQ 3# 2x10 Supine manual R HSS 2x30 sec  Manual Therapy:  STM and TPR to R quad and ITB.  STM to anterior hip.   11/20 Nustep lvl 4 x bilat UE/Les Step ups 4 x10, 6 2x10 Step downs 4 inch 2x10 Lateral step up/overs  on 4 inch step  x10 Standing marching 2# ankle weights 2x10 (cues for posture) Standing hip abduction/extension 2# ankle weights 2x10ea/bil Sit to stands 2x10 (R LE slighlty posterior) Gait in hall x235ft Supine R HSS 2x30sec Piriformis stretch 30sec x3 R   Manual Therapy:  STM to R hip flexors (proximal) in supine   11/18 Nustep lvl 3-4 x bilat UE/Les Step ups x 5 reps on 4 inch step bilat LE's, x10 reps on 6 inch step Step downs 4 inch x 10 R LE, 6 inch x 10 reps bilat LE's Lateral step ups on 4 inch step LAQ  2# x10, 15 Supine bridge with RTB around LE's 2x15  Manual Therapy:  STM and TPR to R quad and ITB.        PATIENT EDUCATION:  Education details: dx, exercise form, relevant anatomy, HEP, POC, and rationale of interventions.  PT answered pt's questions.   Person educated: Patient Education method: Explanation, Demonstration, Tactile cues, Verbal cues Education comprehension: verbalized understanding, returned demonstration, verbal cues required, tactile cues required, and needs further education  HOME EXERCISE PROGRAM:  Access Code: [email or web address] URL: [email or web address] Date: 06/24/2024 Prepared by: Josette Rough  Exercises - Supine Quad Set  - 1-2 x daily - 7 x weekly - 3 sets - 10 reps - 3seconds hold - Seated Long Arc Quad  - 1 x daily - 7 x weekly - 2 sets - 10 reps - Supine Bridge  - 1 x daily - 7 x weekly - 2 sets - 10 reps - Clamshell  - 1-2 x daily - 7 x weekly - 2 sets - 10 reps - Supine Knee Extension Strengthening  - 1-2 x daily - 7 x weekly - 1-2 sets - 10 reps - 5 seconds hold - Sidelying Hip Flexion  - 1 x daily - 7 x weekly - 3 sets - 10 reps     ASSESSMENT:  CLINICAL IMPRESSION:  PT performed exercises to improve functional and LE strength for improved functional mobility and reduced pain.  Pt performed step ups and step downs to improve stair performance.  PT performed soft tissue work to improve soft tissue tightness and mobility and  pain and to reduce myofascial restrictions.  She tolerated treatment well and reports minimally increased pain to 2-3/10 after treatment.     OBJECTIVE IMPAIRMENTS: Abnormal gait, decreased activity tolerance, decreased endurance, decreased mobility, difficulty walking, decreased ROM, decreased strength, and pain.   ACTIVITY LIMITATIONS: carrying, squatting, stairs, transfers, and locomotion level  PARTICIPATION LIMITATIONS: cleaning, shopping, and community activity  PERSONAL FACTORS: 1-2 comorbidities: osteoporosis, L hip OA are also affecting patient's functional outcome.   REHAB POTENTIAL: Good  CLINICAL DECISION MAKING: Stable/uncomplicated  EVALUATION COMPLEXITY: Low   GOALS:  SHORT TERM GOALS: Target date: 06/15/24  Pt will be independent and compliant with HEP for improved pain, strength, and function.  Baseline: Goal status: MET 06/24/24  2.  Pt will report at least a 25% improvement in pain and sx's overall.   Baseline:  Goal status: MET 06/24/24  3.  Pt will report at least a 50% improvement in lifting her LE for improved dressing and performance of car transfers.  Baseline:  Goal status:  GOAL MET  11/11     LONG TERM GOALS: Target date: 08/18/2024  Pt will ambulate without limping.  Baseline:  Goal status: PROGRESSING  2.  Pt will be able to perform stairs with a  reciprocal gait with a rail.  Baseline:  Goal status: NOT MET  3.  Pt will be able to perform car transfers without difficulty.  Baseline:  Goal status:  PROGRESSING  4.  Pt will report she is able to ambulate community distance without difficulty and increased pain.  Baseline:  Goal status: ONGOING  5.  Pt will demo improved R knee strength to 5/5 MMT and at least a 10# increase in R hip abd for improved performance of and tolerance with functional mobility.  Baseline:  Goal status: 50% MET    PLAN:  PT FREQUENCY: 2x/week  PT DURATION: 5-6 weeks  PLANNED INTERVENTIONS: 97164-  PT Re-evaluation, 97750- Physical Performance Testing, 97110-Therapeutic exercises, 97530- Therapeutic activity, [id number]- Neuromuscular re-education, 97535- Self Care, 02859- Manual therapy, [id number]- Gait training, [phone]- Aquatic Therapy, [phone]- Electrical stimulation (unattended), 20560 (1-2 muscles), 20561 (3+ muscles)- Dry Needling, Patient/Family education, Balance training, Stair training, Taping, Cryotherapy, and Moist heat  PLAN FOR NEXT SESSION: Review and perform HEP, progress PRN.  R LE strengthening, proprioception, and gait.  Cont with STM/IASTM, percussion gun if this was helpful.   Cont with step activities. Continue STM to upper quad/hip  flexor. Prefers [employer] over [employer] PT, DPT 08/11/2024 9:47 PM

## 2024-08-11 ENCOUNTER — Encounter (HOSPITAL_BASED_OUTPATIENT_CLINIC_OR_DEPARTMENT_OTHER): Admitting: Physical Therapy

## 2024-08-13 ENCOUNTER — Ambulatory Visit (HOSPITAL_BASED_OUTPATIENT_CLINIC_OR_DEPARTMENT_OTHER)

## 2024-08-13 DIAGNOSIS — R262 Difficulty in walking, not elsewhere classified: Secondary | ICD-10-CM

## 2024-08-13 DIAGNOSIS — M79604 Pain in right leg: Secondary | ICD-10-CM | POA: Diagnosis not present

## 2024-08-13 DIAGNOSIS — M6281 Muscle weakness (generalized): Secondary | ICD-10-CM

## 2024-08-13 NOTE — Therapy (Signed)
 OUTPATIENT PHYSICAL THERAPY LOWER EXTREMITY TREATMENT       Patient Name: Cheryl Barnes MRN: [id number] DOB:March 04, 1951, 73 y.o., female Today's Date: 08/13/2024  END OF SESSION:  PT End of Session - 08/13/24 0923     Visit Number 17    Number of Visits 18    Date for Recertification  08/18/24    Authorization Type MCR A and B    Progress Note Due on Visit 18    PT Start Time 0932    PT Stop Time 1015    PT Time Calculation (min) 43 min    Activity Tolerance Patient tolerated treatment well    Behavior During Therapy WFL for tasks assessed/performed                    Past Medical History:  Diagnosis Date   Allergy    Arthritis    Back pain    Chest pain    Diverticulitis    High cholesterol    History of hiatal hernia    Hypertension    Joint pain    LBBB (left bundle branch block)    Migraine    history   Nonobstructive atherosclerosis of coronary artery    Osteoarthritis    Osteoarthritis, hip, bilateral    Past Surgical History:  Procedure Laterality Date   CESAREAN SECTION     x2   CHOLECYSTECTOMY     PARTIAL HYSTERECTOMY     Abdominal   ROTATOR CUFF REPAIR Left    ROTATOR CUFF REPAIR Right    TONSILLECTOMY     removed as a child   TOTAL HIP ARTHROPLASTY Right 05/13/2023   Procedure: RIGHT TOTAL HIP REPLACEMENT;  Surgeon: Jerri Kay HERO, MD;  Location: [facility] OR;  Service: Orthopedics;  Laterality: Right;  3-C   TYMPANOSTOMY TUBE PLACEMENT Right 2022   Patient Active Problem List   Diagnosis Date Noted   Abnormal food appetite 06/09/2024   Osteoporosis 05/16/2024   Insulin  resistance 03/03/2024   At increased risk for cardiovascular disease 03/03/2024   Iron deficiency anemia 02/10/2024   Abnormal glucose 02/10/2024   Statin intolerance - tried several in past, 2-3 years ago 12/16/2023   Tinnitus of both ears 11/04/2023   Status post total replacement of right hip 05/13/2023   Primary osteoarthritis of right hip 05/12/2023    Primary osteoarthritis of left hip 02/06/2023   Statin myopathy 10/24/2022   Atherosclerosis of aorta 10/17/2020   Nocturnal leg cramps 10/01/2018   Splenic artery aneurysm 03/01/2017   Thyroid  nodule 01/27/2017   Acute diverticulitis 01/24/2017   Recurrent herpes labialis 09/21/2016   Pure hypercholesterolemia 09/05/2016   Class 1 obesity with serious comorbidity and body mass index (BMI) of 31.0 to 31.9 in adult 09/05/2016   Bilateral hip pain 09/14/2015   Medicare annual wellness visit, subsequent 09/14/2015   Sigmoid diverticulitis 01/27/2015   LBBB (left bundle branch block) 12/23/2014   Hematochezia 12/23/2014   Abdominal pain    Diverticulitis of large intestine without perforation or abscess without bleeding    Chest pain    Solitary pulmonary nodule 06/09/2014   Left lower lobe pneumonia 10/06/2013   Urinary incontinence in female 03/02/2013   Postmenopause atrophic vaginitis 03/02/2013   RUQ PAIN 04/01/2007   Essential (primary) hypertension 03/24/2007   Diverticulosis of colon 03/17/2007     REFERRING PROVIDER: Jerri Kay HERO, MD   REFERRING DIAG: 8081689006 (ICD-10-CM) - Status post total replacement of right hip   THERAPY DIAG:  Pain in right leg  Muscle weakness (generalized)  Difficulty in walking, not elsewhere classified  Rationale for Evaluation and Treatment: Rehabilitation  ONSET DATE: DOS  05/13/2023  SUBJECTIVE:   SUBJECTIVE STATEMENT:  2/10 pain level at entry.    PERTINENT HISTORY: R THA anterior approach on 05/13/23 Osteoporosis OA including L hip Bilat RCR, HTN  PAIN:  2/10 pain in R hip and thigh which she thinks is from the cold weather     PRECAUTIONS: Other: R THA with anterior approach, osteoporosis   WEIGHT BEARING RESTRICTIONS: No  FALLS:  Has patient fallen in last 6 months? Yes. Number of falls 2.  Trying to stop a soccer ball and bending over trying to move a rug  LIVING ENVIRONMENT: Lives with: lives alone Lives  in: 1 story home Stairs: 4 steps to enter home with rails    PLOF: Independent  PATIENT GOALS: improved performance of car transfers and stairs.  To be able to perform stairs normally.  Improve strength.    OBJECTIVE:  Note: Objective measures were completed at Evaluation unless otherwise noted.  DIAGNOSTIC FINDINGS: X rays of R hip:  stable R THR without complication.  PATIENT SURVEYS:  LEFS:  14/80 (Initial) LEFS:   41/80 on 07/07/24  COGNITION: Overall cognitive status: Within functional limits for tasks assessed      PALPATION: TTP:  R LE:  lateral thigh/ ITB,  mid to distal central thigh   LOWER EXTREMITY ROM:  Active ROM Right eval Left eval Right 11/11  Hip flexion     Hip extension     Hip abduction     Hip adduction     Hip internal rotation     Hip external rotation     Knee flexion 106 122 116  Knee extension     Ankle dorsiflexion     Ankle plantarflexion     Ankle inversion     Ankle eversion      (Blank rows = not tested)  LOWER EXTREMITY MMT:  MMT Right eval Left eval Right 11/11  Hip flexion Tolerated min resistance With pain; 19.8 5/5; 25.2 5/5 24.7  Hip extension     Hip abduction 11.2 29.2 21.4  Hip adduction     Hip internal rotation     Hip external rotation     Knee flexion 4+/5 with pain 5/5 5/5  Knee extension 4-/5 with pain 5/5 4/5  Ankle dorsiflexion     Ankle plantarflexion     Ankle inversion     Ankle eversion      (Blank rows = not tested)   FUNCTIONAL TESTS:  5x STS test:  15.76 sec (initial) 5x STS test:  07/07/24:  10.11 sec  GAIT:  Comments:  Pt has a heel to toe gait pattern.  Improved quality of gait with reduced limp.  Pt favors R LE though had no significant limp.  TREATMENT:    08/13/24 Nustep Lvl 4 x 5 mins bilat UE/Les Hooklying marching 2# 2x10ea SLR with assist x3  (pain so stopped) IASTM using percussion gun to hip flexor/proximal quad LAQ 3# 5 2x10  Step ups 6 inch 2x10 with rail fwd and lateral STS from table with 5# KB 2x10  Manual Therapy: Percussion gun to anterior hip/thigh  08/10/24 Nustep Lvl 4 x 5 mins bilat UE/Les Step ups 6 inch 2x10 with rail Step downs 4 inch 2x10 with rail Standing hip abduction 2# 2x10 bilat Standing hip hiking x 10 reps with UE support on rail STS from table with 5# KB 2x10  Manual Therapy: STM and TPR to R quad and ITB. STM to anterior hip.    08/06/24  Scifit bike L3x8 minutes, seat 3 for w/u (Nustep not available) Shuttle BLE press 50# x10 Shuttle single LE press L LE 25# x10, R LE   Staggered bridges 2x10 switching feet each set Walking bridges 2x5  Forward step ups 6 inch box x15 B  Forward step downs 2x12 from 4 inch box, R LE only  Tennis ball massage proximal quad/hip flexor R, gave her tennis ball with home instructions     12/08  Nustep L5x5 minutes all four extremities for 4 minutes Forward step ups 6 inch box 2x12 B Forward step downs 4 inch box 2x10 R only IASTM using roller to R thigh SAQ/SLR trial in supine with black wedge under knees Bridges 2x10 Goblet hold 5# STS 2x10 from mat table Standing hip ABD green TB at ankles 2x10bil SLS with fingertip support 5x15 seconds R LE HR 2x15 (more weight through R LE)    12/02  Nustep L5x8 minutes all four extremities for 4 minutes, then dropped to just LEs for the next 4 minutes   Forward step ups 6 inch box 2x12 B Forward step downs 4 inch box 2x8 B  Goblet hold 5# STS x12 from mat table Standing hip ABD green TB above knees x12 B  Tandem stance blue foam 6x30 seconds alternating Tandem gait forward/backward x4 laps    11/24 Nustep lvl 4 x bilat UE/Les though pt started just using LE's toward the end Step ups with R LE, step downs with L LE leading 4 inch step x10, 6 inch 2x10 with UE support Lateral step ups/overs  on 4 inch 2x10 with UE support Sit to stands x10 (R LE slighlty posterior) LAQ 3# 2x10 Supine manual R HSS 2x30 sec  Manual Therapy:  STM and TPR to R quad and ITB.  STM to anterior hip.   11/20 Nustep lvl 4 x bilat UE/Les Step ups 4 x10, 6 2x10 Step downs 4 inch 2x10 Lateral step up/overs  on 4 inch step x10 Standing marching 2# ankle weights 2x10 (cues for posture) Standing hip abduction/extension 2# ankle weights 2x10ea/bil Sit to stands 2x10 (R LE slighlty posterior) Gait in hall x246ft Supine R HSS 2x30sec Piriformis stretch 30sec x3 R   Manual Therapy:  STM to R hip flexors (proximal) in supine   11/18 Nustep lvl 3-4 x bilat UE/Les Step ups x 5 reps on 4 inch step bilat LE's, x10 reps on 6 inch step Step downs 4 inch x 10 R LE, 6 inch x 10 reps bilat LE's Lateral step ups on 4 inch step LAQ  2# x10, 15 Supine bridge with RTB around LE's 2x15  Manual Therapy:  STM and TPR to R quad and ITB.  PATIENT EDUCATION:  Education details: dx, exercise form, relevant anatomy, HEP, POC, and rationale of interventions.  PT answered pt's questions.   Person educated: Patient Education method: Explanation, Demonstration, Tactile cues, Verbal cues Education comprehension: verbalized understanding, returned demonstration, verbal cues required, tactile cues required, and needs further education  HOME EXERCISE PROGRAM:  Access Code: [email or web address] URL: [email or web address] Date: 06/24/2024 Prepared by: Josette Rough  Exercises - Supine Quad Set  - 1-2 x daily - 7 x weekly - 3 sets - 10 reps - 3seconds hold - Seated Long Arc Quad  - 1 x daily - 7 x weekly - 2 sets - 10 reps - Supine Bridge  - 1 x daily - 7 x weekly - 2 sets - 10 reps - Clamshell  - 1-2 x daily - 7 x weekly - 2 sets - 10 reps - Supine Knee Extension Strengthening  - 1-2 x daily - 7 x weekly - 1-2 sets - 10 reps - 5 seconds hold - Sidelying Hip Flexion  - 1 x daily - 7 x weekly  - 3 sets - 10 reps     ASSESSMENT:  CLINICAL IMPRESSION:  Pt reported benefit from use of percussion gun to anterior hip musculature and states she may try to purchase one for home use. She is unable to complete SLR, even with assistance, due to discomfort in proximal anterior hip. She had good tolerance for all of other tasks. Mild instability observed with step ups and pt did require use of hand rail for balance. Will continue to progress as tolerated.    OBJECTIVE IMPAIRMENTS: Abnormal gait, decreased activity tolerance, decreased endurance, decreased mobility, difficulty walking, decreased ROM, decreased strength, and pain.   ACTIVITY LIMITATIONS: carrying, squatting, stairs, transfers, and locomotion level  PARTICIPATION LIMITATIONS: cleaning, shopping, and community activity  PERSONAL FACTORS: 1-2 comorbidities: osteoporosis, L hip OA are also affecting patient's functional outcome.   REHAB POTENTIAL: Good  CLINICAL DECISION MAKING: Stable/uncomplicated  EVALUATION COMPLEXITY: Low   GOALS:  SHORT TERM GOALS: Target date: 06/15/24  Pt will be independent and compliant with HEP for improved pain, strength, and function.  Baseline: Goal status: MET 06/24/24  2.  Pt will report at least a 25% improvement in pain and sx's overall.   Baseline:  Goal status: MET 06/24/24  3.  Pt will report at least a 50% improvement in lifting her LE for improved dressing and performance of car transfers.  Baseline:  Goal status:  GOAL MET  11/11     LONG TERM GOALS: Target date: 08/18/2024  Pt will ambulate without limping.  Baseline:  Goal status: PROGRESSING  2.  Pt will be able to perform stairs with a  reciprocal gait with a rail.  Baseline:  Goal status: NOT MET  3.  Pt will be able to perform car transfers without difficulty.  Baseline:  Goal status:  PROGRESSING  4.  Pt will report she is able to ambulate community distance without difficulty and increased pain.   Baseline:  Goal status: ONGOING  5.  Pt will demo improved R knee strength to 5/5 MMT and at least a 10# increase in R hip abd for improved performance of and tolerance with functional mobility.  Baseline:  Goal status: 50% MET    PLAN:  PT FREQUENCY: 2x/week  PT DURATION: 5-6 weeks  PLANNED INTERVENTIONS: [id number]- PT Re-evaluation, 97750- Physical Performance Testing, 97110-Therapeutic exercises, 97530- Therapeutic activity, [id number]- Neuromuscular re-education, 97535- Self Care, [id number]- Manual therapy, [id number]- Gait training, [phone]- Aquatic Therapy,  [id number]- Electrical stimulation (unattended), 20560 (1-2 muscles), 20561 (3+ muscles)- Dry Needling, Patient/Family education, Balance training, Stair training, Taping, Cryotherapy, and Moist heat  PLAN FOR NEXT SESSION: Review and perform HEP, progress PRN.  R LE strengthening, proprioception, and gait.  Cont with STM/IASTM, percussion gun if this was helpful.   Cont with step activities. Continue STM to upper quad/hip flexor. Prefers [employer] over [employer], [place]  08/13/2024 11:37 AM

## 2024-08-18 ENCOUNTER — Encounter (HOSPITAL_BASED_OUTPATIENT_CLINIC_OR_DEPARTMENT_OTHER): Admitting: Physical Therapy

## 2024-08-18 DIAGNOSIS — R262 Difficulty in walking, not elsewhere classified: Secondary | ICD-10-CM

## 2024-08-18 DIAGNOSIS — M79604 Pain in right leg: Secondary | ICD-10-CM

## 2024-08-18 DIAGNOSIS — M6281 Muscle weakness (generalized): Secondary | ICD-10-CM

## 2024-08-18 NOTE — Therapy (Addendum)
 " OUTPATIENT PHYSICAL THERAPY LOWER EXTREMITY TREATMENT - Re-cert   Progress Note Reporting Period 05/25/24 to 08/18/24  See note below for Objective Data and Assessment of Progress/Goals.         Patient Name: Cheryl Barnes MRN: [id number] DOB:03-08-1951, 73 y.o., female Today's Date: 08/18/2024  END OF SESSION:  PT End of Session - 08/18/24 1005     Visit Number 18    Date for Recertification  10/02/24    Authorization Type MCR A and B    Progress Note Due on Visit 30    PT Start Time 0930    PT Stop Time 1015    PT Time Calculation (min) 45 min    Activity Tolerance Patient tolerated treatment well    Behavior During Therapy WFL for tasks assessed/performed                     Past Medical History:  Diagnosis Date   Allergy    Arthritis    Back pain    Chest pain    Diverticulitis    High cholesterol    History of hiatal hernia    Hypertension    Joint pain    LBBB (left bundle branch block)    Migraine    history   Nonobstructive atherosclerosis of coronary artery    Osteoarthritis    Osteoarthritis, hip, bilateral    Past Surgical History:  Procedure Laterality Date   CESAREAN SECTION     x2   CHOLECYSTECTOMY     PARTIAL HYSTERECTOMY     Abdominal   ROTATOR CUFF REPAIR Left    ROTATOR CUFF REPAIR Right    TONSILLECTOMY     removed as a child   TOTAL HIP ARTHROPLASTY Right 05/13/2023   Procedure: RIGHT TOTAL HIP REPLACEMENT;  Surgeon: Jerri Kay HERO, MD;  Location: [facility] OR;  Service: Orthopedics;  Laterality: Right;  3-C   TYMPANOSTOMY TUBE PLACEMENT Right 2022   Patient Active Problem List   Diagnosis Date Noted   Abnormal food appetite 06/09/2024   Osteoporosis 05/16/2024   Insulin  resistance 03/03/2024   At increased risk for cardiovascular disease 03/03/2024   Iron deficiency anemia 02/10/2024   Abnormal glucose 02/10/2024   Statin intolerance - tried several in past, 2-3 years ago 12/16/2023   Tinnitus of both ears  11/04/2023   Status post total replacement of right hip 05/13/2023   Primary osteoarthritis of right hip 05/12/2023   Primary osteoarthritis of left hip 02/06/2023   Statin myopathy 10/24/2022   Atherosclerosis of aorta 10/17/2020   Nocturnal leg cramps 10/01/2018   Splenic artery aneurysm 03/01/2017   Thyroid  nodule 01/27/2017   Acute diverticulitis 01/24/2017   Recurrent herpes labialis 09/21/2016   Pure hypercholesterolemia 09/05/2016   Class 1 obesity with serious comorbidity and body mass index (BMI) of 31.0 to 31.9 in adult 09/05/2016   Bilateral hip pain 09/14/2015   Medicare annual wellness visit, subsequent 09/14/2015   Sigmoid diverticulitis 01/27/2015   LBBB (left bundle branch block) 12/23/2014   Hematochezia 12/23/2014   Abdominal pain    Diverticulitis of large intestine without perforation or abscess without bleeding    Chest pain    Solitary pulmonary nodule 06/09/2014   Left lower lobe pneumonia 10/06/2013   Urinary incontinence in female 03/02/2013   Postmenopause atrophic vaginitis 03/02/2013   RUQ PAIN 04/01/2007   Essential (primary) hypertension 03/24/2007   Diverticulosis of colon 03/17/2007     REFERRING PROVIDER: Jerri Kay HERO, MD  REFERRING DIAG: S03.358 (ICD-10-CM) - Status post total replacement of right hip   THERAPY DIAG:  Pain in right leg - Plan: PT plan of care cert/re-cert  Muscle weakness (generalized) - Plan: PT plan of care cert/re-cert  Difficulty in walking, not elsewhere classified - Plan: PT plan of care cert/re-cert  Rationale for Evaluation and Treatment: Rehabilitation  ONSET DATE: DOS  05/13/2023  SUBJECTIVE:   SUBJECTIVE STATEMENT:  I am at a 4/10 pain today    PERTINENT HISTORY: R THA anterior approach on 05/13/23 Osteoporosis OA including L hip Bilat RCR, HTN  PAIN:  2/10 pain in R hip and thigh which she thinks is from the cold weather     PRECAUTIONS: Other: R THA with anterior approach,  osteoporosis   WEIGHT BEARING RESTRICTIONS: No  FALLS:  Has patient fallen in last 6 months? Yes. Number of falls 2.  Trying to stop a soccer ball and bending over trying to move a rug  LIVING ENVIRONMENT: Lives with: lives alone Lives in: 1 story home Stairs: 4 steps to enter home with rails    PLOF: Independent  PATIENT GOALS: improved performance of car transfers and stairs.  To be able to perform stairs normally.  Improve strength.    OBJECTIVE:  Note: Objective measures were completed at Evaluation unless otherwise noted.  DIAGNOSTIC FINDINGS: X rays of R hip:  stable R THR without complication.  PATIENT SURVEYS:  LEFS:  14/80 (Initial) LEFS:   41/80 on 07/07/24  COGNITION: Overall cognitive status: Within functional limits for tasks assessed      PALPATION: TTP:  R LE:  lateral thigh/ ITB,  mid to distal central thigh   LOWER EXTREMITY ROM:  Active ROM Right eval Left eval Right 11/11 R 12/23  Hip flexion      Hip extension      Hip abduction      Hip adduction      Hip internal rotation      Hip external rotation      Knee flexion 106 122 116 116 P!  Knee extension      Ankle dorsiflexion      Ankle plantarflexion      Ankle inversion      Ankle eversion       (Blank rows = not tested)  LOWER EXTREMITY MMT:  MMT Right eval Left eval Right 11/11  Hip flexion Tolerated min resistance With pain; 19.8 5/5; 25.2 5/5 24.7  Hip extension     Hip abduction 11.2 29.2 21.4  Hip adduction     Hip internal rotation     Hip external rotation     Knee flexion 4+/5 with pain 5/5 5/5  Knee extension 4-/5 with pain 5/5 4/5  Ankle dorsiflexion     Ankle plantarflexion     Ankle inversion     Ankle eversion      (Blank rows = not tested)   FUNCTIONAL TESTS:  5x STS test:  15.76 sec (initial) 5x STS test:  07/07/24:  10.11 sec  GAIT:  Comments:  Pt has a heel to toe gait pattern.  Improved quality of gait with reduced limp.  Pt favors R LE  though had no significant limp.  TREATMENT:   08/18/24 Nustep Lvl 4 x 5 mins bilat UE/Les STM to quad and hip flexor to help decrease pain and tension present Inferior hip mobs Grde III Modified thomas stretch  STS from table with 5# KB 2x10 Standing HS curl with hip ext 1.5#    Manual Therapy: Percussion gun to anterior hip/thigh 08/13/24 Nustep Lvl 4 x 5 mins bilat UE/Les Hooklying marching 2# 2x10ea SLR with assist x3 (pain so stopped) IASTM using percussion gun to hip flexor/proximal quad LAQ 3# 5 2x10  Step ups 6 inch 2x10 with rail fwd and lateral STS from table with 5# KB 2x10  Manual Therapy: Percussion gun to anterior hip/thigh  08/10/24 Nustep Lvl 4 x 5 mins bilat UE/Les Step ups 6 inch 2x10 with rail Step downs 4 inch 2x10 with rail Standing hip abduction 2# 2x10 bilat Standing hip hiking x 10 reps with UE support on rail STS from table with 5# KB 2x10  Manual Therapy: STM and TPR to R quad and ITB. STM to anterior hip.    08/06/24  [employer] bike L3x8 minutes, seat 3 for w/u (Nustep not available) Shuttle BLE press 50# x10 Shuttle single LE press L LE 25# x10, R LE   Staggered bridges 2x10 switching feet each set Walking bridges 2x5  Forward step ups 6 inch box x15 B  Forward step downs 2x12 from 4 inch box, R LE only  Tennis ball massage proximal quad/hip flexor R, gave her tennis ball with home instructions     12/08  Nustep L5x5 minutes all four extremities for 4 minutes Forward step ups 6 inch box 2x12 B Forward step downs 4 inch box 2x10 R only IASTM using roller to R thigh SAQ/SLR trial in supine with black wedge under knees Bridges 2x10 Goblet hold 5# STS 2x10 from mat table Standing hip ABD green TB at ankles 2x10bil SLS with fingertip support 5x15 seconds R LE HR 2x15 (more weight through R  LE)    12/02  Nustep L5x8 minutes all four extremities for 4 minutes, then dropped to just LEs for the next 4 minutes   Forward step ups 6 inch box 2x12 B Forward step downs 4 inch box 2x8 B  Goblet hold 5# STS x12 from mat table Standing hip ABD green TB above knees x12 B  Tandem stance blue foam 6x30 seconds alternating Tandem gait forward/backward x4 laps    PATIENT EDUCATION:  Education details: dx, exercise form, relevant anatomy, HEP, POC, and rationale of interventions.  PT answered pt's questions.   Person educated: Patient Education method: Explanation, Demonstration, Tactile cues, Verbal cues Education comprehension: verbalized understanding, returned demonstration, verbal cues required, tactile cues required, and needs further education  HOME EXERCISE PROGRAM:  Access Code: [email or web address] URL: [email or web address] Date: 06/24/2024 Prepared by: Josette Rough  Exercises - Supine Quad Set  - 1-2 x daily - 7 x weekly - 3 sets - 10 reps - 3seconds hold - Seated Long Arc Quad  - 1 x daily - 7 x weekly - 2 sets - 10 reps - Supine Bridge  - 1 x daily - 7 x weekly - 2 sets - 10 reps - Clamshell  - 1-2 x daily - 7 x weekly - 2 sets - 10 reps - Supine Knee Extension Strengthening  - 1-2 x daily - 7 x weekly - 1-2 sets - 10 reps - 5 seconds hold - Sidelying Hip Flexion  - 1 x daily - 7 x weekly - 3 sets -  10 reps     ASSESSMENT:  CLINICAL IMPRESSION:  Pt with increased tension present in her quad and anterior hip joint. Pt with relief and decreased pain after manual today. She would benefit from increased posterior chain recruitment. She tolerated session well today with no increase in pain and slight fatigue noted. Pt provided an updated HEP. Pt has made progress since her start of PT, but continues to struggle with full ROM secondary to pain, and tissue tension. She also demonstrates continued weakness especially in her quad and posterior chain limiting her  ability to progress with functional movements such as sit to stands and stairs. Pt will continue to benefit from skilled PT to address continued deficits.    OBJECTIVE IMPAIRMENTS: Abnormal gait, decreased activity tolerance, decreased endurance, decreased mobility, difficulty walking, decreased ROM, decreased strength, and pain.   ACTIVITY LIMITATIONS: carrying, squatting, stairs, transfers, and locomotion level  PARTICIPATION LIMITATIONS: cleaning, shopping, and community activity  PERSONAL FACTORS: 1-2 comorbidities: osteoporosis, L hip OA are also affecting patient's functional outcome.   REHAB POTENTIAL: Good  CLINICAL DECISION MAKING: Stable/uncomplicated  EVALUATION COMPLEXITY: Low   GOALS:  SHORT TERM GOALS: Target date: 06/15/24  Pt will be independent and compliant with HEP for improved pain, strength, and function.  Baseline: Goal status: MET 06/24/24  2.  Pt will report at least a 25% improvement in pain and sx's overall.   Baseline:  Goal status: MET 06/24/24  3.  Pt will report at least a 50% improvement in lifting her LE for improved dressing and performance of car transfers.  Baseline:  Goal status:  GOAL MET  11/11     LONG TERM GOALS: Target date: POC date   Pt will ambulate without limping.  Baseline:  Goal status: PROGRESSING  2.  Pt will be able to perform stairs with a  reciprocal gait with a rail.  Baseline:  Goal status: Progressing   3.  Pt will be able to perform car transfers without difficulty.  Baseline:  Goal status:  PROGRESSING  4.  Pt will report she is able to ambulate community distance without difficulty and increased pain.  Baseline:  Goal status: ONGOING  5.  Pt will demo improved R knee strength to 5/5 MMT and at least a 10# increase in R hip abd for improved performance of and tolerance with functional mobility.  Baseline:  Goal status: 50% MET    PLAN:  PT FREQUENCY: 2x/week  PT DURATION: 5-6 weeks  PLANNED  INTERVENTIONS: 97164- PT Re-evaluation, 97750- Physical Performance Testing, 97110-Therapeutic exercises, 97530- Therapeutic activity, [id number]- Neuromuscular re-education, 97535- Self Care, [id number]- Manual therapy, [id number]- Gait training, [phone]- Aquatic Therapy, [phone]- Electrical stimulation (unattended), 20560 (1-2 muscles), 20561 (3+ muscles)- Dry Needling, Patient/Family education, Balance training, Stair training, Taping, Cryotherapy, and Moist heat  PLAN FOR NEXT SESSION: Review and perform HEP, progress PRN.  R LE strengthening, proprioception, and gait.  Cont with STM/IASTM, percussion gun if this was helpful.   Cont with step activities. Continue STM to upper quad/hip flexor. Prefers [employer] over [employer] PT, DPT 08/18/2024  10:18 AM                "

## 2024-08-25 ENCOUNTER — Encounter (HOSPITAL_BASED_OUTPATIENT_CLINIC_OR_DEPARTMENT_OTHER): Payer: Self-pay | Admitting: Physical Therapy

## 2024-08-25 ENCOUNTER — Ambulatory Visit (HOSPITAL_BASED_OUTPATIENT_CLINIC_OR_DEPARTMENT_OTHER): Admitting: Physical Therapy

## 2024-08-25 DIAGNOSIS — M79604 Pain in right leg: Secondary | ICD-10-CM | POA: Diagnosis not present

## 2024-08-25 DIAGNOSIS — R262 Difficulty in walking, not elsewhere classified: Secondary | ICD-10-CM

## 2024-08-25 DIAGNOSIS — M6281 Muscle weakness (generalized): Secondary | ICD-10-CM

## 2024-08-25 NOTE — Therapy (Signed)
 " OUTPATIENT PHYSICAL THERAPY LOWER EXTREMITY TREATMENT     Patient Name: Cheryl Barnes MRN: [id number] DOB:[birth year], 73 y.o., female Today's Date: 08/25/2024  END OF SESSION:  PT End of Session - 08/25/24 0932     Visit Number 19    Number of Visits 19    Date for Recertification  10/02/24    Authorization Type MCR A and B    Progress Note Due on Visit 30    PT Start Time 0932    PT Stop Time 1013    PT Time Calculation (min) 41 min    Activity Tolerance Patient tolerated treatment well    Behavior During Therapy WFL for tasks assessed/performed                      Past Medical History:  Diagnosis Date   Allergy    Arthritis    Back pain    Chest pain    Diverticulitis    High cholesterol    History of hiatal hernia    Hypertension    Joint pain    LBBB (left bundle branch block)    Migraine    history   Nonobstructive atherosclerosis of coronary artery    Osteoarthritis    Osteoarthritis, hip, bilateral    Past Surgical History:  Procedure Laterality Date   CESAREAN SECTION     x2   CHOLECYSTECTOMY     PARTIAL HYSTERECTOMY     Abdominal   ROTATOR CUFF REPAIR Left    ROTATOR CUFF REPAIR Right    TONSILLECTOMY     removed as a child   TOTAL HIP ARTHROPLASTY Right 05/13/2023   Procedure: RIGHT TOTAL HIP REPLACEMENT;  Surgeon: Jerri Kay HERO, MD;  Location: [facility] OR;  Service: Orthopedics;  Laterality: Right;  3-C   TYMPANOSTOMY TUBE PLACEMENT Right 2022   Patient Active Problem List   Diagnosis Date Noted   Abnormal food appetite 06/09/2024   Osteoporosis 05/16/2024   Insulin  resistance 03/03/2024   At increased risk for cardiovascular disease 03/03/2024   Iron deficiency anemia 02/10/2024   Abnormal glucose 02/10/2024   Statin intolerance - tried several in past, 2-3 years ago 12/16/2023   Tinnitus of both ears 11/04/2023   Status post total replacement of right hip 05/13/2023   Primary osteoarthritis of right hip 05/12/2023    Primary osteoarthritis of left hip 02/06/2023   Statin myopathy 10/24/2022   Atherosclerosis of aorta 10/17/2020   Nocturnal leg cramps 10/01/2018   Splenic artery aneurysm 03/01/2017   Thyroid  nodule 01/27/2017   Acute diverticulitis 01/24/2017   Recurrent herpes labialis 09/21/2016   Pure hypercholesterolemia 09/05/2016   Class 1 obesity with serious comorbidity and body mass index (BMI) of 31.0 to 31.9 in adult 09/05/2016   Bilateral hip pain 09/14/2015   Medicare annual wellness visit, subsequent 09/14/2015   Sigmoid diverticulitis 01/27/2015   LBBB (left bundle branch block) 12/23/2014   Hematochezia 12/23/2014   Abdominal pain    Diverticulitis of large intestine without perforation or abscess without bleeding    Chest pain    Solitary pulmonary nodule 06/09/2014   Left lower lobe pneumonia 10/06/2013   Urinary incontinence in female 03/02/2013   Postmenopause atrophic vaginitis 03/02/2013   RUQ PAIN 04/01/2007   Essential (primary) hypertension 03/24/2007   Diverticulosis of colon 03/17/2007     REFERRING PROVIDER: Jerri Kay HERO, MD   REFERRING DIAG: [phone] (ICD-10-CM) - Status post total replacement of right hip   THERAPY DIAG:  Pain in right leg  Muscle weakness (generalized)  Difficulty in walking, not elsewhere classified  Rationale for Evaluation and Treatment: Rehabilitation  ONSET DATE: DOS  05/13/2023  SUBJECTIVE:   SUBJECTIVE STATEMENT:  I am at a 2/10 pain today    PERTINENT HISTORY: R THA anterior approach on 05/13/23 Osteoporosis OA including L hip Bilat RCR, HTN  PAIN:  2/10 pain in R hip and thigh which she thinks is from the cold weather     PRECAUTIONS: Other: R THA with anterior approach, osteoporosis   WEIGHT BEARING RESTRICTIONS: No  FALLS:  Has patient fallen in last 6 months? Yes. Number of falls 2.  Trying to stop a soccer ball and bending over trying to move a rug  LIVING ENVIRONMENT: Lives with: lives alone Lives in:  1 story home Stairs: 4 steps to enter home with rails    PLOF: Independent  PATIENT GOALS: improved performance of car transfers and stairs.  To be able to perform stairs normally.  Improve strength.    OBJECTIVE:  Note: Objective measures were completed at Evaluation unless otherwise noted.  DIAGNOSTIC FINDINGS: X rays of R hip:  stable R THR without complication.  PATIENT SURVEYS:  LEFS:  14/80 (Initial) LEFS:   41/80 on 07/07/24  COGNITION: Overall cognitive status: Within functional limits for tasks assessed      PALPATION: TTP:  R LE:  lateral thigh/ ITB,  mid to distal central thigh   LOWER EXTREMITY ROM:  Active ROM Right eval Left eval Right 11/11 R 12/23  Hip flexion      Hip extension      Hip abduction      Hip adduction      Hip internal rotation      Hip external rotation      Knee flexion 106 122 116 116 P!  Knee extension      Ankle dorsiflexion      Ankle plantarflexion      Ankle inversion      Ankle eversion       (Blank rows = not tested)  LOWER EXTREMITY MMT:  MMT Right eval Left eval Right 11/11  Hip flexion Tolerated min resistance With pain; 19.8 5/5; 25.2 5/5 24.7  Hip extension     Hip abduction 11.2 29.2 21.4  Hip adduction     Hip internal rotation     Hip external rotation     Knee flexion 4+/5 with pain 5/5 5/5  Knee extension 4-/5 with pain 5/5 4/5  Ankle dorsiflexion     Ankle plantarflexion     Ankle inversion     Ankle eversion      (Blank rows = not tested)   FUNCTIONAL TESTS:  5x STS test:  15.76 sec (initial) 5x STS test:  07/07/24:  10.11 sec  GAIT:  Comments:  Pt has a heel to toe gait pattern.  Improved quality of gait with reduced limp.  Pt favors R LE though had no significant limp.  TREATMENT: 08/25/24 Nustep Lvl 4 x 5 mins bilat UE/Les ROM assessment  IASTYM and STM  to hip flexor, IT band and quad LAQ  Quad sets    08/18/24 Nustep Lvl 4 x 5 mins bilat UE/Les STM to quad and hip flexor to help decrease pain and tension present Inferior hip mobs Grde III Modified thomas stretch  STS from table with 5# KB 2x10 Standing HS curl with hip ext 1.5#    Manual Therapy: Percussion gun to anterior hip/thigh 08/13/24 Nustep Lvl 4 x 5 mins bilat UE/Les Hooklying marching 2# 2x10ea SLR with assist x3 (pain so stopped) IASTM using percussion gun to hip flexor/proximal quad LAQ 3# 5 2x10  Step ups 6 inch 2x10 with rail fwd and lateral STS from table with 5# KB 2x10  Manual Therapy: Percussion gun to anterior hip/thigh  08/10/24 Nustep Lvl 4 x 5 mins bilat UE/Les Step ups 6 inch 2x10 with rail Step downs 4 inch 2x10 with rail Standing hip abduction 2# 2x10 bilat Standing hip hiking x 10 reps with UE support on rail STS from table with 5# KB 2x10  Manual Therapy: STM and TPR to R quad and ITB. STM to anterior hip.    08/06/24  [employer] bike L3x8 minutes, seat 3 for w/u (Nustep not available) Shuttle BLE press 50# x10 Shuttle single LE press L LE 25# x10, R LE   Staggered bridges 2x10 switching feet each set Walking bridges 2x5  Forward step ups 6 inch box x15 B  Forward step downs 2x12 from 4 inch box, R LE only  Tennis ball massage proximal quad/hip flexor R, gave her tennis ball with home instructions     12/08  Nustep L5x5 minutes all four extremities for 4 minutes Forward step ups 6 inch box 2x12 B Forward step downs 4 inch box 2x10 R only IASTM using roller to R thigh SAQ/SLR trial in supine with black wedge under knees Bridges 2x10 Goblet hold 5# STS 2x10 from mat table Standing hip ABD green TB at ankles 2x10bil SLS with fingertip support 5x15 seconds R LE HR 2x15 (more weight through R LE)    12/02  Nustep L5x8 minutes all four extremities for 4 minutes, then dropped to just LEs for the next 4 minutes   Forward step  ups 6 inch box 2x12 B Forward step downs 4 inch box 2x8 B  Goblet hold 5# STS x12 from mat table Standing hip ABD green TB above knees x12 B  Tandem stance blue foam 6x30 seconds alternating Tandem gait forward/backward x4 laps    PATIENT EDUCATION:  Education details: dx, exercise form, relevant anatomy, HEP, POC, and rationale of interventions.  PT answered pt's questions.   Person educated: Patient Education method: Explanation, Demonstration, Tactile cues, Verbal cues Education comprehension: verbalized understanding, returned demonstration, verbal cues required, tactile cues required, and needs further education  HOME EXERCISE PROGRAM:  Access Code: [email or web address] URL: [email or web address] Date: 06/24/2024 Prepared by: Josette Rough  Exercises - Supine Quad Set  - 1-2 x daily - 7 x weekly - 3 sets - 10 reps - 3seconds hold - Seated Long Arc Quad  - 1 x daily - 7 x weekly - 2 sets - 10 reps - Supine Bridge  - 1 x daily - 7 x weekly - 2 sets - 10 reps - Clamshell  - 1-2 x daily - 7 x weekly - 2 sets - 10 reps - Supine Knee Extension Strengthening  - 1-2 x daily - 7  x weekly - 1-2 sets - 10 reps - 5 seconds hold - Sidelying Hip Flexion  - 1 x daily - 7 x weekly - 3 sets - 10 reps     ASSESSMENT:  CLINICAL IMPRESSION:  Pt reports continued trouble with getting in/out of car and stair negotiation. STM and IASTYM performed to restrictions noted in anterior hip musculature with significant improvements noted with test/ retest. Pt reports no pain and is able to perform hip IR/ER and hip flexion within functional range. She has a lag in PT due to lack of openings, so encouraged pt to utilize heat, theragun and continue with strengthening as able. She would benefit from IASTM next session followed by strengthening within new available range to help facilitate functional movements.    OBJECTIVE IMPAIRMENTS: Abnormal gait, decreased activity tolerance, decreased endurance,  decreased mobility, difficulty walking, decreased ROM, decreased strength, and pain.   ACTIVITY LIMITATIONS: carrying, squatting, stairs, transfers, and locomotion level  PARTICIPATION LIMITATIONS: cleaning, shopping, and community activity  PERSONAL FACTORS: 1-2 comorbidities: osteoporosis, L hip OA are also affecting patient's functional outcome.   REHAB POTENTIAL: Good  CLINICAL DECISION MAKING: Stable/uncomplicated  EVALUATION COMPLEXITY: Low   GOALS:  SHORT TERM GOALS: Target date: 06/15/24  Pt will be independent and compliant with HEP for improved pain, strength, and function.  Baseline: Goal status: MET 06/24/24  2.  Pt will report at least a 25% improvement in pain and sx's overall.   Baseline:  Goal status: MET 06/24/24  3.  Pt will report at least a 50% improvement in lifting her LE for improved dressing and performance of car transfers.  Baseline:  Goal status:  GOAL MET  11/11     LONG TERM GOALS: Target date: POC date   Pt will ambulate without limping.  Baseline:  Goal status: PROGRESSING  2.  Pt will be able to perform stairs with a  reciprocal gait with a rail.  Baseline:  Goal status: Progressing   3.  Pt will be able to perform car transfers without difficulty.  Baseline:  Goal status:  PROGRESSING  4.  Pt will report she is able to ambulate community distance without difficulty and increased pain.  Baseline:  Goal status: ONGOING  5.  Pt will demo improved R knee strength to 5/5 MMT and at least a 10# increase in R hip abd for improved performance of and tolerance with functional mobility.  Baseline:  Goal status: 50% MET    PLAN:  PT FREQUENCY: 2x/week  PT DURATION: 5-6 weeks  PLANNED INTERVENTIONS: 97164- PT Re-evaluation, 97750- Physical Performance Testing, 97110-Therapeutic exercises, 97530- Therapeutic activity, [id number]- Neuromuscular re-education, 97535- Self Care, 02859- Manual therapy, [id number]- Gait training, [phone]- Aquatic  Therapy, [phone]- Electrical stimulation (unattended), 20560 (1-2 muscles), 20561 (3+ muscles)- Dry Needling, Patient/Family education, Balance training, Stair training, Taping, Cryotherapy, and Moist heat  PLAN FOR NEXT SESSION: IASTYM to anterior hip and global strengthening.   Rojean Batten PT, DPT 08/25/2024  11:38 AM                "

## 2024-08-26 ENCOUNTER — Other Ambulatory Visit: Payer: Self-pay | Admitting: Family Medicine

## 2024-08-26 DIAGNOSIS — Z1231 Encounter for screening mammogram for malignant neoplasm of breast: Secondary | ICD-10-CM

## 2024-09-01 ENCOUNTER — Other Ambulatory Visit: Payer: Self-pay | Admitting: Orthopaedic Surgery

## 2024-09-01 ENCOUNTER — Other Ambulatory Visit: Payer: Self-pay | Admitting: Physician Assistant

## 2024-09-17 ENCOUNTER — Ambulatory Visit (INDEPENDENT_AMBULATORY_CARE_PROVIDER_SITE_OTHER): Admitting: Internal Medicine

## 2024-09-17 ENCOUNTER — Encounter (INDEPENDENT_AMBULATORY_CARE_PROVIDER_SITE_OTHER): Payer: Self-pay | Admitting: Internal Medicine

## 2024-09-17 VITALS — BP 132/84 | HR 67 | Temp 97.8°F | Ht 60.0 in | Wt 153.0 lb

## 2024-09-17 DIAGNOSIS — E88819 Insulin resistance, unspecified: Secondary | ICD-10-CM

## 2024-09-17 DIAGNOSIS — E66811 Obesity, class 1: Secondary | ICD-10-CM | POA: Diagnosis not present

## 2024-09-17 DIAGNOSIS — M816 Localized osteoporosis [Lequesne]: Secondary | ICD-10-CM | POA: Diagnosis not present

## 2024-09-17 DIAGNOSIS — I10 Essential (primary) hypertension: Secondary | ICD-10-CM

## 2024-09-17 DIAGNOSIS — Z6831 Body mass index (BMI) 31.0-31.9, adult: Secondary | ICD-10-CM

## 2024-09-17 MED ORDER — METFORMIN HCL ER 500 MG PO TB24: 500.0000 mg | ORAL_TABLET | Freq: Every day | ORAL | 0 refills | Status: AC

## 2024-09-17 NOTE — Progress Notes (Signed)
 "  Office: [phone]  /  Fax: [phone]  Weight Summary and Body Composition Analysis (BIA)  Vitals Temp: 97.8 F (36.6 C) BP: 132/84 Pulse Rate: 67 SpO2: 98 %   Anthropometric Measurements Height: 5' (1.524 m) Weight: 153 lb (69.4 kg) BMI (Calculated): 29.88 Weight at Last Visit: 155 lb Weight Lost Since Last Visit: 2 lb Weight Gained Since Last Visit: 0 lb Starting Weight: 163 lb Peak Weight: 165 lb   Body Composition  Body Fat %: 44 % Fat Mass (lbs): 67.6 lbs Muscle Mass (lbs): 81.6 lbs Total Body Water (lbs): 59.6 lbs Visceral Fat Rating : 12    RMR: 1325  Today's Visit #: 7  Starting Date: 02/09/24   Subjective   Chief Complaint: Obesity  Interval History  Discussed the use of AI scribe software for clinical note transcription with the patient, who gave verbal consent to proceed.  History of Present Illness Cheryl Barnes is a 74 year old female who presents for medical weight management.  Cheryl Barnes has successfully lost approximately 15 pounds over the past eight months, reducing her weight from 168 pounds in May to 153 pounds currently. Cheryl Barnes practices intermittent fasting, consuming two meals a day, and reports no appetite issues. Cheryl Barnes feels the best Cheryl Barnes has in ten years, with consistent energy levels and no hunger despite eating only twice a day.  Cheryl Barnes is currently taking metformin  once daily without gastrointestinal side effects such as loose stools. Cheryl Barnes is also on Azor  for blood pressure management, with a recent reading of 132/84 mmHg. No dizziness or lightheadedness. Her past medical history includes insulin  resistance, with previous lab work showing a normal A1c of 5.3 and normal fasting blood glucose levels, though her fasting insulin  levels were slightly elevated. Cheryl Barnes is scheduled for further lab work at the end of the month with her primary care doctor.  Cheryl Barnes has been participating in physical therapy at [facility] for her hip replacement, which  has improved her range of motion and daily activities such as getting in and out of the car and dressing herself.  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 Cheryl Barnes has a sister nearby who has a generator, providing a backup plan in case of power outages during winter storms.     Challenges affecting patient progress: none.    Pharmacotherapy for weight management: Cheryl Barnes is currently taking Metformin  (off label use for weight management and / or insulin  resistance and / or diabetes prevention) with adequate clinical response  and without side effects..   Assessment and Plan   Treatment Plan For Obesity:  Recommended Dietary Goals  Cheryl Barnes is currently in the action stage of change. As such, her goal is to continue weight management plan. Cheryl Barnes has agreed to: incorporate prepackaged healthy meals for convenience, incorporate 1-2 meal replacements a day for convenience , and continue current reduced-calorie meal plan  Behavioral Health and Counseling  We discussed the following behavioral modification strategies today: continue to work on maintaining a reduced calorie state, getting the recommended amount of protein, incorporating whole foods, making healthy choices, staying well hydrated and practicing mindfulness when eating. and increase protein intake, fibrous foods (25 grams per day for women, 30 grams for men) and water to improve satiety and decrease hunger signals. .  Additional education and resources provided today: None  Recommended Physical Activity Goals  Cheryl Barnes has been advised to work up to 150 minutes of moderate intensity  aerobic activity a week and strengthening exercises 2-3 times per week for cardiovascular health, weight loss maintenance and preservation of muscle mass.  Cheryl Barnes has agreed to :  Increase volume of physical activity to a goal of 240 minutes a week and Combine aerobic and strengthening exercises  for efficiency and improved cardiometabolic health.  Medical Interventions and Pharmacotherapy  We discussed various medication options to help Cheryl Barnes with her weight loss efforts and we both agreed to : Adequate clinical response to anti-obesity medication, continue current anti-obesity regimen  Associated Conditions Impacted by Obesity Treatment  Assessment & Plan Insulin  resistance  Essential (primary) hypertension  Class 1 obesity with serious comorbidity and body mass index (BMI) of 31.0 to 31.9 in adult, unspecified obesity type  Localized osteoporosis without current pathological fracture     Assessment and Plan Assessment & Plan Insulin  resistance Mild insulin  resistance with slightly elevated insulin  levels. A1c is 5.3, and fasting blood glucose is normal. Current management with metformin  is effective. - Continue metformin  once daily. - Requested primary care provider to check fasting insulin  levels during upcoming blood work.  Obesity Gradual weight loss over eight months with current weight at 153 pounds. BMI is 29. Current lifestyle modifications, including intermittent fasting and two meals a day, are effective. Emphasis on physical activity to boost metabolism and aid weight loss. - Continue current lifestyle modifications, including intermittent fasting and two meals a day. - Increase physical activity, focusing on weight-bearing exercises and walking. - Consider joining [facility] for a two-week trial after completing physical therapy. - Monitor weight and metabolic rate.  Osteoporosis Management includes physical therapy for hip replacement, which has improved range of motion and daily activities. Emphasis on weight-bearing exercises to strengthen bones and improve balance. - Continue physical therapy for hip replacement. - Engage in weight-bearing exercises such as walking with a weighted vest, chair strengthening exercises, and Tai Chi. - Consider joining  [facility] for access to a strengthening program and core exercises.   Vitals:   09/17/24 1400  BP: 132/84   Stable. Blood pressure control expected to improve with continued weight reduction. No medication changes indicated today; continue current antihypertensive regimen and home BP monitoring.       Objective   Physical Exam:  Blood pressure 132/84, pulse 67, temperature 97.8 F (36.6 C), height 5' (1.524 m), weight 153 lb (69.4 kg), SpO2 98%. Body mass index is 29.88 kg/m.  General: Cheryl Barnes is overweight, cooperative, alert, well developed, and in no acute distress. PSYCH: Has normal mood, affect and thought process.   HEENT: EOMI, sclerae are anicteric. Lungs: Normal breathing effort, no conversational dyspnea. Extremities: No edema.  Neurologic: No gross sensory or motor deficits. No tremors or fasciculations noted.    Diagnostic Data Reviewed:  BMET    Component Value Date/Time   NA 143 11/04/2023 1053   NA 142 11/25/2020 1023   K 3.8 11/04/2023 1053   CL 108 11/04/2023 1053   CO2 26 11/04/2023 1053   GLUCOSE 102 (H) 11/04/2023 1053   BUN 17 11/04/2023 1053   BUN 17 11/25/2020 1023   CREATININE 0.71 11/04/2023 1053   CALCIUM  9.7 11/04/2023 1053   GFRNONAA >60 05/22/2023 0654   GFRAA >60 05/21/2017 1834   Lab Results  Component Value Date   HGBA1C 5.3 11/04/2023   HGBA1C 5.5 12/19/2014   Lab Results  Component Value Date   INSULIN  17.2 02/10/2024   Lab Results  Component Value Date   TSH 0.58 10/24/2022   CBC  Component Value Date/Time   WBC 4.5 02/10/2024 0935   WBC 5.1 05/22/2023 0654   RBC 4.85 02/10/2024 0935   RBC 3.13 (L) 05/22/2023 0654   HGB 14.6 02/10/2024 0935   HCT 45.7 02/10/2024 0935   PLT 171 02/10/2024 0935   MCV 94 02/10/2024 0935   MCH 30.1 02/10/2024 0935   MCH 31.0 05/22/2023 0654   MCHC 31.9 02/10/2024 0935   MCHC 32.9 05/22/2023 0654   RDW 12.6 02/10/2024 0935   Iron Studies    Component Value Date/Time   IRON 78  02/10/2024 0935   TIBC 320 02/10/2024 0935   FERRITIN 141 02/10/2024 0935   IRONPCTSAT 24 02/10/2024 0935   Lipid Panel     Component Value Date/Time   CHOL 203 (H) 11/04/2023 1053   TRIG 147.0 11/04/2023 1053   HDL 57.00 11/04/2023 1053   CHOLHDL 4 11/04/2023 1053   VLDL 29.4 11/04/2023 1053   LDLCALC 117 (H) 11/04/2023 1053   LDLDIRECT 120.6 08/22/2011 0821   Hepatic Function Panel     Component Value Date/Time   PROT 7.7 11/04/2023 1053   ALBUMIN 4.8 11/04/2023 1053   AST 20 11/04/2023 1053   ALT 19 11/04/2023 1053   ALKPHOS 116 11/04/2023 1053   BILITOT 0.6 11/04/2023 1053   BILIDIR 0.1 08/29/2016 0928   IBILI NOT CALCULATED 09/30/2013 1427      Component Value Date/Time   TSH 0.58 10/24/2022 0839   Nutritional Lab Results  Component Value Date   VD25OH 32.6 02/10/2024    Medications: Outpatient Encounter Medications as of 09/17/2024  Medication Sig Note   amLODipine -olmesartan  (AZOR ) 5-40 MG tablet Take 1 tablet by mouth daily.    cetirizine (ZYRTEC) 10 MG tablet Take 10 mg by mouth every evening.    gabapentin  (NEURONTIN ) 100 MG capsule Take 1 capsule (100 mg total) by mouth 3 (three) times daily as needed.    valACYclovir  (VALTREX ) 1000 MG tablet Take 2 tablets by mouth as needed upon acute onset and asap repeat dose in 12 hours 05/22/2023: Patient states that pharmacy was told that Cheryl Barnes could not take this medication with her Oxycodone . Patient would like to know if this is a contradiction.   [DISCONTINUED] metFORMIN  (GLUCOPHAGE -XR) 500 MG 24 hr tablet Take 1 tablet (500 mg total) by mouth 2 (two) times daily with a meal.    metFORMIN  (GLUCOPHAGE -XR) 500 MG 24 hr tablet Take 1 tablet (500 mg total) by mouth daily with breakfast.    No facility-administered encounter medications on file as of 09/17/2024.     Follow-Up   Return in about 8 weeks (around 11/12/2024) for For Weight Mangement with Dr. Francyne.Cheryl Barnes Cheryl Barnes was informed of the importance of frequent follow  up visits to maximize her success with intensive lifestyle modifications for her multiple health conditions.  Attestation Statement   Reviewed by clinician on day of visit: allergies, medications, problem list, medical history, surgical history, family history, social history, and previous encounter notes.     Lucas Francyne, MD  "

## 2024-09-22 ENCOUNTER — Ambulatory Visit (HOSPITAL_BASED_OUTPATIENT_CLINIC_OR_DEPARTMENT_OTHER): Payer: Self-pay | Attending: Orthopaedic Surgery | Admitting: Physical Therapy

## 2024-09-22 DIAGNOSIS — M79604 Pain in right leg: Secondary | ICD-10-CM | POA: Insufficient documentation

## 2024-09-22 DIAGNOSIS — R262 Difficulty in walking, not elsewhere classified: Secondary | ICD-10-CM | POA: Insufficient documentation

## 2024-09-22 DIAGNOSIS — M6281 Muscle weakness (generalized): Secondary | ICD-10-CM | POA: Insufficient documentation

## 2024-09-25 ENCOUNTER — Ambulatory Visit
Admission: RE | Admit: 2024-09-25 | Discharge: 2024-09-25 | Disposition: A | Source: Ambulatory Visit | Attending: Family Medicine | Admitting: Family Medicine

## 2024-09-25 DIAGNOSIS — Z1231 Encounter for screening mammogram for malignant neoplasm of breast: Secondary | ICD-10-CM

## 2024-09-29 ENCOUNTER — Ambulatory Visit (HOSPITAL_BASED_OUTPATIENT_CLINIC_OR_DEPARTMENT_OTHER): Attending: Orthopaedic Surgery

## 2024-09-29 ENCOUNTER — Encounter (HOSPITAL_BASED_OUTPATIENT_CLINIC_OR_DEPARTMENT_OTHER): Payer: Self-pay

## 2024-09-29 DIAGNOSIS — R262 Difficulty in walking, not elsewhere classified: Secondary | ICD-10-CM

## 2024-09-29 DIAGNOSIS — M79604 Pain in right leg: Secondary | ICD-10-CM

## 2024-09-29 DIAGNOSIS — M6281 Muscle weakness (generalized): Secondary | ICD-10-CM

## 2024-09-29 NOTE — Addendum Note (Signed)
 Addended by: MARGRETTE MOSE RAMAN on: 09/29/2024 04:57 PM   Modules accepted: Orders

## 2024-09-30 ENCOUNTER — Other Ambulatory Visit: Payer: Self-pay | Admitting: Family Medicine

## 2024-09-30 DIAGNOSIS — R928 Other abnormal and inconclusive findings on diagnostic imaging of breast: Secondary | ICD-10-CM

## 2024-10-01 ENCOUNTER — Ambulatory Visit (HOSPITAL_BASED_OUTPATIENT_CLINIC_OR_DEPARTMENT_OTHER)

## 2024-10-01 ENCOUNTER — Encounter (HOSPITAL_BASED_OUTPATIENT_CLINIC_OR_DEPARTMENT_OTHER): Payer: Self-pay

## 2024-10-01 DIAGNOSIS — M79604 Pain in right leg: Secondary | ICD-10-CM

## 2024-10-01 DIAGNOSIS — R262 Difficulty in walking, not elsewhere classified: Secondary | ICD-10-CM

## 2024-10-01 DIAGNOSIS — M6281 Muscle weakness (generalized): Secondary | ICD-10-CM

## 2024-10-01 NOTE — Therapy (Signed)
 " OUTPATIENT PHYSICAL THERAPY LOWER EXTREMITY TREATMENT  Progress Note Reporting Period 08/18/2024  to 09/29/2024  See note below for Objective Data and Assessment of Progress/Goals.       Patient Name: Cheryl Barnes MRN: [id number] DOB:1950/09/03, 74 y.o., female Today's Date: 10/01/2024  END OF SESSION:  PT End of Session - 09/29/24 0935     Visit Number 20    Number of Visits 32   Date for Recertification  11/10/24    Authorization Type MCR A and B    Progress Note Due on Visit 30    PT Start Time 0930    PT Stop Time 1019    PT Time Calculation (min) 49 min    Activity Tolerance Patient tolerated treatment well    Behavior During Therapy WFL for tasks assessed/performed                       Past Medical History:  Diagnosis Date   Allergy    Arthritis    Back pain    Chest pain    Diverticulitis    High cholesterol    History of hiatal hernia    Hypertension    Joint pain    LBBB (left bundle branch block)    Migraine    history   Nonobstructive atherosclerosis of coronary artery    Osteoarthritis    Osteoarthritis, hip, bilateral    Past Surgical History:  Procedure Laterality Date   CESAREAN SECTION     x2   CHOLECYSTECTOMY     PARTIAL HYSTERECTOMY     Abdominal   ROTATOR CUFF REPAIR Left    ROTATOR CUFF REPAIR Right    TONSILLECTOMY     removed as a child   TOTAL HIP ARTHROPLASTY Right 05/13/2023   Procedure: RIGHT TOTAL HIP REPLACEMENT;  Surgeon: Jerri Kay HERO, MD;  Location: [facility] OR;  Service: Orthopedics;  Laterality: Right;  3-C   TYMPANOSTOMY TUBE PLACEMENT Right 2022   Patient Active Problem List   Diagnosis Date Noted   Abnormal food appetite 06/09/2024   Osteoporosis 05/16/2024   Insulin  resistance 03/03/2024   At increased risk for cardiovascular disease 03/03/2024   Iron deficiency anemia 02/10/2024   Abnormal glucose 02/10/2024   Statin intolerance - tried several in past, 2-3 years ago 12/16/2023   Tinnitus of  both ears 11/04/2023   Status post total replacement of right hip 05/13/2023   Primary osteoarthritis of right hip 05/12/2023   Primary osteoarthritis of left hip 02/06/2023   Statin myopathy 10/24/2022   Atherosclerosis of aorta 10/17/2020   Nocturnal leg cramps 10/01/2018   Splenic artery aneurysm 03/01/2017   Thyroid  nodule 01/27/2017   Acute diverticulitis 01/24/2017   Recurrent herpes labialis 09/21/2016   Pure hypercholesterolemia 09/05/2016   Class 1 obesity with serious comorbidity and body mass index (BMI) of 31.0 to 31.9 in adult 09/05/2016   Bilateral hip pain 09/14/2015   Medicare annual wellness visit, subsequent 09/14/2015   Sigmoid diverticulitis 01/27/2015   LBBB (left bundle branch block) 12/23/2014   Hematochezia 12/23/2014   Abdominal pain    Diverticulitis of large intestine without perforation or abscess without bleeding    Chest pain    Solitary pulmonary nodule 06/09/2014   Left lower lobe pneumonia 10/06/2013   Urinary incontinence in female 03/02/2013   Postmenopause atrophic vaginitis 03/02/2013   RUQ PAIN 04/01/2007   Essential (primary) hypertension 03/24/2007   Diverticulosis of colon 03/17/2007     REFERRING PROVIDER:  Jerri Kay HERO, MD   REFERRING DIAG: [phone] (ICD-10-CM) - Status post total replacement of right hip   THERAPY DIAG:  Pain in right leg  Muscle weakness (generalized)  Difficulty in walking, not elsewhere classified  Rationale for Evaluation and Treatment: Rehabilitation  ONSET DATE: DOS  05/13/2023  SUBJECTIVE:   SUBJECTIVE STATEMENT:  Pt reports she has not attended PT since December due to not being able to get an appointment. She reports the cold weather hasn't helped. Her hip feels stiff and heavy. Mild increase in pain with movement as well. Theragun at home has been helping.    PERTINENT HISTORY: R THA anterior approach on 05/13/23 Osteoporosis OA including L hip Bilat RCR, HTN  PAIN:  4-5/10 pain in R  hip/groin and thigh      PRECAUTIONS: Other: R THA with anterior approach, osteoporosis   WEIGHT BEARING RESTRICTIONS: No  FALLS:  Has patient fallen in last 6 months? Yes. Number of falls 2.  Trying to stop a soccer ball and bending over trying to move a rug  LIVING ENVIRONMENT: Lives with: lives alone Lives in: 1 story home Stairs: 4 steps to enter home with rails    PLOF: Independent  PATIENT GOALS: improved performance of car transfers and stairs.  To be able to perform stairs normally.  Improve strength.    OBJECTIVE:  Note: Objective measures were completed at Evaluation unless otherwise noted.  DIAGNOSTIC FINDINGS: X rays of R hip:  stable R THR without complication.  PATIENT SURVEYS:  LEFS:  14/80 (Initial) LEFS:   41/80 on 07/07/24  09/29/34: LEFS: 39/80 COGNITION: Overall cognitive status: Within functional limits for tasks assessed      PALPATION: TTP:  R LE:  lateral thigh/ ITB,  mid to distal central thigh   LOWER EXTREMITY ROM:  Active ROM Right eval Left eval Right 11/11 R 12/23 R 2/3  Hip flexion       Hip extension       Hip abduction       Hip adduction       Hip internal rotation       Hip external rotation       Knee flexion 106 122 116 116 P! 131 passive 95 active due to pain  Knee extension       Ankle dorsiflexion       Ankle plantarflexion       Ankle inversion       Ankle eversion        (Blank rows = not tested)  LOWER EXTREMITY MMT:  MMT Right eval Left eval Right 11/11 R 2/3 L 2/3  Hip flexion Tolerated min resistance With pain; 19.8 5/5; 25.2 5/5 24.7 46.3 55.0  Hip extension       Hip abduction 11.2 29.2 21.4  6.0 (pain) sidelying  Hip adduction       Hip internal rotation       Hip external rotation       Knee flexion 4+/5 with pain 5/5 5/5    Knee extension 4-/5 with pain 5/5 4/5 17.8 31.5  Ankle dorsiflexion       Ankle plantarflexion       Ankle inversion       Ankle eversion        (Blank rows = not  tested)   FUNCTIONAL TESTS:  5x STS test:  15.76 sec (initial) 5x STS test:  07/07/24:  10.11 sec  GAIT:  Comments:  Pt has a heel to  toe gait pattern.  Improved quality of gait with reduced limp.  Pt favors R LE though had no significant limp.                                                                                                                                 TREATMENT:   10/01/24: Nu step L4 x67min STM to R hip flexors SAQ 2.5# 5 3x10 BKFO 2x10 PPT 5 2x10 Bridge 2x10 S/l clam 2x10   09/29/24 Nu step L4 x50min LEFS 39/80 IASTM and STM to hip flexor/quad Updated ROM/strength Goal review HEP update S/l clam 2x10 Piriformis stretching bil  HS stretching Hooklying clam GTB 2x10 Glute isometric hooklying 5 x20 Gait in hall [id number]    PATIENT EDUCATION:  Education details: dx, exercise form, relevant anatomy, HEP, POC, and rationale of interventions.  PT answered pt's questions.   Person educated: Patient Education method: Explanation, Demonstration, Tactile cues, Verbal cues Education comprehension: verbalized understanding, returned demonstration, verbal cues required, tactile cues required, and needs further education  HOME EXERCISE PROGRAM:  Access Code: [email or web address] URL: [email or web address] Date: 06/24/2024 Prepared by: Josette Rough     ASSESSMENT:  CLINICAL IMPRESSION:  Pt remains tender to anterior hip mm, continued to work on STM to the area to decrease tightness. Felt mild fatigue with s/l clam, but no pain. Able to tolerate light resistance with SAQ without discomfort. She did feel like her leg wanted to spasm with this, but with rest breaks she did better. Good tolerance for [facility] with cuing provided for core and gluteal mm. Will continue to progress as tolerated.    OBJECTIVE IMPAIRMENTS: Abnormal gait, decreased activity tolerance, decreased endurance, decreased mobility, difficulty walking, decreased ROM, decreased strength,  and pain.   ACTIVITY LIMITATIONS: carrying, squatting, stairs, transfers, and locomotion level  PARTICIPATION LIMITATIONS: cleaning, shopping, and community activity  PERSONAL FACTORS: 1-2 comorbidities: osteoporosis, L hip OA are also affecting patient's functional outcome.   REHAB POTENTIAL: Good  CLINICAL DECISION MAKING: Stable/uncomplicated  EVALUATION COMPLEXITY: Low   GOALS:  SHORT TERM GOALS: Target date: 06/15/24  Pt will be independent and compliant with HEP for improved pain, strength, and function.  Baseline: Goal status: MET 06/24/24  2.  Pt will report at least a 25% improvement in pain and sx's overall.   Baseline:  Goal status: MET 06/24/24  3.  Pt will report at least a 50% improvement in lifting her LE for improved dressing and performance of car transfers.  Baseline:  Goal status:  GOAL MET  11/11     LONG TERM GOALS: Target date: 11/10/2024   Pt will ambulate without limping.  Baseline:  Goal status: PROGRESSING 2/3  2.  Pt will be able to perform stairs with a  reciprocal gait with a rail.  Baseline:  Goal status: Progressing 2/3  3.  Pt will be able to perform car transfers without difficulty.  Baseline:  Goal status:  PROGRESSING 2/3  4.  Pt will report she is able to ambulate community distance without difficulty and increased pain.  Baseline:  Goal status: ONGOING 2/3  5.  Pt will demo improved R knee strength to 5/5 MMT and at least a 10# increase in R hip abd for improved performance of and tolerance with functional mobility.  Baseline:  Goal status: 50% MET    PLAN:  PT FREQUENCY: 2x/week  PT DURATION:  6 weeks  PLANNED INTERVENTIONS: [id number]- PT Re-evaluation, 97750- Physical Performance Testing, 97110-Therapeutic exercises, 97530- Therapeutic activity, [id number]- Neuromuscular re-education, 97535- Self Care, [id number]- Manual therapy, [id number]- Gait training, [phone]- Aquatic Therapy, [id number]- Electrical stimulation (unattended), 20560 (1-2  muscles), 20561 (3+ muscles)- Dry Needling, Patient/Family education, Balance training, Stair training, Taping, Cryotherapy, and Moist heat  PLAN FOR NEXT SESSION: IASTYM to anterior hip and global strengthening.    Valleri Hendricksen Bob Eastwood, PTA 10/01/2024, 10:17 AM                "

## 2024-10-02 ENCOUNTER — Encounter: Payer: Self-pay | Admitting: Family Medicine

## 2024-10-06 ENCOUNTER — Ambulatory Visit (HOSPITAL_BASED_OUTPATIENT_CLINIC_OR_DEPARTMENT_OTHER): Admitting: Physical Therapy

## 2024-10-08 ENCOUNTER — Encounter (HOSPITAL_BASED_OUTPATIENT_CLINIC_OR_DEPARTMENT_OTHER)

## 2024-10-13 ENCOUNTER — Other Ambulatory Visit

## 2024-10-13 ENCOUNTER — Encounter

## 2024-10-13 ENCOUNTER — Encounter (HOSPITAL_BASED_OUTPATIENT_CLINIC_OR_DEPARTMENT_OTHER): Admitting: Physical Therapy

## 2024-10-15 ENCOUNTER — Encounter (HOSPITAL_BASED_OUTPATIENT_CLINIC_OR_DEPARTMENT_OTHER)

## 2024-10-20 ENCOUNTER — Encounter (HOSPITAL_BASED_OUTPATIENT_CLINIC_OR_DEPARTMENT_OTHER): Admitting: Physical Therapy

## 2024-10-22 ENCOUNTER — Encounter (HOSPITAL_BASED_OUTPATIENT_CLINIC_OR_DEPARTMENT_OTHER): Admitting: Physical Therapy

## 2024-10-27 ENCOUNTER — Other Ambulatory Visit

## 2024-10-28 ENCOUNTER — Encounter (HOSPITAL_BASED_OUTPATIENT_CLINIC_OR_DEPARTMENT_OTHER)

## 2024-10-30 ENCOUNTER — Encounter (HOSPITAL_BASED_OUTPATIENT_CLINIC_OR_DEPARTMENT_OTHER)

## 2024-10-30 ENCOUNTER — Other Ambulatory Visit

## 2024-11-02 ENCOUNTER — Encounter (HOSPITAL_BASED_OUTPATIENT_CLINIC_OR_DEPARTMENT_OTHER)

## 2024-11-03 ENCOUNTER — Encounter: Admitting: Family Medicine

## 2024-11-03 ENCOUNTER — Encounter (HOSPITAL_BASED_OUTPATIENT_CLINIC_OR_DEPARTMENT_OTHER)

## 2024-11-03 ENCOUNTER — Other Ambulatory Visit

## 2024-11-05 ENCOUNTER — Encounter (HOSPITAL_BASED_OUTPATIENT_CLINIC_OR_DEPARTMENT_OTHER)

## 2024-11-10 ENCOUNTER — Encounter (HOSPITAL_BASED_OUTPATIENT_CLINIC_OR_DEPARTMENT_OTHER)

## 2024-11-10 ENCOUNTER — Encounter: Admitting: Family Medicine

## 2024-11-12 ENCOUNTER — Encounter (HOSPITAL_BASED_OUTPATIENT_CLINIC_OR_DEPARTMENT_OTHER): Admitting: Physical Therapy

## 2024-11-16 ENCOUNTER — Encounter (HOSPITAL_BASED_OUTPATIENT_CLINIC_OR_DEPARTMENT_OTHER)

## 2024-11-17 ENCOUNTER — Encounter (HOSPITAL_BASED_OUTPATIENT_CLINIC_OR_DEPARTMENT_OTHER)

## 2024-11-17 ENCOUNTER — Ambulatory Visit (INDEPENDENT_AMBULATORY_CARE_PROVIDER_SITE_OTHER): Admitting: Internal Medicine

## 2024-11-19 ENCOUNTER — Encounter (HOSPITAL_BASED_OUTPATIENT_CLINIC_OR_DEPARTMENT_OTHER): Admitting: Physical Therapy

## 2024-11-24 ENCOUNTER — Encounter (HOSPITAL_BASED_OUTPATIENT_CLINIC_OR_DEPARTMENT_OTHER): Admitting: Physical Therapy
